# Patient Record
Sex: Female | Born: 1945
Health system: Southern US, Community
[De-identification: ages and names within clinical notes are randomized; demographics above are authoritative.]

## PROBLEM LIST (undated history)

## (undated) DIAGNOSIS — T7840XA Allergy, unspecified, initial encounter: Secondary | ICD-10-CM

## (undated) DIAGNOSIS — T783XXA Angioneurotic edema, initial encounter: Secondary | ICD-10-CM

## (undated) DIAGNOSIS — E039 Hypothyroidism, unspecified: Secondary | ICD-10-CM

## (undated) DIAGNOSIS — C499 Malignant neoplasm of connective and soft tissue, unspecified: Secondary | ICD-10-CM

## (undated) DIAGNOSIS — E785 Hyperlipidemia, unspecified: Secondary | ICD-10-CM

## (undated) DIAGNOSIS — M1990 Unspecified osteoarthritis, unspecified site: Secondary | ICD-10-CM

## (undated) DIAGNOSIS — H269 Unspecified cataract: Secondary | ICD-10-CM

## (undated) DIAGNOSIS — L509 Urticaria, unspecified: Secondary | ICD-10-CM

## (undated) DIAGNOSIS — I10 Essential (primary) hypertension: Secondary | ICD-10-CM

## (undated) DIAGNOSIS — J45909 Unspecified asthma, uncomplicated: Secondary | ICD-10-CM

## (undated) DIAGNOSIS — C4492 Squamous cell carcinoma of skin, unspecified: Secondary | ICD-10-CM

## (undated) HISTORY — DX: Malignant neoplasm of connective and soft tissue, unspecified: C49.9

## (undated) HISTORY — DX: Urticaria, unspecified: L50.9

## (undated) HISTORY — DX: Squamous cell carcinoma of skin, unspecified: C44.92

## (undated) HISTORY — DX: Hyperlipidemia, unspecified: E78.5

## (undated) HISTORY — PX: BREAST LUMPECTOMY: SHX2

## (undated) HISTORY — DX: Hypothyroidism, unspecified: E03.9

## (undated) HISTORY — PX: ABDOMINAL HYSTERECTOMY: SHX81

## (undated) HISTORY — DX: Allergy, unspecified, initial encounter: T78.40XA

## (undated) HISTORY — DX: Unspecified osteoarthritis, unspecified site: M19.90

## (undated) HISTORY — DX: Unspecified cataract: H26.9

## (undated) HISTORY — PX: SQUAMOUS CELL CARCINOMA EXCISION: SHX2433

## (undated) HISTORY — DX: Unspecified asthma, uncomplicated: J45.909

## (undated) HISTORY — PX: TONSILLECTOMY: SUR1361

## (undated) HISTORY — DX: Angioneurotic edema, initial encounter: T78.3XXA

## (undated) HISTORY — DX: Essential (primary) hypertension: I10

---

## 1970-03-06 DIAGNOSIS — C499 Malignant neoplasm of connective and soft tissue, unspecified: Secondary | ICD-10-CM

## 1970-03-06 HISTORY — DX: Malignant neoplasm of connective and soft tissue, unspecified: C49.9

## 1971-03-07 HISTORY — PX: OTHER SURGICAL HISTORY: SHX169

## 1998-07-20 ENCOUNTER — Inpatient Hospital Stay (HOSPITAL_COMMUNITY): Admission: EM | Admit: 1998-07-20 | Discharge: 1998-07-21 | Payer: Self-pay | Admitting: Emergency Medicine

## 1998-07-20 ENCOUNTER — Encounter: Payer: Self-pay | Admitting: Emergency Medicine

## 2001-02-20 ENCOUNTER — Ambulatory Visit (HOSPITAL_COMMUNITY): Admission: RE | Admit: 2001-02-20 | Discharge: 2001-02-20 | Payer: Self-pay | Admitting: Gastroenterology

## 2005-11-30 ENCOUNTER — Ambulatory Visit: Payer: Self-pay | Admitting: Family Medicine

## 2006-01-17 ENCOUNTER — Ambulatory Visit: Payer: Self-pay | Admitting: Family Medicine

## 2006-03-01 ENCOUNTER — Ambulatory Visit: Payer: Self-pay | Admitting: Family Medicine

## 2006-03-01 LAB — CONVERTED CEMR LAB
ALT: 27 units/L (ref 0–40)
AST: 29 units/L (ref 0–37)
BUN: 10 mg/dL (ref 6–23)
CO2: 31 meq/L (ref 19–32)
Calcium: 9.4 mg/dL (ref 8.4–10.5)
Chloride: 103 meq/L (ref 96–112)
Chol/HDL Ratio, serum: 2.7
Cholesterol: 180 mg/dL (ref 0–200)
Creatinine, Ser: 0.9 mg/dL (ref 0.4–1.2)
GFR calc non Af Amer: 68 mL/min
Glomerular Filtration Rate, Af Am: 82 mL/min/{1.73_m2}
Glucose, Bld: 94 mg/dL (ref 70–99)
HDL: 66.1 mg/dL (ref >39.0)
LDL Cholesterol: 98 mg/dL (ref 0–99)
Potassium: 3.9 meq/L (ref 3.5–5.1)
Sodium: 140 meq/L (ref 135–145)
TSH: 0.72 microintl units/mL (ref 0.35–5.50)
Triglyceride fasting, serum: 78 mg/dL (ref 0–149)
VLDL: 16 mg/dL (ref 0–40)

## 2006-06-07 ENCOUNTER — Encounter: Admission: RE | Admit: 2006-06-07 | Discharge: 2006-06-07 | Payer: Self-pay | Admitting: Family Medicine

## 2006-06-07 ENCOUNTER — Ambulatory Visit: Payer: Self-pay | Admitting: Family Medicine

## 2006-07-02 ENCOUNTER — Ambulatory Visit: Payer: Self-pay | Admitting: Family Medicine

## 2006-07-05 ENCOUNTER — Ambulatory Visit: Payer: Self-pay | Admitting: Family Medicine

## 2006-07-05 LAB — CONVERTED CEMR LAB
ALT: 17 units/L (ref 0–40)
AST: 23 units/L (ref 0–37)
BUN: 17 mg/dL (ref 6–23)
CO2: 31 meq/L (ref 19–32)
Calcium: 9.4 mg/dL (ref 8.4–10.5)
Chloride: 104 meq/L (ref 96–112)
Cholesterol: 161 mg/dL (ref 0–200)
Creatinine, Ser: 1 mg/dL (ref 0.4–1.2)
GFR calc Af Amer: 73 mL/min
GFR calc non Af Amer: 60 mL/min
Glucose, Bld: 89 mg/dL (ref 70–99)
HDL: 58.4 mg/dL (ref >39.0)
LDL Cholesterol: 90 mg/dL (ref 0–99)
Potassium: 3.7 meq/L (ref 3.5–5.1)
Sodium: 142 meq/L (ref 135–145)
Total CHOL/HDL Ratio: 2.8
Triglycerides: 64 mg/dL (ref 0–149)
VLDL: 13 mg/dL (ref 0–40)

## 2006-08-01 DIAGNOSIS — E039 Hypothyroidism, unspecified: Secondary | ICD-10-CM | POA: Insufficient documentation

## 2006-08-01 DIAGNOSIS — I10 Essential (primary) hypertension: Secondary | ICD-10-CM | POA: Insufficient documentation

## 2006-08-01 DIAGNOSIS — M810 Age-related osteoporosis without current pathological fracture: Secondary | ICD-10-CM | POA: Insufficient documentation

## 2006-08-01 DIAGNOSIS — E785 Hyperlipidemia, unspecified: Secondary | ICD-10-CM | POA: Insufficient documentation

## 2006-09-03 ENCOUNTER — Ambulatory Visit (HOSPITAL_BASED_OUTPATIENT_CLINIC_OR_DEPARTMENT_OTHER): Admission: RE | Admit: 2006-09-03 | Discharge: 2006-09-03 | Payer: Self-pay | Admitting: Surgery

## 2006-09-03 ENCOUNTER — Encounter (INDEPENDENT_AMBULATORY_CARE_PROVIDER_SITE_OTHER): Payer: Self-pay | Admitting: Surgery

## 2006-09-25 ENCOUNTER — Encounter (INDEPENDENT_AMBULATORY_CARE_PROVIDER_SITE_OTHER): Payer: Self-pay | Admitting: Family Medicine

## 2006-11-19 ENCOUNTER — Ambulatory Visit: Payer: Self-pay | Admitting: Family Medicine

## 2006-11-19 ENCOUNTER — Telehealth (INDEPENDENT_AMBULATORY_CARE_PROVIDER_SITE_OTHER): Payer: Self-pay | Admitting: *Deleted

## 2006-11-19 LAB — CONVERTED CEMR LAB
ALT: 21 units/L (ref 0–35)
AST: 23 units/L (ref 0–37)
BUN: 14 mg/dL (ref 6–23)
Basophils Absolute: 0 10*3/uL (ref 0.0–0.1)
Basophils Relative: 0.5 % (ref 0.0–1.0)
CO2: 32 meq/L (ref 19–32)
Calcium: 9.3 mg/dL (ref 8.4–10.5)
Chloride: 107 meq/L (ref 96–112)
Cholesterol: 160 mg/dL (ref 0–200)
Creatinine, Ser: 0.8 mg/dL (ref 0.4–1.2)
Eosinophils Absolute: 0.1 10*3/uL (ref 0.0–0.6)
Eosinophils Relative: 1.9 % (ref 0.0–5.0)
GFR calc Af Amer: 94 mL/min
GFR calc non Af Amer: 78 mL/min
Glucose, Bld: 93 mg/dL (ref 70–99)
HCT: 38.9 % (ref 36.0–46.0)
HDL: 56.5 mg/dL (ref >39.0)
Hemoglobin: 13.1 g/dL (ref 12.0–15.0)
LDL Cholesterol: 94 mg/dL (ref 0–99)
Lymphocytes Relative: 27.5 % (ref 12.0–46.0)
MCHC: 33.7 g/dL (ref 30.0–36.0)
MCV: 91 fL (ref 78.0–100.0)
Monocytes Absolute: 0.4 10*3/uL (ref 0.2–0.7)
Monocytes Relative: 6.3 % (ref 3.0–11.0)
Neutro Abs: 4.5 10*3/uL (ref 1.4–7.7)
Neutrophils Relative %: 63.8 % (ref 43.0–77.0)
Platelets: 246 10*3/uL (ref 150–400)
Potassium: 3.9 meq/L (ref 3.5–5.1)
RBC: 4.27 M/uL (ref 3.87–5.11)
RDW: 11.9 % (ref 11.5–14.6)
Sodium: 143 meq/L (ref 135–145)
TSH: 0.53 microintl units/mL (ref 0.35–5.50)
Total CHOL/HDL Ratio: 2.8
Triglycerides: 47 mg/dL (ref 0–149)
VLDL: 9 mg/dL (ref 0–40)
WBC: 6.9 10*3/uL (ref 4.5–10.5)

## 2006-11-28 ENCOUNTER — Ambulatory Visit: Payer: Self-pay | Admitting: Family Medicine

## 2006-11-29 ENCOUNTER — Telehealth (INDEPENDENT_AMBULATORY_CARE_PROVIDER_SITE_OTHER): Payer: Self-pay | Admitting: *Deleted

## 2006-12-05 ENCOUNTER — Encounter (INDEPENDENT_AMBULATORY_CARE_PROVIDER_SITE_OTHER): Payer: Self-pay | Admitting: Family Medicine

## 2007-01-07 ENCOUNTER — Ambulatory Visit: Payer: Self-pay | Admitting: Family Medicine

## 2007-02-07 ENCOUNTER — Ambulatory Visit: Payer: Self-pay | Admitting: Family Medicine

## 2007-02-25 ENCOUNTER — Ambulatory Visit: Payer: Self-pay | Admitting: Family Medicine

## 2007-02-25 LAB — CONVERTED CEMR LAB
ALT: 22 U/L
AST: 26 U/L
BUN: 12 mg/dL
CO2: 32 meq/L
Calcium: 9.7 mg/dL
Chloride: 102 meq/L
Creatinine, Ser: 0.8 mg/dL
GFR calc Af Amer: 94 mL/min
GFR calc non Af Amer: 78 mL/min
Glucose, Bld: 94 mg/dL
Potassium: 5.2 meq/L — ABNORMAL HIGH
Sodium: 143 meq/L
TSH: 0.58 u[IU]/mL

## 2007-06-10 ENCOUNTER — Telehealth (INDEPENDENT_AMBULATORY_CARE_PROVIDER_SITE_OTHER): Payer: Self-pay | Admitting: *Deleted

## 2007-06-27 ENCOUNTER — Ambulatory Visit: Payer: Self-pay | Admitting: Family Medicine

## 2007-06-27 DIAGNOSIS — D492 Neoplasm of unspecified behavior of bone, soft tissue, and skin: Secondary | ICD-10-CM | POA: Insufficient documentation

## 2007-07-16 ENCOUNTER — Encounter: Payer: Self-pay | Admitting: Family Medicine

## 2007-08-27 ENCOUNTER — Ambulatory Visit: Payer: Self-pay | Admitting: Family Medicine

## 2007-08-28 LAB — CONVERTED CEMR LAB
ALT: 24 units/L (ref 0–35)
AST: 29 units/L (ref 0–37)
Albumin: 4.4 g/dL (ref 3.5–5.2)
Alkaline Phosphatase: 47 units/L (ref 39–117)
BUN: 14 mg/dL (ref 6–23)
Bilirubin, Direct: 0.1 mg/dL (ref 0.0–0.3)
CO2: 31 meq/L (ref 19–32)
CRP, High Sensitivity: 1 (ref 0.00–5.00)
Calcium: 9.2 mg/dL (ref 8.4–10.5)
Chloride: 101 meq/L (ref 96–112)
Cholesterol: 167 mg/dL (ref 0–200)
Creatinine, Ser: 0.9 mg/dL (ref 0.4–1.2)
GFR calc Af Amer: 82 mL/min
GFR calc non Af Amer: 68 mL/min
Glucose, Bld: 85 mg/dL (ref 70–99)
HDL: 61.7 mg/dL (ref >39.0)
LDL Cholesterol: 92 mg/dL (ref 0–99)
Potassium: 3.6 meq/L (ref 3.5–5.1)
Sodium: 141 meq/L (ref 135–145)
TSH: 0.43 microintl units/mL (ref 0.35–5.50)
Total Bilirubin: 1.2 mg/dL (ref 0.3–1.2)
Total CHOL/HDL Ratio: 2.7
Total Protein: 7.1 g/dL (ref 6.0–8.3)
Triglycerides: 65 mg/dL (ref 0–149)
VLDL: 13 mg/dL (ref 0–40)

## 2007-08-29 ENCOUNTER — Encounter (INDEPENDENT_AMBULATORY_CARE_PROVIDER_SITE_OTHER): Payer: Self-pay | Admitting: *Deleted

## 2007-12-06 ENCOUNTER — Encounter: Payer: Self-pay | Admitting: Family Medicine

## 2007-12-26 ENCOUNTER — Encounter: Payer: Self-pay | Admitting: Family Medicine

## 2007-12-27 ENCOUNTER — Encounter: Payer: Self-pay | Admitting: Family Medicine

## 2007-12-27 ENCOUNTER — Other Ambulatory Visit: Admission: RE | Admit: 2007-12-27 | Discharge: 2007-12-27 | Payer: Self-pay | Admitting: Family Medicine

## 2007-12-27 ENCOUNTER — Ambulatory Visit: Payer: Self-pay | Admitting: Family Medicine

## 2007-12-30 LAB — CONVERTED CEMR LAB
ALT: 19 units/L (ref 0–35)
AST: 27 units/L (ref 0–37)
Albumin: 4.2 g/dL (ref 3.5–5.2)
Alkaline Phosphatase: 47 units/L (ref 39–117)
BUN: 14 mg/dL (ref 6–23)
Basophils Absolute: 0 10*3/uL (ref 0.0–0.1)
Basophils Relative: 0.4 % (ref 0.0–3.0)
Bilirubin, Direct: 0.1 mg/dL (ref 0.0–0.3)
CO2: 33 meq/L — ABNORMAL HIGH (ref 19–32)
Calcium: 9.2 mg/dL (ref 8.4–10.5)
Chloride: 102 meq/L (ref 96–112)
Cholesterol: 165 mg/dL (ref 0–200)
Creatinine, Ser: 0.8 mg/dL (ref 0.4–1.2)
Eosinophils Absolute: 0 10*3/uL (ref 0.0–0.7)
Eosinophils Relative: 0.5 % (ref 0.0–5.0)
Free T4: 1.1 ng/dL (ref 0.6–1.6)
GFR calc Af Amer: 93 mL/min
GFR calc non Af Amer: 77 mL/min
Glucose, Bld: 92 mg/dL (ref 70–99)
HCT: 40.2 % (ref 36.0–46.0)
HDL: 54.7 mg/dL (ref >39.0)
Hemoglobin: 14.1 g/dL (ref 12.0–15.0)
LDL Cholesterol: 95 mg/dL (ref 0–99)
Lymphocytes Relative: 25.9 % (ref 12.0–46.0)
MCHC: 35.1 g/dL (ref 30.0–36.0)
MCV: 91.8 fL (ref 78.0–100.0)
Monocytes Absolute: 0.5 10*3/uL (ref 0.1–1.0)
Monocytes Relative: 6.4 % (ref 3.0–12.0)
Neutro Abs: 5.3 10*3/uL (ref 1.4–7.7)
Neutrophils Relative %: 66.8 % (ref 43.0–77.0)
Platelets: 264 10*3/uL (ref 150–400)
Potassium: 4 meq/L (ref 3.5–5.1)
RBC: 4.38 M/uL (ref 3.87–5.11)
RDW: 12.3 % (ref 11.5–14.6)
Sodium: 142 meq/L (ref 135–145)
T3, Free: 3.2 pg/mL (ref 2.3–4.2)
TSH: 0.44 microintl units/mL (ref 0.35–5.50)
Total Bilirubin: 1 mg/dL (ref 0.3–1.2)
Total CHOL/HDL Ratio: 3
Total Protein: 7.2 g/dL (ref 6.0–8.3)
Triglycerides: 76 mg/dL (ref 0–149)
VLDL: 15 mg/dL (ref 0–40)
Vit D, 1,25-Dihydroxy: 36 (ref 30–89)
WBC: 7.8 10*3/uL (ref 4.5–10.5)

## 2007-12-31 ENCOUNTER — Encounter (INDEPENDENT_AMBULATORY_CARE_PROVIDER_SITE_OTHER): Payer: Self-pay | Admitting: *Deleted

## 2008-01-02 ENCOUNTER — Encounter (INDEPENDENT_AMBULATORY_CARE_PROVIDER_SITE_OTHER): Payer: Self-pay | Admitting: *Deleted

## 2008-01-09 ENCOUNTER — Telehealth: Payer: Self-pay | Admitting: Family Medicine

## 2008-01-15 ENCOUNTER — Encounter: Payer: Self-pay | Admitting: Family Medicine

## 2008-01-24 ENCOUNTER — Telehealth (INDEPENDENT_AMBULATORY_CARE_PROVIDER_SITE_OTHER): Payer: Self-pay | Admitting: *Deleted

## 2008-01-31 ENCOUNTER — Ambulatory Visit: Payer: Self-pay | Admitting: Family Medicine

## 2008-02-07 ENCOUNTER — Encounter: Payer: Self-pay | Admitting: Family Medicine

## 2008-02-12 ENCOUNTER — Telehealth (INDEPENDENT_AMBULATORY_CARE_PROVIDER_SITE_OTHER): Payer: Self-pay | Admitting: *Deleted

## 2008-02-17 ENCOUNTER — Ambulatory Visit: Payer: Self-pay | Admitting: Family Medicine

## 2008-02-17 LAB — CONVERTED CEMR LAB
Calcium: 9.5 mg/dL (ref 8.4–10.5)
Creatinine, Ser: 0.8 mg/dL (ref 0.4–1.2)
Magnesium: 2.2 mg/dL (ref 1.5–2.5)
Phosphorus: 4.2 mg/dL (ref 2.3–4.6)

## 2008-02-18 ENCOUNTER — Encounter: Payer: Self-pay | Admitting: Family Medicine

## 2008-03-05 ENCOUNTER — Ambulatory Visit (HOSPITAL_COMMUNITY): Admission: RE | Admit: 2008-03-05 | Discharge: 2008-03-05 | Payer: Self-pay | Admitting: Family Medicine

## 2008-03-24 ENCOUNTER — Telehealth (INDEPENDENT_AMBULATORY_CARE_PROVIDER_SITE_OTHER): Payer: Self-pay | Admitting: *Deleted

## 2008-03-25 ENCOUNTER — Telehealth (INDEPENDENT_AMBULATORY_CARE_PROVIDER_SITE_OTHER): Payer: Self-pay | Admitting: *Deleted

## 2008-04-06 ENCOUNTER — Encounter (INDEPENDENT_AMBULATORY_CARE_PROVIDER_SITE_OTHER): Payer: Self-pay | Admitting: *Deleted

## 2008-06-29 ENCOUNTER — Ambulatory Visit: Payer: Self-pay | Admitting: Family Medicine

## 2008-06-30 ENCOUNTER — Encounter (INDEPENDENT_AMBULATORY_CARE_PROVIDER_SITE_OTHER): Payer: Self-pay | Admitting: *Deleted

## 2008-06-30 LAB — CONVERTED CEMR LAB
ALT: 21 units/L (ref 0–35)
AST: 24 units/L (ref 0–37)
Albumin: 3.9 g/dL (ref 3.5–5.2)
Calcium: 9.2 mg/dL (ref 8.4–10.5)
Cholesterol: 200 mg/dL (ref 0–200)
GFR calc non Af Amer: 67.25 mL/min (ref >60)
HDL: 60.9 mg/dL (ref >39.00)
Sodium: 144 meq/L (ref 135–145)
Triglycerides: 85 mg/dL (ref 0.0–149.0)

## 2008-07-04 ENCOUNTER — Encounter: Payer: Self-pay | Admitting: Family Medicine

## 2008-07-06 ENCOUNTER — Telehealth (INDEPENDENT_AMBULATORY_CARE_PROVIDER_SITE_OTHER): Payer: Self-pay | Admitting: *Deleted

## 2008-09-21 ENCOUNTER — Telehealth (INDEPENDENT_AMBULATORY_CARE_PROVIDER_SITE_OTHER): Payer: Self-pay | Admitting: *Deleted

## 2008-12-07 ENCOUNTER — Encounter: Payer: Self-pay | Admitting: Family Medicine

## 2008-12-28 ENCOUNTER — Ambulatory Visit: Payer: Self-pay | Admitting: Family Medicine

## 2008-12-31 ENCOUNTER — Encounter (INDEPENDENT_AMBULATORY_CARE_PROVIDER_SITE_OTHER): Payer: Self-pay | Admitting: *Deleted

## 2008-12-31 LAB — CONVERTED CEMR LAB
ALT: 24 units/L (ref 0–35)
AST: 24 units/L (ref 0–37)
Alkaline Phosphatase: 51 units/L (ref 39–117)
BUN: 14 mg/dL (ref 6–23)
Bilirubin, Direct: 0 mg/dL (ref 0.0–0.3)
Calcium: 9.5 mg/dL (ref 8.4–10.5)
Cholesterol: 163 mg/dL (ref 0–200)
Eosinophils Relative: 0.8 % (ref 0.0–5.0)
GFR calc non Af Amer: 67.14 mL/min (ref >60)
HCT: 42.4 % (ref 36.0–46.0)
LDL Cholesterol: 95 mg/dL (ref 0–99)
Lymphocytes Relative: 24.6 % (ref 12.0–46.0)
Lymphs Abs: 1.6 10*3/uL (ref 0.7–4.0)
Monocytes Relative: 5.8 % (ref 3.0–12.0)
Platelets: 250 10*3/uL (ref 150.0–400.0)
Potassium: 4.1 meq/L (ref 3.5–5.1)
Sodium: 142 meq/L (ref 135–145)
Total Bilirubin: 1 mg/dL (ref 0.3–1.2)
Total CHOL/HDL Ratio: 3
VLDL: 11.6 mg/dL (ref 0.0–40.0)
WBC: 6.6 10*3/uL (ref 4.5–10.5)

## 2009-01-05 ENCOUNTER — Ambulatory Visit: Payer: Self-pay | Admitting: Family Medicine

## 2009-01-05 LAB — CONVERTED CEMR LAB
OCCULT 1: NEGATIVE
OCCULT 2: NEGATIVE
OCCULT 3: NEGATIVE

## 2009-01-06 ENCOUNTER — Encounter (INDEPENDENT_AMBULATORY_CARE_PROVIDER_SITE_OTHER): Payer: Self-pay | Admitting: *Deleted

## 2009-01-13 ENCOUNTER — Telehealth (INDEPENDENT_AMBULATORY_CARE_PROVIDER_SITE_OTHER): Payer: Self-pay | Admitting: *Deleted

## 2009-02-04 ENCOUNTER — Ambulatory Visit: Payer: Self-pay | Admitting: Family Medicine

## 2009-02-05 LAB — CONVERTED CEMR LAB
BUN: 14 mg/dL (ref 6–23)
Calcium: 9.2 mg/dL (ref 8.4–10.5)
Creatinine, Ser: 0.8 mg/dL (ref 0.4–1.2)
GFR calc non Af Amer: 76.89 mL/min (ref >60)

## 2009-02-08 ENCOUNTER — Encounter: Payer: Self-pay | Admitting: Family Medicine

## 2009-02-12 ENCOUNTER — Encounter: Payer: Self-pay | Admitting: Family Medicine

## 2009-02-18 ENCOUNTER — Ambulatory Visit (HOSPITAL_COMMUNITY): Admission: RE | Admit: 2009-02-18 | Discharge: 2009-02-18 | Payer: Self-pay | Admitting: Family Medicine

## 2009-02-18 ENCOUNTER — Encounter: Payer: Self-pay | Admitting: Internal Medicine

## 2009-03-16 ENCOUNTER — Telehealth (INDEPENDENT_AMBULATORY_CARE_PROVIDER_SITE_OTHER): Payer: Self-pay | Admitting: *Deleted

## 2009-03-22 ENCOUNTER — Telehealth (INDEPENDENT_AMBULATORY_CARE_PROVIDER_SITE_OTHER): Payer: Self-pay | Admitting: *Deleted

## 2009-03-23 ENCOUNTER — Encounter: Payer: Self-pay | Admitting: Family Medicine

## 2009-05-12 ENCOUNTER — Encounter: Payer: Self-pay | Admitting: Family Medicine

## 2009-06-09 ENCOUNTER — Encounter: Payer: Self-pay | Admitting: Family Medicine

## 2009-06-30 ENCOUNTER — Ambulatory Visit: Payer: Self-pay | Admitting: Family Medicine

## 2009-06-30 LAB — CONVERTED CEMR LAB
AST: 27 units/L (ref 0–37)
Albumin: 4.1 g/dL (ref 3.5–5.2)
Alkaline Phosphatase: 45 units/L (ref 39–117)
Bilirubin, Direct: 0 mg/dL (ref 0.0–0.3)
Cholesterol: 179 mg/dL (ref 0–200)
Total Protein: 6.8 g/dL (ref 6.0–8.3)
Triglycerides: 57 mg/dL (ref 0.0–149.0)

## 2009-08-09 ENCOUNTER — Telehealth (INDEPENDENT_AMBULATORY_CARE_PROVIDER_SITE_OTHER): Payer: Self-pay | Admitting: *Deleted

## 2009-09-13 ENCOUNTER — Telehealth (INDEPENDENT_AMBULATORY_CARE_PROVIDER_SITE_OTHER): Payer: Self-pay | Admitting: *Deleted

## 2009-12-10 ENCOUNTER — Encounter: Payer: Self-pay | Admitting: Family Medicine

## 2009-12-14 ENCOUNTER — Encounter (INDEPENDENT_AMBULATORY_CARE_PROVIDER_SITE_OTHER): Payer: Self-pay | Admitting: *Deleted

## 2009-12-22 ENCOUNTER — Ambulatory Visit: Payer: Self-pay | Admitting: Family Medicine

## 2009-12-22 LAB — CONVERTED CEMR LAB
Basophils Relative: 0.4 % (ref 0.0–3.0)
Bilirubin, Direct: 0.1 mg/dL (ref 0.0–0.3)
CO2: 31 meq/L (ref 19–32)
Calcium: 9.4 mg/dL (ref 8.4–10.5)
Creatinine, Ser: 0.8 mg/dL (ref 0.4–1.2)
Eosinophils Absolute: 0 10*3/uL (ref 0.0–0.7)
Eosinophils Relative: 0.5 % (ref 0.0–5.0)
HCT: 42.8 % (ref 36.0–46.0)
HDL: 62.7 mg/dL (ref >39.00)
Hemoglobin: 14.4 g/dL (ref 12.0–15.0)
Ketones, urine, test strip: NEGATIVE
LDL Cholesterol: 113 mg/dL — ABNORMAL HIGH (ref 0–99)
Lymphs Abs: 1.7 10*3/uL (ref 0.7–4.0)
MCHC: 33.5 g/dL (ref 30.0–36.0)
MCV: 92.5 fL (ref 78.0–100.0)
Monocytes Absolute: 0.4 10*3/uL (ref 0.1–1.0)
Neutro Abs: 7.5 10*3/uL (ref 1.4–7.7)
Nitrite: NEGATIVE
RBC: 4.63 M/uL (ref 3.87–5.11)
Sodium: 141 meq/L (ref 135–145)
Specific Gravity, Urine: <1.005
TSH: 0.64 microintl units/mL (ref 0.35–5.50)
Total Bilirubin: 0.6 mg/dL (ref 0.3–1.2)
Total CHOL/HDL Ratio: 3
Triglycerides: 78 mg/dL (ref 0.0–149.0)
Urobilinogen, UA: 0.2
WBC Urine, dipstick: NEGATIVE
WBC: 9.7 10*3/uL (ref 4.5–10.5)

## 2009-12-29 ENCOUNTER — Ambulatory Visit: Payer: Self-pay | Admitting: Family Medicine

## 2010-01-03 ENCOUNTER — Encounter: Payer: Self-pay | Admitting: Family Medicine

## 2010-01-10 ENCOUNTER — Encounter (INDEPENDENT_AMBULATORY_CARE_PROVIDER_SITE_OTHER): Payer: Self-pay | Admitting: *Deleted

## 2010-01-12 ENCOUNTER — Encounter: Payer: Self-pay | Admitting: Family Medicine

## 2010-01-17 ENCOUNTER — Encounter: Payer: Self-pay | Admitting: Family Medicine

## 2010-01-25 ENCOUNTER — Telehealth (INDEPENDENT_AMBULATORY_CARE_PROVIDER_SITE_OTHER): Payer: Self-pay | Admitting: *Deleted

## 2010-01-31 ENCOUNTER — Telehealth (INDEPENDENT_AMBULATORY_CARE_PROVIDER_SITE_OTHER): Payer: Self-pay | Admitting: *Deleted

## 2010-02-07 ENCOUNTER — Telehealth (INDEPENDENT_AMBULATORY_CARE_PROVIDER_SITE_OTHER): Payer: Self-pay | Admitting: *Deleted

## 2010-02-08 ENCOUNTER — Ambulatory Visit: Payer: Self-pay | Admitting: Family Medicine

## 2010-02-08 LAB — CONVERTED CEMR LAB: Creatinine, Ser: 0.8 mg/dL (ref 0.4–1.2)

## 2010-02-09 ENCOUNTER — Encounter: Payer: Self-pay | Admitting: Family Medicine

## 2010-02-10 ENCOUNTER — Telehealth (INDEPENDENT_AMBULATORY_CARE_PROVIDER_SITE_OTHER): Payer: Self-pay | Admitting: *Deleted

## 2010-02-14 ENCOUNTER — Ambulatory Visit (HOSPITAL_COMMUNITY)
Admission: RE | Admit: 2010-02-14 | Discharge: 2010-02-14 | Payer: Self-pay | Source: Home / Self Care | Attending: Family Medicine | Admitting: Family Medicine

## 2010-02-14 ENCOUNTER — Encounter: Payer: Self-pay | Admitting: Family Medicine

## 2010-02-15 ENCOUNTER — Encounter: Payer: Self-pay | Admitting: Family Medicine

## 2010-02-18 ENCOUNTER — Telehealth (INDEPENDENT_AMBULATORY_CARE_PROVIDER_SITE_OTHER): Payer: Self-pay | Admitting: *Deleted

## 2010-03-03 ENCOUNTER — Ambulatory Visit
Admission: RE | Admit: 2010-03-03 | Discharge: 2010-03-03 | Payer: Self-pay | Source: Home / Self Care | Attending: Family Medicine | Admitting: Family Medicine

## 2010-03-09 ENCOUNTER — Telehealth: Payer: Self-pay | Admitting: Family Medicine

## 2010-03-23 ENCOUNTER — Telehealth (INDEPENDENT_AMBULATORY_CARE_PROVIDER_SITE_OTHER): Payer: Self-pay | Admitting: *Deleted

## 2010-03-23 ENCOUNTER — Telehealth: Payer: Self-pay | Admitting: Family Medicine

## 2010-04-07 NOTE — Progress Notes (Signed)
Summary: FYI FYI reaction to med  Phone Note Refill Request   Refills Requested: Medication #1:  AVELOX 400 MG TABS Take 1 tab daily x10 days. Pt states that med cause her to have pain in shoulder and knee. Pt also c/o bruising in ankle. Pt took 6 days of avelox then D/C and took remain amox. Pt just wanted to inform us of her reaction................Marland KitchenFelecia Deloach CMA  March 23, 2010 2:32 PM    Follow-up for Phone Call        noted Follow-up by: Neena Rhymes MD,  March 23, 2010 2:48 PM   New Allergies: ! * AVELOX New Allergies: ! * AVELOX

## 2010-04-07 NOTE — Miscellaneous (Signed)
Summary: colonoscopy   Clinical Lists Changes  Observations: Added new observation of COLONOSCOPY:  Results: Normal. Location:  [facility].    (01/03/2010 16:33)        Colonoscopy  Procedure date:  01/03/2010  Findings:       Results: Normal. Location:  [facility].

## 2010-04-07 NOTE — Assessment & Plan Note (Signed)
Summary: cpx-lab/cbs   Vital Signs:  Patient profile:   65 year old female Height:      60.25 inches Weight:      106 pounds BMI:     20.60 Pulse rate:   51 / minute BP sitting:   124 / Misty  (left arm)  Vitals Entered By: Doristine Devoid CMA (December 29, 2009 8:39 AM) CC: CPX   History of Present Illness: 65 yo [name] here today for CPE.  no concerns today.  UTD on mammogram.  DEXA scheduled for 12/11.  1) HTN- initially elevated today but then improved w/ relaxing.  doing well on Norvasc daily.  no longer dizzy w/ position changes.  no CP, SOB, HAs, visual changes, edema.  2) Hyperlipidemia- on lipitor, no N/V, abd pain, myalgias.  labs look good.  3) Hypothyroid- denies fatigue, dry or brittle nails.  TSH normal on recent labs.  Preventive Screening-Counseling & Management  Alcohol-Tobacco     Alcohol drinks/day: 1     Alcohol type: wine     Smoking Status: never  Caffeine-Diet-Exercise     Does Patient Exercise: yes      Sexual History:  currently monogamous.        Drug Use:  never.    Current Medications (verified): 1)  Lipitor 20 Mg Tabs (Atorvastatin Calcium) .... Take One Tablet At Bedtime 2)  Amlodipine Besylate 10 Mg Tabs (Amlodipine Besylate) .... Take 1/2 Tablet By Mouth Once A Day 3)  Restasis 0.05 %  Emul (Cyclosporine) 4)  Adult Aspirin Ec Low Strength 81 Mg  Tbec (Aspirin) 5)  Multivitamin/iron 60 Mg  Chew (Pediatric Multivit-Minerals) 6)  Co Q-10 Vitamin E Fish Oil 60-90-25-200  Caps (Dha-Epa-Coenzyme Q10-Vitamin E) 7)  Calcium + D 315-200 Mg-Unit  Tabs (Calcium Citrate-Vitamin D) .... 4 Tabs Daily 8)  Levoxyl 50 Mcg  Tabs (Levothyroxine Sodium) .... Take One Tablet Daily 9)  Reclast 5 Mg/140ml Soln (Zoledronic Acid)  Allergies (verified): No Known Drug Allergies  Past History:  Past medical, surgical, family and social histories (including risk factors) reviewed, and no changes noted (except as noted below).  Past Medical History: Reviewed  history from 12/27/2007 and no changes required. Hyperlipidemia Hypertension Osteoporosis Hypothyroidism  Past Surgical History: Reviewed history from 11/19/2006 and no changes required. Hysterectomy secondary to prolapse Lumpectomy x2 left breast Tonsillectomy liposacroma Squamous cell of right lower leg with wide excision  Family History: Reviewed history from 12/27/2007 and no changes required. Congenital heart defect: mother kidney cancer--F Maunt--breast cancer Fanunt--breast cancer Mother died when pt was 81 yo ---congental heart defect Family History Osteoporosis  Social History: Reviewed history from 12/27/2007 and no changes required. Retired Never Smoked Alcohol use-no Regular exercise-yes Drug use-no  Review of Systems  The patient denies anorexia, fever, weight loss, weight gain, vision loss, decreased hearing, hoarseness, chest pain, syncope, dyspnea on exertion, peripheral edema, prolonged cough, headaches, abdominal pain, melena, hematochezia, severe indigestion/heartburn, hematuria, suspicious skin lesions, depression, abnormal bleeding, enlarged lymph nodes, and breast masses.    Physical Exam  General:  Well-developed,well-nourished,in no acute distress; alert,appropriate and cooperative throughout examination Head:  Normocephalic and atraumatic without obvious abnormalities. No apparent alopecia or balding. Eyes:  No corneal or conjunctival inflammation noted. EOMI. Perrla. Funduscopic exam benign, without hemorrhages, exudates or papilledema. Vision grossly normal. Ears:  External ear exam shows no significant lesions or deformities.  Otoscopic examination reveals clear canals, tympanic membranes are intact bilaterally without bulging, retraction, inflammation or discharge. Hearing is grossly normal bilaterally. Nose:  External nasal examination shows no deformity or inflammation. Nasal mucosa are pink and moist without lesions or exudates. Mouth:  Oral  mucosa and oropharynx without lesions or exudates.  Teeth in good repair. Neck:  No deformities, masses, or tenderness noted. Breasts:  No mass, nodules, thickening, tenderness, bulging, retraction, inflamation, nipple discharge or skin changes noted.   Lungs:  Normal respiratory effort, chest expands symmetrically. Lungs are clear to auscultation, no crackles or wheezes. Heart:  Normal rate and regular rhythm. S1 and S2 normal without gallop, murmur, click, rub or other extra sounds. Abdomen:  soft, NT/ND, +BS Genitalia:  Pelvic Exam:        External: normal female genitalia without lesions or masses        Vagina: normal without lesions or masses        Cervix: normal without lesions or masses        Adnexa: normal bimanual exam without masses or fullness        Pap smear: not performed Pulses:  +2 carotid, radial, DP Extremities:  No clubbing, cyanosis, edema, or deformity noted with normal full range of motion of all joints.   Neurologic:  No cranial nerve deficits noted. Station and gait are normal. Plantar reflexes are down-going bilaterally. DTRs are symmetrical throughout. Sensory, motor and coordinative functions appear intact. Skin:  Intact without suspicious lesions or rashes Cervical Nodes:  No lymphadenopathy noted Axillary Nodes:  No palpable lymphadenopathy Psych:  Cognition and judgment appear intact. Alert and cooperative with normal attention span and concentration. No apparent delusions, illusions, hallucinations   Impression & Recommendations:  Problem # 1:  PREVENTIVE HEALTH CARE (ICD-V70.0) Assessment Unchanged pt's PE WNL.  labs reviewed and discussed w/ pt.  UTD on health maintainence.  Problem # 2:  HYPERTENSION (ICD-401.9) Assessment: Unchanged well controlled.  asymptomatic.  no changes. Her updated medication list for this problem includes:    Amlodipine Besylate 10 Mg Tabs (Amlodipine besylate) .Marland Kitchen... Take 1/2 tablet by mouth once a day  Problem # 3:   HYPERLIPIDEMIA (ICD-272.4) Assessment: Unchanged reviewed labs.  no changes required. Her updated medication list for this problem includes:    Lipitor 20 Mg Tabs (Atorvastatin calcium) .Marland Kitchen... Take one tablet at bedtime  Problem # 4:  HYPOTHYROIDISM (ICD-244.9) Assessment: Unchanged labs good.  continue current meds. Her updated medication list for this problem includes:    Levoxyl 50 Mcg Tabs (Levothyroxine sodium) .Marland Kitchen... Take one tablet daily  Complete Medication List: 1)  Lipitor 20 Mg Tabs (Atorvastatin calcium) .... Take one tablet at bedtime 2)  Amlodipine Besylate 10 Mg Tabs (Amlodipine besylate) .... Take 1/2 tablet by mouth once a day 3)  Restasis 0.05 % Emul (Cyclosporine) 4)  Adult Aspirin Ec Low Strength 81 Mg Tbec (Aspirin) 5)  Multivitamin/iron 60 Mg Chew (Pediatric multivit-minerals) 6)  Co Q-10 Vitamin E Fish Oil 60-90-25-200 Caps (Dha-epa-coenzyme q10-vitamin e) 7)  Calcium + D 315-200 Mg-unit Tabs (Calcium citrate-vitamin d) .... 4 tabs daily 8)  Levoxyl 50 Mcg Tabs (Levothyroxine sodium) .... Take one tablet daily 9)  Reclast 5 Mg/129ml Soln (Zoledronic acid)  Patient Instructions: 1)  Follow up in 6 months to recheck BP, cholesterol, and thyroid 2)  Keep up the good work!  Your exam looks great! 3)  Call with any questions or concerns 4)  Have a great holiday season!   Orders Added: 1)  Est. Patient 40-64 years [99396] 2)  Est. Patient Level III [99213]   Immunization History:  Influenza Immunization History:  Influenza:  historical (11/04/2009)   Immunization History:  Influenza Immunization History:    Influenza:  Historical (11/04/2009)

## 2010-04-07 NOTE — Consult Note (Signed)
Summary: [facility]  [facility]   Imported By: Lanelle Bal 05/20/2009 10:47:14  _____________________________________________________________________  External Attachment:    Type:   Image     Comment:   External Document

## 2010-04-07 NOTE — Letter (Signed)
Summary: [facility]  [facility]   Imported By: Lanelle Bal 01/05/2010 11:39:17  _____________________________________________________________________  External Attachment:    Type:   Image     Comment:   External Document

## 2010-04-07 NOTE — Letter (Signed)
Summary: [facility] Scre[facility] Profile  Cancer Scre[facility]   Imported By: Lanelle Bal 01/11/2010 11:35:06  _____________________________________________________________________  External Attachment:    Type:   Image     Comment:   External Document

## 2010-04-07 NOTE — Progress Notes (Signed)
Summary: bone density results-  Phone Note Outgoing Call   Call placed by: Doristine Devoid CMA,  January 25, 2010 2:11 PM Call placed to: Patient Summary of Call: contacted patient recieved bone density results patient is stable but remains borderline osteoporotic should continue calcium +D daily per Dr. Beverely Low.  Follow-up for Phone Call        left message on machine .......Marland KitchenDoristine Devoid CMA  January 25, 2010 2:13 PM   Pt aware of result and will continue with reclast as well. Per chemira will contact patient

## 2010-04-07 NOTE — Assessment & Plan Note (Signed)
Summary: 6 MONTH ROA/CDJ/pt rescd//ccm   Vital Signs:  Patient profile:   65 year old female Height:      60.25 inches Weight:      102.25 pounds Pulse rate:   58 / minute BP sitting:   160 / 70  Vitals Entered By: Kandice Hams (June 30, 2009 9:31 AM) CC: 6 mos followup recheck BP and cholesterol,  pt is fasting   History of Present Illness: 65 yo woman here today for  1) HTN- BP excellently controlled everywhere but here in office.  at dentist this week BP was 120/60.  at the [facility] BPs range 103-116/51-61.  no CP, SOB, HAs, visual changes, edema.  taking 25mg  Atenolol and 5 mg Amlodipine.  would like to stop meds.    2) Hyperlipidemia- would like to stop Lipitor if possible.  no N/V, myalgias.  3) Hypothyroid- needs refill.  has been stable on dose for 'years'.  last TSH in 10/10 was WNL.  Problems Prior to Update: 1)  Family History Osteoporosis  (ICD-V17.8) 2)  Preventive Health Care  (ICD-V70.0) 3)  Neoplasms Unspec Nature Bone Soft Tissue&skin  (ICD-239.2) 4)  Well Adult Exam  (ICD-V70.0) 5)  Hypothyroidism  (ICD-244.9) 6)  Osteoporosis  (ICD-733.00) 7)  Hypertension  (ICD-401.9) 8)  Hyperlipidemia  (ICD-272.4)  Allergies (verified): No Known Drug Allergies  Past History:  Past Medical History: Last updated: 12/27/2007 Hyperlipidemia Hypertension Osteoporosis Hypothyroidism  Review of Systems      See HPI  Physical Exam  General:  Well-developed,well-nourished,in no acute distress; alert,appropriate and cooperative throughout examination Head:  Normocephalic and atraumatic without obvious abnormalities. No apparent alopecia or balding. Neck:  No deformities, masses, or tenderness noted. Lungs:  Normal respiratory effort, chest expands symmetrically. Lungs are clear to auscultation, no crackles or wheezes. Heart:  Normal rate and regular rhythm. S1 and S2 normal without gallop, murmur, click, rub or other extra sounds. Abdomen:  soft, NT/ND,  +BS   Impression & Recommendations:  Problem # 1:  HYPERTENSION (ICD-401.9) Assessment Unchanged BP is high in office but [facility].  pt would like to wean off meds.  will stop Atenolol as pt has only been taking 25mg .  pt knows to monitor BP and notify me if readings are persistently >140/90 The following medications were removed from the medication list:    Atenolol 50 Mg Tabs (Atenolol) .Marland Kitchen... Take one tablet daily - keep appt in october Her updated medication list for this problem includes:    Amlodipine Besylate 10 Mg Tabs (Amlodipine besylate) .Marland Kitchen... Take 1/2 tablet by mouth once a day  Problem # 2:  HYPERLIPIDEMIA (ICD-272.4) Assessment: Unchanged due for labs.  depending on values will decrease or stop statin if possible based on pt's desire to stop majority of meds. Her updated medication list for this problem includes:    Lipitor 20 Mg Tabs (Atorvastatin calcium) .Marland Kitchen... Take one tablet at bedtime  Orders: Venipuncture (04540) TLB-Lipid Panel (80061-LIPID) TLB-Hepatic/Liver Function Pnl (80076-HEPATIC)  Problem # 3:  HYPOTHYROIDISM (ICD-244.9) Assessment: Unchanged doing well on current med dose.  not due for labs until 10/11.  refills provided. Her updated medication list for this problem includes:    Levoxyl 50 Mcg Tabs (Levothyroxine sodium) .Marland Kitchen... Take one tablet daily  Complete Medication List: 1)  Lipitor 20 Mg Tabs (Atorvastatin calcium) .... Take one tablet at bedtime 2)  Amlodipine Besylate 10 Mg Tabs (Amlodipine besylate) .... Take 1/2 tablet by mouth once a day 3)  Restasis 0.05 %  Emul (Cyclosporine) 4)  Adult Aspirin Ec Low Strength 81 Mg Tbec (Aspirin) 5)  Multivitamin/iron 60 Mg Chew (Pediatric multivit-minerals) 6)  Co Q-10 Vitamin E Fish Oil 60-90-25-200 Caps (Dha-epa-coenzyme q10-vitamin e) 7)  Calcium + D 315-200 Mg-unit Tabs (Calcium citrate-vitamin d) .... 4 tabs daily 8)  Levoxyl 50 Mcg Tabs (Levothyroxine sodium) .... Take one  tablet daily 9)  Reclast 5 Mg/141ml Soln (Zoledronic acid)  Patient Instructions: 1)  Please schedule your complete physical in 6 months 2)  Call me sooner if you need me! 3)  Keep track of your blood pressure, if it shoots up- let me know!  (consistently higher than 140/90) 4)  Stop the Atenolol, continue the Norvasc 5)  We'll notify you of your lab results 6)  Have a great summer!! Prescriptions: LEVOXYL 50 MCG  TABS (LEVOTHYROXINE SODIUM) Take one tablet daily  #30 x 5   Entered and Authorized by:   Neena Rhymes MD   Signed by:   Neena Rhymes MD on 06/30/2009   Method used:   Electronically to        [facility] [address] #[id number]* (retail)       [address]       [place], [place]  [address]       Ph: [phone]       Fax: [phone]   RxID:   [id number]

## 2010-04-07 NOTE — Progress Notes (Signed)
Summary: prior auth for reclast   Phone Note Other Incoming   Summary of Call: information recieved from [facility] patient insurance required prior authorization for reclast completed and faxd form back to [employer] also spoke w/ Virl Diamond [phone] from [facility] says patient has been approved unitl 02/18/10 but will need to do authorization for upcoming year.  CPT [id number] [phone] Initial call taken by: Doristine Devoid CMA,  February 18, 2010 2:50 PM  Follow-up for Phone Call        receieved msg from Selena Batten at [employer] and patient reclast was approved auth# [id number] ph: [phone] that patient was approved from 02/22/10-02/23/11...........Marland KitchenDoristine Devoid CMA  February 23, 2010 9:14 AM

## 2010-04-07 NOTE — Progress Notes (Signed)
Summary: PRIOR AUTHORIZATION APPROVED  FOR LIPITOR [facility]  Phone Note Refill Request Message from:  Fax from Pharmacy on [facility] [address] FAX [phone]  Refills Requested: Medication #1:  LIPITOR 20 MG TABS take one tablet at bedtime PRIOR AUTHORIZATION [phone]  Initial call taken by: Barb Merino,  March 22, 2009 10:05 AM  Follow-up for Phone Call        prior auth in process for Lipitor [facility]  Additional Follow-up for Phone Call Additional follow up Details #1::        [facility] called and left these numbers for P[facility]A.: [phone] Additional Follow-up by: Warnell Forester,  March 22, 2009 1:34 PM    Additional Follow-up for Phone Call Additional follow up Details #2::    Prior auth APPROVED  for Lipitor from 03/23/09 to 03/23/2010 , [facility] left msg for pt rx approved.Kandice Hams  March 23, 2009 11:39 AM  Follow-up by: Kandice Hams,  March 23, 2009 11:39 AM

## 2010-04-07 NOTE — Op Note (Signed)
Summary: [facility]  [facility]   Imported By: Lanelle Bal 03/03/2010 11:46:52  _____________________________________________________________________  External Attachment:    Type:   Image     Comment:   External Document

## 2010-04-07 NOTE — Progress Notes (Signed)
Summary: lipitor refill   Phone Note Refill Request Call back at [phone] Message from:  Pharmacy on September 13, 2009 8:23 AM  Refills Requested: Medication #1:  LIPITOR 20 MG TABS take one tablet at bedtime   Dosage confirmed as above?Dosage Confirmed   Supply Requested: 1 month   Last Refilled: 08/15/2009 [facility] [address]  Next Appointment Scheduled: October 19th 2011 Initial call taken by: Lavell Islam,  September 13, 2009 8:24 AM    Prescriptions: LIPITOR 20 MG TABS (ATORVASTATIN CALCIUM) take one tablet at bedtime  #30 x 5   Entered by:   Doristine Devoid   Authorized by:   Neena Rhymes MD   Signed by:   Doristine Devoid on 09/13/2009   Method used:   Electronically to        [facility] [address] #[id number]* (retail)       [address]       [place], [place]  [address]       Ph: [phone]       Fax: [phone]   RxID:   [id number]

## 2010-04-07 NOTE — Letter (Signed)
Summary: [facility]  [facility]   Imported By: Lanelle Bal 06/16/2009 13:53:26  _____________________________________________________________________  External Attachment:    Type:   Image     Comment:   External Document

## 2010-04-07 NOTE — Progress Notes (Signed)
Summary: amlodipine refill   Phone Note Refill Request Message from:  Patient on August 09, 2009 11:40 AM  Refills Requested: Medication #1:  AMLODIPINE BESYLATE 10 MG TABS Take 1/2 tablet by mouth once a day [facility] [address] - [id number] - ph [phone]   Method Requested: Electronic Initial call taken by: Okey Regal [place],  August 09, 2009 11:41 AM    Prescriptions: AMLODIPINE BESYLATE 10 MG TABS (AMLODIPINE BESYLATE) Take 1/2 tablet by mouth once a day  #15.0 Each x 5   Entered by:   Doristine Devoid   Authorized by:   Neena Rhymes MD   Signed by:   Doristine Devoid on 08/09/2009   Method used:   Electronically to        [facility] [address] [id number]* (retail)       [address]       [place], [place]  [address]       Ph: [phone]       Fax: [phone]   RxID:   [id number]

## 2010-04-07 NOTE — Op Note (Signed)
Summary: [facility]  [facility]   Imported By: Lanelle Bal 02/15/2010 10:53:59  _____________________________________________________________________  External Attachment:    Type:   Image     Comment:   External Document

## 2010-04-07 NOTE — Assessment & Plan Note (Signed)
Summary: sinus infection//cough//lch   Vital Signs:  Patient profile:   65 year old female Weight:      111 pounds BMI:     21.58 Temp:     98.4 degrees F oral BP sitting:   128 / 72  (left arm)  Vitals Entered By: Doristine Devoid CMA (March 03, 2010 4:24 PM) CC: sinus congestion and facial pain x2 wks    History of Present Illness: 65 yo woman here today for ? sinus infxn.  sxs started 2 weeks ago w/ nasal congestion, sore throat.  now w/ laryngitis.  + facial pain/pressure, tooth pain.  + dry cough.  no fevers.  + sick contacts.  Current Medications (verified): 1)  Lipitor 20 Mg Tabs (Atorvastatin Calcium) .... Take One Tablet At Bedtime 2)  Amlodipine Besylate 10 Mg Tabs (Amlodipine Besylate) .... Take 1/2 Tablet By Mouth Once A Day 3)  Restasis 0.05 %  Emul (Cyclosporine) 4)  Adult Aspirin Ec Low Strength 81 Mg  Tbec (Aspirin) 5)  Multivitamin/iron 60 Mg  Chew (Pediatric Multivit-Minerals) 6)  Co Q-10 Vitamin E Fish Oil 60-90-25-200  Caps (Dha-Epa-Coenzyme Q10-Vitamin E) 7)  Calcium + D 315-200 Mg-Unit  Tabs (Calcium Citrate-Vitamin D) .... 4 Tabs Daily 8)  Levoxyl 50 Mcg  Tabs (Levothyroxine Sodium) .... Take One Tablet Daily 9)  Reclast 5 Mg/183ml Soln (Zoledronic Acid)  Allergies (verified): No Known Drug Allergies  Review of Systems      See HPI  Physical Exam  General:  Well-developed,well-nourished,in no acute distress; alert,appropriate and cooperative throughout examination Head:  Normocephalic and atraumatic without obvious abnormalities. No apparent alopecia or balding.  + TTP over frontal and maxillary sinuses Eyes:  no injxn or inflammation Ears:  External ear exam shows no significant lesions or deformities.  Otoscopic examination reveals clear canals, tympanic membranes are intact bilaterally without bulging, retraction, inflammation or discharge. Hearing is grossly normal bilaterally. Nose:  External nasal examination shows no deformity or inflammation.  Nasal mucosa are pink and moist without lesions or exudates. Mouth:  Oral mucosa and oropharynx without lesions or exudates.  Teeth in good repair. Neck:  No deformities, masses, or tenderness noted. Lungs:  Normal respiratory effort, chest expands symmetrically. Lungs are clear to auscultation, no crackles or wheezes. Heart:  Normal rate and regular rhythm. S1 and S2 normal without gallop, murmur, click, rub or other extra sounds.   Impression & Recommendations:  Problem # 1:  SINUSITIS - ACUTE-NOS (ICD-461.9) Assessment New pt's sxs and PE consistent w/ infxn.  start amox.  reviewed supportive care and red flags that should prompt return.  Pt expresses understanding and is in agreement w/ this plan. Her updated medication list for this problem includes:    Amoxicillin 500 Mg Tabs (Amoxicillin) .Marland Kitchen... 2 tabs by mouth two times a day x10 days  Complete Medication List: 1)  Lipitor 20 Mg Tabs (Atorvastatin calcium) .... Take one tablet at bedtime 2)  Amlodipine Besylate 10 Mg Tabs (Amlodipine besylate) .... Take 1/2 tablet by mouth once a day 3)  Restasis 0.05 % Emul (Cyclosporine) 4)  Adult Aspirin Ec Low Strength 81 Mg Tbec (Aspirin) 5)  Multivitamin/iron 60 Mg Chew (Pediatric multivit-minerals) 6)  Co Q-10 Vitamin E Fish Oil 60-90-25-200 Caps (Dha-epa-coenzyme q10-vitamin e) 7)  Calcium + D 315-200 Mg-unit Tabs (Calcium citrate-vitamin d) .... 4 tabs daily 8)  Levoxyl 50 Mcg Tabs (Levothyroxine sodium) .... Take one tablet daily 9)  Reclast 5 Mg/149ml Soln (Zoledronic acid) 10)  Amoxicillin 500 Mg Tabs (  Amoxicillin) .... 2 tabs by mouth two times a day x10 days  Patient Instructions: 1)  This is a sinus infection 2)  Take the Amoxicillin as directed for your sinuses- take w/ food to avoid upset stomach 3)  Drink plenty of fluids 4)  Mucinex to thin your congestion as needed 5)  Rest! 6)  Call with any questions or concerns 7)  Hang in there!! 8)  Happy New  Year!!! Prescriptions: AMOXICILLIN 500 MG TABS (AMOXICILLIN) 2 tabs by mouth two times a day x10 days  #40 x 0   Entered and Authorized by:   Neena Rhymes MD   Signed by:   Neena Rhymes MD on 03/03/2010   Method used:   Electronically to        [facility] [address] [id number]* (retail)       [address]       [place], [place]  [address]       Ph: [phone]       Fax: [phone]   RxID:   [id number]    Orders Added: 1)  Est. Patient Level III [72536]

## 2010-04-07 NOTE — Progress Notes (Signed)
Summary: refill  Phone Note Refill Request Message from:  Fax from Pharmacy on February 10, 2010 10:50 AM  Refills Requested: Medication #1:  LEVOXYL 50 MCG  TABS Take one tablet daily [facility] [address]  Initial call taken by: Okey Regal Spring,  February 10, 2010 10:51 AM    Prescriptions: LEVOXYL 50 MCG  TABS (LEVOTHYROXINE SODIUM) Take one tablet daily  #30 x 5   Entered by:   Doristine Devoid CMA   Authorized by:   Neena Rhymes MD   Signed by:   Doristine Devoid CMA on 02/10/2010   Method used:   Electronically to        [facility] [address] #[id number]* (retail)       [address]       [place], [place]  [address]       Ph: [phone]       Fax: [phone]   RxID:   [id number]

## 2010-04-07 NOTE — Progress Notes (Signed)
Summary: labs?, -have paperwork in process of scheduling reclast/cj  Phone Note Call from Patient Call back at Home Phone [phone]   Caller: Patient Summary of Call: Does pt need lab work before her reclast?   so that she can get appt set up for reclast before end of year for insurance purposes Initial call taken by: Jerolyn Shin,  February 07, 2010 9:53 AM  Follow-up for Phone Call        Patient will need creatinine and Ca+, any additional labs you would like? Please advise. Lucious Groves CMA  February 07, 2010 10:47 AM   Additional Follow-up for Phone Call Additional follow up Details #1::        no additional labs at this time.  please arrange reclast for pt Additional Follow-up by: Neena Rhymes MD,  February 07, 2010 12:21 PM    Additional Follow-up for Phone Call Additional follow up Details #2::    Left message on machine to call back to office. Lucious Groves CMA  February 07, 2010 2:53 PM   Patient notified of the above and last Reclast was 02/18/09. Lucious Groves CMA  February 07, 2010 4:52 PM

## 2010-04-07 NOTE — Medication Information (Signed)
Summary: Prior Authorization & Approval for Lipitor/[facility]  Prior Authorization & Approval for Lipitor/[facility]   Imported By: Lanelle Bal 03/26/2009 12:49:54  _____________________________________________________________________  External Attachment:    Type:   Image     Comment:   External Document

## 2010-04-07 NOTE — Miscellaneous (Signed)
  Clinical Lists Changes  Observations: Added new observation of MAMMOGRAM: normal (12/10/2009 11:49)      Preventive Care Screening  Mammogram:    Date:  12/10/2009    Results:  normal

## 2010-04-07 NOTE — Progress Notes (Signed)
Summary: left msg for pt to call  Phone Note Call from Patient Call back at Home Phone [phone]   Caller: Patient Call For: Neena Rhymes MD Reason for Call: Refill Medication Summary of Call: PATIENT IS CALLING, IS COMPLETELY OUT OF LIPITOR MEDICATION.  A REFILL WAS SENT [facility] ON 03-16-2009, HOWEVER THE PHARMACY DID NOT FILL BECAUSE PT'S INSURANCE WANTS MEDICATION CHANGED FROM LIPITOR TO A SUBSTITUTE.  PT STATES SHE & HER PHARMACY HAVE TRIED MULTIPLE TIMES TO CONTACT us ABOUT THIS, AND GET NO RESPONSE.  PT IS VERY ANXIOUS TO GET STARTED BACK ON A MEDICATION.  Initial call taken by: Magdalen Spatz [facility],  March 22, 2009 8:49 AM  Follow-up for Phone Call        left message on machine ..........Marland KitchenDoristine Devoid  March 22, 2009 10:23 AM   Additional Follow-up for Phone Call Additional follow up Details #1::        left msg for pt to call, samples fup front for Lipitor, Need ins info per pharmacy pt has new ins 2011 did not give card Additional Follow-up by: Kandice Hams,  March 22, 2009 11:36 AM    Additional Follow-up for Phone Call Additional follow up Details #2::    spoke w/ patient aware prior auth is in process and that we would leave samples up front..........Marland KitchenDoristine Devoid  March 22, 2009 12:25 PM

## 2010-04-07 NOTE — Procedures (Signed)
Summary: Colonoscopy/[facility]  Colonoscopy/[facility]   Imported By: Lanelle Bal 01/20/2010 10:43:24  _____________________________________________________________________  External Attachment:    Type:   Image     Comment:   External Document

## 2010-04-07 NOTE — Progress Notes (Signed)
Summary: lipitor refill   Phone Note Refill Request Message from:  Fax from Pharmacy on [facility] [address] fax [phone]  Refills Requested: Medication #1:  LIPITOR 20 MG TABS take one tablet at bedtime Initial call taken by: Barb Merino,  March 16, 2009 11:03 AM    Prescriptions: LIPITOR 20 MG TABS (ATORVASTATIN CALCIUM) take one tablet at bedtime  #30 x 4   Entered by:   Doristine Devoid   Authorized by:   Neena Rhymes MD   Signed by:   Doristine Devoid on 03/16/2009   Method used:   Electronically to        [facility] [address] #[id number]* (retail)       [address]       [place], [place]  [address]       Ph: [phone]       Fax: [phone]   RxID:   [id number]

## 2010-04-07 NOTE — Progress Notes (Signed)
Summary: levoxyl refill   Phone Note Refill Request Message from:  Fax from Pharmacy on January 31, 2010 10:23 AM  Refills Requested: Medication #1:  LEVOXYL 50 MCG  TABS Take one tablet daily [facility] [address] - fax [phone] - tel [phone]  Initial call taken by: Okey Regal [place],  January 31, 2010 10:34 AM    Prescriptions: LEVOXYL 50 MCG  TABS (LEVOTHYROXINE SODIUM) Take one tablet daily  #30 x 5   Entered by:   Doristine Devoid CMA   Authorized by:   Neena Rhymes MD   Signed by:   Doristine Devoid CMA on 01/31/2010   Method used:   Electronically to        [facility] [address] #[id number]* (retail)       [address]       [place], [place]  [address]       Ph: [phone]       Fax: [phone]   RxID:   [id number]

## 2010-04-07 NOTE — Progress Notes (Signed)
Summary: still no better  Phone Note Call from Patient   Caller: Patient Summary of Call: Pt states that cough has decreased some but still c/o facial pressure, earache/pressure and congestion. Pt still denies any fever. Advise pt to use mucinex D for congestion and to continue antibiotics, will forward to Dr Beverely Low for further suggestion. Pt uses [facility]. Pls advise.............Marland KitchenFelecia Deloach CMA  March 09, 2010 10:08 AM   Follow-up for Phone Call        if pt not feeling any better in regards to sinuses can switch to Avelox 400mg  daily x10 days.  this is a very strong abx and will treat any remaining infxn. Follow-up by: Neena Rhymes MD,  March 09, 2010 10:12 AM  Additional Follow-up for Phone Call Additional follow up Details #1::        Pt aware............Marland KitchenFelecia Deloach CMA  March 09, 2010 10:22 AM     New/Updated Medications: AVELOX 400 MG TABS (MOXIFLOXACIN HCL) Take 1 tab daily x10 days Prescriptions: AVELOX 400 MG TABS (MOXIFLOXACIN HCL) Take 1 tab daily x10 days  #10 x 0   Entered by:   Jeremy Johann CMA   Authorized by:   Neena Rhymes MD   Signed by:   Jeremy Johann CMA on 03/09/2010   Method used:   Faxed to ...       [facility] [address] [id number]* (retail)       [address]       [place], [place]  [address]       Ph: [phone]       Fax: [phone]   RxID:   [id number]

## 2010-04-07 NOTE — Medication Information (Signed)
Summary: Prior Authorization for Reclast/[facility]  Prior Authorization for Reclast/[facility]   Imported By: Lanelle Bal 03/03/2010 11:44:57  _____________________________________________________________________  External Attachment:    Type:   Image     Comment:   External Document

## 2010-04-07 NOTE — Progress Notes (Signed)
Summary: Atorvastatin refill  Phone Note Refill Request Message from:  Fax from Pharmacy on March 23, 2010 9:52 AM  Refills Requested: Medication #1:  LIPITOR 20 MG TABS take one tablet at bedtime   Last Refilled: 02/20/2010 [facility],  [address],  [place], [place]  phone [phone]   fax - [phone]    qty = 30  Next Appointment Scheduled: Fri 4/27    [place] Initial call taken by: Jerolyn Shin,  March 23, 2010 9:53 AM    Prescriptions: LIPITOR 20 MG TABS (ATORVASTATIN CALCIUM) take one tablet at bedtime  #30 x 5   Entered by:   Doristine Devoid CMA   Authorized by:   Neena Rhymes MD   Signed by:   Doristine Devoid CMA on 03/23/2010   Method used:   Electronically to        [facility] [address] [id number]* (retail)       [address]       [place], [place]  [address]       Ph: [phone]       Fax: [phone]   RxID:   [id number]

## 2010-05-21 ENCOUNTER — Encounter: Payer: Self-pay | Admitting: Family Medicine

## 2010-06-06 ENCOUNTER — Other Ambulatory Visit: Payer: Self-pay | Admitting: *Deleted

## 2010-06-06 ENCOUNTER — Telehealth: Payer: Self-pay | Admitting: Family Medicine

## 2010-06-06 MED ORDER — AMLODIPINE BESYLATE 10 MG PO TABS: 5.0000 mg | ORAL_TABLET | Freq: Every day | ORAL | Status: DC

## 2010-06-06 NOTE — Telephone Encounter (Signed)
Pt.notified

## 2010-06-06 NOTE — Telephone Encounter (Signed)
Done earlier today, re-sent rx.

## 2010-06-06 NOTE — Telephone Encounter (Signed)
Refilled, per [facility] pt is due for follow up.

## 2010-06-06 NOTE — Telephone Encounter (Signed)
Patient called to request prescription for Amlodipine----she says [facility] says they havent heard from [place] about their request---she uses  [facility], -----she requests that we send prescription today because she only has one pill left

## 2010-06-30 ENCOUNTER — Other Ambulatory Visit: Payer: Self-pay | Admitting: *Deleted

## 2010-06-30 MED ORDER — LEVOTHYROXINE SODIUM 50 MCG PO TABS: 50.0000 ug | ORAL_TABLET | Freq: Every day | ORAL | Status: DC

## 2010-07-01 ENCOUNTER — Encounter: Payer: Self-pay | Admitting: *Deleted

## 2010-07-01 ENCOUNTER — Encounter: Payer: Self-pay | Admitting: Family Medicine

## 2010-07-01 ENCOUNTER — Ambulatory Visit (INDEPENDENT_AMBULATORY_CARE_PROVIDER_SITE_OTHER): Payer: BC Managed Care – PPO | Admitting: Family Medicine

## 2010-07-01 DIAGNOSIS — E785 Hyperlipidemia, unspecified: Secondary | ICD-10-CM

## 2010-07-01 DIAGNOSIS — I10 Essential (primary) hypertension: Secondary | ICD-10-CM

## 2010-07-01 LAB — BASIC METABOLIC PANEL
CO2: 30 mEq/L (ref 19–32)
Chloride: 102 mEq/L (ref 96–112)
Sodium: 140 mEq/L (ref 135–145)

## 2010-07-01 LAB — HEPATIC FUNCTION PANEL
ALT: 17 U/L (ref 0–35)
AST: 21 U/L (ref 0–37)
Alkaline Phosphatase: 44 U/L (ref 39–117)
Bilirubin, Direct: 0.1 mg/dL (ref 0.0–0.3)
Total Protein: 6.5 g/dL (ref 6.0–8.3)

## 2010-07-01 LAB — LIPID PANEL
LDL Cholesterol: 98 mg/dL (ref 0–99)
Total CHOL/HDL Ratio: 2

## 2010-07-01 NOTE — Assessment & Plan Note (Signed)
Check labs and adjust meds prn.  Applauded pt's continued efforts at diet and exercise

## 2010-07-01 NOTE — Assessment & Plan Note (Signed)
BP mildly elevated today but pt admits to some increased stress.  BP at [facility] and at [facility] well controlled.  Asymptomatic.  No changes at this time.  Will follow.

## 2010-07-01 NOTE — Patient Instructions (Signed)
Follow up in 6 months for your complete physical- do not eat before this appt Keep up the good work on diet and exercise If your blood pressure continue to creep up- let me know Have a great spring!!!

## 2010-07-01 NOTE — Progress Notes (Signed)
  Subjective:    Patient ID: [name], female    DOB: [birth year], 65 y.o.   MRN: [id number]  HPI HTN- BP was elevated at dentist earlier in the week (140s/90s), no CP, SOB, HAs, visual changes, edema.  Has some increased stress w/ daughter's pregnancy.  Exercises regularly and eats well.  When checking BP at home and at the [facility] BP ranges 120s/60s.  Hyperlipidemia- pt exercising regularly, eating well.  Tolerating statin w/out difficulty.  Denies abd pain, N/V, myalgias.   Review of Systems For ROS see HPI     Objective:   Physical Exam  Constitutional: She is oriented to person, place, and time. She appears well-developed and well-nourished. No distress.  HENT:  Head: Normocephalic and atraumatic.  Eyes: Conjunctivae and EOM are normal. Pupils are equal, round, and reactive to light.  Neck: Normal range of motion. Neck supple. No thyromegaly present.  Cardiovascular: Normal rate, regular rhythm, normal heart sounds and intact distal pulses.   No murmur heard. Pulmonary/Chest: Effort normal and breath sounds normal. No respiratory distress.  Abdominal: Soft. She exhibits no distension. There is no tenderness.  Musculoskeletal: She exhibits no edema.  Lymphadenopathy:    She has no cervical adenopathy.  Neurological: She is alert and oriented to person, place, and time.  Skin: Skin is warm and dry.  Psychiatric: She has a normal mood and affect. Her behavior is normal.          Assessment & Plan:

## 2010-07-14 ENCOUNTER — Telehealth: Payer: Self-pay | Admitting: Family Medicine

## 2010-07-14 MED ORDER — AMLODIPINE BESYLATE 10 MG PO TABS: 5.0000 mg | ORAL_TABLET | Freq: Every day | ORAL | Status: DC

## 2010-07-14 NOTE — Telephone Encounter (Signed)
Patient called pharmacy [phone] -hasnt been refilled she took last pill -  needs refill for norvasc - [facility] [employer]

## 2010-07-14 NOTE — Telephone Encounter (Signed)
Rx sent and pt aware 

## 2010-07-19 NOTE — Op Note (Signed)
NAMEMALYN, AYTES                   ACCOUNT NO.:  [id number]   MEDICAL RECORD NO.:  [id number]          PATIENT TYPE:  AMB   LOCATION:  DSC                          FACILITY:  [facility]   PHYSICIAN:  Currie Paris, M.D.DATE OF BIRTH:  1945-03-20   DATE OF PROCEDURE:  09/03/2006  DATE OF DISCHARGE:                               OPERATIVE REPORT   PREOPERATIVE DIAGNOSIS:  Squamous cell carcinoma right upper posterior  calf.   POSTOPERATIVE DIAGNOSIS:  Squamous cell carcinoma right upper posterior  calf.   OPERATION:  Wide local excision squamous carcinoma of the calf.   SURGEON:  Dr. Jamey Ripa   ANESTHESIA:  Local.   CLINICAL HISTORY:  This is a 65 year old lady that had a small lesion  biopsied on her right calf about the mid lateral position.  This was a  squamous carcinoma.  Some brownish discoloration around it which had  some atypia.  We elected to do a wide excision of the entire area.   PROCEDURE:  The patient was seen in the minor procedure room.  We  identified the lesion and marked out 1 cm margin and confirmed the plans  for the surgery.   The area was then prepped with alcohol and anesthetized with 1%  Xylocaine with epi.  I used about a total of 8 mL.   We waited about 10 minutes and then I made an elliptical incision.  The  incision was oriented a little bit from inferior to superior and a  little bit from anterior to posterior with the bottom of it being the  anterior inferior aspect and then the top being the posterior superior  aspect.  If I had gone more vertically I think I would above crossed  over the joint area and I wanted to keep the scar out of that so I made  this somewhat oblique.   Once incision was made.  I used scissors and excised the skin and the  entire thickness down to fatty tissue including a layer of fat on the  deep margin.  I thought I had a 1 cm margin all directions around the  lesion.   Everything appeared to be dry.  I closed in  layers with 3-0 Vicryl  followed by 4-0 Monocryl subcuticular Dermabond.  The patient tolerated  procedure well and there were no complications.      Currie Paris, M.D.  Electronically Signed    CJS/MEDQ  D:  09/03/2006  T:  09/04/2006  Job:  [id number]   cc:   Theodora Blow. Danella Deis, M.D.

## 2010-07-22 NOTE — Assessment & Plan Note (Signed)
[facility]                        [place] OFFICE NOTE   SAMANTH, MIRKIN                          MRN:          [id number]  DATE:06/07/2006                            DOB:          10-24-1945    REASON FOR VISIT:  Chest pain.   Ms. Scheff is a 65 year old female who reports that about 2 weeks ago she  had a sudden episode of left-sided chest pain.  She has had 4 episodes  since then.  Sometimes it is associated with left upper arm ache.  Ms.  Walgren reports that she had a similar episode many years ago during a  significant stressful time in her life.  She was evaluated with a  cardiac catheter, which was unremarkable per patient.  During these last  4 episodes, she does report that she has been under a lot of stress.  She has attributed her symptoms to stress, but nevertheless was advised  by her husband to call her cardiologist's office.  The patient is here  because they recommended further evaluation.  Ms. Schmelzle states that the  symptoms last less than a minute.  It is never associated with activity.  Of note, the patient exercises regularly and does not have any chest  pain, shortness of breath or dyspnea on exertion.   The patient describes the discomfort as a weight sensation on the left  side of her chest.  Denies any nausea, vomiting, diaphoresis or dyspnea  with her symptoms.  Again, it was associated occasionally with left  upper arm discomfort.   PAST MEDICAL HISTORY:  1. Hypertension.  2. Hyperlipidemia.  3. Hypothyroidism.  4. Osteoporosis.   MEDICATIONS:  1. Lipitor 20 mg daily.  2. Levoxyl 0.5 mg daily.  3. Norvasc 5 mg daily.  4. Atenolol 25 mg daily.  5. Fosamax 70 mg weekly.  6. Vitamin E.  7. Caltrate D 2 tablets daily.  8. Centrum Silver one daily.  9. Aspirin 81 mg daily.  10.Fish oil capsule 2000 mg daily.  11.Restasis.   ALLERGIES:  No known drug allergies.   REVIEW OF SYSTEMS:  As per history of  present illness.   OBJECTIVE:  A pleasant female in no acute distress, answers questions  appropriately, alert and oriented, asymptomatic.  Weight 103.6, pulse 66, blood pressure 142/72, repeat at the end of the  visit was 120/78.  NECK:  Supple, no lymphadenopathy, carotid bruits, JVD or thyromegaly.  LUNGS:  Clear.  HEART:  Regular rate and rhythm, normal S1, S2, no murmurs, gallops or  rubs.  EXTREMITIES:  No cyanosis, clubbing or edema, 2+ pulses throughout.   EKG shows sinus brady, no ST elevations or depressions, no Q-wave, no  PVCs or PACs, no significant changes compared to a previous EKG in the  chart.   IMPRESSION:  A 65 year old female with a history of hypertension that is  well controlled and hyperlipidemia, presenting with a 2 week history of  intermittent left-sided chest pain.  Although she does have well  controlled blood pressure and her cholesterol is at goal, her symptoms  need to  be assessed further given the description of her discomfort.  It  is very possible that this is just stress-related since it is not  associated with physical activity, but nonetheless as I discussed with  Ms. Christell Constant, a cardiac etiology definitely has to be ascertained.   PLAN:  1. Advised the patient I will refer her back to Dr. Deborah Chalk for      further evaluation.  I anticipate that most likely at minimum she      will have a stress test.  I did review this with Ms. Limehouse.  2. Precautions were also reviewed.  I did advise Ms. Theys to avoid      physical activity until she is evaluated by Dr. Deborah Chalk or his      colleague, but the patient states that she feels better when she      exercises.  3. We will obtain a chest x-ray to rule out any obvious pathologies.     Leanne Chang, M.D.  Electronically Signed    LA/MedQ  DD: 06/08/2006  DT: 06/08/2006  Job #: [id number]

## 2010-07-22 NOTE — Procedures (Signed)
[place]  Patient:    [name], [name] Visit Number: [id number] MRN: [id number]          Service Type: END Location: ENDO Attending Physician:  Orland Mustard Dictated by:   Llana Aliment. Randa Evens, M.D. Proc. Date: 02/20/01 Admit Date:  02/20/2001   CC:         Caryn Bee C. Sydnee Levans, M.D.   Procedure Report  PROCEDURE PERFORMED:  Colonoscopy.  ENDOSCOPIST:  Llana Aliment. Randa Evens, M.D.  MEDICATIONS USED:  Fentanyl 75 mcg, Versed 6 mg IV.  INSTRUMENT:  Pediatric Olympus video colonoscope.  INDICATIONS:  Colon cancer screening.  DESCRIPTION OF PROCEDURE:  The procedure had been explained to the patient and consent obtained.  With the patient in the left lateral decubitus position, the Olympus pediatric video colonoscope was inserted and advanced under direct visualization.  The prep was excellent and we were able to advance to the cecum without difficulty.  The scope was withdrawn.  The cecum, ascending colon, hepatic flexure, transverse colon, splenic flexure, descending and sigmoid colon were seen well and were unremarkable.  No polyps were seen.  No significant diverticular disease.  Scope withdrawn, patient tolerated the procedure well.  ASSESSMENT:  Essentially normal colonoscopy.  PLAN:  Follow-up by Dr. Sydnee Levans, routine stool checks yearly for blood, possible screening the colon in some way in 10 years depending on what the results are at that time. Dictated by:   Llana Aliment. Randa Evens, M.D. Attending Physician:  Orland Mustard DD:  02/20/01 TD:  02/20/01 Job: [id number]

## 2010-08-03 ENCOUNTER — Ambulatory Visit (INDEPENDENT_AMBULATORY_CARE_PROVIDER_SITE_OTHER): Payer: BC Managed Care – PPO | Admitting: Family Medicine

## 2010-08-03 VITALS — BP 130/60 | Temp 97.5°F | Wt 111.4 lb

## 2010-08-03 DIAGNOSIS — M62838 Other muscle spasm: Secondary | ICD-10-CM

## 2010-08-03 MED ORDER — CYCLOBENZAPRINE HCL 10 MG PO TABS: 10.0000 mg | ORAL_TABLET | Freq: Three times a day (TID) | ORAL | Status: DC | PRN

## 2010-08-03 NOTE — Progress Notes (Signed)
  Subjective:    Patient ID: [name], female    DOB: 1945/10/19, 65 y.o.   MRN: [id number]  HPI L shoulder pain- sxs started 1 week ago.  Pain most noticeable when standing or sitting.  No pain w/ ROM.  No known injury.  Has tried 'heat, cold, ASA, ibuprofen, bengay'.  Pain most localized over L shoulder blade.  Pain described as an ache.  Will occasionally feel as if it's in the rib cage.  R hand dominant.  Denies recent heavy lifting.   Review of Systems For ROS see HPI     Objective:   Physical Exam  Constitutional: She appears well-developed and well-nourished. No distress.  HENT:  Head: Normocephalic and atraumatic.  Neck: Normal range of motion. Neck supple.       + Trap spasm on L  Musculoskeletal: Normal range of motion. She exhibits no edema and no tenderness.       Pain on L is not true shoulder pain but rather lower trap and supraspinatous spasm.  Lymphadenopathy:    She has no cervical adenopathy.          Assessment & Plan:

## 2010-08-03 NOTE — Patient Instructions (Signed)
This appears to be a trapezius spasm and should improve w/ time and meds Take the muscle relaxer at night Use a fraction of a tab during the day as needed for pain relief Continue the ibuprofen (advil) for inflammation Apply heat for pain relief Attempt to get a massage If no improvement in 1-2 weeks- call me!! Hang in there!!!

## 2010-08-15 NOTE — Assessment & Plan Note (Signed)
Continue NSAIDs, heat, and add muscle relaxers prn.  Reviewed supportive care and red flags that should prompt return.  Pt expressed understanding and is in agreement w/ plan.

## 2010-11-21 ENCOUNTER — Other Ambulatory Visit: Payer: Self-pay | Admitting: Family Medicine

## 2010-11-21 MED ORDER — ATORVASTATIN CALCIUM 20 MG PO TABS: 20.0000 mg | ORAL_TABLET | Freq: Every day | ORAL | Status: DC

## 2010-11-21 NOTE — Telephone Encounter (Signed)
Done

## 2010-11-21 NOTE — Telephone Encounter (Signed)
Patient changing to  Mail order - atordastatin (generic for lipitor) 90 day supply -[facility]

## 2010-12-12 ENCOUNTER — Other Ambulatory Visit: Payer: Self-pay | Admitting: Family Medicine

## 2010-12-12 MED ORDER — LEVOTHYROXINE SODIUM 50 MCG PO TABS: 50.0000 ug | ORAL_TABLET | Freq: Every day | ORAL | Status: DC

## 2010-12-12 NOTE — Telephone Encounter (Signed)
Done

## 2010-12-12 NOTE — Telephone Encounter (Signed)
Patient needs refill levothyroxine - [facility]

## 2010-12-23 ENCOUNTER — Encounter: Payer: Self-pay | Admitting: Family Medicine

## 2011-01-02 ENCOUNTER — Encounter: Payer: Self-pay | Admitting: Family Medicine

## 2011-01-02 ENCOUNTER — Ambulatory Visit (INDEPENDENT_AMBULATORY_CARE_PROVIDER_SITE_OTHER): Payer: BC Managed Care – PPO | Admitting: Family Medicine

## 2011-01-02 DIAGNOSIS — E039 Hypothyroidism, unspecified: Secondary | ICD-10-CM

## 2011-01-02 DIAGNOSIS — M810 Age-related osteoporosis without current pathological fracture: Secondary | ICD-10-CM

## 2011-01-02 DIAGNOSIS — Z23 Encounter for immunization: Secondary | ICD-10-CM

## 2011-01-02 DIAGNOSIS — Z0000 Encounter for general adult medical examination without abnormal findings: Secondary | ICD-10-CM | POA: Insufficient documentation

## 2011-01-02 DIAGNOSIS — E785 Hyperlipidemia, unspecified: Secondary | ICD-10-CM

## 2011-01-02 DIAGNOSIS — I10 Essential (primary) hypertension: Secondary | ICD-10-CM

## 2011-01-02 LAB — TSH: TSH: 0.66 u[IU]/mL (ref 0.35–5.50)

## 2011-01-02 LAB — BASIC METABOLIC PANEL
BUN: 14 mg/dL (ref 6–23)
Creatinine, Ser: 0.9 mg/dL (ref 0.4–1.2)
GFR: 67.58 mL/min (ref >60.00)

## 2011-01-02 LAB — CBC WITH DIFFERENTIAL/PLATELET
Basophils Relative: 0.5 % (ref 0.0–3.0)
Eosinophils Absolute: 0 10*3/uL (ref 0.0–0.7)
Hemoglobin: 14 g/dL (ref 12.0–15.0)
Lymphs Abs: 1.4 10*3/uL (ref 0.7–4.0)
MCHC: 33.6 g/dL (ref 30.0–36.0)
MCV: 91 fl (ref 78.0–100.0)
Monocytes Absolute: 0.3 10*3/uL (ref 0.1–1.0)
Neutro Abs: 6 10*3/uL (ref 1.4–7.7)
RBC: 4.59 Mil/uL (ref 3.87–5.11)

## 2011-01-02 LAB — HEPATIC FUNCTION PANEL: Total Bilirubin: 0.7 mg/dL (ref 0.3–1.2)

## 2011-01-02 LAB — LIPID PANEL: VLDL: 12.6 mg/dL (ref 0.0–40.0)

## 2011-01-02 MED ORDER — AMLODIPINE BESYLATE 10 MG PO TABS: 5.0000 mg | ORAL_TABLET | Freq: Every day | ORAL | Status: DC

## 2011-01-02 NOTE — Patient Instructions (Signed)
Follow up in 6 months to recheck BP and cholesterol Keep up the good work!  You look great! We'll notify you of your lab results and make any changes if needed Call with any questions or concerns Happy Holidays!!!

## 2011-01-02 NOTE — Progress Notes (Signed)
  Subjective:    Patient ID: [name], female    DOB: Aug 30, 1945, 65 y.o.   MRN: [id number]  HPI Welcome to Medicare PE  Risk Factors: HTN- chronic problem, excellent control on Norvasc.  asymptomatic Hyperlipidemia- chronic problem, typically well controlled on Lipitor Hypothyroid- chronic problem, on Synthroid, well controlled.  asymptomatic Physical Activity: very active Fall Risk: low risk Depression: reports chronic low self esteem but doesn't feel it's depression Hearing: normal to conversational tones, whispered voice ADL's: independent Cognitive: normal linear thought process, memory and attention span intact  Home Safety: safe at home, lives w/ husband Height, Weight, BMI, Visual Acuity: see vitals, vision corrected to 20/20 w/ glasses Counseling: UTD on health maintenance, colonoscopy, mammo.  No need for paps based on TAH.  Declines CXR Labs Ordered: See A&P Care Plan: See A&P    Review of Systems Patient reports no vision/ hearing changes, adenopathy,fever, weight change,  persistant/recurrent hoarseness , swallowing issues, chest pain, palpitations, edema, persistant/recurrent cough, hemoptysis, dyspnea (rest/exertional/paroxysmal nocturnal), gastrointestinal bleeding (melena, rectal bleeding), abdominal pain, significant heartburn, bowel changes, GU symptoms (dysuria, hematuria, incontinence), Gyn symptoms (abnormal  bleeding, pain),  syncope, focal weakness, memory loss, numbness & tingling, skin/hair/nail changes, abnormal bruising or bleeding, anxiety, or depression.     Objective:   Physical Exam  General Appearance:    Alert, cooperative, no distress, appears stated age  Head:    Normocephalic, without obvious abnormality, atraumatic  Eyes:    PERRL, conjunctiva/corneas clear, EOM's intact, fundi    benign, both eyes  Ears:    Normal TM's and external ear canals, both ears  Nose:   Nares normal, septum midline, mucosa normal, no drainage    or sinus tenderness    Throat:   Lips, mucosa, and tongue normal; teeth and gums normal  Neck:   Supple, symmetrical, trachea midline, no adenopathy;    Thyroid: no enlargement/tenderness/nodules  Back:     Symmetric, no curvature, ROM normal, no CVA tenderness  Lungs:     Clear to auscultation bilaterally, respirations unlabored  Chest Wall:    No tenderness or deformity   Heart:    Regular rate and rhythm, S1 and S2 normal, no murmur, rub   or gallop  Breast Exam:    No tenderness, masses, or nipple abnormality  Abdomen:     Soft, non-tender, bowel sounds active all four quadrants,    no masses, no organomegaly  Genitalia:    Deferred  Rectal:    Extremities:   Extremities normal, atraumatic, no cyanosis or edema  Pulses:   2+ and symmetric all extremities  Skin:   Skin color, texture, turgor normal, no rashes or lesions  Lymph nodes:   Cervical, supraclavicular, and axillary nodes normal  Neurologic:   CNII-XII intact, normal strength, sensation and reflexes    throughout          Assessment & Plan:

## 2011-01-03 ENCOUNTER — Encounter: Payer: Self-pay | Admitting: *Deleted

## 2011-01-03 NOTE — Assessment & Plan Note (Signed)
Pt's PE WNL.  UTD on health maintenance.  Declines CXR.  Has had flu shot.  Would like Tdap w/ birth of infant grandson.  Would also like pneumovax.  Check labs.  EKG done- see document for interpretation.  Anticipatory guidance provided.

## 2011-01-03 NOTE — Assessment & Plan Note (Signed)
Chronic problem.  Typically well controlled.  Tolerating statin w/out difficulty.  Check labs.  Adjust meds prn.

## 2011-01-03 NOTE — Assessment & Plan Note (Signed)
Pt due for Reclast in December.  Will proceed w/ benefit verification.

## 2011-01-03 NOTE — Assessment & Plan Note (Signed)
Chronic problem.  Check labs.  Adjust meds prn  

## 2011-01-03 NOTE — Assessment & Plan Note (Signed)
Chronic problem.  BP well controlled.  Asymptomatic.  No med changes.

## 2011-01-04 LAB — VITAMIN D 1,25 DIHYDROXY
Vitamin D 1, 25 (OH)2 Total: 59 pg/mL (ref 18–72)
Vitamin D3 1, 25 (OH)2: 59 pg/mL

## 2011-01-05 ENCOUNTER — Encounter: Payer: Self-pay | Admitting: *Deleted

## 2011-01-09 ENCOUNTER — Other Ambulatory Visit (INDEPENDENT_AMBULATORY_CARE_PROVIDER_SITE_OTHER): Payer: Medicare Other

## 2011-01-09 DIAGNOSIS — Z1211 Encounter for screening for malignant neoplasm of colon: Secondary | ICD-10-CM

## 2011-01-09 LAB — HEMOCCULT GUIAC POC 1CARD (OFFICE): Fecal Occult Blood, POC: NEGATIVE

## 2011-01-10 ENCOUNTER — Telehealth: Payer: Self-pay | Admitting: *Deleted

## 2011-01-10 NOTE — Telephone Encounter (Signed)
Advised pt of results. Pt understood

## 2011-02-10 ENCOUNTER — Telehealth: Payer: Self-pay | Admitting: Family Medicine

## 2011-02-14 NOTE — Telephone Encounter (Signed)
Noted no set up for pt to be seen for reclast. Printed insurance cards and sent the referall paperwork via fax. Called pt to apologize for time lapse and to advise we will do our best to get this set up asap. Left message to advise we are working on this for her.

## 2011-02-15 ENCOUNTER — Telehealth: Payer: Self-pay | Admitting: *Deleted

## 2011-02-15 NOTE — Telephone Encounter (Signed)
Pt called back in to clarify that she does not get a relcast shot per this nurse left her a message yesterday to advise that the reclast process had been started and to apologize for any inconvience via vm. Pt stated that she usually gets an infusion and no appt has been noted for her normal infusion for her reclast however pt advised that she has not had a bone density test in 3 years and wondered if she needs this to be done to clarify the reclast is even working?

## 2011-02-15 NOTE — Telephone Encounter (Signed)
Spoke to pt to advise we still have pending verifactions from reclast. Pt understood and notes that we will call her when the verification comes through

## 2011-02-15 NOTE — Telephone Encounter (Signed)
Reclast is always an infusion (not a shot) and this is what may have confused her.  She should continue w/ yearly infusions.  She had a bone density scan 01/17/10 (last year) and will be due in 2013.

## 2011-03-01 ENCOUNTER — Telehealth: Payer: Self-pay | Admitting: *Deleted

## 2011-03-01 NOTE — Telephone Encounter (Signed)
Called pt to advise the reclast benefits noted as copay of 20% pt will contact insurance company first to see what they will pay and then call us back to finish process

## 2011-04-19 ENCOUNTER — Telehealth: Payer: Self-pay | Admitting: Family Medicine

## 2011-04-19 NOTE — Telephone Encounter (Signed)
Please note

## 2011-04-19 NOTE — Telephone Encounter (Signed)
Reason for Call: Caller: Yaeko/Patient; PCP: Sheliah Hatch.; CB#: [phone];  Pt Has Question -About Chicken Pox.; exposure to 30 month old grandson, whose mom was diagnosed with +shingles, caller and husband have had chicken pox and shingles vaccine. Denies symptoms. Symptoms reviewed Chicken Pox guideline, with no symptoms, verbalizes understanding of home care with call back parameters reviewed.  Thanks!

## 2011-04-19 NOTE — Telephone Encounter (Signed)
Pt is not at risk of shingles or chicken pox by being exposed.  [name] is at risk of getting chicken pox from exposure to shingles

## 2011-04-19 NOTE — Telephone Encounter (Signed)
Called pt to give instructions based on previous note, pt understood and stated that the grandson has the chicken pox currently, told her that they are ok based on the vaccinations.pt understood

## 2011-06-21 ENCOUNTER — Telehealth: Payer: Self-pay | Admitting: *Deleted

## 2011-06-21 MED ORDER — LEVOTHYROXINE SODIUM 50 MCG PO TABS: 50.0000 ug | ORAL_TABLET | Freq: Every day | ORAL | Status: DC

## 2011-06-21 NOTE — Telephone Encounter (Signed)
Spoke to pt to advise results/instructions. Pt understood, sent new rx for levothyroxine daily to [facility] on [address] per pt request to send 90 day supply, pt will call back to schedule a follow up OV in 3 months.

## 2011-06-21 NOTE — Telephone Encounter (Signed)
Can switch to generic levothyroxine daily- will need f/u visit in 3 months to make sure meds are equivalent

## 2011-06-21 NOTE — Telephone Encounter (Signed)
Pt states that pharmacy called her today and advise her that the levoxyl will not be available until 2014. Pt would like to know if there is a alternative that she can take. Marland KitchenPlease advise

## 2011-07-03 ENCOUNTER — Ambulatory Visit (INDEPENDENT_AMBULATORY_CARE_PROVIDER_SITE_OTHER): Payer: Medicare Other | Admitting: Family Medicine

## 2011-07-03 ENCOUNTER — Encounter: Payer: Self-pay | Admitting: Family Medicine

## 2011-07-03 VITALS — BP 115/77 | HR 80 | Temp 97.6°F | Ht 60.5 in | Wt 112.0 lb

## 2011-07-03 DIAGNOSIS — E785 Hyperlipidemia, unspecified: Secondary | ICD-10-CM

## 2011-07-03 DIAGNOSIS — E039 Hypothyroidism, unspecified: Secondary | ICD-10-CM

## 2011-07-03 DIAGNOSIS — I10 Essential (primary) hypertension: Secondary | ICD-10-CM

## 2011-07-03 LAB — HEPATIC FUNCTION PANEL
ALT: 19 U/L (ref 0–35)
AST: 22 U/L (ref 0–37)
Bilirubin, Direct: 0 mg/dL (ref 0.0–0.3)
Total Protein: 7.2 g/dL (ref 6.0–8.3)

## 2011-07-03 LAB — BASIC METABOLIC PANEL
CO2: 30 mEq/L (ref 19–32)
Chloride: 101 mEq/L (ref 96–112)
Creatinine, Ser: 0.8 mg/dL (ref 0.4–1.2)

## 2011-07-03 LAB — LIPID PANEL
Total CHOL/HDL Ratio: 3
VLDL: 14.2 mg/dL (ref 0.0–40.0)

## 2011-07-03 NOTE — Assessment & Plan Note (Signed)
Chronic problem.  Excellent control.  Asymptomatic.  No changes. 

## 2011-07-03 NOTE — Progress Notes (Signed)
  Subjective:    Patient ID: [name], female    DOB: September 08, 1945, 66 y.o.   MRN: [id number]  HPI HTN- chronic problem, excellent control today on Norvasc.  No CP, SOB, HAs, visual changes, edema.  [facility]  Hyperlipidemia- chronic problem, due for labs.  On Lipitor.  No N/V, abd pain, myalgias.  Hypothyroid- chronic problem, needed to switch to Synthroid b/c insurance won't pay for levoxyl.  Will need to have labs in 3 months to ensure that levels remain therapeutic after switching meds.   Review of Systems For ROS see HPI     Objective:   Physical Exam  Vitals reviewed. Constitutional: She is oriented to person, place, and time. She appears well-developed and well-nourished. No distress.  HENT:  Head: Normocephalic and atraumatic.  Eyes: Conjunctivae and EOM are normal. Pupils are equal, round, and reactive to light.  Neck: Normal range of motion. Neck supple. No thyromegaly present.  Cardiovascular: Normal rate, regular rhythm, normal heart sounds and intact distal pulses.   No murmur heard. Pulmonary/Chest: Effort normal and breath sounds normal. No respiratory distress.  Abdominal: Soft. She exhibits no distension. There is no tenderness.  Musculoskeletal: She exhibits no edema.  Lymphadenopathy:    She has no cervical adenopathy.  Neurological: She is alert and oriented to person, place, and time.  Skin: Skin is warm and dry.  Psychiatric: She has a normal mood and affect. Her behavior is normal.          Assessment & Plan:

## 2011-07-03 NOTE — Assessment & Plan Note (Signed)
Chronic problem.  Pt had to switch meds based on formulary availability.  Will need lab visit in 3 months to recheck levels to ensure she remains in therapeutic range after med change.

## 2011-07-03 NOTE — Assessment & Plan Note (Signed)
Chronic problem.  Tolerating statin w/out difficulty.  Check labs.  Adjust prn.

## 2011-07-03 NOTE — Patient Instructions (Signed)
Schedule a lab visit in 3 months to recheck thyroid levels You look great!  Keep up the good work!! [employer] notify you of your lab results Call with any questions or concerns Happy Early [place] Ouch!!!

## 2011-09-01 ENCOUNTER — Ambulatory Visit (INDEPENDENT_AMBULATORY_CARE_PROVIDER_SITE_OTHER): Payer: Medicare Other | Admitting: Family Medicine

## 2011-09-01 ENCOUNTER — Encounter: Payer: Self-pay | Admitting: Family Medicine

## 2011-09-01 VITALS — BP 112/70 | HR 81 | Temp 98.7°F | Ht 61.0 in | Wt 112.8 lb

## 2011-09-01 DIAGNOSIS — M62838 Other muscle spasm: Secondary | ICD-10-CM

## 2011-09-01 MED ORDER — CYCLOBENZAPRINE HCL 10 MG PO TABS: 10.0000 mg | ORAL_TABLET | Freq: Three times a day (TID) | ORAL | Status: AC | PRN

## 2011-09-01 MED ORDER — NAPROXEN 500 MG PO TABS: 500.0000 mg | ORAL_TABLET | Freq: Two times a day (BID) | ORAL | Status: AC

## 2011-09-01 NOTE — Progress Notes (Signed)
  Subjective:    Patient ID: [name], female    DOB: 05-09-1945, 66 y.o.   MRN: [id number]  HPI Back pain- R sided, upper back, ? Trap spasm.  Has been doing body pump, zumba.  Pain is centered over R trap.  'i know that women have different sxs of heart attack'.  Not a fan of meds.  Taking OTC tylenol/ibuprofen.  sxs started 2 weeks ago, started meds 5 days ago.   Review of Systems For ROS see HPI     Objective:   Physical Exam  Vitals reviewed. Constitutional: She appears well-developed and well-nourished. No distress.  Neck:       Tight trap spasm on R  Musculoskeletal: Normal range of motion.       Full ROM of R shoulder  Lymphadenopathy:    She has no cervical adenopathy.  Neurological: She has normal reflexes. Coordination normal.          Assessment & Plan:

## 2011-09-01 NOTE — Patient Instructions (Addendum)
This is a muscle spasm Start the Naproxen twice daily for the next 5-7 days (w/ food) Use the muscle relaxers as needed- may make you drowsy Heat or Ice as needed for pain, inflammation Try and get a massage Happy [place] Birthday!!!

## 2011-09-01 NOTE — Assessment & Plan Note (Signed)
Pt's sxs and PE consistent w/ spasm.  Start scheduled NSAIDs, muscle relaxers prn.  Reviewed supportive care and red flags that should prompt return.  Pt expressed understanding and is in agreement w/ plan.

## 2011-09-06 ENCOUNTER — Encounter: Payer: Self-pay | Admitting: Cardiology

## 2011-09-06 ENCOUNTER — Ambulatory Visit (INDEPENDENT_AMBULATORY_CARE_PROVIDER_SITE_OTHER): Payer: Medicare Other | Admitting: Cardiology

## 2011-09-06 ENCOUNTER — Ambulatory Visit (INDEPENDENT_AMBULATORY_CARE_PROVIDER_SITE_OTHER)
Admission: RE | Admit: 2011-09-06 | Discharge: 2011-09-06 | Disposition: A | Discharge status: Home / Self Care | Payer: Medicare Other | Source: Ambulatory Visit | Attending: Cardiology | Admitting: Cardiology

## 2011-09-06 VITALS — BP 118/78 | HR 66 | Ht 61.0 in | Wt 114.0 lb

## 2011-09-06 DIAGNOSIS — M62838 Other muscle spasm: Secondary | ICD-10-CM

## 2011-09-06 DIAGNOSIS — R0789 Other chest pain: Secondary | ICD-10-CM

## 2011-09-06 DIAGNOSIS — E785 Hyperlipidemia, unspecified: Secondary | ICD-10-CM

## 2011-09-06 DIAGNOSIS — I10 Essential (primary) hypertension: Secondary | ICD-10-CM

## 2011-09-06 NOTE — Patient Instructions (Signed)
Will have you go for a chest Xray at the [place] building across from [facility]  Your physician recommends that you continue on your current medications as directed. Please refer to the Current Medication list given to you today.  Follow up as needed

## 2011-09-06 NOTE — Assessment & Plan Note (Signed)
She was given Flexeril and naproxen by her primary care provider.  He has been taking the naproxen but not the Flexeril.  Does not notice any significant improvement in the trapezius muscle spasm yet.  I suggested that she try the Flexeril as prescribed.  May also need to see a massage therapist if she fails to improve.  Her chest discomfort does not suggest ischemic heart pain.  I reviewed her recent electrocardiogram dated 01/02/11 which was normal

## 2011-09-06 NOTE — Progress Notes (Signed)
[name] Date of Birth:  1945/08/11 [facility] [address] [place], [place]  [address] [phone]         Fax   [phone]  History of Present Illness: This pleasant 66 year old woman is seen after a long absence.  She is a former patient of Dr. Deborah Chalk.  She has also seen Dr. Reyes Ivan.  She does not have any history of ischemic heart disease.  He had a normal stress echo in 2008.  She has a questionable remote history of mitral valve prolapse clinically although apparently not confirmed by echo.  She has a history of essential hypertension.  Recently she was seen by her primary care provider Dr. Beverely Low for pain in the area of the right trapezius muscle consistent with musculoskeletal origin.  The patient exercises on a regular basis and is generally healthy.  She does not like taking medications as a general rule. Current Outpatient Prescriptions  Medication Sig Dispense Refill  . amLODipine (NORVASC) 10 MG tablet Take 0.5 tablets (5 mg total) by mouth daily. 1/2 tablet  45 tablet  3  . aspirin 81 MG EC tablet Take 81 mg by mouth daily.        Marland Kitchen atorvastatin (LIPITOR) 20 MG tablet Take 1 tablet (20 mg total) by mouth daily.  90 tablet  0  . calcium citrate-vitamin D (CITRACAL+D) 315-200 MG-UNIT per tablet Take 1 tablet by mouth. 600 mg bid      . Cholecalciferol (VITAMIN D PO) Take 1,000 Int'l Units by mouth 2 (two) times daily.      . cyclobenzaprine (FLEXERIL) 10 MG tablet Take 1 tablet (10 mg total) by mouth every 8 (eight) hours as needed for muscle spasms.  30 tablet  1  . cycloSPORINE (RESTASIS) 0.05 % ophthalmic emulsion 1 drop 2 (two) times daily.        Marland Kitchen levothyroxine (SYNTHROID, LEVOTHROID) 50 MCG tablet Take 1 tablet (50 mcg total) by mouth daily.  90 tablet  0  . Multiple Vitamin (MULTIVITAMIN) tablet Take 1 tablet by mouth daily.        . naproxen (NAPROSYN) 500 MG tablet Take 1 tablet (500 mg total) by mouth 2 (two) times daily with a meal.  60  tablet  1  . vitamin E 400 UNIT capsule Take 400 Units by mouth daily.      . zoledronic acid (RECLAST) 5 MG/100ML SOLN Inject 5 mg into the vein once.          Allergies  Allergen Reactions  . Moxifloxacin     Patient Active Problem List  Diagnosis  . NEOPLASMS UNSPEC NATURE BONE SOFT TISSUE&SKIN  . HYPOTHYROIDISM  . HYPERLIPIDEMIA  . HYPERTENSION  . OSTEOPOROSIS  . SINUSITIS - ACUTE-NOS  . Trapezius muscle spasm  . General medical examination    History  Smoking status  . Never Smoker   Smokeless tobacco  . Not on file    History  Alcohol Use No    Family History  Problem Relation Age of Onset  . Heart disease Mother     congenital heart defect  . Cancer Father     kidney cancer  . Cancer Maternal Aunt     breast cancer  . Cancer Paternal Aunt     breast cancer    Review of Systems: Constitutional: no fever chills diaphoresis or fatigue or change in weight.  Head and neck: no hearing loss, no epistaxis, no photophobia or visual disturbance. Respiratory: No cough, shortness  of breath or wheezing. Cardiovascular: No chest pain peripheral edema, palpitations. Gastrointestinal: No abdominal distention, no abdominal pain, no change in bowel habits hematochezia or melena. Genitourinary: No dysuria, no frequency, no urgency, no nocturia. Musculoskeletal:No arthralgias, no back pain, no gait disturbance or myalgias. Neurological: No dizziness, no headaches, no numbness, no seizures, no syncope, no weakness, no tremors. Hematologic: No lymphadenopathy, no easy bruising. Psychiatric: No confusion, no hallucinations, no sleep disturbance.    Physical Exam: Filed Vitals:   09/06/11 1042  BP: 118/78  Pulse: 66   the general appearance reveals a well-developed well-nourished woman in no distress.The head and neck exam reveals pupils equal and reactive.  Extraocular movements are full.  There is no scleral icterus.  The mouth and pharynx are normal.  The neck is  supple.  The carotids reveal no bruits.  The jugular venous pressure is normal.  The  thyroid is not enlarged.  There is no lymphadenopathy.  The chest is clear to percussion and auscultation.  There are no rales or rhonchi.  Expansion of the chest is symmetrical.  The right trapezius muscle area appears to be firm suggesting some muscle spasm.  The precordium is quiet.  The first heart sound is normal.  The second heart sound is physiologically split.  There is no murmur gallop rub or click.  There is no abnormal lift or heave.  The abdomen is soft and nontender.  The bowel sounds are normal.  The liver and spleen are not enlarged.  There are no abdominal masses.  There are no abdominal bruits.  Extremities reveal good pedal pulses.  There is no phlebitis or edema.  There is no cyanosis or clubbing.  Strength is normal and symmetrical in all extremities.  There is no lateralizing weakness.  There are no sensory deficits.  The skin is warm and dry.  There is no rash.     Assessment / Plan: Continue present medication.  She has not had a chest x-ray in several years and we will get one.  She should try the Flexeril as prescribed by her primary care provider.  She will also consider a massage therapist. The patient can be rechecked here on a when necessary basis.

## 2011-09-06 NOTE — Assessment & Plan Note (Signed)
Patient has a past history of essential hypertension presently controlled on amlodipine 10 mg daily.  She avoids excessive salt.  She is not having any headaches or dizzy spells or any symptoms of exertional dyspnea or any recent palpitations

## 2011-09-06 NOTE — Assessment & Plan Note (Signed)
The patient has a history of hypercholesterolemia and is on low-dose Lipitor and is not having any side effects from the Lipitor

## 2011-09-12 ENCOUNTER — Telehealth: Payer: Self-pay | Admitting: Family Medicine

## 2011-09-12 MED ORDER — LEVOTHYROXINE SODIUM 50 MCG PO TABS: 50.0000 ug | ORAL_TABLET | Freq: Every day | ORAL | Status: DC

## 2011-09-12 NOTE — Telephone Encounter (Signed)
Called pt to clarify pharmacy name per noted [facility] does not show up in our system, pt noted her mail from the company and noted the name as [facility], advised that the RX will be sent to [facility] today, sent  #90, 3 refills of Levothyroxine to [facility] via escribe

## 2011-09-12 NOTE — Telephone Encounter (Signed)
Caller: Elvira/Patient is calling with a question about Levothryoxine.The medication was written by Sheliah Hatch Had difficulty with [facility] order pharmacy with RX for Levoxyl for 90 days, so Dr Beverely Low sent RX to [facility]. Had discussion with [facility] recently and pharmacist says RX can be filled with generic. Requests MD to send RX for Levothyroxine #90  to [facility].  Will run out of  current  RX within 2 weeks.  Last office visit 09/01/11.  Info noted and sent to [facility] CAN Pool for caller requests refill of RX med with valid refills per [facility].

## 2011-09-12 NOTE — Telephone Encounter (Signed)
Ok for #90, 3 refills of Levothyroxine

## 2011-09-13 ENCOUNTER — Telehealth: Payer: Self-pay | Admitting: *Deleted

## 2011-09-13 NOTE — Telephone Encounter (Signed)
Advised of xray results 

## 2011-09-13 NOTE — Telephone Encounter (Signed)
Message copied by Burnell Blanks on Wed Sep 13, 2011 10:31 AM ------      Message from: Cassell Clement      Created: Wed Sep 06, 2011  8:44 PM       Xray normal. Please report.

## 2011-10-03 ENCOUNTER — Other Ambulatory Visit (INDEPENDENT_AMBULATORY_CARE_PROVIDER_SITE_OTHER): Payer: Medicare Other

## 2011-10-03 ENCOUNTER — Encounter: Payer: Self-pay | Admitting: *Deleted

## 2011-10-03 DIAGNOSIS — E785 Hyperlipidemia, unspecified: Secondary | ICD-10-CM

## 2011-10-03 LAB — TSH: TSH: 0.72 u[IU]/mL (ref 0.35–5.50)

## 2011-12-18 ENCOUNTER — Other Ambulatory Visit: Payer: Self-pay | Admitting: Family Medicine

## 2011-12-18 MED ORDER — ATORVASTATIN CALCIUM 20 MG PO TABS: 20.0000 mg | ORAL_TABLET | Freq: Every day | ORAL | Status: DC

## 2011-12-18 MED ORDER — AMLODIPINE BESYLATE 10 MG PO TABS: 5.0000 mg | ORAL_TABLET | Freq: Every day | ORAL | Status: DC

## 2011-12-18 NOTE — Telephone Encounter (Signed)
rx sent to pharmacy by e-script  

## 2011-12-18 NOTE — Telephone Encounter (Signed)
refills x 2 -- last ov 6.28.13-Acute -- CPE scheduled for 10.30.13  1-Atorvastatin Calcium (Tab) 20 MG Take 1 tablet (20 mg total) by mouth daily, last fill 9.17.12  2-AmLODIPine Besylate (Tab) 10 MG Take 0.5 tablets (5 mg total) by mouth daily. 1/2 tablet, last fill 10.29.12

## 2011-12-20 ENCOUNTER — Encounter: Payer: Self-pay | Admitting: Family Medicine

## 2011-12-20 ENCOUNTER — Telehealth: Payer: Self-pay | Admitting: *Deleted

## 2011-12-20 MED ORDER — AMLODIPINE BESYLATE 5 MG PO TABS: 5.0000 mg | ORAL_TABLET | Freq: Every day | ORAL | Status: DC

## 2011-12-20 NOTE — Telephone Encounter (Signed)
Changed to norvasc to 5mg  per MD Beverely Low #90 with 2 refills via fax, changed in chart, faxed to [facility]

## 2011-12-21 ENCOUNTER — Other Ambulatory Visit: Payer: Self-pay

## 2011-12-21 NOTE — Telephone Encounter (Signed)
Pt calling concerning 2Rx refills amlodopine and atorvastatin through [facility]. I advised pt that both Rx was approved by [facility] and sent 12/18/11 and 12/20/11. Pt stated understanding and stated she would follow up with [facility] to see whats wrong on their end.  MW

## 2011-12-21 NOTE — Telephone Encounter (Signed)
Pt called back stating still having problems with pharmacy. i called to talk to pharmacy and verified script and directions. Rx done. Called pt back left a message to make pt aware .    MW

## 2012-01-02 ENCOUNTER — Telehealth: Payer: Self-pay | Admitting: *Deleted

## 2012-01-02 NOTE — Telephone Encounter (Signed)
Called pt to verify what her insurance said about the reclast we discussed, pt noted that the insurance she had at the time gave her the run-around about exactly how much she would have to pay and she decided to "throw my hands up about it" pt did advise that she now has medicare and not sure how this will work for that company, pt will discuss options with MD Tabori tomorrow morning at CPE scheduled

## 2012-01-02 NOTE — Telephone Encounter (Signed)
Will discuss at time of OV

## 2012-01-03 ENCOUNTER — Ambulatory Visit (INDEPENDENT_AMBULATORY_CARE_PROVIDER_SITE_OTHER): Payer: Medicare Other | Admitting: Family Medicine

## 2012-01-03 ENCOUNTER — Encounter: Payer: Self-pay | Admitting: Family Medicine

## 2012-01-03 ENCOUNTER — Telehealth: Payer: Self-pay

## 2012-01-03 VITALS — BP 115/74 | HR 76 | Temp 97.6°F | Ht 60.25 in | Wt 118.4 lb

## 2012-01-03 DIAGNOSIS — E039 Hypothyroidism, unspecified: Secondary | ICD-10-CM

## 2012-01-03 DIAGNOSIS — Z23 Encounter for immunization: Secondary | ICD-10-CM

## 2012-01-03 DIAGNOSIS — M810 Age-related osteoporosis without current pathological fracture: Secondary | ICD-10-CM

## 2012-01-03 DIAGNOSIS — E785 Hyperlipidemia, unspecified: Secondary | ICD-10-CM

## 2012-01-03 DIAGNOSIS — Z0000 Encounter for general adult medical examination without abnormal findings: Secondary | ICD-10-CM

## 2012-01-03 DIAGNOSIS — I10 Essential (primary) hypertension: Secondary | ICD-10-CM

## 2012-01-03 LAB — CBC WITH DIFFERENTIAL/PLATELET
Basophils Relative: 0.2 % (ref 0.0–3.0)
Eosinophils Relative: 0.6 % (ref 0.0–5.0)
HCT: 42.5 % (ref 36.0–46.0)
Hemoglobin: 14.1 g/dL (ref 12.0–15.0)
Lymphs Abs: 1.1 10*3/uL (ref 0.7–4.0)
Monocytes Relative: 5.8 % (ref 3.0–12.0)
Neutro Abs: 7.2 10*3/uL (ref 1.4–7.7)
WBC: 8.9 10*3/uL (ref 4.5–10.5)

## 2012-01-03 LAB — LIPID PANEL
Total CHOL/HDL Ratio: 3
Triglycerides: 104 mg/dL (ref 0.0–149.0)

## 2012-01-03 LAB — HEPATIC FUNCTION PANEL
Albumin: 4 g/dL (ref 3.5–5.2)
Alkaline Phosphatase: 54 U/L (ref 39–117)
Total Protein: 7.2 g/dL (ref 6.0–8.3)

## 2012-01-03 LAB — BASIC METABOLIC PANEL
BUN: 14 mg/dL (ref 6–23)
CO2: 29 mEq/L (ref 19–32)
Calcium: 8.7 mg/dL (ref 8.4–10.5)
Creatinine, Ser: 0.8 mg/dL (ref 0.4–1.2)
Glucose, Bld: 83 mg/dL (ref 70–99)
Sodium: 138 mEq/L (ref 135–145)

## 2012-01-03 NOTE — Assessment & Plan Note (Signed)
Chronic problem.  Has had 2 reclast infusions.  Due for another but would like to hold off until DEXA results available.  Continue Ca + Vit D.

## 2012-01-03 NOTE — Patient Instructions (Addendum)
Follow up in 6 months to repeat BP and cholesterol We'll notify you of your lab results and make any changes if needed Keep up the good work!  You look great! Call with any questions or concerns Happy Halloween!

## 2012-01-03 NOTE — Assessment & Plan Note (Signed)
Chronic problem, tolerating med w/out difficulty.  Check labs.  Adjust meds prn  

## 2012-01-03 NOTE — Assessment & Plan Note (Signed)
Chronic problem.  Asymptomatic.  Check labs.  Adjust meds prn  

## 2012-01-03 NOTE — Telephone Encounter (Signed)
Pt called left a message on triage line stating came in this AM for a physical wanted to make sure T[facility] put in a bone density appt.  PL advise       MW

## 2012-01-03 NOTE — Assessment & Plan Note (Signed)
Chronic problem.  Excellent control.  Asymptomatic.  No changes. 

## 2012-01-03 NOTE — Assessment & Plan Note (Signed)
Pt's PE WNL.  UTD on mammo and colonoscopy.  Due for DEXA.  Check labs.  Anticipatory guidance provided.

## 2012-01-03 NOTE — Telephone Encounter (Signed)
Called pt and left message on pt home number to advise Bone density order put in per [facility].    MW

## 2012-01-03 NOTE — Telephone Encounter (Signed)
Pt advised she will await dexa scan results prior to proceeding with reclast, paperwork in reclast folder and post it notation made to advise pending dex scan results MD Tabori made aware

## 2012-01-03 NOTE — Telephone Encounter (Signed)
Yes order is in

## 2012-01-03 NOTE — Progress Notes (Signed)
  Subjective:    Patient ID: [name], female    DOB: Jul 06, 1945, 66 y.o.   MRN: [id number]  HPI Here today for CPE.  Risk Factors: Hyperlipidemia- chronic problem, on Lipitor daily.  Denies abd pain, N/V, myalgias HTN- chronic problem, excellent control on Norvasc.  No CP, SOB, HAs, visual changes, edema. Hypothyroid- chronic problem, on Synthroid daily.  Denies fatigue, heat/cold intolerance Osteoporosis- Dexa 11/11.  Has had Reclast infusion 12/11.  Due for repeat DEXA.  On Ca+Vit D Physical Activity: very active, still running Fall Risk: low risk Depression: no current signs sxs Hearing: normal to whispered voice at 6 ft ADL's: independent Cognitive: normal linear thought process, memory and attention intact Home Safety: safe at home Height, Weight, BMI, Visual Acuity: see vitals, vision corrected to 20/20 w/ glasses Counseling: UTD on mammo, colonoscopy, due for DEXA Labs Ordered: See A&P Care Plan: See A&P    Review of Systems ROS Patient reports no vision/ hearing changes, adenopathy,fever, weight change,  persistant/recurrent hoarseness , swallowing issues, chest pain, palpitations, edema, persistant/recurrent cough, hemoptysis, dyspnea (rest/exertional/paroxysmal nocturnal), gastrointestinal bleeding (melena, rectal bleeding), abdominal pain, significant heartburn, bowel changes, GU symptoms (dysuria, hematuria, incontinence), Gyn symptoms (abnormal  bleeding, pain),  syncope, focal weakness, memory loss, numbness & tingling, skin/hair/nail changes, abnormal bruising or bleeding, anxiety, or depression.     Objective:   Physical Exam General Appearance:    Alert, cooperative, no distress, appears stated age  Head:    Normocephalic, without obvious abnormality, atraumatic  Eyes:    PERRL, conjunctiva/corneas clear, EOM's intact, fundi    benign, both eyes  Ears:    Normal TM's and external ear canals, both ears  Nose:   Nares normal, septum midline, mucosa normal, no  drainage    or sinus tenderness  Throat:   Lips, mucosa, and tongue normal; teeth and gums normal  Neck:   Supple, symmetrical, trachea midline, no adenopathy;    Thyroid: no enlargement/tenderness/nodules  Back:     Symmetric, no curvature, ROM normal, no CVA tenderness  Lungs:     Clear to auscultation bilaterally, respirations unlabored  Chest Wall:    No tenderness or deformity   Heart:    Regular rate and rhythm, S1 and S2 normal, no murmur, rub   or gallop  Breast Exam:    Deferred at pt's request due to mammo  Abdomen:     Soft, non-tender, bowel sounds active all four quadrants,    no masses, no organomegaly  Genitalia:    Deferred  Rectal:    Extremities:   Extremities normal, atraumatic, no cyanosis or edema  Pulses:   2+ and symmetric all extremities  Skin:   Skin color, texture, turgor normal, no rashes or lesions  Lymph nodes:   Cervical, supraclavicular, and axillary nodes normal  Neurologic:   CNII-XII intact, normal strength, sensation and reflexes    throughout          Assessment & Plan:

## 2012-01-08 LAB — VITAMIN D 1,25 DIHYDROXY: Vitamin D2 1, 25 (OH)2: <8 pg/mL

## 2012-01-12 ENCOUNTER — Other Ambulatory Visit (INDEPENDENT_AMBULATORY_CARE_PROVIDER_SITE_OTHER): Payer: Medicare Other

## 2012-01-12 DIAGNOSIS — Z1289 Encounter for screening for malignant neoplasm of other sites: Secondary | ICD-10-CM

## 2012-02-05 ENCOUNTER — Telehealth: Payer: Self-pay | Admitting: Family Medicine

## 2012-02-05 NOTE — Telephone Encounter (Signed)
thx

## 2012-02-05 NOTE — Telephone Encounter (Signed)
Patient Information:  Caller Name: Huntley  Phone: [phone]  Patient: [name], [name]  Gender: Female  DOB: [birth year]  Age: 66 Years  PCP: Sheliah Hatch.   Symptoms  Reason For Call & Symptoms: Cold symptoms for approx 2 weeks, Cough, Afebrile,  has had blood tinged sputum from nose, Is now having balance issues, ears feel full  Reviewed Health History In EMR: Yes  Reviewed Medications In EMR: Yes  Reviewed Allergies In EMR: Yes  Reviewed Surgeries / Procedures: Yes  Date of Onset of Symptoms: 01/22/2012  Treatments Tried: OTC mucinex and robitussin and Netti Pot  Treatments Tried Worked: No  Guideline(s) Used:  Ear - Congestion  Disposition Per Guideline:   See Within 3 Days in Office  Reason For Disposition Reached:   Ear congestion present > 48 hours  Advice Given:  Reassurance:  Definition: Ear congestion is the medical term used to describe symptoms of a stuffy, full, or plugged sensation in the ear. People also sometimes describe crackling or popping noises in the ear. Sometimes hearing seems slightly muffled.  Eustacian tube: There is a small collapsible tube that runs between the middle ear and the nose. Normally, it permits tiny amounts of air to move in and out of the middle ear. When the tube gets blocked, air or fluid can build up behind the ear drum (tympanic membrane). This causes the symptoms of ear congestion.  Causes  Upper respiratory infections (colds) and nasal allergies (hay fever) can block the eustachian tube.  Blowing the nose too hard can also push air and fluid into the eustachian tube.  Causes  Upper respiratory infections (colds) and nasal allergies (hay fever) can block the eustachian tube.  Blowing the nose too hard can also push air and fluid into the eustachian tube.  Treatment - Chewing and Swallowing:   Try chewing gum.  You can also try swallowing water while pinching your nostrils closed. The reason this works is that it creates a  small vacuum in the nose. This helps the eustachian tube to open up.  Treatment - Decongestant Nasal Spray:  If chewing or swallowing doesn't help after 1 or 2 hours, you can try using an over-the-counter (OTC) nasal decongestant drops. You can use the nose drops twice a day.  Oxymetazoline Nasal Drops (e.g., Afrin): Available OTC. Clean out the nose before using. Spray each nostril once, wait one minute for it to absorb, and then spray a second time.  Caution - Nasal Decongestants:  Do not take these medications if you have high blood pressure, heart disease, prostate problems, or an overactive thyroid.  Call Back If:   Ear congestion lasts over 48 hours  Ear pain or fever occurs  Office Follow Up:  Does the office need to follow up with this patient?: No  Instructions For The Office: N/A  Appointment Scheduled:  02/06/2012 11:30:00

## 2012-02-05 NOTE — Telephone Encounter (Signed)
Pt has appt schedule for tomorrow with Dr Beverely Low.

## 2012-02-06 ENCOUNTER — Encounter: Payer: Self-pay | Admitting: Family Medicine

## 2012-02-06 ENCOUNTER — Ambulatory Visit (INDEPENDENT_AMBULATORY_CARE_PROVIDER_SITE_OTHER): Payer: Medicare Other | Admitting: Family Medicine

## 2012-02-06 VITALS — BP 132/78 | HR 71 | Temp 98.1°F | Ht 60.25 in | Wt 121.0 lb

## 2012-02-06 DIAGNOSIS — J019 Acute sinusitis, unspecified: Secondary | ICD-10-CM

## 2012-02-06 MED ORDER — SULFAMETHOXAZOLE-TMP DS 800-160 MG PO TABS: 1.0000 | ORAL_TABLET | Freq: Two times a day (BID) | ORAL | Status: DC

## 2012-02-06 NOTE — Patient Instructions (Addendum)
This is a sinus infection Start the Bactrim twice daily- take w/ food Continue the Mucinex to thin congestion Drink plenty of fluids REST! Hang in there! Happy Holidays!

## 2012-02-06 NOTE — Progress Notes (Signed)
  Subjective:    Patient ID: [name], female    DOB: Mar 31, 1945, 66 y.o.   MRN: [id number]  HPI Ear fullness- sxs started 2 weeks ago w/ scratchy throat, progressed to laryngitis.  Now w/ dry cough, facial pressure, discolored nasal drainage.  No fevers.  + sick contacts.   Review of Systems For ROS see HPI     Objective:   Physical Exam  Vitals reviewed. Constitutional: She appears well-developed and well-nourished. No distress.  HENT:  Head: Normocephalic and atraumatic.  Right Ear: Tympanic membrane normal.  Left Ear: Tympanic membrane normal.  Nose: Mucosal edema and rhinorrhea present. Right sinus exhibits maxillary sinus tenderness and frontal sinus tenderness. Left sinus exhibits maxillary sinus tenderness and frontal sinus tenderness.  Mouth/Throat: Uvula is midline and mucous membranes are normal. Posterior oropharyngeal erythema present. No oropharyngeal exudate.  Eyes: Conjunctivae normal and EOM are normal. Pupils are equal, round, and reactive to light.  Neck: Normal range of motion. Neck supple.  Cardiovascular: Normal rate, regular rhythm and normal heart sounds.   Pulmonary/Chest: Effort normal and breath sounds normal. No respiratory distress. She has no wheezes.  Lymphadenopathy:    She has no cervical adenopathy.          Assessment & Plan:

## 2012-02-13 NOTE — Assessment & Plan Note (Signed)
New.  Pt's sxs and PE consistent w/ infxn.  Start abx.  Reviewed supportive care and red flags that should prompt return.  Pt expressed understanding and is in agreement w/ plan.  

## 2012-03-05 ENCOUNTER — Telehealth: Payer: Self-pay | Admitting: *Deleted

## 2012-03-05 ENCOUNTER — Telehealth: Payer: Self-pay | Admitting: Family Medicine

## 2012-03-05 NOTE — Telephone Encounter (Signed)
Per Dr Beverely Low some improvement on hip scores this is great news..Discuss with patient.

## 2012-03-05 NOTE — Telephone Encounter (Signed)
Patient states [name] needs an order for her Reclast injection. Call patient back at [phone]. Patient also states that [name] advised she will need some lab work prior to this (kidney function and calcium).

## 2012-03-08 NOTE — Telephone Encounter (Signed)
Please advise on RF request.//AB/CMA 

## 2012-03-08 NOTE — Telephone Encounter (Signed)
Ok for Reclast if it's been 1 year- needs BMP done w/in 30 days to have infusion done

## 2012-03-11 NOTE — Telephone Encounter (Signed)
LM @ (2:07pm) asking the pt to RTC.//AB/CMA

## 2012-03-12 NOTE — Telephone Encounter (Signed)
Spoke with the pt and informed her that Dr. Tawni Carnes the Reclast if it been 1 year, and will need BMP drawn w/in 30 days to the infusion.  Pt stated that it has been 2 years.  Told the pt we will schedule an lab appt, then we will f/o the order form with her lab results.  Will then fax the order to [facility] and get an appt for her and then call her with the appt time.  Pt agreed.//AB/CMA

## 2012-03-15 ENCOUNTER — Other Ambulatory Visit (INDEPENDENT_AMBULATORY_CARE_PROVIDER_SITE_OTHER): Payer: Medicare Other

## 2012-03-15 ENCOUNTER — Telehealth: Payer: Self-pay | Admitting: Family Medicine

## 2012-03-15 ENCOUNTER — Other Ambulatory Visit: Payer: Self-pay | Admitting: Family Medicine

## 2012-03-15 DIAGNOSIS — Q782 Osteopetrosis: Secondary | ICD-10-CM

## 2012-03-15 LAB — BASIC METABOLIC PANEL WITH GFR
BUN: 18 mg/dL (ref 6–23)
CO2: 30 meq/L (ref 19–32)
Calcium: 9.4 mg/dL (ref 8.4–10.5)
Chloride: 102 meq/L (ref 96–112)
Creatinine, Ser: 0.8 mg/dL (ref 0.4–1.2)
GFR: 80.79 mL/min
Glucose, Bld: 86 mg/dL (ref 70–99)
Potassium: 3.7 meq/L (ref 3.5–5.1)
Sodium: 139 meq/L (ref 135–145)

## 2012-03-15 MED ORDER — ATORVASTATIN CALCIUM 20 MG PO TABS: 20.0000 mg | ORAL_TABLET | Freq: Every day | ORAL | Status: DC

## 2012-03-15 NOTE — Telephone Encounter (Signed)
Refill: Atorvastatin calcium tablet 20 mg. Take 1 by mouth daily. 90 day supply

## 2012-03-15 NOTE — Telephone Encounter (Signed)
Refill done.  

## 2012-03-15 NOTE — Telephone Encounter (Signed)
Needs Atorvastatin tablet 20 mg called into new pharm ([facility])  Double checking to make sure it went thorough from this morning.

## 2012-03-18 NOTE — Telephone Encounter (Signed)
Form f/o and faxed to [facility] to schedule an appt for the Reclast infusion.//AB/CMA

## 2012-03-19 ENCOUNTER — Encounter: Payer: Self-pay | Admitting: *Deleted

## 2012-03-19 NOTE — Telephone Encounter (Signed)
Refill done.  

## 2012-03-21 ENCOUNTER — Telehealth: Payer: Self-pay | Admitting: Family Medicine

## 2012-03-21 NOTE — Telephone Encounter (Signed)
PT CALLED [facility] -- made reclast appt for 1.29.14, 11am -- just an [place]

## 2012-03-22 NOTE — Telephone Encounter (Signed)
Pt called with appt time for Reclast at [facility] on (04-03-12 @ 11:00am).//AB/CMA

## 2012-03-29 ENCOUNTER — Encounter: Payer: Self-pay | Admitting: Family Medicine

## 2012-04-01 ENCOUNTER — Other Ambulatory Visit (HOSPITAL_COMMUNITY): Payer: Self-pay

## 2012-04-03 ENCOUNTER — Encounter (HOSPITAL_COMMUNITY)
Admission: RE | Admit: 2012-04-03 | Discharge: 2012-04-03 | Disposition: A | Discharge status: Home / Self Care | Payer: Medicare Other | Source: Ambulatory Visit | Attending: Family Medicine | Admitting: Family Medicine

## 2012-04-03 ENCOUNTER — Encounter (HOSPITAL_COMMUNITY): Payer: Self-pay

## 2012-04-03 DIAGNOSIS — M810 Age-related osteoporosis without current pathological fracture: Secondary | ICD-10-CM | POA: Insufficient documentation

## 2012-04-03 MED ORDER — ZOLEDRONIC ACID 5 MG/100ML IV SOLN
5.0000 mg | Freq: Once | INTRAVENOUS | Status: AC
  Administered 2012-04-03: 5 mg via INTRAVENOUS
  Filled 2012-04-03: qty 100

## 2012-04-03 MED ORDER — SODIUM CHLORIDE 0.9 % IV SOLN
Freq: Once | INTRAVENOUS | Status: AC
  Administered 2012-04-03: 11:00:00 via INTRAVENOUS

## 2012-05-30 ENCOUNTER — Other Ambulatory Visit: Payer: Self-pay | Admitting: Dermatology

## 2012-07-02 ENCOUNTER — Encounter: Payer: Self-pay | Admitting: Lab

## 2012-07-03 ENCOUNTER — Encounter: Payer: Self-pay | Admitting: Family Medicine

## 2012-07-03 ENCOUNTER — Ambulatory Visit (INDEPENDENT_AMBULATORY_CARE_PROVIDER_SITE_OTHER): Payer: Medicare Other | Admitting: Family Medicine

## 2012-07-03 ENCOUNTER — Encounter: Payer: Self-pay | Admitting: Lab

## 2012-07-03 VITALS — BP 140/70 | HR 64 | Temp 97.7°F | Ht 60.0 in | Wt 123.4 lb

## 2012-07-03 DIAGNOSIS — M549 Dorsalgia, unspecified: Secondary | ICD-10-CM

## 2012-07-03 DIAGNOSIS — S39012A Strain of muscle, fascia and tendon of lower back, initial encounter: Secondary | ICD-10-CM | POA: Insufficient documentation

## 2012-07-03 DIAGNOSIS — E785 Hyperlipidemia, unspecified: Secondary | ICD-10-CM

## 2012-07-03 DIAGNOSIS — I10 Essential (primary) hypertension: Secondary | ICD-10-CM

## 2012-07-03 LAB — HEPATIC FUNCTION PANEL
ALT: 20 U/L (ref 0–35)
AST: 21 U/L (ref 0–37)
Albumin: 4.3 g/dL (ref 3.5–5.2)
Alkaline Phosphatase: 51 U/L (ref 39–117)
Bilirubin, Direct: 0 mg/dL (ref 0.0–0.3)
Total Protein: 7.2 g/dL (ref 6.0–8.3)

## 2012-07-03 LAB — LIPID PANEL: Total CHOL/HDL Ratio: 3

## 2012-07-03 LAB — BASIC METABOLIC PANEL
CO2: 30 mEq/L (ref 19–32)
Chloride: 101 mEq/L (ref 96–112)
Glucose, Bld: 77 mg/dL (ref 70–99)
Sodium: 139 mEq/L (ref 135–145)

## 2012-07-03 MED ORDER — LEVOTHYROXINE SODIUM 50 MCG PO TABS: 50.0000 ug | ORAL_TABLET | Freq: Every day | ORAL | Status: DC

## 2012-07-03 MED ORDER — ATORVASTATIN CALCIUM 20 MG PO TABS: 20.0000 mg | ORAL_TABLET | Freq: Every day | ORAL | Status: DC

## 2012-07-03 MED ORDER — AMLODIPINE BESYLATE 5 MG PO TABS: 5.0000 mg | ORAL_TABLET | Freq: Every day | ORAL | Status: DC

## 2012-07-03 NOTE — Patient Instructions (Addendum)
Schedule your complete physical in 6 months We'll notify you of your lab results and make any changes if needed The back discomfort is a paraspinal muscle strain- heat, ibuprofen, engage your core (planks are very helpful) Call with any questions or concerns Happy Spring!!

## 2012-07-03 NOTE — Assessment & Plan Note (Signed)
Chronic problem.  Tolerating statin w/out difficulty.  Check labs.  Adjust meds prn  

## 2012-07-03 NOTE — Assessment & Plan Note (Signed)
New.  No red flags on hx or PE.  NSAIDs prn.  Stressed importance of core strengthening.  Heat prn.  Reviewed supportive care and red flags that should prompt return.  Pt expressed understanding and is in agreement w/ plan.

## 2012-07-03 NOTE — Assessment & Plan Note (Signed)
Chronic problem, adequate control.  Asymptomatic.  No med changes.  Will follow.

## 2012-07-03 NOTE — Progress Notes (Signed)
  Subjective:    Patient ID: [name], female    DOB: Nov 05, 1945, 67 y.o.   MRN: [id number]  HPI HTN- chronic problem, on amlodipine.  Adequate control.  Denies CP, SOB, HAs, visual changes, edema.  Hyperlipidemia- chronic problem, on Lipitor nightly.  Denies abd pain, N/V, myalgias.  LBP- pt reports stiffness, worse w/ movement.  Has increased her stretching recently.  Pain is bilateral, lumbar paraspinal.  Discomfort bending forward.  No discomfort w/ extension.  sxs started 2-3 months ago.  Pain has improved since she increased her stretching.  Pain will rarely radiate into hips.   Review of Systems For ROS see HPI     Objective:   Physical Exam  Vitals reviewed. Constitutional: She is oriented to person, place, and time. She appears well-developed and well-nourished. No distress.  HENT:  Head: Normocephalic and atraumatic.  Eyes: Conjunctivae and EOM are normal. Pupils are equal, round, and reactive to light.  Neck: Normal range of motion. Neck supple. No thyromegaly present.  Cardiovascular: Normal rate, regular rhythm, normal heart sounds and intact distal pulses.   No murmur heard. Pulmonary/Chest: Effort normal and breath sounds normal. No respiratory distress.  Abdominal: Soft. She exhibits no distension. There is no tenderness.  Musculoskeletal: She exhibits no edema.  (-) SLR bilaterally Mild discomfort w/ forward flexion No pain w/ extension Mild TTP over lumbar paraspinal muscle w/ spasm present  Lymphadenopathy:    She has no cervical adenopathy.  Neurological: She is alert and oriented to person, place, and time.  Skin: Skin is warm and dry.  Psychiatric: She has a normal mood and affect. Her behavior is normal.          Assessment & Plan:

## 2012-07-22 ENCOUNTER — Encounter: Payer: Self-pay | Admitting: Family Medicine

## 2012-12-15 ENCOUNTER — Encounter: Payer: Self-pay | Admitting: Family Medicine

## 2012-12-16 MED ORDER — LEVOTHYROXINE SODIUM 50 MCG PO TABS: 50.0000 ug | ORAL_TABLET | Freq: Every day | ORAL | Status: DC

## 2012-12-16 NOTE — Telephone Encounter (Signed)
Med filled to mail order.  

## 2012-12-17 LAB — HM MAMMOGRAPHY

## 2012-12-23 ENCOUNTER — Encounter: Payer: Self-pay | Admitting: Family Medicine

## 2012-12-25 ENCOUNTER — Encounter: Payer: Self-pay | Admitting: Family Medicine

## 2012-12-25 MED ORDER — ATORVASTATIN CALCIUM 20 MG PO TABS: 20.0000 mg | ORAL_TABLET | Freq: Every day | ORAL | Status: DC

## 2012-12-25 MED ORDER — AMLODIPINE BESYLATE 5 MG PO TABS: 5.0000 mg | ORAL_TABLET | Freq: Every day | ORAL | Status: DC

## 2012-12-25 NOTE — Telephone Encounter (Signed)
Med filled.  

## 2013-01-02 ENCOUNTER — Telehealth: Payer: Self-pay

## 2013-01-02 NOTE — Telephone Encounter (Signed)
Medication and allergies: reviewed and updated  90 day supply/mail order: [facility] Local pharmacy: [facility] [place]   Immunizations due: UTD  A/P:  No changes to [name] or FH MMG--12/2012--neg findings CCS--12/2009--neg findings next 2021--Dr Edwards Tdap---12/2010 Shingles---02/2007 Pneumonia--12/2010 Dexa--01/2012--osteoporosis--repeat 2 years  To Discuss with Provider: Rash/bumps/itching on legs---sees Dr Emily Filbert Would like provider to advise if she should go to derm.

## 2013-01-06 ENCOUNTER — Encounter: Payer: Self-pay | Admitting: Family Medicine

## 2013-01-06 ENCOUNTER — Telehealth: Payer: Self-pay | Admitting: Family Medicine

## 2013-01-06 ENCOUNTER — Ambulatory Visit (INDEPENDENT_AMBULATORY_CARE_PROVIDER_SITE_OTHER): Payer: Medicare Other | Admitting: Family Medicine

## 2013-01-06 VITALS — BP 124/80 | HR 80 | Temp 98.2°F | Resp 16 | Ht 61.0 in | Wt 121.4 lb

## 2013-01-06 DIAGNOSIS — I10 Essential (primary) hypertension: Secondary | ICD-10-CM

## 2013-01-06 DIAGNOSIS — E785 Hyperlipidemia, unspecified: Secondary | ICD-10-CM

## 2013-01-06 DIAGNOSIS — R21 Rash and other nonspecific skin eruption: Secondary | ICD-10-CM

## 2013-01-06 DIAGNOSIS — E039 Hypothyroidism, unspecified: Secondary | ICD-10-CM

## 2013-01-06 DIAGNOSIS — M810 Age-related osteoporosis without current pathological fracture: Secondary | ICD-10-CM

## 2013-01-06 DIAGNOSIS — Z0000 Encounter for general adult medical examination without abnormal findings: Secondary | ICD-10-CM

## 2013-01-06 LAB — CBC WITH DIFFERENTIAL/PLATELET
Basophils Absolute: 0 10*3/uL (ref 0.0–0.1)
HCT: 42.3 % (ref 36.0–46.0)
Lymphs Abs: 1.7 10*3/uL (ref 0.7–4.0)
MCHC: 34 g/dL (ref 30.0–36.0)
Monocytes Relative: 3.9 % (ref 3.0–12.0)
Platelets: 303 10*3/uL (ref 150.0–400.0)
RDW: 13.6 % (ref 11.5–14.6)

## 2013-01-06 LAB — HEPATIC FUNCTION PANEL
ALT: 21 U/L (ref 0–35)
AST: 20 U/L (ref 0–37)
Albumin: 4.2 g/dL (ref 3.5–5.2)
Alkaline Phosphatase: 46 U/L (ref 39–117)
Bilirubin, Direct: 0 mg/dL (ref 0.0–0.3)
Total Bilirubin: 0.8 mg/dL (ref 0.3–1.2)
Total Protein: 7.1 g/dL (ref 6.0–8.3)

## 2013-01-06 LAB — LIPID PANEL
HDL: 66.4 mg/dL (ref >39.00)
LDL Cholesterol: 90 mg/dL (ref 0–99)
Total CHOL/HDL Ratio: 3
Triglycerides: 60 mg/dL (ref 0.0–149.0)
VLDL: 12 mg/dL (ref 0.0–40.0)

## 2013-01-06 LAB — BASIC METABOLIC PANEL
CO2: 29 mEq/L (ref 19–32)
Calcium: 9.2 mg/dL (ref 8.4–10.5)
GFR: 79.39 mL/min (ref >60.00)
Glucose, Bld: 88 mg/dL (ref 70–99)
Potassium: 3.5 mEq/L (ref 3.5–5.1)
Sodium: 140 mEq/L (ref 135–145)

## 2013-01-06 MED ORDER — PERMETHRIN 5 % EX CREA: TOPICAL_CREAM | Freq: Once | CUTANEOUS | Status: DC

## 2013-01-06 MED ORDER — TRIAMCINOLONE ACETONIDE 0.1 % EX OINT: TOPICAL_OINTMENT | Freq: Two times a day (BID) | CUTANEOUS | Status: DC

## 2013-01-06 NOTE — Telephone Encounter (Signed)
Called pt to verify what medications pt needs to have sent to [facility]. LMOVM to return call.

## 2013-01-06 NOTE — Progress Notes (Signed)
  Subjective:    Patient ID: [name], female    DOB: 10-13-1945, 67 y.o.   MRN: [id number]  HPI Here today for CPE.  Risk Factors: HTN- chronic problem, well controlled on Amlodipine.  No CP, SOB, HAs, visual changes, edema. Hyperlipidemia- chronic problem, on Lipitor daily.  Denies abd pain, N/V, myalgias Hypothyroid- chronic problem, on Levothyroxine daily.  Denies excessive fatigue Osteoporosis- chronic problem, had Reclast in May.  On Calcium and Vit d daily Skin Rash- noted while gardening this summer.  Now w/ lesion on L anterior lower leg.  Has hx of squamous cell.  All areas are very itchy.  No close contacts are itching. Physical Activity: exercising regularly Fall Risk: low risk Depression: denies current sxs Hearing: normal to conversational tones and whispered voice at 6 ft ADL's: independent Cognitive: normal linear thought process, memory and attention intact Home Safety: safe at home Height, Weight, BMI, Visual Acuity: see vitals, vision corrected to 20/20 w/ glasses Counseling: UTD on colonoscopy, mammo, DEXA.  No need for paps Labs Ordered: See A&P Care Plan: See A&P    Review of Systems Patient reports no vision/ hearing changes, adenopathy,fever, weight change,  persistant/recurrent hoarseness , swallowing issues, chest pain, palpitations, edema, persistant/recurrent cough, hemoptysis, dyspnea (rest/exertional/paroxysmal nocturnal), gastrointestinal bleeding (melena, rectal bleeding), abdominal pain, significant heartburn, bowel changes, GU symptoms (dysuria, hematuria, incontinence), Gyn symptoms (abnormal  bleeding, pain),  syncope, focal weakness, memory loss, numbness & tingling, hair/nail changes, abnormal bruising or bleeding, anxiety, or depression.     Objective:   Physical Exam General Appearance:    Alert, cooperative, no distress, appears stated age  Head:    Normocephalic, without obvious abnormality, atraumatic  Eyes:    PERRL, conjunctiva/corneas  clear, EOM's intact, fundi    benign, both eyes  Ears:    Normal TM's and external ear canals, both ears  Nose:   Nares normal, septum midline, mucosa normal, no drainage    or sinus tenderness  Throat:   Lips, mucosa, and tongue normal; teeth and gums normal  Neck:   Supple, symmetrical, trachea midline, no adenopathy;    Thyroid: no enlargement/tenderness/nodules  Back:     Symmetric, no curvature, ROM normal, no CVA tenderness  Lungs:     Clear to auscultation bilaterally, respirations unlabored  Chest Wall:    No tenderness or deformity   Heart:    Regular rate and rhythm, S1 and S2 normal, no murmur, rub   or gallop  Breast Exam:    Deferred to mammo  Abdomen:     Soft, non-tender, bowel sounds active all four quadrants,    no masses, no organomegaly  Genitalia:    Deferred  Rectal:    Extremities:   Extremities normal, atraumatic, no cyanosis or edema  Pulses:   2+ and symmetric all extremities  Skin:   Skin color, texture, turgor normal, scattered excoriations on arms, back, legs.  2 cm pink papule on L anterior lower leg  Lymph nodes:   Cervical, supraclavicular, and axillary nodes normal  Neurologic:   CNII-XII intact, normal strength, sensation and reflexes    throughout          Assessment & Plan:

## 2013-01-06 NOTE — Telephone Encounter (Signed)
Med filled.  

## 2013-01-06 NOTE — Telephone Encounter (Signed)
Patient called and stated that she thought her rx's was going to be called into [facility] because [facility] takes a while and she needed the rx's now. Patient wanted to know was there anyway that it can be called into [facility].    Pharmacy [facility] [address] [place]

## 2013-01-06 NOTE — Assessment & Plan Note (Signed)
Chronic problem.  Check labs.  Adjust meds prn  

## 2013-01-06 NOTE — Assessment & Plan Note (Signed)
Chronic problem, well controlled on current meds.  Asymptomatic.  Check labs.  No anticipated med changes. 

## 2013-01-06 NOTE — Assessment & Plan Note (Signed)
Chronic problem.  Tolerating statin w/out difficulty.  Check labs.  Adjust meds prn  

## 2013-01-06 NOTE — Assessment & Plan Note (Signed)
Chronic problem.  Check Vit D level.

## 2013-01-06 NOTE — Patient Instructions (Signed)
Follow up in 6 months to recheck BP and cholesterol We'll notify you of your lab results and make any changes if needed Apply the Triamcinolone twice daily as needed for itching Use benadryl at night for itch relief Apply the Elimite cream 1 time- apply from neck down, sleep in it, and then wash it off in the morning Wash all sheets, towels, and clothes after treatment Touch base w/ Dr Emily Filbert for a skin check Call with any questions or concerns Happy Holidays!

## 2013-01-06 NOTE — Assessment & Plan Note (Signed)
Pt's PE WNL w/ exception of skin lesions.  UTD on health maintenance.  Check labs.  Anticipatory guidance provided.

## 2013-01-06 NOTE — Assessment & Plan Note (Signed)
New.  Given excoriations in the absence of obvious rash will start Elimite for possible scabies.  Start Triamcinolone for itching.  Pt to f/u w/ derm.  Will follow.

## 2013-01-09 LAB — VITAMIN D 1,25 DIHYDROXY
Vitamin D 1, 25 (OH)2 Total: 64 pg/mL (ref 18–72)
Vitamin D3 1, 25 (OH)2: 64 pg/mL

## 2013-01-09 MED ORDER — LEVOTHYROXINE SODIUM 50 MCG PO TABS: 50.0000 ug | ORAL_TABLET | Freq: Every day | ORAL | Status: DC

## 2013-01-09 MED ORDER — AMLODIPINE BESYLATE 5 MG PO TABS: 5.0000 mg | ORAL_TABLET | Freq: Every day | ORAL | Status: DC

## 2013-01-09 MED ORDER — ATORVASTATIN CALCIUM 20 MG PO TABS: 20.0000 mg | ORAL_TABLET | Freq: Every day | ORAL | Status: DC

## 2013-01-09 NOTE — Telephone Encounter (Signed)
Cannot reach pt, sent in maintenance medications to [facility].

## 2013-01-17 ENCOUNTER — Other Ambulatory Visit (INDEPENDENT_AMBULATORY_CARE_PROVIDER_SITE_OTHER): Payer: Medicare Other

## 2013-01-17 DIAGNOSIS — Z0000 Encounter for general adult medical examination without abnormal findings: Secondary | ICD-10-CM

## 2013-01-17 DIAGNOSIS — Z1289 Encounter for screening for malignant neoplasm of other sites: Secondary | ICD-10-CM

## 2013-01-17 LAB — HEMOCCULT GUIAC POC 1CARD (OFFICE)
Card #2 Fecal Occult Blod, POC: NEGATIVE
Fecal Occult Blood, POC: NEGATIVE

## 2013-03-06 ENCOUNTER — Encounter: Payer: Self-pay | Admitting: Family Medicine

## 2013-03-11 ENCOUNTER — Encounter: Payer: Self-pay | Admitting: Family Medicine

## 2013-03-12 MED ORDER — LEVOTHYROXINE SODIUM 50 MCG PO TABS: 50.0000 ug | ORAL_TABLET | Freq: Every day | ORAL | Status: DC

## 2013-03-12 NOTE — Telephone Encounter (Signed)
Med filled.  

## 2013-03-13 MED ORDER — ATORVASTATIN CALCIUM 20 MG PO TABS: 20.0000 mg | ORAL_TABLET | Freq: Every day | ORAL | Status: DC

## 2013-03-13 MED ORDER — AMLODIPINE BESYLATE 5 MG PO TABS: 5.0000 mg | ORAL_TABLET | Freq: Every day | ORAL | Status: DC

## 2013-03-13 NOTE — Telephone Encounter (Signed)
Meds filled

## 2013-03-13 NOTE — Addendum Note (Signed)
Addended by: Kris Hartmann on: 03/13/2013 09:59 AM   Modules accepted: Orders

## 2013-04-01 ENCOUNTER — Encounter: Payer: Self-pay | Admitting: General Practice

## 2013-04-05 ENCOUNTER — Encounter: Payer: Self-pay | Admitting: Family Medicine

## 2013-04-08 ENCOUNTER — Encounter: Payer: Self-pay | Admitting: General Practice

## 2013-04-10 ENCOUNTER — Telehealth: Payer: Self-pay | Admitting: *Deleted

## 2013-04-10 NOTE — Telephone Encounter (Signed)
Reclast was approved from February 2015 until February 2016. Approval number is [id number]

## 2013-04-14 ENCOUNTER — Other Ambulatory Visit: Payer: Self-pay | Admitting: Family Medicine

## 2013-04-14 DIAGNOSIS — M810 Age-related osteoporosis without current pathological fracture: Secondary | ICD-10-CM

## 2013-04-15 ENCOUNTER — Other Ambulatory Visit (INDEPENDENT_AMBULATORY_CARE_PROVIDER_SITE_OTHER): Payer: Medicare HMO

## 2013-04-15 DIAGNOSIS — M810 Age-related osteoporosis without current pathological fracture: Secondary | ICD-10-CM

## 2013-04-15 LAB — BASIC METABOLIC PANEL
BUN: 14 mg/dL (ref 6–23)
CHLORIDE: 103 meq/L (ref 96–112)
CO2: 28 meq/L (ref 19–32)
Calcium: 8.8 mg/dL (ref 8.4–10.5)
Creatinine, Ser: 0.8 mg/dL (ref 0.4–1.2)
GFR: 73.77 mL/min (ref >60.00)
Glucose, Bld: 94 mg/dL (ref 70–99)
POTASSIUM: 3.4 meq/L — AB (ref 3.5–5.1)
Sodium: 139 mEq/L (ref 135–145)

## 2013-04-18 ENCOUNTER — Encounter: Payer: Self-pay | Admitting: Family Medicine

## 2013-04-21 ENCOUNTER — Encounter: Payer: Self-pay | Admitting: Family Medicine

## 2013-04-23 ENCOUNTER — Other Ambulatory Visit: Payer: Self-pay | Admitting: Family Medicine

## 2013-04-23 DIAGNOSIS — M810 Age-related osteoporosis without current pathological fracture: Secondary | ICD-10-CM

## 2013-05-06 ENCOUNTER — Encounter (HOSPITAL_COMMUNITY): Payer: Self-pay

## 2013-05-06 ENCOUNTER — Ambulatory Visit (HOSPITAL_COMMUNITY)
Admission: RE | Admit: 2013-05-06 | Discharge: 2013-05-06 | Disposition: A | Discharge status: Home / Self Care | Payer: Medicare HMO | Source: Ambulatory Visit | Attending: Family Medicine | Admitting: Family Medicine

## 2013-05-06 ENCOUNTER — Other Ambulatory Visit (HOSPITAL_COMMUNITY): Payer: Self-pay | Admitting: Family Medicine

## 2013-05-06 VITALS — BP 153/70 | HR 73 | Temp 98.2°F | Resp 18

## 2013-05-06 DIAGNOSIS — M810 Age-related osteoporosis without current pathological fracture: Secondary | ICD-10-CM | POA: Insufficient documentation

## 2013-05-06 MED ORDER — ZOLEDRONIC ACID 5 MG/100ML IV SOLN
5.0000 mg | Freq: Once | INTRAVENOUS | Status: AC
  Administered 2013-05-06: 5 mg via INTRAVENOUS
  Filled 2013-05-06: qty 100

## 2013-05-06 MED ORDER — SODIUM CHLORIDE 0.9 % IV SOLN
Freq: Once | INTRAVENOUS | Status: AC
  Administered 2013-05-06: 14:00:00 via INTRAVENOUS

## 2013-05-06 NOTE — Discharge Instructions (Signed)

## 2013-05-20 ENCOUNTER — Emergency Department (HOSPITAL_BASED_OUTPATIENT_CLINIC_OR_DEPARTMENT_OTHER)
Admission: EM | Admit: 2013-05-20 | Discharge: 2013-05-20 | Disposition: A | Discharge status: Home / Self Care | Payer: Medicare HMO | Attending: Emergency Medicine | Admitting: Emergency Medicine

## 2013-05-20 ENCOUNTER — Encounter (HOSPITAL_BASED_OUTPATIENT_CLINIC_OR_DEPARTMENT_OTHER): Payer: Self-pay | Admitting: Emergency Medicine

## 2013-05-20 ENCOUNTER — Telehealth: Payer: Self-pay | Admitting: General Practice

## 2013-05-20 DIAGNOSIS — Z7982 Long term (current) use of aspirin: Secondary | ICD-10-CM | POA: Insufficient documentation

## 2013-05-20 DIAGNOSIS — S81009A Unspecified open wound, unspecified knee, initial encounter: Secondary | ICD-10-CM | POA: Insufficient documentation

## 2013-05-20 DIAGNOSIS — T148XXA Other injury of unspecified body region, initial encounter: Secondary | ICD-10-CM

## 2013-05-20 DIAGNOSIS — W278XXA Contact with other nonpowered hand tool, initial encounter: Secondary | ICD-10-CM | POA: Insufficient documentation

## 2013-05-20 DIAGNOSIS — S91009A Unspecified open wound, unspecified ankle, initial encounter: Principal | ICD-10-CM

## 2013-05-20 DIAGNOSIS — Z79899 Other long term (current) drug therapy: Secondary | ICD-10-CM | POA: Insufficient documentation

## 2013-05-20 DIAGNOSIS — M810 Age-related osteoporosis without current pathological fracture: Secondary | ICD-10-CM | POA: Insufficient documentation

## 2013-05-20 DIAGNOSIS — E039 Hypothyroidism, unspecified: Secondary | ICD-10-CM | POA: Insufficient documentation

## 2013-05-20 DIAGNOSIS — IMO0002 Reserved for concepts with insufficient information to code with codable children: Secondary | ICD-10-CM | POA: Insufficient documentation

## 2013-05-20 DIAGNOSIS — I10 Essential (primary) hypertension: Secondary | ICD-10-CM | POA: Insufficient documentation

## 2013-05-20 DIAGNOSIS — Y929 Unspecified place or not applicable: Secondary | ICD-10-CM | POA: Insufficient documentation

## 2013-05-20 DIAGNOSIS — E785 Hyperlipidemia, unspecified: Secondary | ICD-10-CM | POA: Insufficient documentation

## 2013-05-20 DIAGNOSIS — S81809A Unspecified open wound, unspecified lower leg, initial encounter: Principal | ICD-10-CM

## 2013-05-20 DIAGNOSIS — Y9389 Activity, other specified: Secondary | ICD-10-CM | POA: Insufficient documentation

## 2013-05-20 MED ORDER — CEPHALEXIN 500 MG PO CAPS: 500.0000 mg | ORAL_CAPSULE | Freq: Four times a day (QID) | ORAL | Status: DC

## 2013-05-20 NOTE — Telephone Encounter (Signed)
Agree w/ advice given 

## 2013-05-20 NOTE — Telephone Encounter (Signed)
Pt called and spoke with [facility] originally. Charity called my extension to advise that pt was on the phone inquiring if her Tetanus vaccination was up to date. [facility] that is was and then I inquired as to what had happened to the patient. Charity advised that pt had been working in the yard and had struck herself in the leg with a pitchfork, pt stated that "the bleeding had stopped". Advised that yes tetanus was up to date however that the pt needed to be seen since the pitchfork was probably dirty, and that there was a high risk of infection due to the dirty tines breaking the skin.   [place] to notify pt to seek treatment with our office, UC, or hospital

## 2013-05-20 NOTE — Telephone Encounter (Signed)
Called the patient's home number, left message on voice mail that if injury caused a puncture wound of any type she needed to seek treatment at an UC

## 2013-05-20 NOTE — ED Notes (Signed)
Puncture wound to left shin by pitch fork 1 hr ago.  Was unloading lawn debris from truck.

## 2013-05-20 NOTE — ED Provider Notes (Addendum)
CSN: [id number]     Arrival date & time 05/20/13  1715 History   First MD Initiated Contact with Patient 05/20/13 1728     Chief Complaint  Patient presents with  . Puncture Wound     (Consider location/radiation/quality/duration/timing/severity/associated sxs/prior Treatment) Patient is a 68 y.o. female presenting with leg pain. The history is provided by the patient.  Leg Pain Location:  Leg Time since incident:  1 hour Injury: yes   Mechanism of injury comment:  Puncture wound from pitchfork in the left shin while scooping yard debris Leg location:  L lower leg Pain details:    Quality:  Aching   Radiates to:  Does not radiate   Severity:  Mild   Onset quality:  Sudden   Timing:  Constant   Progression:  Unchanged Chronicity:  New Foreign body present:  Unable to specify Tetanus status:  Up to date Prior injury to area:  No Relieved by:  None tried Worsened by:  Nothing tried Associated symptoms: no numbness, no swelling and no tingling   Associated symptoms comment:  Washed with soap and water and bleeding has stopped.  Called her doctor and tetanus is UTD but they were not able to see her today.   Past Medical History  Diagnosis Date  . Hyperlipidemia   . Hypertension   . Osteoporosis   . Hypothyroidism    Past Surgical History  Procedure Laterality Date  . Abdominal hysterectomy    . Breast lumpectomy      x2 left breast  . Tonsillectomy    . Squamous cell carcinoma excision      RLE   Family History  Problem Relation Age of Onset  . Heart disease Mother     congenital heart defect  . Cancer Father     kidney cancer  . Cancer Maternal Aunt     breast cancer  . Cancer Paternal Aunt     breast cancer   History  Substance Use Topics  . Smoking status: Never Smoker   . Smokeless tobacco: Not on file  . Alcohol Use: No   OB History   Grav Para Term Preterm Abortions TAB SAB Ect Mult Living                 Review of Systems  All other systems  reviewed and are negative.      Allergies  Moxifloxacin  Home Medications   Current Outpatient Rx  Name  Route  Sig  Dispense  Refill  . amLODipine (NORVASC) 5 MG tablet   Oral   Take 1 tablet (5 mg total) by mouth daily.   90 tablet   1   . aspirin 81 MG EC tablet   Oral   Take 81 mg by mouth daily.           Marland Kitchen atorvastatin (LIPITOR) 20 MG tablet   Oral   Take 1 tablet (20 mg total) by mouth daily.   90 tablet   1   . calcium citrate-vitamin D (CITRACAL+D) 315-200 MG-UNIT per tablet   Oral   Take 1 tablet by mouth. 600 mg bid         . Cholecalciferol (VITAMIN D PO)   Oral   Take 1,000 Int'l Units by mouth 2 (two) times daily.         . cycloSPORINE (RESTASIS) 0.05 % ophthalmic emulsion      1 drop 2 (two) times daily.           Marland Kitchen  levothyroxine (SYNTHROID, LEVOTHROID) 50 MCG tablet   Oral   Take 1 tablet (50 mcg total) by mouth daily.   90 tablet   1   . Multiple Vitamin (MULTIVITAMIN) tablet   Oral   Take 1 tablet by mouth daily.           . permethrin (ELIMITE) 5 % cream   Topical   Apply topically once.   60 g   0   . triamcinolone ointment (KENALOG) 0.1 %   Topical   Apply topically 2 (two) times daily.   90 g   1   . vitamin E 400 UNIT capsule   Oral   Take 400 Units by mouth daily.         . zoledronic acid (RECLAST) 5 MG/100ML SOLN   Intravenous   Inject 5 mg into the vein once.            BP 185/71  Pulse 77  Temp(Src) 98.2 F (36.8 C) (Oral)  Resp 16  Ht 5\' 1"  (1.549 m)  Wt 122 lb (55.339 kg)  BMI 23.06 kg/m2  SpO2 99% Physical Exam  Nursing note and vitals reviewed. Constitutional: She is oriented to person, place, and time. She appears well-developed and well-nourished. No distress.  Cardiovascular: Normal rate.   Pulmonary/Chest: Effort normal.  Musculoskeletal:  Small puncture wound to the left mid tib/fib.  Hemostatic and in exploring the wound does not appear to be deep and no signs of foreign body.   Neurological: She is alert and oriented to person, place, and time.  Skin: Skin is warm and dry.  Psychiatric: She has a normal mood and affect. Her behavior is normal.    ED Course  Procedures (including critical care time) Labs Review Labs Reviewed - No data to display Imaging Review No results found.   EKG Interpretation None      MDM   Final diagnoses:  Puncture wound    Pt with puncture wound to the left shin by contaminated pitch fork.  Patient is able to ambulate and low suspicion for bony injury. The wounds are gaping clean and is well-appearing. Tetanus shot is up-to-date. Will cover with Keflex and given instructions about cleaning and wound care.    Blanchie Dessert, MD 05/20/13 1742  Blanchie Dessert, MD 05/20/13 1743

## 2013-06-25 ENCOUNTER — Other Ambulatory Visit: Payer: Self-pay | Admitting: Dermatology

## 2013-07-05 ENCOUNTER — Encounter: Payer: Self-pay | Admitting: Family Medicine

## 2013-07-07 MED ORDER — LEVOTHYROXINE SODIUM 50 MCG PO TABS: 50.0000 ug | ORAL_TABLET | Freq: Every day | ORAL | Status: DC

## 2013-07-07 MED ORDER — ATORVASTATIN CALCIUM 20 MG PO TABS: 20.0000 mg | ORAL_TABLET | Freq: Every day | ORAL | Status: DC

## 2013-07-07 MED ORDER — AMLODIPINE BESYLATE 5 MG PO TABS: 5.0000 mg | ORAL_TABLET | Freq: Every day | ORAL | Status: DC

## 2013-07-07 NOTE — Telephone Encounter (Signed)
meds filled

## 2013-07-10 ENCOUNTER — Ambulatory Visit: Payer: Medicare Other | Admitting: Family Medicine

## 2013-07-14 ENCOUNTER — Ambulatory Visit (INDEPENDENT_AMBULATORY_CARE_PROVIDER_SITE_OTHER): Payer: Medicare HMO | Admitting: Family Medicine

## 2013-07-14 ENCOUNTER — Encounter: Payer: Self-pay | Admitting: Family Medicine

## 2013-07-14 VITALS — BP 128/76 | HR 65 | Temp 98.2°F | Resp 16 | Wt 122.5 lb

## 2013-07-14 DIAGNOSIS — M25552 Pain in left hip: Secondary | ICD-10-CM

## 2013-07-14 DIAGNOSIS — M25559 Pain in unspecified hip: Secondary | ICD-10-CM

## 2013-07-14 DIAGNOSIS — E785 Hyperlipidemia, unspecified: Secondary | ICD-10-CM

## 2013-07-14 DIAGNOSIS — I10 Essential (primary) hypertension: Secondary | ICD-10-CM

## 2013-07-14 LAB — HEPATIC FUNCTION PANEL
ALT: 19 U/L (ref 0–35)
AST: 23 U/L (ref 0–37)
Albumin: 4.2 g/dL (ref 3.5–5.2)
Alkaline Phosphatase: 39 U/L (ref 39–117)
BILIRUBIN TOTAL: 0.5 mg/dL (ref 0.2–1.2)
Bilirubin, Direct: 0 mg/dL (ref 0.0–0.3)
Total Protein: 6.7 g/dL (ref 6.0–8.3)

## 2013-07-14 LAB — BASIC METABOLIC PANEL
BUN: 11 mg/dL (ref 6–23)
CO2: 27 mEq/L (ref 19–32)
Calcium: 9.3 mg/dL (ref 8.4–10.5)
Chloride: 101 mEq/L (ref 96–112)
Creatinine, Ser: 0.7 mg/dL (ref 0.4–1.2)
GFR: 84.3 mL/min (ref >60.00)
GLUCOSE: 84 mg/dL (ref 70–99)
Potassium: 3.5 mEq/L (ref 3.5–5.1)
Sodium: 138 mEq/L (ref 135–145)

## 2013-07-14 LAB — LIPID PANEL
CHOLESTEROL: 153 mg/dL (ref 0–200)
HDL: 53.4 mg/dL (ref >39.00)
LDL Cholesterol: 69 mg/dL (ref 0–99)
Total CHOL/HDL Ratio: 3
Triglycerides: 153 mg/dL — ABNORMAL HIGH (ref 0.0–149.0)
VLDL: 30.6 mg/dL (ref 0.0–40.0)

## 2013-07-14 NOTE — Assessment & Plan Note (Signed)
Chronic problem.  Well controlled.  Asymptomatic.  Check labs.  No anticipated med changes. 

## 2013-07-14 NOTE — Assessment & Plan Note (Signed)
New to provider, ongoing intermittent problem for pt.  Offered daily NSAID (mobic)- pt declined.  Offered sports med evaluation- pt declined.  Encouraged pt to revisit this issue if needed.  Will follow.

## 2013-07-14 NOTE — Progress Notes (Signed)
   Subjective:    Patient ID: [name], female    DOB: November 04, 1945, 68 y.o.   MRN: [id number]  HPI HTN- chronic problem, on Amlodipine.  Denies CP, SOB, HAs, visual changes, edema.    Hyperlipidemia- chronic problem, on Lipitor.  Denies abd pain, N/V, myalgias.  L buttock pain- painful to lie on L side, now waking pt at night.  Will radiate down back of leg.  Pt feels this is arthritis.  Taking ibuprofen prior to bed w/ some relief.  Doing some stretching w/ some relief.   Review of Systems For ROS see HPI     Objective:   Physical Exam  Vitals reviewed. Constitutional: She is oriented to person, place, and time. She appears well-developed and well-nourished. No distress.  HENT:  Head: Normocephalic and atraumatic.  Eyes: Conjunctivae and EOM are normal. Pupils are equal, round, and reactive to light.  Neck: Normal range of motion. Neck supple. No thyromegaly present.  Cardiovascular: Normal rate, regular rhythm, normal heart sounds and intact distal pulses.   No murmur heard. Pulmonary/Chest: Effort normal and breath sounds normal. No respiratory distress.  Abdominal: Soft. She exhibits no distension. There is no tenderness.  Musculoskeletal: She exhibits no edema.  Lymphadenopathy:    She has no cervical adenopathy.  Neurological: She is alert and oriented to person, place, and time.  Skin: Skin is warm and dry.  Psychiatric: She has a normal mood and affect. Her behavior is normal.          Assessment & Plan:

## 2013-07-14 NOTE — Patient Instructions (Addendum)
Schedule your complete physical in 6 months We'll notify you of your lab results and make any changes if needed Ibuprofen as needed for hip pain Call with any questions or concerns Keep up the good work! Happy [place] Mother[place] Day!

## 2013-07-14 NOTE — Assessment & Plan Note (Signed)
Chronic problem.  Tolerating statin w/o difficulty.  Check labs.  Adjust meds prn  

## 2013-07-14 NOTE — Progress Notes (Signed)
Pre visit review using our clinic review tool, if applicable. No additional management support is needed unless otherwise documented below in the visit note. 

## 2013-07-21 ENCOUNTER — Ambulatory Visit (INDEPENDENT_AMBULATORY_CARE_PROVIDER_SITE_OTHER): Payer: Medicare HMO | Admitting: Family Medicine

## 2013-07-21 ENCOUNTER — Encounter: Payer: Self-pay | Admitting: Family Medicine

## 2013-07-21 VITALS — BP 130/72 | HR 70 | Temp 98.5°F | Resp 17 | Wt 122.2 lb

## 2013-07-21 DIAGNOSIS — J019 Acute sinusitis, unspecified: Secondary | ICD-10-CM

## 2013-07-21 MED ORDER — PROMETHAZINE-DM 6.25-15 MG/5ML PO SYRP: 5.0000 mL | ORAL_SOLUTION | Freq: Four times a day (QID) | ORAL | Status: DC | PRN

## 2013-07-21 MED ORDER — AMOXICILLIN 875 MG PO TABS: 875.0000 mg | ORAL_TABLET | Freq: Two times a day (BID) | ORAL | Status: DC

## 2013-07-21 NOTE — Patient Instructions (Signed)
Follow up as needed Start the Amoxicillin twice daily for sinusitis Use the cough syrup as needed- will cause drowsiness Robitussin or Delsym for daytime cough Drink plenty of fluids Start Claritin or Zyrtec for the allergy component REST! Hang in there!!

## 2013-07-21 NOTE — Progress Notes (Signed)
   Subjective:    Patient ID: [name], female    DOB: 04-Jun-1945, 68 y.o.   MRN: [id number]  Cough   URI- pt initially thought sxs were allergies.  sxs started 5 days ago.  No fevers.  + hoarseness, nasal congestion, PND, eye drainage this AM, facial pressure.  No tooth pain.  No N/V/D.  No ear pain.  + dry cough.  Took benadryl and coricidin w/o relief.  Started Mucinex on Friday.   Review of Systems  Respiratory: Positive for cough.    For ROS see HPI     Objective:   Physical Exam  Vitals reviewed. Constitutional: She appears well-developed and well-nourished. No distress.  HENT:  Head: Normocephalic and atraumatic.  Right Ear: Tympanic membrane normal.  Left Ear: Tympanic membrane normal.  Nose: Mucosal edema and rhinorrhea present. Right sinus exhibits maxillary sinus tenderness and frontal sinus tenderness. Left sinus exhibits maxillary sinus tenderness and frontal sinus tenderness.  Mouth/Throat: Uvula is midline and mucous membranes are normal. Posterior oropharyngeal erythema present. No oropharyngeal exudate.  Eyes: Conjunctivae and EOM are normal. Pupils are equal, round, and reactive to light.  Neck: Normal range of motion. Neck supple.  Cardiovascular: Normal rate, regular rhythm and normal heart sounds.   Pulmonary/Chest: Effort normal and breath sounds normal. No respiratory distress. She has no wheezes.  Lymphadenopathy:    She has no cervical adenopathy.          Assessment & Plan:

## 2013-07-21 NOTE — Progress Notes (Signed)
Pre visit review using our clinic review tool, if applicable. No additional management support is needed unless otherwise documented below in the visit note. 

## 2013-07-21 NOTE — Assessment & Plan Note (Signed)
Pt's sxs and PE consistent w/ infxn.  Start abx.  Cough meds prn.  Reviewed supportive care and red flags that should prompt return.  Pt expressed understanding and is in agreement w/ plan.  

## 2013-07-28 ENCOUNTER — Encounter: Payer: Self-pay | Admitting: Family Medicine

## 2013-07-29 ENCOUNTER — Other Ambulatory Visit: Payer: Self-pay

## 2013-07-29 MED ORDER — CEFUROXIME AXETIL 500 MG PO TABS: 500.0000 mg | ORAL_TABLET | Freq: Two times a day (BID) | ORAL | Status: DC

## 2013-08-26 ENCOUNTER — Telehealth: Payer: Self-pay | Admitting: *Deleted

## 2013-08-26 NOTE — Telephone Encounter (Signed)
Caller name:  Wynelle Relation to pt:  self Call back number: [phone]  Pharmacy:  Reason for call:   Pt called and is having problems with her insurance.  She has [employer] and they are saying we are out of network.    Please advise.  bw

## 2013-09-07 ENCOUNTER — Other Ambulatory Visit: Payer: Self-pay | Admitting: Family Medicine

## 2013-09-08 NOTE — Telephone Encounter (Signed)
Rx was denied because the rx was filled on (07-07-13).//AB/CMA

## 2013-09-14 ENCOUNTER — Encounter: Payer: Self-pay | Admitting: Family Medicine

## 2013-11-11 ENCOUNTER — Telehealth: Payer: Self-pay | Admitting: Family Medicine

## 2013-11-11 NOTE — Telephone Encounter (Signed)
error 

## 2013-12-09 LAB — HM MAMMOGRAPHY

## 2013-12-12 ENCOUNTER — Encounter: Payer: Self-pay | Admitting: General Practice

## 2014-01-08 ENCOUNTER — Encounter: Payer: Medicare Other | Admitting: Family Medicine

## 2014-01-28 ENCOUNTER — Ambulatory Visit (INDEPENDENT_AMBULATORY_CARE_PROVIDER_SITE_OTHER): Payer: Medicare HMO | Admitting: Family Medicine

## 2014-01-28 ENCOUNTER — Encounter: Payer: Self-pay | Admitting: Family Medicine

## 2014-01-28 VITALS — BP 128/74 | HR 60 | Temp 97.9°F | Resp 16 | Ht 60.0 in | Wt 122.5 lb

## 2014-01-28 DIAGNOSIS — I10 Essential (primary) hypertension: Secondary | ICD-10-CM

## 2014-01-28 DIAGNOSIS — M810 Age-related osteoporosis without current pathological fracture: Secondary | ICD-10-CM

## 2014-01-28 DIAGNOSIS — Z0000 Encounter for general adult medical examination without abnormal findings: Secondary | ICD-10-CM

## 2014-01-28 DIAGNOSIS — E038 Other specified hypothyroidism: Secondary | ICD-10-CM

## 2014-01-28 DIAGNOSIS — E785 Hyperlipidemia, unspecified: Secondary | ICD-10-CM

## 2014-01-28 LAB — BASIC METABOLIC PANEL
BUN: 17 mg/dL (ref 6–23)
CALCIUM: 9.3 mg/dL (ref 8.4–10.5)
CO2: 29 mEq/L (ref 19–32)
Chloride: 101 mEq/L (ref 96–112)
Creatinine, Ser: 0.8 mg/dL (ref 0.4–1.2)
GFR: 73.59 mL/min (ref >60.00)
GLUCOSE: 84 mg/dL (ref 70–99)
Potassium: 4.5 mEq/L (ref 3.5–5.1)
Sodium: 139 mEq/L (ref 135–145)

## 2014-01-28 LAB — HEPATIC FUNCTION PANEL
ALBUMIN: 4.3 g/dL (ref 3.5–5.2)
ALT: 21 U/L (ref 0–35)
AST: 24 U/L (ref 0–37)
Alkaline Phosphatase: 56 U/L (ref 39–117)
BILIRUBIN DIRECT: 0 mg/dL (ref 0.0–0.3)
TOTAL PROTEIN: 7.1 g/dL (ref 6.0–8.3)
Total Bilirubin: 0.7 mg/dL (ref 0.2–1.2)

## 2014-01-28 LAB — VITAMIN D 25 HYDROXY (VIT D DEFICIENCY, FRACTURES): VITD: 36.59 ng/mL (ref 30.00–100.00)

## 2014-01-28 LAB — CBC WITH DIFFERENTIAL/PLATELET
Basophils Absolute: 0 10*3/uL (ref 0.0–0.1)
Basophils Relative: 0.5 % (ref 0.0–3.0)
EOS ABS: 0.1 10*3/uL (ref 0.0–0.7)
Eosinophils Relative: 1.4 % (ref 0.0–5.0)
HCT: 42.9 % (ref 36.0–46.0)
HEMOGLOBIN: 14.4 g/dL (ref 12.0–15.0)
LYMPHS PCT: 22.2 % (ref 12.0–46.0)
Lymphs Abs: 1.9 10*3/uL (ref 0.7–4.0)
MCHC: 33.5 g/dL (ref 30.0–36.0)
MCV: 88.5 fl (ref 78.0–100.0)
MONOS PCT: 4.9 % (ref 3.0–12.0)
Monocytes Absolute: 0.4 10*3/uL (ref 0.1–1.0)
NEUTROS ABS: 6.1 10*3/uL (ref 1.4–7.7)
NEUTROS PCT: 71 % (ref 43.0–77.0)
Platelets: 324 10*3/uL (ref 150.0–400.0)
RBC: 4.85 Mil/uL (ref 3.87–5.11)
RDW: 13.3 % (ref 11.5–15.5)
WBC: 8.6 10*3/uL (ref 4.0–10.5)

## 2014-01-28 LAB — LIPID PANEL
CHOL/HDL RATIO: 3
Cholesterol: 184 mg/dL (ref 0–200)
HDL: 58.3 mg/dL (ref >39.00)
LDL Cholesterol: 111 mg/dL — ABNORMAL HIGH (ref 0–99)
NONHDL: 125.7
Triglycerides: 73 mg/dL (ref 0.0–149.0)
VLDL: 14.6 mg/dL (ref 0.0–40.0)

## 2014-01-28 LAB — TSH: TSH: 1.11 u[IU]/mL (ref 0.35–4.50)

## 2014-01-28 NOTE — Assessment & Plan Note (Signed)
Chronic problem.  Tolerating statin w/o difficulty.  Check labs.  Adjust meds prn  

## 2014-01-28 NOTE — Assessment & Plan Note (Signed)
Chronic problem.  Had Reclast in March.  Due for DEXA- order entered.  Check Vit D.  Replete prn.

## 2014-01-28 NOTE — Progress Notes (Signed)
   Subjective:    Patient ID: [name], female    DOB: [birth year], 68 y.o.   MRN: [id number]  HPI Here today for CPE.  Risk Factors: HTN- chronic problem, on Amlodipine Hyperlipidemia- chronic problem, on Lipitor Osteoporosis- chronic problem, had Reclast 05/06/13 Hypothyroid- chronic problem, on Synthroid Physical Activity: exercising regularly, 4-5 days/week Fall Risk: low Depression: denies current sxs Hearing: normal to conversational tones, whispered voice at 6 ft ADL's: independent Cognitive: normal linear thought process, memory and attention intact Home Safety: safe at home Height, Weight, BMI, Visual Acuity: see vitals, vision corrected to 20/20 w/ glasses Counseling: UTD on colonoscopy, Mammo, due for DEXA ([facility]), no need for pap Labs Ordered: See A&P Care Plan: See A&P    Review of Systems Patient reports no vision/ hearing changes, adenopathy,fever, weight change,  persistant/recurrent hoarseness , swallowing issues, chest pain, palpitations, edema, persistant/recurrent cough, hemoptysis, dyspnea (rest/exertional/paroxysmal nocturnal), gastrointestinal bleeding (melena, rectal bleeding), abdominal pain, significant heartburn, bowel changes, GU symptoms (dysuria, hematuria, incontinence), Gyn symptoms (abnormal  bleeding, pain),  syncope, focal weakness, memory loss, numbness & tingling, skin/hair/nail changes, abnormal bruising or bleeding, anxiety, or depression.     Objective:   Physical Exam General Appearance:    Alert, cooperative, no distress, appears stated age  Head:    Normocephalic, without obvious abnormality, atraumatic  Eyes:    PERRL, conjunctiva/corneas clear, EOM's intact, fundi    benign, both eyes  Ears:    Normal TM's and external ear canals, both ears  Nose:   Nares normal, septum midline, mucosa normal, no drainage    or sinus tenderness  Throat:   Lips, mucosa, and tongue normal; teeth and gums normal  Neck:   Supple, symmetrical, trachea  midline, no adenopathy;    Thyroid: no enlargement/tenderness/nodules  Back:     Symmetric, no curvature, ROM normal, no CVA tenderness  Lungs:     Clear to auscultation bilaterally, respirations unlabored  Chest Wall:    No tenderness or deformity   Heart:    Regular rate and rhythm, S1 and S2 normal, no murmur, rub   or gallop  Breast Exam:    Deferred to mammo  Abdomen:     Soft, non-tender, bowel sounds active all four quadrants,    no masses, no organomegaly  Genitalia:    Deferred  Rectal:    Extremities:   Extremities normal, atraumatic, no cyanosis or edema  Pulses:   2+ and symmetric all extremities  Skin:   Skin color, texture, turgor normal, no rashes or lesions  Lymph nodes:   Cervical, supraclavicular, and axillary nodes normal  Neurologic:   CNII-XII intact, normal strength, sensation and reflexes    throughout          Assessment & Plan:

## 2014-01-28 NOTE — Assessment & Plan Note (Signed)
Chronic problem.  Excellent control.  Asymptomatic.  Check labs.  No anticipated med changes. 

## 2014-01-28 NOTE — Assessment & Plan Note (Signed)
Chronic problem.  Currently asymptomatic.  Check labs.  Adjust meds prn  

## 2014-01-28 NOTE — Patient Instructions (Signed)
Follow up in 6 months to recheck BP and cholesterol We'll notify you of your lab results and make any changes if needed Keep up the good work on healthy diet and regular exercise- you look great! The referral for your bone density has been placed Call with any questions or concerns Happy Holidays!!!

## 2014-01-28 NOTE — Progress Notes (Signed)
Pre visit review using our clinic review tool, if applicable. No additional management support is needed unless otherwise documented below in the visit note. 

## 2014-01-28 NOTE — Assessment & Plan Note (Signed)
Pt's PE WNL.  UTD on colonoscopy, mammo.  Due for DEXA- order entered.  Written screening schedule updated and given to pt.  Check labs.  Anticipatory guidance provided.

## 2014-02-10 LAB — HM DEXA SCAN

## 2014-02-19 ENCOUNTER — Telehealth: Payer: Self-pay | Admitting: Family Medicine

## 2014-02-19 NOTE — Telephone Encounter (Signed)
ok 

## 2014-02-19 NOTE — Telephone Encounter (Signed)
Patient is requesting to switch from Dr. Birdie Riddle to Dr. Larose Kells because her [facility] plan recognizes family practice providers as specialist rather than primary care and she is having to pay specialist copays, is this ok

## 2014-02-21 NOTE — Telephone Encounter (Signed)
Ok to switch, but PLEASE have someone in credentialing/insurance look into this b/c she is a very nice pt, we have a good rapport, and to lose her over something like this is RIDICULOUS!!!  Family medicine is clearly NOT a subspecialty and her copay should reflect that I am a PCP

## 2014-02-23 ENCOUNTER — Other Ambulatory Visit: Payer: Self-pay | Admitting: Family Medicine

## 2014-02-23 NOTE — Telephone Encounter (Signed)
Med filled.  

## 2014-02-25 ENCOUNTER — Encounter: Payer: Self-pay | Admitting: Family Medicine

## 2014-02-25 ENCOUNTER — Other Ambulatory Visit: Payer: Self-pay | Admitting: Dermatology

## 2014-02-26 MED ORDER — ATORVASTATIN CALCIUM 20 MG PO TABS: 20.0000 mg | ORAL_TABLET | Freq: Every day | ORAL | Status: DC

## 2014-02-26 NOTE — Telephone Encounter (Signed)
Med filled.  

## 2014-03-03 ENCOUNTER — Telehealth: Payer: Self-pay | Admitting: Family Medicine

## 2014-03-03 NOTE — Telephone Encounter (Signed)
Per, [place].   [place] for patient to transfer to Dr. Larose Kells. Left message for patient to return my call.

## 2014-03-20 ENCOUNTER — Encounter: Payer: Self-pay | Admitting: General Practice

## 2014-03-30 ENCOUNTER — Encounter: Payer: Self-pay | Admitting: Family Medicine

## 2014-03-30 NOTE — Telephone Encounter (Signed)
Please advise.//AB/CMA 

## 2014-06-12 ENCOUNTER — Encounter: Payer: Self-pay | Admitting: Family Medicine

## 2014-06-12 MED ORDER — ATORVASTATIN CALCIUM 20 MG PO TABS: 20.0000 mg | ORAL_TABLET | Freq: Every day | ORAL | Status: DC

## 2014-06-12 MED ORDER — LEVOTHYROXINE SODIUM 50 MCG PO TABS: 50.0000 ug | ORAL_TABLET | Freq: Every day | ORAL | Status: DC

## 2014-06-12 MED ORDER — AMLODIPINE BESYLATE 5 MG PO TABS: 5.0000 mg | ORAL_TABLET | Freq: Every day | ORAL | Status: DC

## 2014-06-12 NOTE — Telephone Encounter (Signed)
Meds filled

## 2014-07-28 ENCOUNTER — Telehealth: Payer: Self-pay | Admitting: *Deleted

## 2014-07-28 ENCOUNTER — Encounter: Payer: Self-pay | Admitting: *Deleted

## 2014-07-28 NOTE — Telephone Encounter (Signed)
Unable to reach patient at time of Pre-Visit Call.  Left message with husband to confirm appointment.

## 2014-07-28 NOTE — Telephone Encounter (Signed)
Pre-Visit Call completed with patient and chart updated.   Pre-Visit Info documented in Specialty Comments under [employer].    

## 2014-07-28 NOTE — Addendum Note (Signed)
Addended by: Leticia Penna A on: 07/28/2014 02:26 PM   Modules accepted: Medications

## 2014-07-29 ENCOUNTER — Encounter: Payer: Self-pay | Admitting: Internal Medicine

## 2014-07-29 ENCOUNTER — Ambulatory Visit: Payer: Medicare HMO | Admitting: Family Medicine

## 2014-07-29 ENCOUNTER — Ambulatory Visit (INDEPENDENT_AMBULATORY_CARE_PROVIDER_SITE_OTHER): Payer: Medicare HMO | Admitting: Internal Medicine

## 2014-07-29 VITALS — BP 124/76 | HR 69 | Temp 97.7°F | Ht 60.0 in | Wt 123.5 lb

## 2014-07-29 DIAGNOSIS — E039 Hypothyroidism, unspecified: Secondary | ICD-10-CM | POA: Diagnosis not present

## 2014-07-29 DIAGNOSIS — E785 Hyperlipidemia, unspecified: Secondary | ICD-10-CM | POA: Diagnosis not present

## 2014-07-29 DIAGNOSIS — I10 Essential (primary) hypertension: Secondary | ICD-10-CM

## 2014-07-29 DIAGNOSIS — C499 Malignant neoplasm of connective and soft tissue, unspecified: Secondary | ICD-10-CM | POA: Diagnosis not present

## 2014-07-29 LAB — TSH: TSH: 0.94 u[IU]/mL (ref 0.35–4.50)

## 2014-07-29 MED ORDER — ATORVASTATIN CALCIUM 20 MG PO TABS: 20.0000 mg | ORAL_TABLET | Freq: Every day | ORAL | Status: DC

## 2014-07-29 MED ORDER — AMLODIPINE BESYLATE 5 MG PO TABS: 5.0000 mg | ORAL_TABLET | Freq: Every day | ORAL | Status: DC

## 2014-07-29 NOTE — Patient Instructions (Signed)
Go to the lab.

## 2014-07-29 NOTE — Progress Notes (Signed)
Pre visit review using our clinic review tool, if applicable. No additional management support is needed unless otherwise documented below in the visit note. 

## 2014-07-29 NOTE — Progress Notes (Signed)
Subjective:    Patient ID: [name], female    DOB: 07-31-1945, 69 y.o.   MRN: [id number]  DOS:  07/29/2014 Type of visit - description : transferring from Dr Birdie Riddle, chart reviewed Interval history: Hypertension, good compliance w/ medication, ambulatory BPs normal, usually even better than today's. High cholesterol, good compliance with Lipitor. No apparent side effects Hypothyroidism, good compliance with meds, due for a TSH. Experiencing some allergies, mild, denies need for any treatment.   Review of Systems Denies chest pain, difficulty breathing. No nausea, vomiting, diarrhea or blood in the stools. No cough, chest congestion or sputum production. Occasionally has lower extremity edema at the ankles.  Past Medical History  Diagnosis Date  . Hyperlipidemia   . Hypertension   . Osteoporosis   . Hypothyroidism   . SCC (spinal cord compression)   . Liposarcoma 1972    r leg    Past Surgical History  Procedure Laterality Date  . Abdominal hysterectomy      no oophorectomy  . Breast lumpectomy      x2 left breast  . Tonsillectomy    . Squamous cell carcinoma excision      RLE  . R leg liposarcoma  1973    History   Social History  . Marital Status: Married    Spouse Name: N/A  . Number of Children: 2  . Years of Education: N/A   Occupational History  . retired -- Art therapist, class Research scientist (medical)     Social History Main Topics  . Smoking status: Never Smoker   . Smokeless tobacco: Not on file  . Alcohol Use: No  . Drug Use: No  . Sexual Activity: Yes    Birth Control/ Protection: Post-menopausal   Other Topics Concern  . Not on file   Social History Narrative   Lost a son when he was 19 y/o        Medication List       This list is accurate as of: 07/29/14 11:59 PM.  Always use your most recent med list.               amLODipine 5 MG tablet  Commonly known as:  NORVASC  Take 1 tablet (5 mg total) by mouth daily.     aspirin  81 MG EC tablet  Take 81 mg by mouth daily.     atorvastatin 20 MG tablet  Commonly known as:  LIPITOR  Take 1 tablet (20 mg total) by mouth daily.     calcium citrate-vitamin D 315-200 MG-UNIT per tablet  Commonly known as:  CITRACAL+D  Take 1 tablet by mouth. 600 mg bid     levothyroxine 50 MCG tablet  Commonly known as:  SYNTHROID, LEVOTHROID  Take 1 tablet (50 mcg total) by mouth daily.     multivitamin tablet  Take 1 tablet by mouth daily.     RECLAST 5 MG/100ML Soln injection  Generic drug:  zoledronic acid  Inject 5 mg into the vein once.     RESTASIS 0.05 % ophthalmic emulsion  Generic drug:  cycloSPORINE  1 drop 2 (two) times daily.     VITAMIN D PO  Take 1,000 Int'l Units by mouth daily.     vitamin E 400 UNIT capsule  Take 400 Units by mouth daily.           Objective:   Physical Exam BP 124/76 mmHg  Pulse 69  Temp(Src) 97.7 F (36.5 C) (Oral)  Ht  5' (1.524 m)  Wt 123 lb 8 oz (56.019 kg)  BMI 24.12 kg/m2  SpO2 98% General:   Well developed, well nourished . NAD.  HEENT:  Normocephalic . Face symmetric, atraumatic Lungs:  CTA B Normal respiratory effort, no intercostal retractions, no accessory muscle use. Heart: RRR,  no murmur.  No pretibial edema bilaterally  Skin: Not pale. Not jaundice Neurologic:  alert & oriented X3.  Speech normal, gait appropriate for age and unassisted Psych--  Cognition and judgment appear intact.  Cooperative with normal attention span and concentration.  Behavior appropriate. No anxious or depressed appearing.        Assessment & Plan:

## 2014-07-29 NOTE — Assessment & Plan Note (Signed)
Symptoms under excellent control, refill amlodipine, recent BMP normal

## 2014-07-29 NOTE — Assessment & Plan Note (Signed)
Good compliance with Lipitor, recent FLP, AST ALT very good. Refill medicines.  Came back fasting in few months for a physical

## 2014-07-29 NOTE — Assessment & Plan Note (Addendum)
Refill meds, check a TSH

## 2014-08-31 ENCOUNTER — Other Ambulatory Visit: Payer: Self-pay

## 2014-12-14 ENCOUNTER — Other Ambulatory Visit: Payer: Self-pay | Admitting: Family Medicine

## 2014-12-14 ENCOUNTER — Other Ambulatory Visit: Payer: Self-pay

## 2014-12-14 ENCOUNTER — Encounter: Payer: Self-pay | Admitting: Internal Medicine

## 2014-12-14 LAB — HM MAMMOGRAPHY

## 2014-12-14 MED ORDER — LEVOTHYROXINE SODIUM 50 MCG PO TABS: 50.0000 ug | ORAL_TABLET | Freq: Every day | ORAL | Status: DC

## 2014-12-25 ENCOUNTER — Encounter: Payer: Self-pay | Admitting: Internal Medicine

## 2014-12-29 ENCOUNTER — Encounter: Payer: Self-pay | Admitting: Internal Medicine

## 2015-01-04 ENCOUNTER — Encounter: Payer: Self-pay | Admitting: Internal Medicine

## 2015-01-04 ENCOUNTER — Other Ambulatory Visit: Payer: Self-pay

## 2015-01-04 MED ORDER — AMLODIPINE BESYLATE 5 MG PO TABS: 5.0000 mg | ORAL_TABLET | Freq: Every day | ORAL | Status: DC

## 2015-01-04 MED ORDER — ATORVASTATIN CALCIUM 20 MG PO TABS: 20.0000 mg | ORAL_TABLET | Freq: Every day | ORAL | Status: DC

## 2015-02-08 ENCOUNTER — Telehealth: Payer: Self-pay | Admitting: Behavioral Health

## 2015-02-08 ENCOUNTER — Encounter: Payer: Self-pay | Admitting: Behavioral Health

## 2015-02-08 NOTE — Addendum Note (Signed)
Addended by: Eduard Roux E on: 02/08/2015 03:59 PM   Modules accepted: Medications

## 2015-02-08 NOTE — Telephone Encounter (Signed)
Unable to reach patient at time of Pre-Visit Call.  Left message for patient to return call when available.    

## 2015-02-08 NOTE — Telephone Encounter (Signed)
Pre-Visit Call completed with patient and chart updated.   Pre-Visit Info documented in Specialty Comments under [employer].    

## 2015-02-09 ENCOUNTER — Encounter: Payer: Self-pay | Admitting: Internal Medicine

## 2015-02-09 ENCOUNTER — Ambulatory Visit (INDEPENDENT_AMBULATORY_CARE_PROVIDER_SITE_OTHER): Payer: Medicare HMO | Admitting: Internal Medicine

## 2015-02-09 VITALS — BP 112/74 | HR 66 | Temp 97.6°F | Ht 60.0 in | Wt 124.2 lb

## 2015-02-09 DIAGNOSIS — E785 Hyperlipidemia, unspecified: Secondary | ICD-10-CM | POA: Diagnosis not present

## 2015-02-09 DIAGNOSIS — Z1159 Encounter for screening for other viral diseases: Secondary | ICD-10-CM

## 2015-02-09 DIAGNOSIS — E039 Hypothyroidism, unspecified: Secondary | ICD-10-CM | POA: Diagnosis not present

## 2015-02-09 DIAGNOSIS — Z0000 Encounter for general adult medical examination without abnormal findings: Secondary | ICD-10-CM | POA: Diagnosis not present

## 2015-02-09 DIAGNOSIS — Z09 Encounter for follow-up examination after completed treatment for conditions other than malignant neoplasm: Secondary | ICD-10-CM

## 2015-02-09 DIAGNOSIS — M810 Age-related osteoporosis without current pathological fracture: Secondary | ICD-10-CM

## 2015-02-09 DIAGNOSIS — Z23 Encounter for immunization: Secondary | ICD-10-CM

## 2015-02-09 DIAGNOSIS — I10 Essential (primary) hypertension: Secondary | ICD-10-CM | POA: Diagnosis not present

## 2015-02-09 LAB — COMPREHENSIVE METABOLIC PANEL
ALT: 23 U/L (ref 0–35)
AST: 23 U/L (ref 0–37)
Albumin: 4.4 g/dL (ref 3.5–5.2)
Alkaline Phosphatase: 55 U/L (ref 39–117)
BILIRUBIN TOTAL: 0.6 mg/dL (ref 0.2–1.2)
BUN: 13 mg/dL (ref 6–23)
CO2: 29 meq/L (ref 19–32)
Calcium: 9.3 mg/dL (ref 8.4–10.5)
Chloride: 102 mEq/L (ref 96–112)
Creatinine, Ser: 0.74 mg/dL (ref 0.40–1.20)
GFR: 82.6 mL/min (ref >60.00)
Glucose, Bld: 90 mg/dL (ref 70–99)
Potassium: 3.6 mEq/L (ref 3.5–5.1)
Sodium: 140 mEq/L (ref 135–145)
Total Protein: 7.3 g/dL (ref 6.0–8.3)

## 2015-02-09 LAB — LIPID PANEL
CHOL/HDL RATIO: 4
Cholesterol: 193 mg/dL (ref 0–200)
HDL: 54.7 mg/dL (ref >39.00)
LDL Cholesterol: 122 mg/dL — ABNORMAL HIGH (ref 0–99)
NonHDL: 138.27
Triglycerides: 82 mg/dL (ref 0.0–149.0)
VLDL: 16.4 mg/dL (ref 0.0–40.0)

## 2015-02-09 LAB — TSH: TSH: 0.83 u[IU]/mL (ref 0.35–4.50)

## 2015-02-09 NOTE — Assessment & Plan Note (Signed)
Td 2012 Pneumonia shot 2012, Prevnar 02-2015  Zostavax 2008 Had a flu shot [facility]: 2011 colonoscopy normal Dr. Oletta Lamas Female care:  S/p hysterectomy 1976 (benign reason),  Several PAPs after wnl: no further screening ( is asx) MMG 12-2014 normal,SBE wnl  Has a very healthy lifestyle

## 2015-02-09 NOTE — Patient Instructions (Signed)
Get your blood work before you leave     Check the  blood pressure  monthly   Be sure your blood pressure is between 110/65 and  145/85.  if it is consistently higher or lower, let me know    Please schedule a office visit with Casey Burkitt for Medicare wellness exam   Next visit  for a 10 checkup in 6-8 months   (15 minutes) Please schedule an appointment at the front desk No  Fasting

## 2015-02-09 NOTE — Assessment & Plan Note (Signed)
HTN: Well-controlled, check a CMP Hyperlipidemia: Continue Lipitor, check FLP Hypothyroidism: check a TSH Osteoporosis: Chart reviewed, see assessment , s/p reclast 3, last 05-2013. We agreed to recheck a DEXA byDecember 2017, otherwise continue calcium, vitamin D and physical activity Labs: CMP, FLP, TSH, hep C. RTC 6-8 months

## 2015-02-09 NOTE — Progress Notes (Signed)
Subjective:    Patient ID: [name], female    DOB: [birth year], 69 y.o.   MRN: [id number]  DOS:  02/09/2015 Type of visit - description : Here for a physical exam Interval history: We also discussed all chronic medical problems: Good compliance with Lipitor, Synthroid, calcium and vitamin D. Osteoporosis: Reviews bone density test and treatments reviewed.  Wt Readings from Last 3 Encounters:  02/09/15 124 lb 4 oz (56.359 kg)  07/29/14 123 lb 8 oz (56.019 kg)  01/28/14 122 lb 8 oz (55.566 kg)     Review of Systems  Constitutional: No fever. No chills. Gained  a couple of pounds since the last physical exam, she remains active and eats healthy  HEENT: No dental problems, no ear discharge, no facial swelling, no voice changes. No eye discharge, no eye  redness , no  intolerance to light   Respiratory: No wheezing , no  difficulty breathing. No cough , no mucus production  Cardiovascular: No CP, no leg swelling , no  Palpitations  GI: no nausea, no vomiting, no diarrhea , no  abdominal pain.  No blood in the stools. No dysphagia, no odynophagia    Endocrine: No polyphagia, no polyuria , no polydipsia  GU: No dysuria, gross hematuria, difficulty urinating. No urinary urgency, no frequency.  Musculoskeletal: No joint swellings or unusual aches or pains  Skin: No change in the color of the skin, palor , no  Rash  Allergic, immunologic: No environmental allergies , no  food allergies  Neurological: No dizziness no  syncope. No headaches. No diplopia, no slurred, no slurred speech, no motor deficits, no facial  Numbness  Hematological: No enlarged lymph nodes, no easy bruising , no unusual bleedings  Psychiatry: No suicidal ideas, no hallucinations, no beavior problems, no confusion.  No unusual/severe anxiety, no depression  Past Medical History  Diagnosis Date  . Hyperlipidemia   . Hypertension   . Osteoporosis   . Hypothyroidism   . Liposarcoma ([place]) 1972    r leg   . SCC (squamous cell carcinoma)     Past Surgical History  Procedure Laterality Date  . Abdominal hysterectomy      no oophorectomy  . Breast lumpectomy      x2 left breast  . Tonsillectomy    . Squamous cell carcinoma excision      RLE  . R leg liposarcoma  1973    Social History   Social History  . Marital Status: Married    Spouse Name: N/A  . Number of Children: 2  . Years of Education: N/A   Occupational History  . retired -- Art therapist, class Research scientist (medical)     Social History Main Topics  . Smoking status: Never Smoker   . Smokeless tobacco: Not on file  . Alcohol Use: No  . Drug Use: No  . Sexual Activity: Yes    Birth Control/ Protection: Post-menopausal   Other Topics Concern  . Not on file   Social History Narrative   Lost a son when he was 19 y/o   Lives w/ husband      Family History  Problem Relation Age of Onset  . Heart disease Mother     congenital heart defect  . Cancer Father     kidney cancer  . Cancer Maternal Aunt     breast cancer  . Cancer Paternal Aunt     breast cancer  . Cancer Brother     Brain Cancer  .  Colon cancer Neg Hx   . Diabetes Neg Hx        Medication List       This list is accurate as of: 02/09/15  3:17 PM.  Always use your most recent med list.               amLODipine 5 MG tablet  Commonly known as:  NORVASC  Take 1 tablet (5 mg total) by mouth daily.     aspirin 81 MG EC tablet  Take 81 mg by mouth daily.     atorvastatin 20 MG tablet  Commonly known as:  LIPITOR  Take 1 tablet (20 mg total) by mouth daily.     calcium citrate-vitamin D 315-200 MG-UNIT tablet  Commonly known as:  CITRACAL+D  Take 1 tablet by mouth. 600 mg bid     ibuprofen 200 MG tablet  Commonly known as:  ADVIL,MOTRIN  Take 200 mg by mouth at bedtime.     levothyroxine 50 MCG tablet  Commonly known as:  SYNTHROID, LEVOTHROID  Take 1 tablet (50 mcg total) by mouth daily before breakfast.     multivitamin  tablet  Take 1 tablet by mouth daily.     RECLAST 5 MG/100ML Soln injection  Generic drug:  zoledronic acid  Inject 5 mg into the vein once.     RESTASIS 0.05 % ophthalmic emulsion  Generic drug:  cycloSPORINE  1 drop 2 (two) times daily.     VITAMIN D PO  Take 1,000 Int'l Units by mouth daily.     vitamin E 400 UNIT capsule  Take 400 Units by mouth daily.           Objective:   Physical Exam BP 112/74 mmHg  Pulse 66  Temp(Src) 97.6 F (36.4 C) (Oral)  Ht 5' (1.524 m)  Wt 124 lb 4 oz (56.359 kg)  BMI 24.27 kg/m2  SpO2 98% General:   Well developed, well nourished . NAD.  Neck:  Full range of motion. Supple. No  thyromegaly  HEENT:  Normocephalic . Face symmetric, atraumatic Lungs:  CTA B Normal respiratory effort, no intercostal retractions, no accessory muscle use. Heart: RRR,  no murmur.  No pretibial edema bilaterally  Abdomen:  Not distended, soft, non-tender. No rebound or rigidity.  Skin: Exposed areas without rash. Not pale. Not jaundice Neurologic:  alert & oriented X3.  Speech normal, gait appropriate for age and unassisted Strength symmetric and appropriate for age.  Psych: Cognition and judgment appear intact.  Cooperative with normal attention span and concentration.  Behavior appropriate. No anxious or depressed appearing.    Assessment & Plan:   Assessment> HTN Hyperlipidemia Hypothyroidism Osteoporosis --T score 2013: -2.6,  T score 02-2014  -2.5 after  Reclast x 3. Last reclast 05-2013. --Normal Vit D 2014 SCC, skin cancer, right lower extremity, sees derm q 6 months Liposarcoma right leg 1972  PLAN: HTN: Well-controlled, check a CMP Hyperlipidemia: Continue Lipitor, check FLP Hypothyroidism: check a TSH Osteoporosis: Chart reviewed, see assessment , s/p reclast 3, last 05-2013. We agreed to recheck a DEXA byDecember 2017, otherwise continue calcium, vitamin D and physical activity Labs: CMP, FLP, TSH, hep C. RTC 6-8 months

## 2015-02-09 NOTE — Progress Notes (Signed)
Pre visit review using our clinic review tool, if applicable. No additional management support is needed unless otherwise documented below in the visit note. 

## 2015-02-10 LAB — HEPATITIS C ANTIBODY: HCV Ab: NEGATIVE

## 2015-02-18 ENCOUNTER — Encounter: Payer: Self-pay | Admitting: Internal Medicine

## 2015-03-15 ENCOUNTER — Other Ambulatory Visit: Payer: Self-pay | Admitting: Internal Medicine

## 2015-03-15 ENCOUNTER — Ambulatory Visit: Payer: Medicare HMO

## 2015-03-26 ENCOUNTER — Ambulatory Visit: Payer: Medicare HMO

## 2015-03-30 ENCOUNTER — Telehealth: Payer: Self-pay | Admitting: *Deleted

## 2015-03-30 ENCOUNTER — Other Ambulatory Visit: Payer: Self-pay | Admitting: Internal Medicine

## 2015-03-30 MED ORDER — AMLODIPINE BESYLATE 5 MG PO TABS: 5.0000 mg | ORAL_TABLET | Freq: Every day | ORAL | Status: DC

## 2015-03-30 MED ORDER — ATORVASTATIN CALCIUM 20 MG PO TABS: 20.0000 mg | ORAL_TABLET | Freq: Every day | ORAL | Status: DC

## 2015-03-30 NOTE — Telephone Encounter (Signed)
Lipitor and Amlodipine sent to requested pharmacy.

## 2015-03-30 NOTE — Telephone Encounter (Signed)
Appt for 03/30/2014 @ 8:45am confirmed w/ pt.

## 2015-03-31 ENCOUNTER — Ambulatory Visit (INDEPENDENT_AMBULATORY_CARE_PROVIDER_SITE_OTHER): Payer: Medicare HMO

## 2015-03-31 VITALS — BP 146/68 | HR 71 | Ht 60.0 in | Wt 127.2 lb

## 2015-03-31 DIAGNOSIS — Z0000 Encounter for general adult medical examination without abnormal findings: Secondary | ICD-10-CM | POA: Diagnosis not present

## 2015-03-31 NOTE — Progress Notes (Addendum)
Subjective:   [name] is a 70 y.o. female who presents for Medicare Annual (Subsequent) preventive examination.  Review of Systems: No ROS Cardiac Risk Factors include: advanced age (>24men, >65 women);hypertension Sleep patterns:  Sleeps at least 8 hours/mostly sleeps through the night; occasional hot flashes  Home Safety/Smoke Alarms:  Feels safe at home.  Lives in one level home with husband.  Smoke alarms and carbon monoxide detectors present.   Firearm Safety: Kept in safe place.   Seat Belt Safety/Bike Helmet:  Always wears seat belt.    Counseling:   Eye Exam- 12/2014 Dental- Goes every 6 months.   Female:  Pap-Hysterectomy     Mammo-UTD      Dexa scan-recheck a DEXA byDecember 2017   CCS--01/03/10--normal--Dr.Edwards Immunizations-UTD  Objective:     Vitals: BP 146/68 mmHg  Pulse 71  Ht 5' (1.524 m)  Wt 127 lb 3.2 oz (57.698 kg)  BMI 24.84 kg/m2  SpO2 97%  Tobacco History  Smoking status  . Never Smoker   Smokeless tobacco  . Not on file     Counseling given: Not Answered   Past Medical History  Diagnosis Date  . Hyperlipidemia   . Hypertension   . Osteoporosis   . Hypothyroidism   . Liposarcoma ([place]) 1972    r leg  . SCC (squamous cell carcinoma)    Past Surgical History  Procedure Laterality Date  . Abdominal hysterectomy      no oophorectomy  . Breast lumpectomy      x2 left breast  . Tonsillectomy    . Squamous cell carcinoma excision      RLE  . R leg liposarcoma  1973   Family History  Problem Relation Age of Onset  . Heart disease Mother     congenital heart defect  . Cancer Father     kidney cancer  . Cancer Maternal Aunt     breast cancer  . Cancer Paternal Aunt     breast cancer  . Cancer Brother     Brain Cancer  . Colon cancer Neg Hx   . Diabetes Neg Hx    History  Sexual Activity  . Sexual Activity: Yes  . Birth Control/ Protection: Post-menopausal    Outpatient Encounter Prescriptions as of 03/31/2015    Medication Sig  . amLODipine (NORVASC) 5 MG tablet Take 1 tablet (5 mg total) by mouth daily.  Marland Kitchen aspirin 81 MG EC tablet Take 81 mg by mouth daily.    Marland Kitchen atorvastatin (LIPITOR) 20 MG tablet Take 1 tablet (20 mg total) by mouth daily.  . calcium citrate-vitamin D (CITRACAL+D) 315-200 MG-UNIT per tablet Take 1 tablet by mouth. 600 mg bid  . Cholecalciferol (VITAMIN D PO) Take 1,000 Int'l Units by mouth daily.   . cycloSPORINE (RESTASIS) 0.05 % ophthalmic emulsion 1 drop 2 (two) times daily.    Marland Kitchen ibuprofen (ADVIL,MOTRIN) 200 MG tablet Take 200 mg by mouth at bedtime.  Marland Kitchen levothyroxine (SYNTHROID, LEVOTHROID) 50 MCG tablet Take 1 tablet (50 mcg total) by mouth daily before breakfast.  . Multiple Vitamin (MULTIVITAMIN) tablet Take 1 tablet by mouth daily.    . Omega-3 Fatty Acids (FISH OIL) 1200 MG CAPS Take 1 capsule by mouth 2 (two) times daily.  . vitamin E 400 UNIT capsule Take 400 Units by mouth daily.  . zoledronic acid (RECLAST) 5 MG/100ML SOLN Inject 5 mg into the vein once.     No facility-administered encounter medications on file as of 03/31/2015.  Activities of Daily Living In your present state of health, do you have any difficulty performing the following activities: 03/31/2015 07/29/2014  Hearing? N N  Vision? N N  Difficulty concentrating or making decisions? N N  Walking or climbing stairs? N N  Dressing or bathing? N N  Doing errands, shopping? N N  Preparing Food and eating ? N -  Using the Toilet? N -  In the past six months, have you accidently leaked urine? Y -  Do you have problems with loss of bowel control? N -  Managing your Medications? N -  Managing your Finances? N -  Housekeeping or managing your Housekeeping? N -    Patient Care Team: Colon Branch, MD as PCP - General (Internal Medicine) Jari Pigg, MD as Consulting Physician (Dermatology) Mardene Speak, DO as Consulting Physician (Optometry) Darlin Coco, MD as Consulting Physician  (Cardiology) Elsie Saas, MD as Consulting Physician (Orthopedic Surgery) Domingo Mend, DMD as Consulting Physician (Dentistry)    Assessment:   Assessment completed by Dr. Larose Kells during [facility].     Exercise Activities and Dietary recommendations Current Exercise Habits:: Home exercise routine, Type of exercise: strength training/weights;walking (Cardio--eliptical), Time (Minutes): 60, Frequency (Times/Week): 5, Weekly Exercise (Minutes/Week): 300   Diet: Regular diet. Relatively healthy.    Goals    . Lose 20 lbs by next year.        Fall Risk Fall Risk  03/31/2015 02/09/2015 01/28/2014 07/14/2013  Falls in the past year? Yes No No No  Number falls in past yr: 1 - - -  Injury with Fall? No - - -  Follow up Education provided;Falls prevention discussed - - -   Depression Screen PHQ 2/9 Scores 03/31/2015 02/09/2015 01/28/2014 07/14/2013  PHQ - 2 Score 0 0 0 0     Cognitive Testing MMSE - Mini Mental State Exam 03/31/2015  Orientation to time 5  Orientation to Place 5  Registration 3  Attention/ Calculation 5  Recall 3  Language- name 2 objects 2  Language- repeat 1  Language- follow 3 step command 3  Language- read & follow direction 1  Write a sentence 1  Copy design 0  Total score 29    Immunization History  Administered Date(s) Administered  . Influenza Split 01/03/2012  . Influenza Whole 01/07/2007, 11/27/2007, 03/26/2008, 11/12/2008, 11/04/2009  . Influenza,inj,Quad PF,36+ Mos 01/28/2014  . Influenza-Unspecified 12/23/2014  . Pneumococcal Conjugate-13 02/09/2015  . Pneumococcal Polysaccharide-23 01/02/2011  . Tdap 01/02/2011  . Zoster 02/07/2007   Screening Tests Health Maintenance  Topic Date Due  . INFLUENZA VACCINE  10/05/2015  . MAMMOGRAM  12/14/2015  . COLONOSCOPY  01/04/2020  . TETANUS/TDAP  01/01/2021  . DEXA SCAN  Completed  . ZOSTAVAX  Completed  . Hepatitis C Screening  Completed  . PNA vac Low Risk Adult  Completed      Plan:  Continue  to eat heart healthy diet (full of fruits, vegetables, whole grains, lean protein, water--limit salt, fat, and sugar intake) and remain physically active.    Check the blood pressure monthly  Be sure your blood pressure is between 110/65 and 145/85; if it is consistently higher or lower, call the office  Continue doing brain stimulating activities (puzzles, reading, adult coloring books, staying active) to keep memory sharp.  Try journaling to help with emotional health.    Follow up with Dr. Larose Kells as scheduled.      During the course of the visit the patient was educated and counseled  about the following appropriate screening and preventive services:   Vaccines to include Pneumoccal, Influenza, Hepatitis B, Td, Zostavax, HCV  Electrocardiogram  Cardiovascular Disease  Colorectal cancer screening  Bone density screening  Diabetes screening  Glaucoma screening  Mammography/PAP  Nutrition counseling   Patient Instructions (the written plan) was given to the patient.   Rudene Anda, RN  03/31/2015  Agree, French Ana MD

## 2015-03-31 NOTE — Progress Notes (Signed)
Pre visit review using our clinic review tool, if applicable. No additional management support is needed unless otherwise documented below in the visit note. 

## 2015-03-31 NOTE — Patient Instructions (Addendum)
Continue to eat heart healthy diet (full of fruits, vegetables, whole grains, lean protein, water--limit salt, fat, and sugar intake) and remain physically active.  Check the blood pressure monthly  Be sure your blood pressure is between 110/65 and 145/85; if it is consistently higher or lower, call the office  Continue doing brain stimulating activities (puzzles, reading, adult coloring books, staying active) to keep memory sharp.  Try journaling to help with emotional health.    Follow up with Dr. Larose Kells as scheduled.    Fall Prevention in the Home  Falls can cause injuries. They can happen to people of all ages. There are many things you can do to make your home safe and to help prevent falls.  WHAT CAN I DO ON THE OUTSIDE OF MY HOME?  Regularly fix the edges of walkways and driveways and fix any cracks.  Remove anything that might make you trip as you walk through a door, such as a raised step or threshold.  Trim any bushes or trees on the path to your home.  Use bright outdoor lighting.  Clear any walking paths of anything that might make someone trip, such as rocks or tools.  Regularly check to see if handrails are loose or broken. Make sure that both sides of any steps have handrails.  Any raised decks and porches should have guardrails on the edges.  Have any leaves, snow, or ice cleared regularly.  Use sand or salt on walking paths during winter.  Clean up any spills in your garage right away. This includes oil or grease spills. WHAT CAN I DO IN THE BATHROOM?   Use night lights.  Install grab bars by the toilet and in the tub and shower. Do not use towel bars as grab bars.  Use non-skid mats or decals in the tub or shower.  If you need to sit down in the shower, use a plastic, non-slip stool.  Keep the floor dry. Clean up any water that spills on the floor as soon as it happens.  Remove soap buildup in the tub or shower regularly.  Attach bath mats securely  with double-sided non-slip rug tape.  Do not have throw rugs and other things on the floor that can make you trip. WHAT CAN I DO IN THE BEDROOM?  Use night lights.  Make sure that you have a light by your bed that is easy to reach.  Do not use any sheets or blankets that are too big for your bed. They should not hang down onto the floor.  Have a firm chair that has side arms. You can use this for support while you get dressed.  Do not have throw rugs and other things on the floor that can make you trip. WHAT CAN I DO IN THE KITCHEN?  Clean up any spills right away.  Avoid walking on wet floors.  Keep items that you use a lot in easy-to-reach places.  If you need to reach something above you, use a strong step stool that has a grab bar.  Keep electrical cords out of the way.  Do not use floor polish or wax that makes floors slippery. If you must use wax, use non-skid floor wax.  Do not have throw rugs and other things on the floor that can make you trip. WHAT CAN I DO WITH MY STAIRS?  Do not leave any items on the stairs.  Make sure that there are handrails on both sides of the stairs and use  them. Fix handrails that are broken or loose. Make sure that handrails are as long as the stairways.  Check any carpeting to make sure that it is firmly attached to the stairs. Fix any carpet that is loose or worn.  Avoid having throw rugs at the top or bottom of the stairs. If you do have throw rugs, attach them to the floor with carpet tape.  Make sure that you have a light switch at the top of the stairs and the bottom of the stairs. If you do not have them, ask someone to add them for you. WHAT ELSE CAN I DO TO HELP PREVENT FALLS?  Wear shoes that:  Do not have high heels.  Have rubber bottoms.  Are comfortable and fit you well.  Are closed at the toe. Do not wear sandals.  If you use a stepladder:  Make sure that it is fully opened. Do not climb a closed  stepladder.  Make sure that both sides of the stepladder are locked into place.  Ask someone to hold it for you, if possible.  Clearly mark and make sure that you can see:  Any grab bars or handrails.  First and last steps.  Where the edge of each step is.  Use tools that help you move around (mobility aids) if they are needed. These include:  Canes.  Walkers.  Scooters.  Crutches.  Turn on the lights when you go into a dark area. Replace any light bulbs as soon as they burn out.  Set up your furniture so you have a clear path. Avoid moving your furniture around.  If any of your floors are uneven, fix them.  If there are any pets around you, be aware of where they are.  Review your medicines with your doctor. Some medicines can make you feel dizzy. This can increase your chance of falling. Ask your doctor what other things that you can do to help prevent falls.   This information is not intended to replace advice given to you by your health care provider. Make sure you discuss any questions you have with your health care provider.   Document Released: 12/17/2008 Document Revised: 07/07/2014 Document Reviewed: 03/27/2014 Elsevier Interactive Patient Education 2016 [employer].  Health Maintenance, Female Adopting a healthy lifestyle and getting preventive care can go a long way to promote health and wellness. Talk with your health care provider about what schedule of regular examinations is right for you. This is a good chance for you to check in with your provider about disease prevention and staying healthy. In between checkups, there are plenty of things you can do on your own. Experts have done a lot of research about which lifestyle changes and preventive measures are most likely to keep you healthy. Ask your health care provider for more information. WEIGHT AND DIET  Eat a healthy diet  Be sure to include plenty of vegetables, fruits, low-fat dairy products, and  lean protein.  Do not eat a lot of foods high in solid fats, added sugars, or salt.  Get regular exercise. This is one of the most important things you can do for your health.  Most adults should exercise for at least 150 minutes each week. The exercise should increase your heart rate and make you sweat (moderate-intensity exercise).  Most adults should also do strengthening exercises at least twice a week. This is in addition to the moderate-intensity exercise.  Maintain a healthy weight  Body mass index (BMI) is  a measurement that can be used to identify possible weight problems. It estimates body fat based on height and weight. Your health care provider can help determine your BMI and help you achieve or maintain a healthy weight.  For females 43 years of age and older:   A BMI below 18.5 is considered underweight.  A BMI of 18.5 to 24.9 is normal.  A BMI of 25 to 29.9 is considered overweight.  A BMI of 30 and above is considered obese.  Watch levels of cholesterol and blood lipids  You should start having your blood tested for lipids and cholesterol at 70 years of age, then have this test every 5 years.  You may need to have your cholesterol levels checked more often if:  Your lipid or cholesterol levels are high.  You are older than 70 years of age.  You are at high risk for heart disease.  CANCER SCREENING   Lung Cancer  Lung cancer screening is recommended for adults 33-47 years old who are at high risk for lung cancer because of a history of smoking.  A yearly low-dose CT scan of the lungs is recommended for people who:  Currently smoke.  Have quit within the past 15 years.  Have at least a 30-pack-year history of smoking. A pack year is smoking an average of one pack of cigarettes a day for 1 year.  Yearly screening should continue until it has been 15 years since you quit.  Yearly screening should stop if you develop a health problem that would prevent  you from having lung cancer treatment.  Breast Cancer  Practice breast self-awareness. This means understanding how your breasts normally appear and feel.  It also means doing regular breast self-exams. Let your health care provider know about any changes, no matter how small.  If you are in your 20s or 30s, you should have a clinical breast exam (CBE) by a health care provider every 1-3 years as part of a regular health exam.  If you are 45 or older, have a CBE every year. Also consider having a breast X-ray (mammogram) every year.  If you have a family history of breast cancer, talk to your health care provider about genetic screening.  If you are at high risk for breast cancer, talk to your health care provider about having an MRI and a mammogram every year.  Breast cancer gene (BRCA) assessment is recommended for women who have family members with BRCA-related cancers. BRCA-related cancers include:  Breast.  Ovarian.  Tubal.  Peritoneal cancers.  Results of the assessment will determine the need for genetic counseling and BRCA1 and BRCA2 testing. Cervical Cancer Your health care provider may recommend that you be screened regularly for cancer of the pelvic organs (ovaries, uterus, and vagina). This screening involves a pelvic examination, including checking for microscopic changes to the surface of your cervix (Pap test). You may be encouraged to have this screening done every 3 years, beginning at age 65.  For women ages 57-65, health care providers may recommend pelvic exams and Pap testing every 3 years, or they may recommend the Pap and pelvic exam, combined with testing for human papilloma virus (HPV), every 5 years. Some types of HPV increase your risk of cervical cancer. Testing for HPV may also be done on women of any age with unclear Pap test results.  Other health care providers may not recommend any screening for nonpregnant women who are considered low risk for pelvic  cancer  and who do not have symptoms. Ask your health care provider if a screening pelvic exam is right for you.  If you have had past treatment for cervical cancer or a condition that could lead to cancer, you need Pap tests and screening for cancer for at least 20 years after your treatment. If Pap tests have been discontinued, your risk factors (such as having a new sexual partner) need to be reassessed to determine if screening should resume. Some women have medical problems that increase the chance of getting cervical cancer. In these cases, your health care provider may recommend more frequent screening and Pap tests. Colorectal Cancer  This type of cancer can be detected and often prevented.  Routine colorectal cancer screening usually begins at 70 years of age and continues through 70 years of age.  Your health care provider may recommend screening at an earlier age if you have risk factors for colon cancer.  Your health care provider may also recommend using home test kits to check for hidden blood in the stool.  A small camera at the end of a tube can be used to examine your colon directly (sigmoidoscopy or colonoscopy). This is done to check for the earliest forms of colorectal cancer.  Routine screening usually begins at age 79.  Direct examination of the colon should be repeated every 5-10 years through 70 years of age. However, you may need to be screened more often if early forms of precancerous polyps or small growths are found. Skin Cancer  Check your skin from head to toe regularly.  Tell your health care provider about any new moles or changes in moles, especially if there is a change in a mole's shape or color.  Also tell your health care provider if you have a mole that is larger than the size of a pencil eraser.  Always use sunscreen. Apply sunscreen liberally and repeatedly throughout the day.  Protect yourself by wearing long sleeves, pants, a wide-brimmed hat, and  sunglasses whenever you are outside. HEART DISEASE, DIABETES, AND HIGH BLOOD PRESSURE   High blood pressure causes heart disease and increases the risk of stroke. High blood pressure is more likely to develop in:  People who have blood pressure in the high end of the normal range (130-139/85-89 mm Hg).  People who are overweight or obese.  People who are African American.  If you are 45-36 years of age, have your blood pressure checked every 3-5 years. If you are 61 years of age or older, have your blood pressure checked every year. You should have your blood pressure measured twice--once when you are at a hospital or clinic, and once when you are not at a hospital or clinic. Record the average of the two measurements. To check your blood pressure when you are not at a hospital or clinic, you can use:  An automated blood pressure machine at a pharmacy.  A home blood pressure monitor.  If you are between 54 years and 65 years old, ask your health care provider if you should take aspirin to prevent strokes.  Have regular diabetes screenings. This involves taking a blood sample to check your fasting blood sugar level.  If you are at a normal weight and have a low risk for diabetes, have this test once every three years after 70 years of age.  If you are overweight and have a high risk for diabetes, consider being tested at a younger age or more often. PREVENTING INFECTION  Hepatitis  B  If you have a higher risk for hepatitis B, you should be screened for this virus. You are considered at high risk for hepatitis B if:  You were born in a country where hepatitis B is common. Ask your health care provider which countries are considered high risk.  Your parents were born in a high-risk country, and you have not been immunized against hepatitis B (hepatitis B vaccine).  You have HIV or AIDS.  You use needles to inject street drugs.  You live with someone who has hepatitis B.  You have  had sex with someone who has hepatitis B.  You get hemodialysis treatment.  You take certain medicines for conditions, including cancer, organ transplantation, and autoimmune conditions. Hepatitis C  Blood testing is recommended for:  Everyone born from [birth year] through 1965.  Anyone with known risk factors for hepatitis C. Sexually transmitted infections (STIs)  You should be screened for sexually transmitted infections (STIs) including gonorrhea and chlamydia if:  You are sexually active and are younger than 70 years of age.  You are older than 70 years of age and your health care provider tells you that you are at risk for this type of infection.  Your sexual activity has changed since you were last screened and you are at an increased risk for chlamydia or gonorrhea. Ask your health care provider if you are at risk.  If you do not have HIV, but are at risk, it may be recommended that you take a prescription medicine daily to prevent HIV infection. This is called pre-exposure prophylaxis (PrEP). You are considered at risk if:  You are sexually active and do not regularly use condoms or know the HIV status of your partner(s).  You take drugs by injection.  You are sexually active with a partner who has HIV. Talk with your health care provider about whether you are at high risk of being infected with HIV. If you choose to begin PrEP, you should first be tested for HIV. You should then be tested every 3 months for as long as you are taking PrEP.  PREGNANCY   If you are premenopausal and you may become pregnant, ask your health care provider about preconception counseling.  If you may become pregnant, take 400 to 800 micrograms (mcg) of folic acid every day.  If you want to prevent pregnancy, talk to your health care provider about birth control (contraception). OSTEOPOROSIS AND MENOPAUSE   Osteoporosis is a disease in which the bones lose minerals and strength with aging. This can  result in serious bone fractures. Your risk for osteoporosis can be identified using a bone density scan.  If you are 51 years of age or older, or if you are at risk for osteoporosis and fractures, ask your health care provider if you should be screened.  Ask your health care provider whether you should take a calcium or vitamin D supplement to lower your risk for osteoporosis.  Menopause may have certain physical symptoms and risks.  Hormone replacement therapy may reduce some of these symptoms and risks. Talk to your health care provider about whether hormone replacement therapy is right for you.  HOME CARE INSTRUCTIONS   Schedule regular health, dental, and eye exams.  Stay current with your immunizations.   Do not use any tobacco products including cigarettes, chewing tobacco, or electronic cigarettes.  If you are pregnant, do not drink alcohol.  If you are breastfeeding, limit how much and how often you drink alcohol.  Limit alcohol intake to no more than 1 drink per day for nonpregnant women. One drink equals 12 ounces of beer, 5 ounces of wine, or 1 ounces of hard liquor.  Do not use street drugs.  Do not share needles.  Ask your health care provider for help if you need support or information about quitting drugs.  Tell your health care provider if you often feel depressed.  Tell your health care provider if you have ever been abused or do not feel safe at home.   This information is not intended to replace advice given to you by your health care provider. Make sure you discuss any questions you have with your health care provider.   Document Released: 09/05/2010 Document Revised: 03/13/2014 Document Reviewed: 01/22/2013 Elsevier Interactive Patient Education [employer].

## 2015-09-09 ENCOUNTER — Ambulatory Visit: Payer: Medicare HMO | Admitting: Internal Medicine

## 2015-09-10 ENCOUNTER — Other Ambulatory Visit: Payer: Self-pay | Admitting: Internal Medicine

## 2015-09-10 ENCOUNTER — Encounter: Payer: Self-pay | Admitting: Internal Medicine

## 2015-09-10 ENCOUNTER — Ambulatory Visit: Payer: Self-pay | Admitting: Internal Medicine

## 2015-09-10 MED ORDER — LEVOTHYROXINE SODIUM 50 MCG PO TABS: 50.0000 ug | ORAL_TABLET | Freq: Every day | ORAL | Status: DC

## 2015-09-10 NOTE — Telephone Encounter (Signed)
Levothyroxine sent to [facility]. Pt informed via MyChart.

## 2015-09-14 ENCOUNTER — Encounter: Payer: Self-pay | Admitting: Internal Medicine

## 2015-09-14 ENCOUNTER — Ambulatory Visit (INDEPENDENT_AMBULATORY_CARE_PROVIDER_SITE_OTHER): Payer: Medicare HMO | Admitting: Internal Medicine

## 2015-09-14 VITALS — BP 122/60 | HR 64 | Temp 98.4°F | Ht 60.0 in | Wt 123.2 lb

## 2015-09-14 DIAGNOSIS — E039 Hypothyroidism, unspecified: Secondary | ICD-10-CM | POA: Diagnosis not present

## 2015-09-14 DIAGNOSIS — M810 Age-related osteoporosis without current pathological fracture: Secondary | ICD-10-CM | POA: Diagnosis not present

## 2015-09-14 DIAGNOSIS — I10 Essential (primary) hypertension: Secondary | ICD-10-CM | POA: Diagnosis not present

## 2015-09-14 LAB — TSH: TSH: 0.82 u[IU]/mL (ref 0.35–4.50)

## 2015-09-14 LAB — BASIC METABOLIC PANEL
BUN: 15 mg/dL (ref 6–23)
CHLORIDE: 104 meq/L (ref 96–112)
CO2: 29 mEq/L (ref 19–32)
Calcium: 9.5 mg/dL (ref 8.4–10.5)
Creatinine, Ser: 0.75 mg/dL (ref 0.40–1.20)
GFR: 81.19 mL/min (ref >60.00)
Glucose, Bld: 92 mg/dL (ref 70–99)
POTASSIUM: 3.7 meq/L (ref 3.5–5.1)
SODIUM: 140 meq/L (ref 135–145)

## 2015-09-14 NOTE — Patient Instructions (Signed)
GO TO THE LAB : Get the blood work     GO TO THE FRONT DESK Schedule your next appointment for a  Physical exam by 12-2017c

## 2015-09-14 NOTE — Progress Notes (Signed)
Subjective:    Patient ID: [name], female    DOB: [birth year], 70 y.o.   MRN: [id number]  DOS:  09/14/2015 Type of visit - description : Routine visit Interval history:  HTN: Good compliance with meds, ambulatory BPs normal at the [facility] and @ her dentist but when she checks at home is 150/80. Osteoporosis: On Calcium, vitamin D, very active. Some anxiety related to family issues, counseled Med list reviewed --- Good med compliance.  Review of Systems  Denies chest pain or difficulty breathing No headache or dizziness Past Medical History  Diagnosis Date  . Hyperlipidemia   . Hypertension   . Osteoporosis   . Hypothyroidism   . 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 SCC (squamous cell carcinoma)     Past Surgical History  Procedure Laterality Date  . Abdominal hysterectomy      no oophorectomy  . Breast lumpectomy      x2 left breast  . Tonsillectomy    . Squamous cell carcinoma excision      RLE  . R leg liposarcoma  1973    Social History   Social History  . Marital Status: Married    Spouse Name: N/A  . Number of Children: 2  . Years of Education: N/A   Occupational History  . retired -- Art therapist, class Research scientist (medical)     Social History Main Topics  . Smoking status: Never Smoker   . Smokeless tobacco: Not on file  . Alcohol Use: No  . Drug Use: No  . Sexual Activity: Yes    Birth Control/ Protection: Post-menopausal   Other Topics Concern  . Not on file   Social History Narrative   Lost a son when he was 51 y/o   Lives w/ husband         Medication List       This list is accurate as of: 09/14/15 12:53 PM.  Always use your most recent med list.               amLODipine 5 MG tablet  Commonly known as:  NORVASC  Take 1 tablet (5 mg total) by mouth daily.     aspirin 81 MG EC tablet  Take 81 mg by mouth daily.     atorvastatin 20 MG tablet  Commonly known as:  LIPITOR  Take 1 tablet (20 mg total) by mouth daily.     calcium citrate-vitamin D 315-200 MG-UNIT tablet  Commonly known as:  CITRACAL+D  Take 1 tablet by mouth. 600 mg bid     cholecalciferol 1000 units tablet  Commonly known as:  VITAMIN D  Take 1,000 Units by mouth daily.     Fish Oil 1200 MG Caps  Take 1 capsule by mouth 2 (two) times daily.     ibuprofen 200 MG tablet  Commonly known as:  ADVIL,MOTRIN  Take 200 mg by mouth at bedtime.     levothyroxine 50 MCG tablet  Commonly known as:  SYNTHROID, LEVOTHROID  Take 1 tablet (50 mcg total) by mouth daily before breakfast.     multivitamin tablet  Take 1 tablet by mouth daily.     RECLAST 5 MG/100ML Soln injection  Generic drug:  zoledronic acid  Inject 5 mg into the vein once.     RESTASIS 0.05 % ophthalmic emulsion  Generic drug:  cycloSPORINE  1 drop 2 (two) times daily.     vitamin E 400 UNIT capsule  Take 400 Units by mouth daily.           Objective:   Physical Exam BP 122/60 mmHg  Pulse 64  Temp(Src) 98.4 F (36.9 C) (Oral)  Ht 5' (1.524 m)  Wt 123 lb 4 oz (55.906 kg)  BMI 24.07 kg/m2  SpO2 97% General:   Well developed, well nourished . NAD.  HEENT:  Normocephalic . Face symmetric, atraumatic Lungs:  CTA B Normal respiratory effort, no intercostal retractions, no accessory muscle use. Heart: RRR,  no murmur.  No pretibial edema bilaterally  Skin: Not pale. Not jaundice Neurologic:  alert & oriented X3.  Speech normal, gait appropriate for age and unassisted Psych--  Cognition and judgment appear intact.  Cooperative with normal attention span and concentration.  Behavior appropriate. No anxious or depressed appearing.      Assessment & Plan:   Assessment> HTN Hyperlipidemia Hypothyroidism Osteoporosis --T score 2013: -2.6,  T score 02-2014  -2.5 after  Reclast x 3. Last reclast 05-2013. --Normal Vit D 2014 SCC, skin cancer, right lower extremity, sees derm q 6 months Liposarcoma right leg 1972  PLAN: HTN: BPs at home elevated but  otherwise normal,see HPI, recommend to check accuracy of her home machine Hypothyroidism: Refill meds, check a TSH Osteoporosis: Check a DEXA, may need Reclast, check a BMP in preparation for the infusion. RTC 02/2016, CPX

## 2015-09-14 NOTE — Assessment & Plan Note (Signed)
HTN: BPs at home elevated but otherwise normal,see HPI, recommend to check accuracy of her home machine Hypothyroidism: Refill meds, check a TSH Osteoporosis: Check a DEXA, may need Reclast, check a BMP in preparation for the infusion. RTC 02/2016, CPX

## 2015-09-14 NOTE — Progress Notes (Signed)
Pre visit review using our clinic review tool, if applicable. No additional management support is needed unless otherwise documented below in the visit note. 

## 2015-09-28 ENCOUNTER — Telehealth: Payer: Self-pay | Admitting: Internal Medicine

## 2015-09-28 NOTE — Telephone Encounter (Signed)
Pt called in because she says that she received a bill for her visit DOS 09/14/15. She says that on the bill it's showing that she owes a 10.00 co-pay. Pt says that it's also reflecting the 10.00 co-pay paid the day of the visit. Pt says that she had labs on visit also. Pt would like to have someone to look into the bill for her to confirm if charges are legitimate.   Please assist further.

## 2015-10-01 NOTE — Telephone Encounter (Signed)
Pt called in to follow up. I spoke with Team Lead and was advised that 10.00 is pt's responsibility for labs on DOS. Informed pt, expressed understanding.   She says that she will call back if needed.

## 2015-12-04 ENCOUNTER — Other Ambulatory Visit: Payer: Self-pay | Admitting: Internal Medicine

## 2015-12-15 LAB — HM MAMMOGRAPHY

## 2015-12-20 ENCOUNTER — Encounter: Payer: Self-pay | Admitting: Internal Medicine

## 2015-12-29 ENCOUNTER — Other Ambulatory Visit: Payer: Self-pay | Admitting: Internal Medicine

## 2016-02-11 ENCOUNTER — Ambulatory Visit (INDEPENDENT_AMBULATORY_CARE_PROVIDER_SITE_OTHER): Payer: Medicare HMO | Admitting: Internal Medicine

## 2016-02-11 ENCOUNTER — Encounter: Payer: Self-pay | Admitting: Internal Medicine

## 2016-02-11 VITALS — BP 124/62 | HR 64 | Temp 98.2°F | Resp 14 | Ht 60.0 in | Wt 124.4 lb

## 2016-02-11 DIAGNOSIS — Z0000 Encounter for general adult medical examination without abnormal findings: Secondary | ICD-10-CM | POA: Diagnosis not present

## 2016-02-11 LAB — CBC WITH DIFFERENTIAL/PLATELET
BASOS PCT: 0.3 % (ref 0.0–3.0)
Basophils Absolute: 0 10*3/uL (ref 0.0–0.1)
EOS ABS: 0.1 10*3/uL (ref 0.0–0.7)
EOS PCT: 1.2 % (ref 0.0–5.0)
HEMATOCRIT: 42.7 % (ref 36.0–46.0)
HEMOGLOBIN: 14.4 g/dL (ref 12.0–15.0)
LYMPHS PCT: 21.7 % (ref 12.0–46.0)
Lymphs Abs: 1.7 10*3/uL (ref 0.7–4.0)
MCHC: 33.8 g/dL (ref 30.0–36.0)
MCV: 89.2 fl (ref 78.0–100.0)
MONO ABS: 0.5 10*3/uL (ref 0.1–1.0)
Monocytes Relative: 6.1 % (ref 3.0–12.0)
Neutro Abs: 5.7 10*3/uL (ref 1.4–7.7)
Neutrophils Relative %: 70.7 % (ref 43.0–77.0)
Platelets: 358 10*3/uL (ref 150.0–400.0)
RBC: 4.78 Mil/uL (ref 3.87–5.11)
RDW: 13.4 % (ref 11.5–15.5)
WBC: 8.1 10*3/uL (ref 4.0–10.5)

## 2016-02-11 LAB — BASIC METABOLIC PANEL
BUN: 14 mg/dL (ref 6–23)
CHLORIDE: 102 meq/L (ref 96–112)
CO2: 32 meq/L (ref 19–32)
CREATININE: 0.8 mg/dL (ref 0.40–1.20)
Calcium: 9.8 mg/dL (ref 8.4–10.5)
GFR: 75.27 mL/min (ref >60.00)
GLUCOSE: 102 mg/dL — AB (ref 70–99)
Potassium: 3.8 mEq/L (ref 3.5–5.1)
Sodium: 141 mEq/L (ref 135–145)

## 2016-02-11 LAB — AST: AST: 19 U/L (ref 0–37)

## 2016-02-11 LAB — TSH: TSH: 0.75 u[IU]/mL (ref 0.35–4.50)

## 2016-02-11 LAB — LIPID PANEL
CHOLESTEROL: 196 mg/dL (ref 0–200)
HDL: 62.3 mg/dL (ref >39.00)
LDL Cholesterol: 116 mg/dL — ABNORMAL HIGH (ref 0–99)
NONHDL: 133.83
Total CHOL/HDL Ratio: 3
Triglycerides: 90 mg/dL (ref 0.0–149.0)
VLDL: 18 mg/dL (ref 0.0–40.0)

## 2016-02-11 LAB — ALT: ALT: 19 U/L (ref 0–35)

## 2016-02-11 NOTE — Patient Instructions (Addendum)
Get your blood work before you leave   Next visit in 8 months     Fall Prevention and Home Safety Falls cause injuries and can affect all age groups. It is possible to use preventive measures to significantly decrease the likelihood of falls. There are many simple measures which can make your home safer and prevent falls. OUTDOORS  Repair cracks and edges of walkways and driveways.  Remove high doorway thresholds.  Trim shrubbery on the main path into your home.  Have good outside lighting.  Clear walkways of tools, rocks, debris, and clutter.  Check that handrails are not broken and are securely fastened. Both sides of steps should have handrails.  Have leaves, snow, and ice cleared regularly.  Use sand or salt on walkways during winter months.  In the garage, clean up grease or oil spills. BATHROOM  Install night lights.  Install grab bars by the toilet and in the tub and shower.  Use non-skid mats or decals in the tub or shower.  Place a plastic non-slip stool in the shower to sit on, if needed.  Keep floors dry and clean up all water on the floor immediately.  Remove soap buildup in the tub or shower on a regular basis.  Secure bath mats with non-slip, double-sided rug tape.  Remove throw rugs and tripping hazards from the floors. BEDROOMS  Install night lights.  Make sure a bedside light is easy to reach.  Do not use oversized bedding.  Keep a telephone by your bedside.  Have a firm chair with side arms to use for getting dressed.  Remove throw rugs and tripping hazards from the floor. KITCHEN  Keep handles on pots and pans turned toward the center of the stove. Use back burners when possible.  Clean up spills quickly and allow time for drying.  Avoid walking on wet floors.  Avoid hot utensils and knives.  Position shelves so they are not too high or low.  Place commonly used objects within easy reach.  If necessary, use a sturdy step stool  with a grab bar when reaching.  Keep electrical cables out of the way.  Do not use floor polish or wax that makes floors slippery. If you must use wax, use non-skid floor wax.  Remove throw rugs and tripping hazards from the floor. STAIRWAYS  Never leave objects on stairs.  Place handrails on both sides of stairways and use them. Fix any loose handrails. Make sure handrails on both sides of the stairways are as long as the stairs.  Check carpeting to make sure it is firmly attached along stairs. Make repairs to worn or loose carpet promptly.  Avoid placing throw rugs at the top or bottom of stairways, or properly secure the rug with carpet tape to prevent slippage. Get rid of throw rugs, if possible.  Have an electrician put in a light switch at the top and bottom of the stairs. OTHER FALL PREVENTION TIPS  Wear low-heel or rubber-soled shoes that are supportive and fit well. Wear closed toe shoes.  When using a stepladder, make sure it is fully opened and both spreaders are firmly locked. Do not climb a closed stepladder.  Add color or contrast paint or tape to grab bars and handrails in your home. Place contrasting color strips on first and last steps.  Learn and use mobility aids as needed. Install an electrical emergency response system.  Turn on lights to avoid dark areas. Replace light bulbs that burn out immediately.   Get light switches that glow.  Arrange furniture to create clear pathways. Keep furniture in the same place.  Firmly attach carpet with non-skid or double-sided tape.  Eliminate uneven floor surfaces.  Select a carpet pattern that does not visually hide the edge of steps.  Be aware of all pets. OTHER HOME SAFETY TIPS  Set the water temperature for 120 F (48.8 C).  Keep emergency numbers on or near the telephone.  Keep smoke detectors on every level of the home and near sleeping areas. Document Released: 02/10/2002 Document Revised: 08/22/2011  Document Reviewed: 05/12/2011 ExitCare Patient Information 2015 [employer]. This information is not intended to replace advice given to you by your health care provider. Make sure you discuss any questions you have with your health care provider.   Preventive Care for Adults Ages 65 and over  Blood pressure check.** / Every 1 to 2 years.  Lipid and cholesterol check.**/ Every 5 years beginning at age 20.  Lung cancer screening. / Every year if you are aged 55-80 years and have a 30-pack-year history of smoking and currently smoke or have quit within the past 15 years. Yearly screening is stopped once you have quit smoking for at least 15 years or develop a health problem that would prevent you from having lung cancer treatment.  Fecal occult blood test (FOBT) of stool. / Every year beginning at age 50 and continuing until age 75. You may not have to do this test if you get a colonoscopy every 10 years.  Flexible sigmoidoscopy** or colonoscopy.** / Every 5 years for a flexible sigmoidoscopy or every 10 years for a colonoscopy beginning at age 50 and continuing until age 75.  Hepatitis C blood test.** / For all people born from 1945 through 1965 and any individual with known risks for hepatitis C.  Abdominal aortic aneurysm (AAA) screening.** / A one-time screening for ages 65 to 75 years who are current or former smokers.  Skin self-exam. / Monthly.  Influenza vaccine. / Every year.  Tetanus, diphtheria, and acellular pertussis (Tdap/Td) vaccine.** / 1 dose of Td every 10 years.  Varicella vaccine.** / Consult your health care provider.  Zoster vaccine.** / 1 dose for adults aged 60 years or older.  Pneumococcal 13-valent conjugate (PCV13) vaccine.** / Consult your health care provider.  Pneumococcal polysaccharide (PPSV23) vaccine.** / 1 dose for all adults aged 65 years and older.  Meningococcal vaccine.** / Consult your health care provider.  Hepatitis A vaccine.** /  Consult your health care provider.  Hepatitis B vaccine.** / Consult your health care provider.  Haemophilus influenzae type b (Hib) vaccine.** / Consult your health care provider. **Family history and personal history of risk and conditions may change your health care provider's recommendations. Document Released: 04/18/2001 Document Revised: 02/25/2013 Document Reviewed: 07/18/2010 ExitCare Patient Information 2015 [employer]. This information is not intended to replace advice given to you by your health care provider. Make sure you discuss any questions you have with your health care provider.   

## 2016-02-11 NOTE — Progress Notes (Signed)
Pre visit review using our clinic review tool, if applicable. No additional management support is needed unless otherwise documented below in the visit note. 

## 2016-02-11 NOTE — Progress Notes (Signed)
Subjective:    Patient ID: [name], female    DOB: 1945/07/26, 70 y.o.   MRN: [id number]  DOS:  02/11/2016 Type of visit - description : cpx Interval history: No major concerns, good compliance with medication, she has an excellent lifestyle   Review of Systems Constitutional: No fever. No chills. No unexplained wt changes. No unusual sweats  HEENT: No dental problems, no ear discharge, no facial swelling, no voice changes. No eye discharge, no eye  redness , no  intolerance to light   Respiratory: No wheezing , no  difficulty breathing. Dry cough for a few days, no other symptoms  Cardiovascular: No CP, no leg swelling , no  Palpitations  GI: no nausea, no vomiting, no diarrhea , no  abdominal pain.  No blood in the stools. No dysphagia, no odynophagia    Endocrine: No polyphagia, no polyuria , no polydipsia  GU: No dysuria, gross hematuria, difficulty urinating. No urinary urgency, no frequency.  Musculoskeletal: Occasional low back pain and left hip pain without radiation. Does not affect her ADLs, usually a low dose of Tylenol or ibuprofen OTC helps  Skin: No change in the color of the skin, palor , no  Rash  Allergic, immunologic: No environmental allergies , no  food allergies  Neurological: No dizziness no  syncope. No headaches. No diplopia, no slurred, no slurred speech, no motor deficits, no facial  Numbness  Hematological: No enlarged lymph nodes, no easy bruising , no unusual bleedings  Psychiatry: No suicidal ideas, no hallucinations, no beavior problems, no confusion.  No unusual/severe anxiety, no depression   Past Medical History:  Diagnosis Date  . Hyperlipidemia   . Hypertension   . Hypothyroidism   . 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 Osteoporosis   . SCC (squamous cell carcinoma)     Past Surgical History:  Procedure Laterality Date  . ABDOMINAL HYSTERECTOMY     no oophorectomy  . BREAST LUMPECTOMY     x2 left breast  . R leg  liposarcoma  1973  . SQUAMOUS CELL CARCINOMA EXCISION     RLE  . TONSILLECTOMY      Social History   Social History  . Marital status: Married    Spouse name: N/A  . Number of children: 2  . Years of education: N/A   Occupational History  . retired -- Art therapist, class Research scientist (medical)     Social History Main Topics  . Smoking status: Never Smoker  . Smokeless tobacco: Not on file  . Alcohol use No  . Drug use: No  . Sexual activity: Yes    Birth control/ protection: Post-menopausal   Other Topics Concern  . Not on file   Social History Narrative   Lost a son when he was 13 y/o   Lives w/ husband      Family History  Problem Relation Age of Onset  . Heart disease Mother     congenital heart defect  . Cancer Father     kidney cancer  . Cancer Brother     Brain Cancer  . Cancer Maternal Aunt     breast cancer  . Cancer Paternal Aunt     breast cancer  . Colon cancer Neg Hx   . Diabetes Neg Hx        Medication List       Accurate as of 02/11/16  3:35 PM. Always use your most recent med list.  amLODipine 5 MG tablet Commonly known as:  NORVASC TAKE 1 TABLET(5 MG) BY MOUTH DAILY   aspirin 81 MG EC tablet Take 81 mg by mouth daily.   atorvastatin 20 MG tablet Commonly known as:  LIPITOR TAKE 1 TABLET(20 MG) BY MOUTH DAILY   calcium citrate-vitamin D 315-200 MG-UNIT tablet Commonly known as:  CITRACAL+D Take 1 tablet by mouth. 600 mg bid   cholecalciferol 1000 units tablet Commonly known as:  VITAMIN D Take 1,000 Units by mouth daily.   Fish Oil 1200 MG Caps Take 1 capsule by mouth 2 (two) times daily.   ibuprofen 200 MG tablet Commonly known as:  ADVIL,MOTRIN Take 200 mg by mouth at bedtime.   levothyroxine 50 MCG tablet Commonly known as:  SYNTHROID, LEVOTHROID Take 1 tablet (50 mcg total) by mouth daily before breakfast.   multivitamin tablet Take 1 tablet by mouth daily.   RECLAST 5 MG/100ML Soln injection Generic  drug:  zoledronic acid Inject 5 mg into the vein once.   RESTASIS 0.05 % ophthalmic emulsion Generic drug:  cycloSPORINE 1 drop 2 (two) times daily.   vitamin E 400 UNIT capsule Take 400 Units by mouth daily.          Objective:   Physical Exam BP 124/62 (BP Location: Left Arm, Patient Position: Sitting, Cuff Size: Small)   Pulse 64   Temp 98.2 F (36.8 C) (Oral)   Resp 14   Ht 5' (1.524 m)   Wt 124 lb 6 oz (56.4 kg)   SpO2 98%   BMI 24.29 kg/m   General:   Well developed, well nourished . NAD.  Neck: No  thyromegaly  HEENT:  Normocephalic . Face symmetric, atraumatic Lungs:  CTA B Normal respiratory effort, no intercostal retractions, no accessory muscle use. Heart: RRR,  no murmur.  No pretibial edema bilaterally  Abdomen:  Not distended, soft, non-tender. No rebound or rigidity.   Skin: Exposed areas without rash. Not pale. Not jaundice Neurologic:  alert & oriented X3.  Speech normal, gait appropriate for age and unassisted Strength symmetric and appropriate for age.  Psych: Cognition and judgment appear intact.  Cooperative with normal attention span and concentration.  Behavior appropriate. No anxious or depressed appearing.    Assessment & Plan:   Assessment> HTN Hyperlipidemia Hypothyroidism Osteoporosis --T score 2013: -2.6,  T score 02-2014  -2.5 after  Reclast x 3. Last reclast 05-2013. --Normal Vit D 2014 SCC, skin cancer, right lower extremity, sees derm q 6 months Liposarcoma right leg 1972  PLAN: HTN: BP is excellent. Continue amlodipine. Hyperlipidemia: Continue Lipitor, check FLP Hypothyroidism: Continue Synthroid, check TSH Osteoporosis: She is very active, takes calcium and vitamin D. Bone density test to be done in few days. Sees dermatology regularly RTC a months

## 2016-02-11 NOTE — Assessment & Plan Note (Addendum)
Td 2012;  Pneumonia shot 2012, Prevnar 02-2015 ; Zostavax 2008; had a flu shot [facility]CS: 2011 colonoscopy normal Dr. Oletta Lamas Female care:  No further PAPs MMG 12-2015 wnl  Eat healthy, takes calcium, vitamin D. [facility]. Doing very well with lifestyle.

## 2016-02-11 NOTE — Assessment & Plan Note (Signed)
HTN: BP is excellent. Continue amlodipine. Hyperlipidemia: Continue Lipitor, check FLP Hypothyroidism: Continue Synthroid, check TSH Osteoporosis: She is very active, takes calcium and vitamin D. Bone density test to be done in few days. Sees dermatology regularly RTC a months

## 2016-02-14 LAB — HM DEXA SCAN

## 2016-03-02 ENCOUNTER — Other Ambulatory Visit: Payer: Self-pay

## 2016-03-02 MED ORDER — AMLODIPINE BESYLATE 5 MG PO TABS: 5.0000 mg | ORAL_TABLET | Freq: Every day | ORAL | 1 refills | Status: DC

## 2016-03-02 MED ORDER — ATORVASTATIN CALCIUM 20 MG PO TABS: 20.0000 mg | ORAL_TABLET | Freq: Every day | ORAL | 1 refills | Status: DC

## 2016-03-02 MED ORDER — LEVOTHYROXINE SODIUM 50 MCG PO TABS: 50.0000 ug | ORAL_TABLET | Freq: Every day | ORAL | 1 refills | Status: DC

## 2016-03-14 ENCOUNTER — Ambulatory Visit: Payer: Medicare HMO

## 2016-03-20 ENCOUNTER — Telehealth: Payer: Self-pay | Admitting: Internal Medicine

## 2016-03-20 NOTE — Telephone Encounter (Signed)
Bone density tests from 02/14/2016: Lowest T score is -2.4. She is status post 3 Reclast, T score has improved from -2.6. Send a message through my chart:  [name], your bone density test showed osteopenia, your numbers are improved compared 2 years ago. Continue taking calcium, vitamin D, stay active. Plan is to recheck another bone density test in 2-3 years.

## 2016-03-21 NOTE — Telephone Encounter (Signed)
Letter printed and mailed to Pt.  

## 2016-03-28 ENCOUNTER — Encounter: Payer: Self-pay | Admitting: Internal Medicine

## 2016-03-28 MED ORDER — AMLODIPINE BESYLATE 5 MG PO TABS: 5.0000 mg | ORAL_TABLET | Freq: Every day | ORAL | 1 refills | Status: DC

## 2016-03-28 MED ORDER — ATORVASTATIN CALCIUM 20 MG PO TABS: 20.0000 mg | ORAL_TABLET | Freq: Every day | ORAL | 1 refills | Status: DC

## 2016-04-04 ENCOUNTER — Encounter: Payer: Self-pay | Admitting: *Deleted

## 2016-04-04 ENCOUNTER — Ambulatory Visit (INDEPENDENT_AMBULATORY_CARE_PROVIDER_SITE_OTHER): Payer: Medicare HMO | Admitting: *Deleted

## 2016-04-04 ENCOUNTER — Ambulatory Visit: Payer: Medicare HMO | Admitting: *Deleted

## 2016-04-04 VITALS — BP 152/72 | HR 74 | Resp 16 | Ht 61.0 in | Wt 127.2 lb

## 2016-04-04 DIAGNOSIS — Z0000 Encounter for general adult medical examination without abnormal findings: Secondary | ICD-10-CM

## 2016-04-04 DIAGNOSIS — I10 Essential (primary) hypertension: Secondary | ICD-10-CM

## 2016-04-04 NOTE — Progress Notes (Addendum)
Subjective:   [name] is a 71 y.o. female who presents for Medicare Annual (Subsequent) preventive examination.  Review of Systems:  No ROS.  Medicare Wellness Visit.  Cardiac Risk Factors include: hypertension;advanced age (>76men, >27 women);dyslipidemia  Sleep patterns: no sleep issues, feels rested on waking, does not get up to void and sleeps 9 hours nightly.   Home Safety/Smoke Alarms: Feels safe in home. Smoke alarms in place. Carbon monoxide detectors in place.  Living environment; residence and Firearm Safety: Lives w/ husband. 1-story house/ trailer, firearms stored safely. Seat Belt Safety/Bike Helmet: Wears seat belt.   Counseling:   Eye Exam- Mardene Speak every 6 months. Dental- Dr. Sung Amabile every 6 months.   Female:   Pap- N/A, hysterectomy       Mammo- last 12/15/15. BI-RADS Category 1: Negative      Dexa scan- last 02/14/16. No report on file.     CCS- last 01/03/10. Normal. 10 year recall.    Objective:     Vitals: BP (!) 152/72 (BP Location: Right Arm, Patient Position: Sitting, Cuff Size: Normal)   Pulse 74   Resp 16   Ht 5\' 1"  (1.549 m)   Wt 127 lb 3.2 oz (57.7 kg)   SpO2 98%   BMI 24.03 kg/m   Body mass index is 24.03 kg/m.  Wt Readings from Last 3 Encounters:  04/04/16 127 lb 3.2 oz (57.7 kg)  02/11/16 124 lb 6 oz (56.4 kg)  09/14/15 123 lb 4 oz (55.9 kg)   BP Readings from Last 3 Encounters:  04/04/16 (!) 152/72  02/11/16 124/62  09/14/15 122/60    Tobacco History  Smoking Status  . Never Smoker  Smokeless Tobacco  . Never Used     Counseling given: Not Answered   Past Medical History:  Diagnosis Date  . Hyperlipidemia   . Hypertension   . Hypothyroidism   . 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 Osteoporosis   . SCC (squamous cell carcinoma)    Past Surgical History:  Procedure Laterality Date  . ABDOMINAL HYSTERECTOMY     no oophorectomy  . BREAST LUMPECTOMY     x2 left breast  . R leg liposarcoma  1973  .  SQUAMOUS CELL CARCINOMA EXCISION     RLE  . TONSILLECTOMY     Family History  Problem Relation Age of Onset  . Heart disease Mother     congenital heart defect  . Cancer Father     kidney cancer  . Cancer Brother     Brain Cancer  . Cancer Maternal Aunt     breast cancer  . Cancer Paternal Aunt     breast cancer  . Colon cancer Neg Hx   . Diabetes Neg Hx    History  Sexual Activity  . Sexual activity: No    Outpatient Encounter Prescriptions as of 04/04/2016  Medication Sig  . amLODipine (NORVASC) 5 MG tablet Take 1 tablet (5 mg total) by mouth daily.  Marland Kitchen aspirin 81 MG EC tablet Take 81 mg by mouth daily.    Marland Kitchen atorvastatin (LIPITOR) 20 MG tablet Take 1 tablet (20 mg total) by mouth daily.  . calcium citrate-vitamin D (CITRACAL+D) 315-200 MG-UNIT per tablet Take 1 tablet by mouth. 600 mg bid  . cholecalciferol (VITAMIN D) 1000 units tablet Take 1,000 Units by mouth daily.  . cycloSPORINE (RESTASIS) 0.05 % ophthalmic emulsion 1 drop 2 (two) times daily.    Marland Kitchen ibuprofen (ADVIL,MOTRIN) 200 MG  tablet Take 200 mg by mouth at bedtime.  Marland Kitchen levothyroxine (SYNTHROID, LEVOTHROID) 50 MCG tablet Take 1 tablet (50 mcg total) by mouth daily before breakfast.  . Multiple Vitamin (MULTIVITAMIN) tablet Take 1 tablet by mouth daily.    . Omega-3 Fatty Acids (FISH OIL) 1200 MG CAPS Take 1 capsule by mouth 2 (two) times daily.  . vitamin E 400 UNIT capsule Take 400 Units by mouth daily.  . [DISCONTINUED] zoledronic acid (RECLAST) 5 MG/100ML SOLN Inject 5 mg into the vein once.     No facility-administered encounter medications on file as of 04/04/2016.     Activities of Daily Living In your present state of health, do you have any difficulty performing the following activities: 04/04/2016 02/11/2016  Hearing? N N  Vision? N N  Difficulty concentrating or making decisions? N N  Walking or climbing stairs? N N  Dressing or bathing? N N  Doing errands, shopping? N N  Preparing Food and eating ? N  -  Using the Toilet? N -  In the past six months, have you accidently leaked urine? N -  Do you have problems with loss of bowel control? N -  Managing your Medications? N -  Managing your Finances? N -  Housekeeping or managing your Housekeeping? N -  Some recent data might be hidden    Patient Care Team: Colon Branch, MD as PCP - General (Internal Medicine) Jari Pigg, MD as Consulting Physician (Dermatology) Mardene Speak, DO as Consulting Physician (Optometry) Elsie Saas, MD as Consulting Physician (Orthopedic Surgery) Domingo Mend, DMD as Consulting Physician (Dentistry)    Assessment:    Physical assessment deferred to PCP.  Exercise Activities and Dietary recommendations Current Exercise Habits: Home exercise routine, Type of exercise: Other - see comments;strength training/weights (aerobics)  Diet (meal preparation, eat out, water intake, caffeinated beverages, dairy products, fruits and vegetables): in general, a "healthy" diet  , well balanced, on average, 2 meals per day.  Breakfast: smoothie w/ spinach, carrots, celery, bananas, apples, yogurt Lunch: vegetables, may have chicken or fish Dinner:  Meals vary  Goals    . Healthy Lifestyle (pt-stated)          Continue to eat heart healthy diet (full of fruits, vegetables, whole grains, lean protein, water--limit salt, fat, and sugar intake) and increase physical activity as tolerated. Continue doing brain stimulating activities (puzzles, reading, adult coloring books, staying active) to keep memory sharp.       Fall Risk Fall Risk  04/04/2016 02/11/2016 09/14/2015 03/31/2015 02/09/2015  Falls in the past year? No No No Yes No  Number falls in past yr: - - - 1 -  Injury with Fall? - - - No -  Follow up - - - Education provided;Falls prevention discussed -   Depression Screen PHQ 2/9 Scores 04/04/2016 02/11/2016 09/14/2015 03/31/2015  PHQ - 2 Score 1 0 0 0     Cognitive Function MMSE - Mini Mental State Exam  03/31/2015  Orientation to time 5  Orientation to Place 5  Registration 3  Attention/ Calculation 5  Recall 3  Language- name 2 objects 2  Language- repeat 1  Language- follow 3 step command 3  Language- read & follow direction 1  Write a sentence 1  Copy design 0  Total score 29   Ad8 score reviewed for issues:  Issues making decisions: No  Less interest in hobbies / activities: No  Repeats questions, stories (family complaining): No  Trouble using ordinary gadgets (  microwave, computer, phone): No  Forgets the month or year: No  Mismanaging finances: No   Remembering appts: No  Daily problems with thinking and/or memory: No Ad8 score is= 0       Immunization History  Administered Date(s) Administered  . Influenza Split 01/03/2012  . Influenza Whole 01/07/2007, 11/27/2007, 03/26/2008, 11/12/2008, 11/04/2009  . Influenza,inj,Quad PF,36+ Mos 01/28/2014  . Influenza-Unspecified 12/23/2014, 11/27/2015  . Pneumococcal Conjugate-13 02/09/2015  . Pneumococcal Polysaccharide-23 01/02/2011  . Tdap 01/02/2011  . Zoster 02/07/2007   Screening Tests Health Maintenance  Topic Date Due  . MAMMOGRAM  12/14/2016  . COLONOSCOPY  01/04/2020  . TETANUS/TDAP  01/01/2021  . INFLUENZA VACCINE  Addressed  . DEXA SCAN  Completed  . ZOSTAVAX  Completed  . Hepatitis C Screening  Completed  . PNA vac Low Risk Adult  Completed      Plan:    Follow-up w/ PCP as scheduled. Bring a copy of your advance directives to your next office visit.  During the course of the visit the patient was educated and counseled about the following appropriate screening and preventive services:   Vaccines to include Pneumoccal, Influenza, Hepatitis B, Td, Zostavax, HCV  Cardiovascular Disease  Colorectal cancer screening  Bone density screening  Diabetes screening  Glaucoma screening  Mammography/PAP  Nutrition counseling   Patient Instructions (the written plan) was given to the  patient.   Dorrene German, RN  04/04/2016  [name], MD

## 2016-04-04 NOTE — Patient Instructions (Addendum)
Bring a copy of your advance directives to your next office visit.  Please check your blood pressure at home daily. If it is consistently > 140/90, please call the office to schedule a follow-up with Dr. Larose Kells.  Ms. Shagena , Thank you for taking time to come for your Medicare Wellness Visit. I appreciate your ongoing commitment to your health goals. Please review the following plan we discussed and let me know if I can assist you in the future.   These are the goals we discussed: Goals    . Healthy Lifestyle (pt-stated)          Continue to eat heart healthy diet (full of fruits, vegetables, whole grains, lean protein, water--limit salt, fat, and sugar intake) and increase physical activity as tolerated. Continue doing brain stimulating activities (puzzles, reading, adult coloring books, staying active) to keep memory sharp.        This is a list of the screening recommended for you and due dates:  Health Maintenance  Topic Date Due  . Mammogram  12/14/2016  . Colon Cancer Screening  01/04/2020  . Tetanus Vaccine  01/01/2021  . Flu Shot  Addressed  . DEXA scan (bone density measurement)  Completed  . Shingles Vaccine  Completed  .  Hepatitis C: One time screening is recommended by Center for Disease Control  (CDC) for  adults born from [birth year] through 1965.   Completed  . Pneumonia vaccines  Completed

## 2016-04-04 NOTE — Assessment & Plan Note (Addendum)
BP elevated today, pt is asymptomatic. She reports compliance w/ amlodipine. She does endorse being under increased stress lately. Discussed w/ Dr. Larose Kells; pt to check BP at home daily and keep log. She will call for f/u appt if consistently >140/90.

## 2016-04-04 NOTE — Progress Notes (Signed)
Pre visit review using our clinic review tool, if applicable. No additional management support is needed unless otherwise documented below in the visit note. 

## 2016-06-05 ENCOUNTER — Encounter: Payer: Self-pay | Admitting: Internal Medicine

## 2016-06-06 MED ORDER — LEVOTHYROXINE SODIUM 50 MCG PO TABS: 50.0000 ug | ORAL_TABLET | Freq: Every day | ORAL | 1 refills | Status: DC

## 2016-07-03 DIAGNOSIS — R69 Illness, unspecified: Secondary | ICD-10-CM | POA: Diagnosis not present

## 2016-07-24 DIAGNOSIS — H5213 Myopia, bilateral: Secondary | ICD-10-CM | POA: Diagnosis not present

## 2016-07-24 DIAGNOSIS — H40023 Open angle with borderline findings, high risk, bilateral: Secondary | ICD-10-CM | POA: Diagnosis not present

## 2016-07-24 DIAGNOSIS — H2513 Age-related nuclear cataract, bilateral: Secondary | ICD-10-CM | POA: Diagnosis not present

## 2016-07-24 DIAGNOSIS — R69 Illness, unspecified: Secondary | ICD-10-CM | POA: Diagnosis not present

## 2016-07-24 DIAGNOSIS — H43811 Vitreous degeneration, right eye: Secondary | ICD-10-CM | POA: Diagnosis not present

## 2016-07-24 DIAGNOSIS — H52223 Regular astigmatism, bilateral: Secondary | ICD-10-CM | POA: Diagnosis not present

## 2016-07-24 DIAGNOSIS — H524 Presbyopia: Secondary | ICD-10-CM | POA: Diagnosis not present

## 2016-08-02 DIAGNOSIS — L821 Other seborrheic keratosis: Secondary | ICD-10-CM | POA: Diagnosis not present

## 2016-08-02 DIAGNOSIS — D1724 Benign lipomatous neoplasm of skin and subcutaneous tissue of left leg: Secondary | ICD-10-CM | POA: Diagnosis not present

## 2016-08-02 DIAGNOSIS — L738 Other specified follicular disorders: Secondary | ICD-10-CM | POA: Diagnosis not present

## 2016-08-02 DIAGNOSIS — Z808 Family history of malignant neoplasm of other organs or systems: Secondary | ICD-10-CM | POA: Diagnosis not present

## 2016-08-02 DIAGNOSIS — Z85828 Personal history of other malignant neoplasm of skin: Secondary | ICD-10-CM | POA: Diagnosis not present

## 2016-08-02 DIAGNOSIS — D2362 Other benign neoplasm of skin of left upper limb, including shoulder: Secondary | ICD-10-CM | POA: Diagnosis not present

## 2016-08-02 DIAGNOSIS — D225 Melanocytic nevi of trunk: Secondary | ICD-10-CM | POA: Diagnosis not present

## 2016-08-02 DIAGNOSIS — D2261 Melanocytic nevi of right upper limb, including shoulder: Secondary | ICD-10-CM | POA: Diagnosis not present

## 2016-09-18 ENCOUNTER — Encounter: Payer: Self-pay | Admitting: Internal Medicine

## 2016-09-29 ENCOUNTER — Other Ambulatory Visit: Payer: Self-pay | Admitting: Internal Medicine

## 2016-10-11 ENCOUNTER — Encounter: Payer: Self-pay | Admitting: Internal Medicine

## 2016-10-11 ENCOUNTER — Ambulatory Visit (INDEPENDENT_AMBULATORY_CARE_PROVIDER_SITE_OTHER): Payer: Medicare HMO | Admitting: Internal Medicine

## 2016-10-11 VITALS — BP 132/66 | HR 66 | Temp 97.3°F | Resp 14 | Ht 61.0 in | Wt 113.5 lb

## 2016-10-11 DIAGNOSIS — E039 Hypothyroidism, unspecified: Secondary | ICD-10-CM

## 2016-10-11 DIAGNOSIS — M1991 Primary osteoarthritis, unspecified site: Secondary | ICD-10-CM | POA: Diagnosis not present

## 2016-10-11 DIAGNOSIS — M810 Age-related osteoporosis without current pathological fracture: Secondary | ICD-10-CM

## 2016-10-11 LAB — TSH: TSH: 0.79 u[IU]/mL (ref 0.35–4.50)

## 2016-10-11 NOTE — Progress Notes (Signed)
Pre visit review using our clinic review tool, if applicable. No additional management support is needed unless otherwise documented below in the visit note. 

## 2016-10-11 NOTE — Patient Instructions (Signed)
GO TO THE LAB : Get the blood work      Next visit in December for a physical exam

## 2016-10-11 NOTE — Progress Notes (Signed)
Subjective:    Patient ID: [name], female    DOB: [birth year], 71 y.o.   MRN: [id number]  DOS:  10/11/2016 Type of visit - description : rov Interval history: In general feeling well, she remains active. Several years history of on and off left knee pain, slightly worse over the last 6-12 months. Experiences pain right after she is start walking after sitting down, pain decreases as she gets more active. Denies swelling, redness, fever or chills. HTN: Good compliance of medication, ambulatory BPs are in the 120s. Hypothyroidism: Good compliance of medication   Review of Systems   Past Medical History:  Diagnosis Date  . Hyperlipidemia   . Hypertension   . Hypothyroidism   . 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 Osteoporosis   . SCC (squamous cell carcinoma)     Past Surgical History:  Procedure Laterality Date  . ABDOMINAL HYSTERECTOMY     no oophorectomy  . BREAST LUMPECTOMY     x2 left breast  . R leg liposarcoma  1973  . SQUAMOUS CELL CARCINOMA EXCISION     RLE  . TONSILLECTOMY      Social History   Social History  . Marital status: Married    Spouse name: N/A  . Number of children: 2  . Years of education: N/A   Occupational History  . retired -- Art therapist, class Research scientist (medical)     Social History Main Topics  . Smoking status: Never Smoker  . Smokeless tobacco: Never Used  . Alcohol use No  . Drug use: No  . Sexual activity: No   Other Topics Concern  . Not on file   Social History Narrative   Lost a son when he was 27 y/o   Lives w/ husband       Allergies as of 10/11/2016      Reactions   Moxifloxacin Palpitations      Medication List       Accurate as of 10/11/16 11:59 PM. Always use your most recent med list.          amLODipine 5 MG tablet Commonly known as:  NORVASC Take 1 tablet (5 mg total) by mouth daily.   aspirin 81 MG EC tablet Take 81 mg by mouth daily.   atorvastatin 20 MG tablet Commonly known as:   LIPITOR Take 1 tablet (20 mg total) by mouth daily.   calcium citrate-vitamin D 315-200 MG-UNIT tablet Commonly known as:  CITRACAL+D Take 1 tablet by mouth. 600 mg bid   cholecalciferol 1000 units tablet Commonly known as:  VITAMIN D Take 1,000 Units by mouth daily.   Fish Oil 1200 MG Caps Take 1 capsule by mouth 2 (two) times daily.   ibuprofen 200 MG tablet Commonly known as:  ADVIL,MOTRIN Take 200 mg by mouth at bedtime.   levothyroxine 50 MCG tablet Commonly known as:  SYNTHROID, LEVOTHROID Take 1 tablet (50 mcg total) by mouth daily before breakfast.   multivitamin tablet Take 1 tablet by mouth daily.   RESTASIS 0.05 % ophthalmic emulsion Generic drug:  cycloSPORINE 1 drop 2 (two) times daily.   vitamin E 400 UNIT capsule Take 400 Units by mouth daily.          Objective:   Physical Exam BP 132/66 (BP Location: Right Arm, Patient Position: Sitting, Cuff Size: Small)   Pulse 66   Temp (!) 97.3 F (36.3 C) (Oral)   Resp 14   Ht 5\' 1"  (1.549  m)   Wt 113 lb 8 oz (51.5 kg)   SpO2 98%   BMI 21.45 kg/m  General:   Well developed, well nourished . NAD.  HEENT:  Normocephalic . Face symmetric, atraumatic Lungs:  CTA B Normal respiratory effort, no intercostal retractions, no accessory muscle use. Heart: RRR,  no murmur.  No pretibial edema bilaterally  MSK: knees symmetric, no effusion, minimal bony changes consistent with DJD. Left knee is full range of motion. Skin: Not pale. Not jaundice Neurologic:  alert & oriented X3.  Speech normal, gait appropriate for age and unassisted Psych--  Cognition and judgment appear intact.  Cooperative with normal attention span and concentration.  Behavior appropriate. No anxious or depressed appearing.      Assessment & Plan:   Assessment HTN Hyperlipidemia Hypothyroidism Osteoporosis --T score 2013: -2.6,  T score 02-2014  -2.5 after  Reclast x 3. Last reclast 05-2013. T score 02-2016: -2.4, next DEXA ~  2019/2020 --Normal Vit D 2014 SCC, skin cancer, right lower extremity, sees derm q 6 months Liposarcoma right leg 1972  PLAN: Hypothyroidism: Good compliance with central, check a TSH Osteoporosis: Last bone density test satisfactory.  DJD: Left knee pain as described above most likely DJD. Exam is benign. Recommend stretching, ice/Tylenol, knee sleeves. Call if symptoms increase but currently they are mild and do not affect lifestyle. Primary care: Interested in shingrex, will get when available. Recommend a flu shot in September. RTC 02/2017, CPX

## 2016-10-12 DIAGNOSIS — M1990 Unspecified osteoarthritis, unspecified site: Secondary | ICD-10-CM | POA: Insufficient documentation

## 2016-10-12 NOTE — Assessment & Plan Note (Signed)
Hypothyroidism: Good compliance with central, check a TSH Osteoporosis: Last bone density test satisfactory.  DJD: Left knee pain as described above most likely DJD. Exam is benign. Recommend stretching, ice/Tylenol, knee sleeves. Call if symptoms increase but currently they are mild and do not affect lifestyle. Primary care: Interested in shingrex, will get when available. Recommend a flu shot in September. RTC 02/2017, CPX

## 2016-12-05 ENCOUNTER — Other Ambulatory Visit: Payer: Self-pay | Admitting: Internal Medicine

## 2016-12-09 DIAGNOSIS — R69 Illness, unspecified: Secondary | ICD-10-CM | POA: Diagnosis not present

## 2016-12-12 ENCOUNTER — Encounter: Payer: Self-pay | Admitting: Internal Medicine

## 2016-12-19 DIAGNOSIS — Z1231 Encounter for screening mammogram for malignant neoplasm of breast: Secondary | ICD-10-CM | POA: Diagnosis not present

## 2016-12-19 LAB — HM MAMMOGRAPHY

## 2016-12-25 ENCOUNTER — Encounter: Payer: Self-pay | Admitting: Internal Medicine

## 2016-12-25 DIAGNOSIS — R69 Illness, unspecified: Secondary | ICD-10-CM | POA: Diagnosis not present

## 2016-12-26 DIAGNOSIS — R69 Illness, unspecified: Secondary | ICD-10-CM | POA: Diagnosis not present

## 2017-01-29 DIAGNOSIS — H43811 Vitreous degeneration, right eye: Secondary | ICD-10-CM | POA: Diagnosis not present

## 2017-01-29 DIAGNOSIS — H2513 Age-related nuclear cataract, bilateral: Secondary | ICD-10-CM | POA: Diagnosis not present

## 2017-01-29 DIAGNOSIS — H40023 Open angle with borderline findings, high risk, bilateral: Secondary | ICD-10-CM | POA: Diagnosis not present

## 2017-02-04 ENCOUNTER — Encounter: Payer: Self-pay | Admitting: Internal Medicine

## 2017-02-14 ENCOUNTER — Encounter: Payer: Self-pay | Admitting: Internal Medicine

## 2017-02-14 ENCOUNTER — Ambulatory Visit (INDEPENDENT_AMBULATORY_CARE_PROVIDER_SITE_OTHER): Payer: Medicare HMO | Admitting: Internal Medicine

## 2017-02-14 VITALS — BP 124/66 | HR 69 | Temp 98.1°F | Resp 14 | Ht 61.0 in | Wt 112.1 lb

## 2017-02-14 DIAGNOSIS — Z0000 Encounter for general adult medical examination without abnormal findings: Secondary | ICD-10-CM

## 2017-02-14 LAB — COMPREHENSIVE METABOLIC PANEL
ALT: 16 U/L (ref 0–35)
AST: 20 U/L (ref 0–37)
Albumin: 4.5 g/dL (ref 3.5–5.2)
Alkaline Phosphatase: 54 U/L (ref 39–117)
BILIRUBIN TOTAL: 0.8 mg/dL (ref 0.2–1.2)
BUN: 13 mg/dL (ref 6–23)
CO2: 30 meq/L (ref 19–32)
Calcium: 9.6 mg/dL (ref 8.4–10.5)
Chloride: 101 mEq/L (ref 96–112)
Creatinine, Ser: 0.8 mg/dL (ref 0.40–1.20)
GFR: 75.05 mL/min (ref >60.00)
GLUCOSE: 79 mg/dL (ref 70–99)
POTASSIUM: 4.6 meq/L (ref 3.5–5.1)
Sodium: 141 mEq/L (ref 135–145)
Total Protein: 7.2 g/dL (ref 6.0–8.3)

## 2017-02-14 LAB — TSH: TSH: 1.22 u[IU]/mL (ref 0.35–4.50)

## 2017-02-14 LAB — LIPID PANEL
CHOL/HDL RATIO: 3
Cholesterol: 164 mg/dL (ref 0–200)
HDL: 64.9 mg/dL (ref >39.00)
LDL Cholesterol: 86 mg/dL (ref 0–99)
NONHDL: 98.75
Triglycerides: 64 mg/dL (ref 0.0–149.0)
VLDL: 12.8 mg/dL (ref 0.0–40.0)

## 2017-02-14 NOTE — Progress Notes (Signed)
Subjective:    Patient ID: [name], female    DOB: 01-17-1946, 71 y.o.   MRN: [id number]  DOS:  02/14/2017 Type of visit - description : cpx Interval history: No major concerns, she remains active.  Good medication compliance.  No ambulatory BPs.  Review of Systems  A 14 point review of systems is negative    Past Medical History:  Diagnosis Date  . Hyperlipidemia   . Hypertension   . Hypothyroidism   . 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 Osteoporosis   . SCC (squamous cell carcinoma)     Past Surgical History:  Procedure Laterality Date  . ABDOMINAL HYSTERECTOMY     no oophorectomy  . BREAST LUMPECTOMY     x2 left breast  . R leg liposarcoma  1973  . SQUAMOUS CELL CARCINOMA EXCISION     RLE  . TONSILLECTOMY      Social History   Socioeconomic History  . Marital status: Married    Spouse name: Not on file  . Number of children: 2  . Years of education: Not on file  . Highest education level: Not on file  Social Needs  . Financial resource strain: Not on file  . Food insecurity - worry: Not on file  . Food insecurity - inability: Not on file  . Transportation needs - medical: Not on file  . Transportation needs - non-medical: Not on file  Occupational History  . Occupation: retired -- Art therapist, class Research scientist (medical)   Tobacco Use  . Smoking status: Never Smoker  . Smokeless tobacco: Never Used  Substance and Sexual Activity  . Alcohol use: No  . Drug use: No  . Sexual activity: No    Birth control/protection: Post-menopausal  Other Topics Concern  . Not on file  Social History Narrative   Lost a son when he was 51 y/o   Lives w/ husband      Family History  Problem Relation Age of Onset  . Heart disease Mother        congenital heart defect  . Cancer Father        kidney cancer  . Cancer Brother        Brain Cancer  . Cancer Maternal Aunt        breast cancer  . Cancer Paternal Aunt        breast cancer  . Colon cancer Neg Hx     . Diabetes Neg Hx      Allergies as of 02/14/2017      Reactions   Moxifloxacin Palpitations      Medication List        Accurate as of 02/14/17  5:19 PM. Always use your most recent med list.          amLODipine 5 MG tablet Commonly known as:  NORVASC Take 1 tablet (5 mg total) by mouth daily.   aspirin 81 MG EC tablet Take 81 mg by mouth daily.   atorvastatin 20 MG tablet Commonly known as:  LIPITOR Take 1 tablet (20 mg total) by mouth daily.   calcium citrate-vitamin D 315-200 MG-UNIT tablet Commonly known as:  CITRACAL+D Take 1 tablet by mouth. 600 mg bid   cholecalciferol 1000 units tablet Commonly known as:  VITAMIN D Take 1,000 Units by mouth daily.   Fish Oil 1200 MG Caps Take 1 capsule by mouth 2 (two) times daily.   ibuprofen 200 MG tablet Commonly known as:  ADVIL,MOTRIN  Take 200 mg by mouth at bedtime.   levothyroxine 50 MCG tablet Commonly known as:  SYNTHROID, LEVOTHROID Take 1 tablet (50 mcg total) by mouth daily before breakfast.   multivitamin tablet Take 1 tablet by mouth daily.   RESTASIS 0.05 % ophthalmic emulsion Generic drug:  cycloSPORINE 1 drop 2 (two) times daily.   vitamin E 400 UNIT capsule Take 400 Units by mouth daily.          Objective:   Physical Exam BP 124/66 (BP Location: Left Arm, Patient Position: Sitting, Cuff Size: Small)   Pulse 69   Temp 98.1 F (36.7 C) (Oral)   Resp 14   Ht 5\' 1"  (1.549 m)   Wt 112 lb 2 oz (50.9 kg)   SpO2 97%   BMI 21.19 kg/m  General:   Well developed, well nourished . NAD.  Neck: No  thyromegaly  HEENT:  Normocephalic . Face symmetric, atraumatic Lungs:  CTA B Normal respiratory effort, no intercostal retractions, no accessory muscle use. Heart: RRR,  no murmur.  No pretibial edema bilaterally  Abdomen:  Not distended, soft, non-tender. No rebound or rigidity.   Skin: Exposed areas without rash. Not pale. Not jaundice Neurologic:  alert & oriented X3.  Speech  normal, gait appropriate for age and unassisted Strength symmetric and appropriate for age.  Psych: Cognition and judgment appear intact.  Cooperative with normal attention span and concentration.  Behavior appropriate. No anxious or depressed appearing.     Assessment & Plan:   Assessment HTN Hyperlipidemia Hypothyroidism Osteoporosis --T score 2013: -2.6,  T score 02-2014  -2.5 after  Reclast x 3. Last reclast 05-2013. T score 02-2016: -2.4, next DEXA ~ 2019/2020 --Normal Vit D 2014 SCC, skin cancer, right lower extremity, sees derm q 6 months Liposarcoma right leg 1972  PLAN: HTN: On amlodipine, no recent ambulatory BPs, BP today is very good, checking labs, recommend ambulatory BPs. Hyperlipidemia: On Lipitor, checking labs Hypothyroidism: Continue Synthroid, checking labs Osteoporosis: Due for a DEXA next year, she is active, on calcium and vitamin D. RTC 1 year, CPX.  Sooner if needed

## 2017-02-14 NOTE — Assessment & Plan Note (Signed)
-   Td 2012;  Pneumonia shot 2012, Prevnar 02-2015 ; Zostavax 2008; had a flu shot -CCS: 2011 colonoscopy normal Dr. Oletta Lamas -Female care:  No further PAPs MMG 12-2016 wnl  - Doing very well with lifestyle.

## 2017-02-14 NOTE — Assessment & Plan Note (Signed)
HTN: On amlodipine, no recent ambulatory BPs, BP today is very good, checking labs, recommend ambulatory BPs. Hyperlipidemia: On Lipitor, checking labs Hypothyroidism: Continue Synthroid, checking labs Osteoporosis: Due for a DEXA next year, she is active, on calcium and vitamin D. RTC 1 year, CPX.  Sooner if needed

## 2017-02-14 NOTE — Progress Notes (Signed)
Pre visit review using our clinic review tool, if applicable. No additional management support is needed unless otherwise documented below in the visit note. 

## 2017-02-14 NOTE — Patient Instructions (Signed)
GO TO THE LAB : Get the blood work     GO TO THE FRONT DESK Schedule your next appointment for a physical exam in 1 year   Check the  blood pressure 2 or 3 times a month   Be sure your blood pressure is between 110/65 and  135/85. If it is consistently higher or lower, let me know    

## 2017-04-02 ENCOUNTER — Other Ambulatory Visit: Payer: Self-pay | Admitting: Internal Medicine

## 2017-04-02 MED ORDER — ATORVASTATIN CALCIUM 20 MG PO TABS: 20.0000 mg | ORAL_TABLET | Freq: Every day | ORAL | 2 refills | Status: DC

## 2017-04-02 MED ORDER — AMLODIPINE BESYLATE 5 MG PO TABS: 5.0000 mg | ORAL_TABLET | Freq: Every day | ORAL | 2 refills | Status: DC

## 2017-04-02 NOTE — Progress Notes (Addendum)
Subjective:   [name] is a 72 y.o. female who presents for Medicare Annual (Subsequent) preventive examination. The Patient was informed that the wellness visit is to identify future health risk and educate and initiate measures that can reduce risk for increased disease through the lifespan.    Review of Systems:  No ROS.  Medicare Wellness Visit. Additional risk factors are reflected in the social history.  Cardiac Risk Factors include: advanced age (>87men, >33 women);dyslipidemia;hypertension Sleep patterns:   Wakes once to urinate. Sleeps 7-8 hrs/ night.  Home Safety/Smoke Alarms: Feels safe in home. Smoke alarms in place.  Living environment; residence and Firearm Safety: Lives with husband. No stairs. Seat Belt Safety/Bike Helmet: Wears seat belt. Dentist- Dr Sung Amabile 2x/ yr  Female:   Pap- hysterectomy. No longer doing routine screening due to age.     Mammo- last 12/19/16: normal        Dexa scan- last 02/14/16: low bone mass       CCS- last 01/03/10: reported as normal per pt     Objective:     Vitals: BP 134/64 (BP Location: Left Arm, Patient Position: Sitting)   Pulse 71   Ht 5\' 1"  (1.549 m)   Wt 119 lb (54 kg)   SpO2 97%   BMI 22.48 kg/m   Body mass index is 22.48 kg/m.  Advanced Directives 04/06/2017 04/04/2016 03/31/2015  Does Patient Have a Medical Advance Directive? Yes Yes Yes  Type of Advance Directive Living will [place];Living will [place];Living will;Out of facility DNR (pink MOST or yellow form)  Does patient want to make changes to medical advance directive? - - No - Patient declined  Copy of [place] in Chart? No - copy requested No - copy requested No - copy requested    Tobacco Social History   Tobacco Use  Smoking Status Never Smoker  Smokeless Tobacco Never Used     Counseling given: Not Answered   Clinical Intake: Pain : No/denies pain   Past Medical History:    Diagnosis Date  . Hyperlipidemia   . Hypertension   . Hypothyroidism   . Liposarcoma ([place]) 1972   r leg  . Osteoporosis   . SCC (squamous cell carcinoma)    Past Surgical History:  Procedure Laterality Date  . ABDOMINAL HYSTERECTOMY     no oophorectomy  . BREAST LUMPECTOMY     x2 left breast  . R leg liposarcoma  1973  . SQUAMOUS CELL CARCINOMA EXCISION     RLE  . TONSILLECTOMY     Family History  Problem Relation Age of Onset  . Heart disease Mother        congenital heart defect  . Cancer Father        kidney cancer  . Cancer Brother        Brain Cancer  . Cancer Maternal Aunt        breast cancer  . Cancer Paternal Aunt        breast cancer  . Colon cancer Neg Hx   . Diabetes Neg Hx    Social History   Socioeconomic History  . Marital status: Married    Spouse name: None  . Number of children: 2  . Years of education: None  . Highest education level: None  Social Needs  . Financial resource strain: None  . Food insecurity - worry: None  . Food insecurity - inability: None  . Transportation needs -  medical: None  . Transportation needs - non-medical: None  Occupational History  . Occupation: retired -- Art therapist, class Research scientist (medical)   Tobacco Use  . Smoking status: Never Smoker  . Smokeless tobacco: Never Used  Substance and Sexual Activity  . Alcohol use: No  . Drug use: No  . Sexual activity: No    Birth control/protection: Post-menopausal  Other Topics Concern  . None  Social History Narrative   Lost a son when he was 58 y/o   Lives w/ husband     Outpatient Encounter Medications as of 04/06/2017  Medication Sig  . amLODipine (NORVASC) 5 MG tablet Take 1 tablet (5 mg total) by mouth daily.  Marland Kitchen aspirin 81 MG EC tablet Take 81 mg by mouth daily.    Marland Kitchen atorvastatin (LIPITOR) 20 MG tablet Take 1 tablet (20 mg total) by mouth daily.  . calcium citrate-vitamin D (CITRACAL+D) 315-200 MG-UNIT per tablet Take 1 tablet by mouth. 600 mg bid  .  cholecalciferol (VITAMIN D) 1000 units tablet Take 1,000 Units by mouth daily.  . cycloSPORINE (RESTASIS) 0.05 % ophthalmic emulsion 1 drop 2 (two) times daily.    Marland Kitchen ibuprofen (ADVIL,MOTRIN) 200 MG tablet Take 200 mg by mouth at bedtime.  Marland Kitchen levothyroxine (SYNTHROID, LEVOTHROID) 50 MCG tablet Take 1 tablet (50 mcg total) by mouth daily before breakfast.  . Multiple Vitamin (MULTIVITAMIN) tablet Take 1 tablet by mouth daily.    . Omega-3 Fatty Acids (FISH OIL) 1200 MG CAPS Take 1 capsule by mouth 2 (two) times daily.  . vitamin E 400 UNIT capsule Take 400 Units by mouth daily.  . [DISCONTINUED] amLODipine (NORVASC) 5 MG tablet Take 1 tablet (5 mg total) by mouth daily.  . [DISCONTINUED] atorvastatin (LIPITOR) 20 MG tablet Take 1 tablet (20 mg total) by mouth daily.   No facility-administered encounter medications on file as of 04/06/2017.     Activities of Daily Living In your present state of health, do you have any difficulty performing the following activities: 04/06/2017  Hearing? N  Vision? N  Comment wearing progressive lenses  Difficulty concentrating or making decisions? N  Walking or climbing stairs? N  Dressing or bathing? N  Doing errands, shopping? N  Preparing Food and eating ? N  Using the Toilet? N  In the past six months, have you accidently leaked urine? N  Do you have problems with loss of bowel control? N  Managing your Medications? N  Managing your Finances? N  Housekeeping or managing your Housekeeping? N  Some recent data might be hidden    Patient Care Team: Colon Branch, MD as PCP - General (Internal Medicine) Jari Pigg, MD as Consulting Physician (Dermatology) Shawnie Dapper, DO as Consulting Physician (Optometry) Elsie Saas, MD as Consulting Physician (Orthopedic Surgery) Beshears, Dorie Rank, DMD as Consulting Physician (Dentistry) Lorretta Harp, MD as Consulting Physician (Cardiology)    Assessment:   This is a routine wellness examination for  [name] Physical assessment deferred to PCP.  Exercise Activities and Dietary recommendations Current Exercise Habits: Structured exercise class, Type of exercise: strength training/weights;stretching;walking, Time (Minutes): 60, Frequency (Times/Week): 6, Weekly Exercise (Minutes/Week): 360, Exercise limited by: None identified   Diet (meal preparation, eat out, water intake, caffeinated beverages, dairy products, fruits and vegetables): in general, a "healthy" diet  , well balanced   Goals    . Healthy Lifestyle (pt-stated)     Continue to eat heart healthy diet (full of fruits, vegetables, whole grains, lean protein,  water--limit salt, fat, and sugar intake) and increase physical activity as tolerated. Continue doing brain stimulating activities (puzzles, reading, adult coloring books, staying active) to keep memory sharp.        Fall Risk Fall Risk  04/06/2017 04/04/2016 02/11/2016 09/14/2015 03/31/2015  Falls in the past year? No No No No Yes  Number falls in past yr: - - - - 1  Injury with Fall? - - - - No  Follow up - - - - Education provided;Falls prevention discussed    Depression Screen PHQ 2/9 Scores 04/06/2017 04/04/2016 02/11/2016 09/14/2015  PHQ - 2 Score 0 1 0 0     Cognitive Function Ad8 score reviewed for issues:  Issues making decisions:no  Less interest in hobbies / activities:no  Repeats questions, stories (family complaining):no  Trouble using ordinary gadgets (microwave, computer, phone):no  Forgets the month or year: no  Mismanaging finances: no  Remembering appts:no  Daily problems with thinking and/or memory:no Ad8 score is=0  MMSE - Mini Mental State Exam 03/31/2015  Orientation to time 5  Orientation to Place 5  Registration 3  Attention/ Calculation 5  Recall 3  Language- name 2 objects 2  Language- repeat 1  Language- follow 3 step command 3  Language- read & follow direction 1  Write a sentence 1  Copy design 0  Total score 29         Immunization History  Administered Date(s) Administered  . Influenza Split 01/03/2012  . Influenza Whole 01/07/2007, 11/27/2007, 03/26/2008, 11/12/2008, 11/04/2009  . Influenza,inj,Quad PF,6+ Mos 01/28/2014  . Influenza-Unspecified 12/23/2014, 11/27/2015, 12/09/2016  . Pneumococcal Conjugate-13 02/09/2015  . Pneumococcal Polysaccharide-23 01/02/2011  . Tdap 01/02/2011  . Zoster 02/07/2007    Screening Tests Health Maintenance  Topic Date Due  . MAMMOGRAM  12/19/2017  . COLONOSCOPY  01/04/2020  . TETANUS/TDAP  01/01/2021  . INFLUENZA VACCINE  Completed  . DEXA SCAN  Completed  . Hepatitis C Screening  Completed  . PNA vac Low Risk Adult  Completed       Plan:   Follow up with Dr.Paz as scheduled  Continue to eat heart healthy diet (full of fruits, vegetables, whole grains, lean protein, water--limit salt, fat, and sugar intake) and increase physical activity as tolerated.  Continue doing brain stimulating activities (puzzles, reading, adult coloring books, staying active) to keep memory sharp.     I have personally reviewed and noted the following in the patient's chart:   . Medical and social history . Use of alcohol, tobacco or illicit drugs  . Current medications and supplements . Functional ability and status . Nutritional status . Physical activity . Advanced directives . List of other physicians . Hospitalizations, surgeries, and ER visits in previous 12 months . Vitals . Screenings to include cognitive, depression, and falls . Referrals and appointments  In addition, I have reviewed and discussed with patient certain preventive protocols, quality metrics, and best practice recommendations. A written personalized care plan for preventive services as well as general preventive health recommendations were provided to patient.     Naaman Plummer [place], [place]  04/06/2017  [name], MD

## 2017-04-06 ENCOUNTER — Encounter: Payer: Self-pay | Admitting: *Deleted

## 2017-04-06 ENCOUNTER — Ambulatory Visit (INDEPENDENT_AMBULATORY_CARE_PROVIDER_SITE_OTHER): Payer: Medicare HMO | Admitting: *Deleted

## 2017-04-06 ENCOUNTER — Ambulatory Visit: Payer: Medicare HMO | Admitting: *Deleted

## 2017-04-06 VITALS — BP 134/64 | HR 71 | Ht 61.0 in | Wt 119.0 lb

## 2017-04-06 DIAGNOSIS — Z0000 Encounter for general adult medical examination without abnormal findings: Secondary | ICD-10-CM

## 2017-04-06 NOTE — Patient Instructions (Signed)
Follow up with Dr.Paz as scheduled  Continue to eat heart healthy diet (full of fruits, vegetables, whole grains, lean protein, water--limit salt, fat, and sugar intake) and increase physical activity as tolerated.  Continue doing brain stimulating activities (puzzles, reading, adult coloring books, staying active) to keep memory sharp.    [name] , Thank you for taking time to come for your Medicare Wellness Visit. I appreciate your ongoing commitment to your health goals. Please review the following plan we discussed and let me know if I can assist you in the future.   These are the goals we discussed: Goals    . Healthy Lifestyle (pt-stated)     Continue to eat heart healthy diet (full of fruits, vegetables, whole grains, lean protein, water--limit salt, fat, and sugar intake) and increase physical activity as tolerated. Continue doing brain stimulating activities (puzzles, reading, adult coloring books, staying active) to keep memory sharp.        This is a list of the screening recommended for you and due dates:  Health Maintenance  Topic Date Due  . Mammogram  12/19/2017  . Colon Cancer Screening  01/04/2020  . Tetanus Vaccine  01/01/2021  . Flu Shot  Completed  . DEXA scan (bone density measurement)  Completed  .  Hepatitis C: One time screening is recommended by Center for Disease Control  (CDC) for  adults born from [birth year] through 1965.   Completed  . Pneumonia vaccines  Completed    Health Maintenance for Postmenopausal Women Menopause is a normal process in which your reproductive ability comes to an end. This process happens gradually over a span of months to years, usually between the ages of 42 and 63. Menopause is complete when you have missed 12 consecutive menstrual periods. It is important to talk with your health care provider about some of the most common conditions that affect postmenopausal women, such as heart disease, cancer, and bone loss (osteoporosis).  Adopting a healthy lifestyle and getting preventive care can help to promote your health and wellness. Those actions can also lower your chances of developing some of these common conditions. What should I know about menopause? During menopause, you may experience a number of symptoms, such as:  Moderate-to-severe hot flashes.  Night sweats.  Decrease in sex drive.  Mood swings.  Headaches.  Tiredness.  Irritability.  Memory problems.  Insomnia.  Choosing to treat or not to treat menopausal changes is an individual decision that you make with your health care provider. What should I know about hormone replacement therapy and supplements? Hormone therapy products are effective for treating symptoms that are associated with menopause, such as hot flashes and night sweats. Hormone replacement carries certain risks, especially as you become older. If you are thinking about using estrogen or estrogen with progestin treatments, discuss the benefits and risks with your health care provider. What should I know about heart disease and stroke? Heart disease, heart attack, and stroke become more likely as you age. This may be due, in part, to the hormonal changes that your body experiences during menopause. These can affect how your body processes dietary fats, triglycerides, and cholesterol. Heart attack and stroke are both medical emergencies. There are many things that you can do to help prevent heart disease and stroke:  Have your blood pressure checked at least every 1-2 years. High blood pressure causes heart disease and increases the risk of stroke.  If you are 69-46 years old, ask your health care provider if  you should take aspirin to prevent a heart attack or a stroke.  Do not use any tobacco products, including cigarettes, chewing tobacco, or electronic cigarettes. If you need help quitting, ask your health care provider.  It is important to eat a healthy diet and maintain a  healthy weight. ? Be sure to include plenty of vegetables, fruits, low-fat dairy products, and lean protein. ? Avoid eating foods that are high in solid fats, added sugars, or salt (sodium).  Get regular exercise. This is one of the most important things that you can do for your health. ? Try to exercise for at least 150 minutes each week. The type of exercise that you do should increase your heart rate and make you sweat. This is known as moderate-intensity exercise. ? Try to do strengthening exercises at least twice each week. Do these in addition to the moderate-intensity exercise.  Know your numbers.Ask your health care provider to check your cholesterol and your blood glucose. Continue to have your blood tested as directed by your health care provider.  What should I know about cancer screening? There are several types of cancer. Take the following steps to reduce your risk and to catch any cancer development as early as possible. Breast Cancer  Practice breast self-awareness. ? This means understanding how your breasts normally appear and feel. ? It also means doing regular breast self-exams. Let your health care provider know about any changes, no matter how small.  If you are 11 or older, have a clinician do a breast exam (clinical breast exam or CBE) every year. Depending on your age, family history, and medical history, it may be recommended that you also have a yearly breast X-ray (mammogram).  If you have a family history of breast cancer, talk with your health care provider about genetic screening.  If you are at high risk for breast cancer, talk with your health care provider about having an MRI and a mammogram every year.  Breast cancer (BRCA) gene test is recommended for women who have family members with BRCA-related cancers. Results of the assessment will determine the need for genetic counseling and BRCA1 and for BRCA2 testing. BRCA-related cancers include these  types: ? Breast. This occurs in males or females. ? Ovarian. ? Tubal. This may also be called fallopian tube cancer. ? Cancer of the abdominal or pelvic lining (peritoneal cancer). ? Prostate. ? Pancreatic.  Cervical, Uterine, and Ovarian Cancer Your health care provider may recommend that you be screened regularly for cancer of the pelvic organs. These include your ovaries, uterus, and vagina. This screening involves a pelvic exam, which includes checking for microscopic changes to the surface of your cervix (Pap test).  For women ages 21-65, health care providers may recommend a pelvic exam and a Pap test every three years. For women ages 62-65, they may recommend the Pap test and pelvic exam, combined with testing for human papilloma virus (HPV), every five years. Some types of HPV increase your risk of cervical cancer. Testing for HPV may also be done on women of any age who have unclear Pap test results.  Other health care providers may not recommend any screening for nonpregnant women who are considered low risk for pelvic cancer and have no symptoms. Ask your health care provider if a screening pelvic exam is right for you.  If you have had past treatment for cervical cancer or a condition that could lead to cancer, you need Pap tests and screening for cancer for  at least 20 years after your treatment. If Pap tests have been discontinued for you, your risk factors (such as having a new sexual partner) need to be reassessed to determine if you should start having screenings again. Some women have medical problems that increase the chance of getting cervical cancer. In these cases, your health care provider may recommend that you have screening and Pap tests more often.  If you have a family history of uterine cancer or ovarian cancer, talk with your health care provider about genetic screening.  If you have vaginal bleeding after reaching menopause, tell your health care provider.  There  are currently no reliable tests available to screen for ovarian cancer.  Lung Cancer Lung cancer screening is recommended for adults 67-86 years old who are at high risk for lung cancer because of a history of smoking. A yearly low-dose CT scan of the lungs is recommended if you:  Currently smoke.  Have a history of at least 30 pack-years of smoking and you currently smoke or have quit within the past 15 years. A pack-year is smoking an average of one pack of cigarettes per day for one year.  Yearly screening should:  Continue until it has been 15 years since you quit.  Stop if you develop a health problem that would prevent you from having lung cancer treatment.  Colorectal Cancer  This type of cancer can be detected and can often be prevented.  Routine colorectal cancer screening usually begins at age 12 and continues through age 71.  If you have risk factors for colon cancer, your health care provider may recommend that you be screened at an earlier age.  If you have a family history of colorectal cancer, talk with your health care provider about genetic screening.  Your health care provider may also recommend using home test kits to check for hidden blood in your stool.  A small camera at the end of a tube can be used to examine your colon directly (sigmoidoscopy or colonoscopy). This is done to check for the earliest forms of colorectal cancer.  Direct examination of the colon should be repeated every 5-10 years until age 36. However, if early forms of precancerous polyps or small growths are found or if you have a family history or genetic risk for colorectal cancer, you may need to be screened more often.  Skin Cancer  Check your skin from head to toe regularly.  Monitor any moles. Be sure to tell your health care provider: ? About any new moles or changes in moles, especially if there is a change in a mole's shape or color. ? If you have a mole that is larger than the  size of a pencil eraser.  If any of your family members has a history of skin cancer, especially at a young age, talk with your health care provider about genetic screening.  Always use sunscreen. Apply sunscreen liberally and repeatedly throughout the day.  Whenever you are outside, protect yourself by wearing long sleeves, pants, a wide-brimmed hat, and sunglasses.  What should I know about osteoporosis? Osteoporosis is a condition in which bone destruction happens more quickly than new bone creation. After menopause, you may be at an increased risk for osteoporosis. To help prevent osteoporosis or the bone fractures that can happen because of osteoporosis, the following is recommended:  If you are 43-13 years old, get at least 1,000 mg of calcium and at least 600 mg of vitamin D per day.  If you are older than age 35 but younger than age 22, get at least 1,200 mg of calcium and at least 600 mg of vitamin D per day.  If you are older than age 33, get at least 1,200 mg of calcium and at least 800 mg of vitamin D per day.  Smoking and excessive alcohol intake increase the risk of osteoporosis. Eat foods that are rich in calcium and vitamin D, and do weight-bearing exercises several times each week as directed by your health care provider. What should I know about how menopause affects my mental health? Depression may occur at any age, but it is more common as you become older. Common symptoms of depression include:  Low or sad mood.  Changes in sleep patterns.  Changes in appetite or eating patterns.  Feeling an overall lack of motivation or enjoyment of activities that you previously enjoyed.  Frequent crying spells.  Talk with your health care provider if you think that you are experiencing depression. What should I know about immunizations? It is important that you get and maintain your immunizations. These include:  Tetanus, diphtheria, and pertussis (Tdap) booster  vaccine.  Influenza every year before the flu season begins.  Pneumonia vaccine.  Shingles vaccine.  Your health care provider may also recommend other immunizations. This information is not intended to replace advice given to you by your health care provider. Make sure you discuss any questions you have with your health care provider. Document Released: 04/14/2005 Document Revised: 09/10/2015 Document Reviewed: 11/24/2014 Elsevier Interactive Patient Education  2018 [employer].

## 2017-04-24 ENCOUNTER — Encounter: Payer: Self-pay | Admitting: Internal Medicine

## 2017-04-24 ENCOUNTER — Ambulatory Visit (INDEPENDENT_AMBULATORY_CARE_PROVIDER_SITE_OTHER): Payer: Medicare HMO | Admitting: Internal Medicine

## 2017-04-24 VITALS — BP 118/68 | HR 71 | Temp 98.0°F | Resp 14 | Ht 61.0 in | Wt 117.0 lb

## 2017-04-24 DIAGNOSIS — J069 Acute upper respiratory infection, unspecified: Secondary | ICD-10-CM

## 2017-04-24 MED ORDER — AMOXICILLIN 500 MG PO CAPS: 1000.0000 mg | ORAL_CAPSULE | Freq: Two times a day (BID) | ORAL | 0 refills | Status: DC

## 2017-04-24 MED ORDER — AZELASTINE HCL 0.1 % NA SOLN: 2.0000 | Freq: Every evening | NASAL | 3 refills | Status: DC | PRN

## 2017-04-24 NOTE — Progress Notes (Signed)
Pre visit review using our clinic review tool, if applicable. No additional management support is needed unless otherwise documented below in the visit note. 

## 2017-04-24 NOTE — Progress Notes (Signed)
Subjective:    Patient ID: [name], female    DOB: 1945/09/16, 72 y.o.   MRN: [id number]  DOS:  04/24/2017 Type of visit - description : Acute visit Interval history: Developed a URI 4 weeks ago, was getting slightly better, never recovered 100%. Then 2 weeks ago her g-children visited, they have a cold. Her symptoms then started to resurface: Cough, dry but sometimes intense.  + Nasal yellow  discharge.  Left eye mucous noted in the morning. She was taking Coricidin BP with no much help. (She did receive the shingrex shot a few days ago)  Review of Systems Denies fever chills No nausea, vomiting, diarrhea No major aches and pains.   Past Medical History:  Diagnosis Date  . Hyperlipidemia   . Hypertension   . Hypothyroidism   . 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 Osteoporosis   . SCC (squamous cell carcinoma)     Past Surgical History:  Procedure Laterality Date  . ABDOMINAL HYSTERECTOMY     no oophorectomy  . BREAST LUMPECTOMY     x2 left breast  . R leg liposarcoma  1973  . SQUAMOUS CELL CARCINOMA EXCISION     RLE  . TONSILLECTOMY      Social History   Socioeconomic History  . Marital status: Married    Spouse name: Not on file  . Number of children: 2  . Years of education: Not on file  . Highest education level: Not on file  Social Needs  . Financial resource strain: Not on file  . Food insecurity - worry: Not on file  . Food insecurity - inability: Not on file  . Transportation needs - medical: Not on file  . Transportation needs - non-medical: Not on file  Occupational History  . Occupation: retired -- Art therapist, class Research scientist (medical)   Tobacco Use  . Smoking status: Never Smoker  . Smokeless tobacco: Never Used  Substance and Sexual Activity  . Alcohol use: No  . Drug use: No  . Sexual activity: No    Birth control/protection: Post-menopausal  Other Topics Concern  . Not on file  Social History Narrative   Lost a son when he was 51  y/o   Lives w/ husband       Allergies as of 04/24/2017      Reactions   Moxifloxacin Palpitations      Medication List        Accurate as of 04/24/17  6:36 PM. Always use your most recent med list.          amLODipine 5 MG tablet Commonly known as:  NORVASC Take 1 tablet (5 mg total) by mouth daily.   amoxicillin 500 MG capsule Commonly known as:  AMOXIL Take 2 capsules (1,000 mg total) by mouth 2 (two) times daily.   aspirin 81 MG EC tablet Take 81 mg by mouth daily.   atorvastatin 20 MG tablet Commonly known as:  LIPITOR Take 1 tablet (20 mg total) by mouth daily.   azelastine 0.1 % nasal spray Commonly known as:  ASTELIN Place 2 sprays into both nostrils at bedtime as needed for rhinitis. Use in each nostril as directed   calcium citrate-vitamin D 315-200 MG-UNIT tablet Commonly known as:  CITRACAL+D Take 1 tablet by mouth. 600 mg bid   cholecalciferol 1000 units tablet Commonly known as:  VITAMIN D Take 1,000 Units by mouth daily.   Fish Oil 1200 MG Caps Take 1 capsule by  mouth 2 (two) times daily.   ibuprofen 200 MG tablet Commonly known as:  ADVIL,MOTRIN Take 200 mg by mouth at bedtime.   levothyroxine 50 MCG tablet Commonly known as:  SYNTHROID, LEVOTHROID Take 1 tablet (50 mcg total) by mouth daily before breakfast.   multivitamin tablet Take 1 tablet by mouth daily.   RESTASIS 0.05 % ophthalmic emulsion Generic drug:  cycloSPORINE 1 drop 2 (two) times daily.   vitamin E 400 UNIT capsule Take 400 Units by mouth daily.          Objective:   Physical Exam BP 118/68 (BP Location: Left Arm, Patient Position: Sitting, Cuff Size: Small)   Pulse 71   Temp 98 F (36.7 C) (Oral)   Resp 14   Ht 5\' 1"  (1.549 m)   Wt 117 lb (53.1 kg)   SpO2 98%   BMI 22.11 kg/m  General:   Well developed, well nourished . NAD.  HEENT:  Normocephalic . Face symmetric, atraumatic. Nose congested, sinuses no TTP. TMs poorly visualized due to wax Throat  symmetric, no red Lungs:  CTA B Normal respiratory effort, no intercostal retractions, no accessory muscle use. Heart: RRR,  no murmur.  No pretibial edema bilaterally  Skin: Not pale. Not jaundice Neurologic:  alert & oriented X3.  Speech normal, gait appropriate for age and unassisted Psych--  Cognition and judgment appear intact.  Cooperative with normal attention span and concentration.  Behavior appropriate. No anxious or depressed appearing.      Assessment & Plan:   Assessment HTN Hyperlipidemia Hypothyroidism Osteoporosis --T score 2013: -2.6,  T score 02-2014  -2.5 after  Reclast x 3. Last reclast 05-2013. T score 02-2016: -2.4, next DEXA ~ 2019/2020 --Normal Vit D 2014 SCC, skin cancer, right lower extremity, sees derm q 6 months Liposarcoma right leg 1972  PLAN: URI: Sxs c/w URI, recommend Tylenol, Robitussin-DM, Flonase, Astelin.  If not improving start amoxicillin.  See AVS Also, her husband is my patient, recently diagnosed with prostate cancer.  Counseled.

## 2017-04-24 NOTE — Assessment & Plan Note (Signed)
URI: Sxs c/w URI, recommend Tylenol, Robitussin-DM, Flonase, Astelin.  If not improving start amoxicillin.  See AVS Also, her husband is my patient, recently diagnosed with prostate cancer.  Counseled.

## 2017-04-24 NOTE — Patient Instructions (Signed)
Rest, fluids , tylenol  For cough:  Take Robitussin DM twice a day as needed until better  For nasal congestion: Use OTC  Flonase : 2 nasal sprays on each side of the nose in the morning until you feel better Use ASTELIN a prescribed spray : 2 nasal sprays on each side of the nose at night until you feel better  Avoid decongestants such as  Pseudoephedrine or phenylephrine   Take the antibiotic as prescribed  (Amoxicillin) IF NO BETTER in 3-4 days   Call if not gradually better over the next  10 days  Call anytime if the symptoms are severe

## 2017-06-05 ENCOUNTER — Other Ambulatory Visit: Payer: Self-pay | Admitting: Internal Medicine

## 2017-06-05 ENCOUNTER — Encounter: Payer: Self-pay | Admitting: Internal Medicine

## 2017-06-05 MED ORDER — LEVOTHYROXINE SODIUM 50 MCG PO TABS: 50.0000 ug | ORAL_TABLET | Freq: Every day | ORAL | 2 refills | Status: DC

## 2017-06-27 DIAGNOSIS — R69 Illness, unspecified: Secondary | ICD-10-CM | POA: Diagnosis not present

## 2017-08-14 DIAGNOSIS — D2272 Melanocytic nevi of left lower limb, including hip: Secondary | ICD-10-CM | POA: Diagnosis not present

## 2017-08-14 DIAGNOSIS — D2271 Melanocytic nevi of right lower limb, including hip: Secondary | ICD-10-CM | POA: Diagnosis not present

## 2017-08-14 DIAGNOSIS — D1724 Benign lipomatous neoplasm of skin and subcutaneous tissue of left leg: Secondary | ICD-10-CM | POA: Diagnosis not present

## 2017-08-14 DIAGNOSIS — D2261 Melanocytic nevi of right upper limb, including shoulder: Secondary | ICD-10-CM | POA: Diagnosis not present

## 2017-08-14 DIAGNOSIS — Z85828 Personal history of other malignant neoplasm of skin: Secondary | ICD-10-CM | POA: Diagnosis not present

## 2017-08-14 DIAGNOSIS — D2362 Other benign neoplasm of skin of left upper limb, including shoulder: Secondary | ICD-10-CM | POA: Diagnosis not present

## 2017-08-14 DIAGNOSIS — L570 Actinic keratosis: Secondary | ICD-10-CM | POA: Diagnosis not present

## 2017-08-14 DIAGNOSIS — L700 Acne vulgaris: Secondary | ICD-10-CM | POA: Diagnosis not present

## 2017-08-14 DIAGNOSIS — Z808 Family history of malignant neoplasm of other organs or systems: Secondary | ICD-10-CM | POA: Diagnosis not present

## 2017-08-14 DIAGNOSIS — D225 Melanocytic nevi of trunk: Secondary | ICD-10-CM | POA: Diagnosis not present

## 2017-08-20 DIAGNOSIS — H43811 Vitreous degeneration, right eye: Secondary | ICD-10-CM | POA: Diagnosis not present

## 2017-08-20 DIAGNOSIS — H2513 Age-related nuclear cataract, bilateral: Secondary | ICD-10-CM | POA: Diagnosis not present

## 2017-08-20 DIAGNOSIS — H40023 Open angle with borderline findings, high risk, bilateral: Secondary | ICD-10-CM | POA: Diagnosis not present

## 2017-08-20 DIAGNOSIS — H5213 Myopia, bilateral: Secondary | ICD-10-CM | POA: Diagnosis not present

## 2017-08-20 DIAGNOSIS — H02831 Dermatochalasis of right upper eyelid: Secondary | ICD-10-CM | POA: Diagnosis not present

## 2017-08-25 ENCOUNTER — Encounter: Payer: Self-pay | Admitting: Internal Medicine

## 2017-12-03 DIAGNOSIS — R69 Illness, unspecified: Secondary | ICD-10-CM | POA: Diagnosis not present

## 2017-12-21 DIAGNOSIS — Z1231 Encounter for screening mammogram for malignant neoplasm of breast: Secondary | ICD-10-CM | POA: Diagnosis not present

## 2017-12-21 LAB — HM MAMMOGRAPHY

## 2017-12-25 ENCOUNTER — Telehealth: Payer: Self-pay | Admitting: *Deleted

## 2017-12-25 ENCOUNTER — Telehealth: Payer: Self-pay | Admitting: Internal Medicine

## 2017-12-25 ENCOUNTER — Encounter: Payer: Self-pay | Admitting: Internal Medicine

## 2017-12-25 NOTE — Telephone Encounter (Signed)
Will await written orders from [place].

## 2017-12-25 NOTE — Telephone Encounter (Signed)
Received Physician Orders from [place]; forwarded to provider/SLS 10/22

## 2017-12-25 NOTE — Telephone Encounter (Signed)
Received Mammogram results from [place]; forwarded to provider/SLS 10/22

## 2017-12-25 NOTE — Telephone Encounter (Signed)
Copied from [place] [phone]. Topic: General - Other >> Dec 25, 2017  8:25 AM Keene Breath wrote: Reason for CRM: Patient called to request that Dr. Larose Kells send a referral to [facility] for a second mammography and ultrasound.  Patient stated that they had found an abnormality and requested these tests, but they needed the doctor's order before the tests could be done.  Patient stated that she is scheduled for 12/28/17 at 8:15 for the mammography and 8:45 for the ultrasound.  Please advise.  CB# [phone].

## 2017-12-26 NOTE — Telephone Encounter (Signed)
Orders signed and faxed to [facility] at [phone]. Form sent for scanning.

## 2017-12-28 ENCOUNTER — Encounter: Payer: Self-pay | Admitting: Internal Medicine

## 2017-12-28 DIAGNOSIS — R922 Inconclusive mammogram: Secondary | ICD-10-CM | POA: Diagnosis not present

## 2017-12-28 DIAGNOSIS — N6041 Mammary duct ectasia of right breast: Secondary | ICD-10-CM | POA: Diagnosis not present

## 2017-12-31 ENCOUNTER — Encounter: Payer: Self-pay | Admitting: Internal Medicine

## 2018-01-01 ENCOUNTER — Telehealth: Payer: Self-pay | Admitting: *Deleted

## 2018-01-01 NOTE — Telephone Encounter (Signed)
Received Breast Ultrasound results from [place]; forwarded to provider/SLS 10/29

## 2018-01-02 ENCOUNTER — Other Ambulatory Visit: Payer: Self-pay | Admitting: Internal Medicine

## 2018-02-15 ENCOUNTER — Ambulatory Visit (HOSPITAL_BASED_OUTPATIENT_CLINIC_OR_DEPARTMENT_OTHER)
Admission: RE | Admit: 2018-02-15 | Discharge: 2018-02-15 | Disposition: A | Discharge status: Home / Self Care | Payer: Medicare HMO | Source: Ambulatory Visit | Attending: Internal Medicine | Admitting: Internal Medicine

## 2018-02-15 ENCOUNTER — Ambulatory Visit (INDEPENDENT_AMBULATORY_CARE_PROVIDER_SITE_OTHER): Payer: Medicare HMO | Admitting: Internal Medicine

## 2018-02-15 ENCOUNTER — Encounter: Payer: Self-pay | Admitting: Internal Medicine

## 2018-02-15 VITALS — BP 122/76 | HR 68 | Temp 98.0°F | Resp 16 | Ht 61.0 in | Wt 110.5 lb

## 2018-02-15 DIAGNOSIS — Z0000 Encounter for general adult medical examination without abnormal findings: Secondary | ICD-10-CM | POA: Diagnosis not present

## 2018-02-15 DIAGNOSIS — M810 Age-related osteoporosis without current pathological fracture: Secondary | ICD-10-CM

## 2018-02-15 DIAGNOSIS — E785 Hyperlipidemia, unspecified: Secondary | ICD-10-CM

## 2018-02-15 DIAGNOSIS — M79604 Pain in right leg: Secondary | ICD-10-CM | POA: Diagnosis not present

## 2018-02-15 DIAGNOSIS — M7989 Other specified soft tissue disorders: Secondary | ICD-10-CM | POA: Diagnosis not present

## 2018-02-15 DIAGNOSIS — E039 Hypothyroidism, unspecified: Secondary | ICD-10-CM

## 2018-02-15 LAB — LIPID PANEL
Cholesterol: 179 mg/dL (ref 0–200)
HDL: 66.2 mg/dL (ref >39.00)
LDL Cholesterol: 104 mg/dL — ABNORMAL HIGH (ref 0–99)
NonHDL: 112.86
Total CHOL/HDL Ratio: 3
Triglycerides: 45 mg/dL (ref 0.0–149.0)
VLDL: 9 mg/dL (ref 0.0–40.0)

## 2018-02-15 LAB — COMPREHENSIVE METABOLIC PANEL
ALT: 15 U/L (ref 0–35)
AST: 18 U/L (ref 0–37)
Albumin: 4.5 g/dL (ref 3.5–5.2)
Alkaline Phosphatase: 49 U/L (ref 39–117)
BILIRUBIN TOTAL: 0.6 mg/dL (ref 0.2–1.2)
BUN: 14 mg/dL (ref 6–23)
CALCIUM: 9.7 mg/dL (ref 8.4–10.5)
CO2: 29 mEq/L (ref 19–32)
CREATININE: 0.72 mg/dL (ref 0.40–1.20)
Chloride: 103 mEq/L (ref 96–112)
GFR: 84.52 mL/min (ref >60.00)
Glucose, Bld: 97 mg/dL (ref 70–99)
Potassium: 5 mEq/L (ref 3.5–5.1)
SODIUM: 142 meq/L (ref 135–145)
Total Protein: 6.6 g/dL (ref 6.0–8.3)

## 2018-02-15 LAB — CBC WITH DIFFERENTIAL/PLATELET
Basophils Absolute: 0 10*3/uL (ref 0.0–0.1)
Basophils Relative: 0.6 % (ref 0.0–3.0)
Eosinophils Absolute: 0.1 10*3/uL (ref 0.0–0.7)
Eosinophils Relative: 0.7 % (ref 0.0–5.0)
HCT: 42.2 % (ref 36.0–46.0)
Hemoglobin: 14.2 g/dL (ref 12.0–15.0)
Lymphocytes Relative: 20.3 % (ref 12.0–46.0)
Lymphs Abs: 1.6 10*3/uL (ref 0.7–4.0)
MCHC: 33.6 g/dL (ref 30.0–36.0)
MCV: 90.5 fl (ref 78.0–100.0)
Monocytes Absolute: 0.5 10*3/uL (ref 0.1–1.0)
Monocytes Relative: 6 % (ref 3.0–12.0)
Neutro Abs: 5.6 10*3/uL (ref 1.4–7.7)
Neutrophils Relative %: 72.4 % (ref 43.0–77.0)
Platelets: 326 10*3/uL (ref 150.0–400.0)
RBC: 4.66 Mil/uL (ref 3.87–5.11)
RDW: 13.3 % (ref 11.5–15.5)
WBC: 7.7 10*3/uL (ref 4.0–10.5)

## 2018-02-15 LAB — TSH: TSH: 1.08 u[IU]/mL (ref 0.35–4.50)

## 2018-02-15 NOTE — Progress Notes (Signed)
Subjective:    Patient ID: [name], female    DOB: 12/26/1945, 72 y.o.   MRN: [id number]  DOS:  02/15/2018 Type of visit - description : cpx Good compliance with medications, she does have some concerns   Review of Systems + Stress, taking care of her husband and other family members. Reports mild discomfort at the external aspect of the right ankle for a couple of months, near to where she had sarcoma years ago.  Some puffiness at the end of the day.  Other than above, a 14 point review of systems is negative    Past Medical History:  Diagnosis Date  . Hyperlipidemia   . Hypertension   . Hypothyroidism   . 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 Osteoporosis   . SCC (squamous cell carcinoma)     Past Surgical History:  Procedure Laterality Date  . ABDOMINAL HYSTERECTOMY     no oophorectomy  . BREAST LUMPECTOMY     x2 left breast  . R leg liposarcoma  1973  . SQUAMOUS CELL CARCINOMA EXCISION     RLE  . TONSILLECTOMY      Social History   Socioeconomic History  . Marital status: Married    Spouse name: Not on file  . Number of children: 2  . Years of education: Not on file  . Highest education level: Not on file  Occupational History  . Occupation: retired -- Art therapist, class Research scientist (medical)   Social Needs  . Financial resource strain: Not on file  . Food insecurity:    Worry: Not on file    Inability: Not on file  . Transportation needs:    Medical: Not on file    Non-medical: Not on file  Tobacco Use  . Smoking status: Never Smoker  . Smokeless tobacco: Never Used  Substance and Sexual Activity  . Alcohol use: No  . Drug use: No  . Sexual activity: Never    Birth control/protection: Post-menopausal  Lifestyle  . Physical activity:    Days per week: Not on file    Minutes per session: Not on file  . Stress: Not on file  Relationships  . Social connections:    Talks on phone: Not on file    Gets together: Not on file    Attends religious  service: Not on file    Active member of club or organization: Not on file    Attends meetings of clubs or organizations: Not on file    Relationship status: Not on file  . Intimate partner violence:    Fear of current or ex partner: Not on file    Emotionally abused: Not on file    Physically abused: Not on file    Forced sexual activity: Not on file  Other Topics Concern  . Not on file  Social History Narrative   Lost a son when he was 32 y/o   Lives w/ husband      Family History  Problem Relation Age of Onset  . Heart disease Mother        congenital heart defect  . Cancer Father        kidney cancer  . Cancer Brother        Brain Cancer  . Cancer Maternal Aunt        breast cancer  . Cancer Paternal Aunt        breast cancer  . Colon cancer Neg Hx   .  Diabetes Neg Hx      Allergies as of 02/15/2018      Reactions   Moxifloxacin Palpitations      Medication List       Accurate as of February 15, 2018  5:14 PM. Always use your most recent med list.        amLODipine 5 MG tablet Commonly known as:  NORVASC Take 1 tablet (5 mg total) by mouth daily.   aspirin 81 MG EC tablet Take 81 mg by mouth daily.   atorvastatin 20 MG tablet Commonly known as:  LIPITOR Take 1 tablet (20 mg total) by mouth daily.   azelastine 0.1 % nasal spray Commonly known as:  ASTELIN Place 2 sprays into both nostrils at bedtime as needed for rhinitis. Use in each nostril as directed   calcium citrate-vitamin D 315-200 MG-UNIT tablet Commonly known as:  CITRACAL+D Take 1 tablet by mouth. 600 mg bid   cholecalciferol 1000 units tablet Commonly known as:  VITAMIN D Take 1,000 Units by mouth daily.   Fish Oil 1200 MG Caps Take 1 capsule by mouth 2 (two) times daily.   ibuprofen 200 MG tablet Commonly known as:  ADVIL,MOTRIN Take 200 mg by mouth at bedtime.   levothyroxine 50 MCG tablet Commonly known as:  SYNTHROID, LEVOTHROID Take 1 tablet (50 mcg total) by mouth daily  before breakfast.   multivitamin tablet Take 1 tablet by mouth daily.   RESTASIS 0.05 % ophthalmic emulsion Generic drug:  cycloSPORINE 1 drop 2 (two) times daily.   vitamin E 400 UNIT capsule Take 400 Units by mouth daily.           Objective:   Physical Exam BP 122/76 (BP Location: Left Arm, Patient Position: Sitting, Cuff Size: Small)   Pulse 68   Temp 98 F (36.7 C) (Oral)   Resp 16   Ht 5\' 1"  (1.549 m)   Wt 110 lb 8 oz (50.1 kg)   SpO2 96%   BMI 20.88 kg/m   General: Well developed, NAD, BMI noted Neck: No  thyromegaly  HEENT:  Normocephalic . Face symmetric, atraumatic Lungs:  CTA B Normal respiratory effort, no intercostal retractions, no accessory muscle use. Heart: RRR,  no murmur.  No pretibial edema bilaterally  Abdomen:  Not distended, soft, non-tender. No rebound or rigidity. MSK: Right leg, distal- lateral from the pretibial area >>> she has a well-healed extensive surgical scar from previous liposarcoma. Ankle itself: No puffiness, no red, slightly decreased range of motion?. Neurologic:  alert & oriented X3.  Speech normal, gait appropriate for age and unassisted Strength symmetric and appropriate for age.  Psych: Cognition and judgment appear intact.  Cooperative with normal attention span and concentration.  Behavior appropriate. No anxious or depressed appearing.     Assessment & Plan:     Assessment HTN Hyperlipidemia Hypothyroidism Osteoporosis --T score 2013: -2.6,  T score 02-2014  -2.5 after  Reclast x 3. Last reclast 05-2013. T score 02-2016: -2.4, next DEXA ~ 2019/2020 --Normal Vit D 2014 SCC, skin cancer, right lower extremity, sees derm q 6 months Liposarcoma right leg 1972 (near ankle, posteriorly)  PLAN:  HTN, hyperlipidemia, hypothyroidism:Continue with amlodipine, Lipitor, Synthroid.  Checking labs. Osteoporosis: Due for DEXA, ordering one. Pain, right ankle, near the vicinity where she had a liposarcoma, exam is  benign, will get x-rays of the area, OTCs and ice as needed, call if not better for a Ortho referral. Stress: Counseling, she takes care of her husband ,  her daughter needs a lot of help as well.  Fortunately she is doing well and she is keeping some time for herself[facility] RTC 1 year.

## 2018-02-15 NOTE — Progress Notes (Signed)
Pre visit review using our clinic review tool, if applicable. No additional management support is needed unless otherwise documented below in the visit note. 

## 2018-02-15 NOTE — Patient Instructions (Addendum)
Please schedule Medicare Wellness with Glenard Haring.    GO TO THE LAB : Get the blood work     GO TO THE FRONT DESK Schedule your next appointment for a  Physical in 1 year    STOP BY THE FIRST FLOOR:  get the XR    Call if the ankle pain is not gradually improving

## 2018-02-15 NOTE — Assessment & Plan Note (Signed)
HTN, hyperlipidemia, hypothyroidism:Continue with amlodipine, Lipitor, Synthroid.  Checking labs. Osteoporosis: Due for DEXA, ordering one. Pain, right ankle, near the vicinity where she had a liposarcoma, exam is benign, will get x-rays of the area, OTCs and ice as needed, call if not better for a Ortho referral. Stress: Counseling, she takes care of her husband , her daughter needs a lot of help as well.  Fortunately she is doing well and she is keeping some time for herself, [facility]. RTC 1 year.

## 2018-02-15 NOTE — Assessment & Plan Note (Addendum)
-   Td 2012;  Pneumonia shot 2012, Prevnar 02-2015 ; Zostavax 2008; s/p shingrix x 2;  Had a  flu shot -CCS: 2011 colonoscopy normal Dr. Oletta Lamas -Female care:  No further PAPs MMG 12-2017 wnl  -Labs: CMP, FLP, CBC, TSH. -Doing well with lifestyle.

## 2018-02-22 DIAGNOSIS — H40023 Open angle with borderline findings, high risk, bilateral: Secondary | ICD-10-CM | POA: Diagnosis not present

## 2018-02-25 DIAGNOSIS — H40023 Open angle with borderline findings, high risk, bilateral: Secondary | ICD-10-CM | POA: Diagnosis not present

## 2018-02-25 DIAGNOSIS — H2513 Age-related nuclear cataract, bilateral: Secondary | ICD-10-CM | POA: Diagnosis not present

## 2018-02-25 DIAGNOSIS — H43811 Vitreous degeneration, right eye: Secondary | ICD-10-CM | POA: Diagnosis not present

## 2018-02-25 DIAGNOSIS — H02831 Dermatochalasis of right upper eyelid: Secondary | ICD-10-CM | POA: Diagnosis not present

## 2018-03-03 ENCOUNTER — Other Ambulatory Visit: Payer: Self-pay | Admitting: Internal Medicine

## 2018-03-27 NOTE — Telephone Encounter (Signed)
Order signed and faxed to [facility] at [phone]. Form sent for scanning.

## 2018-04-02 DIAGNOSIS — Z90710 Acquired absence of both cervix and uterus: Secondary | ICD-10-CM | POA: Diagnosis not present

## 2018-04-02 DIAGNOSIS — Z85831 Personal history of malignant neoplasm of soft tissue: Secondary | ICD-10-CM | POA: Diagnosis not present

## 2018-04-02 DIAGNOSIS — Z8262 Family history of osteoporosis: Secondary | ICD-10-CM | POA: Diagnosis not present

## 2018-04-02 DIAGNOSIS — R634 Abnormal weight loss: Secondary | ICD-10-CM | POA: Diagnosis not present

## 2018-04-02 DIAGNOSIS — M810 Age-related osteoporosis without current pathological fracture: Secondary | ICD-10-CM | POA: Diagnosis not present

## 2018-04-02 LAB — HM DEXA SCAN

## 2018-04-05 NOTE — Progress Notes (Addendum)
Subjective:   [name] is a 73 y.o. female who presents for Medicare Annual (Subsequent) preventive examination.  Review of Systems: No ROS.  Medicare Wellness Visit. Additional risk factors are reflected in the social history. Cardiac Risk Factors include: advanced age (>7men, >16 women);hypertension;dyslipidemia Sleep patterns: no issues Home Safety/Smoke Alarms: Feels safe in home. Smoke alarms in place.  Lives w/ husband. No stairs.   Female:        Mammo- 02/15/18      Dexa scan- pt reports done last week at [facility] and asked them to send results. CCS- per pt last 01/03/10- normal Eye- Dr.Parker with [facility]. Being watched 3x/yr for glaucoma.    Objective:     Vitals: BP 140/78 (BP Location: Left Arm, Patient Position: Sitting, Cuff Size: Normal)   Pulse 67   Ht 5\' 1"  (1.549 m)   Wt 115 lb 12.8 oz (52.5 kg)   SpO2 98%   BMI 21.88 kg/m   Body mass index is 21.88 kg/m.  Advanced Directives 04/08/2018 04/06/2017 04/04/2016 03/31/2015  Does Patient Have a Medical Advance Directive? Yes Yes Yes Yes  Type of Paramedic of [place];Living will Living will [place];Living will [place];Living will;Out of facility DNR (pink MOST or yellow form)  Does patient want to make changes to medical advance directive? No - Patient declined - - No - Patient declined  Copy of [place] in Chart? No - copy requested No - copy requested No - copy requested No - copy requested    Tobacco Social History   Tobacco Use  Smoking Status Never Smoker  Smokeless Tobacco Never Used     Counseling given: Not Answered   Clinical Intake:  Pain : No/denies pain   Past Medical History:  Diagnosis Date  . Hyperlipidemia   . Hypertension   . Hypothyroidism   . 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 Osteoporosis   . SCC (squamous cell carcinoma)    Past Surgical History:  Procedure Laterality Date  . ABDOMINAL  HYSTERECTOMY     no oophorectomy  . BREAST LUMPECTOMY     x2 left breast  . R leg liposarcoma  1973  . SQUAMOUS CELL CARCINOMA EXCISION     RLE  . TONSILLECTOMY     Family History  Problem Relation Age of Onset  . Heart disease Mother        congenital heart defect  . Cancer Father        kidney cancer  . Cancer Brother        Brain Cancer  . Cancer Maternal Aunt        breast cancer  . Cancer Paternal Aunt        breast cancer  . Colon cancer Neg Hx   . Diabetes Neg Hx    Social History   Socioeconomic History  . Marital status: Married    Spouse name: Not on file  . Number of children: 2  . Years of education: Not on file  . Highest education level: Not on file  Occupational History  . Occupation: retired -- Art therapist, class Research scientist (medical)   Social Needs  . Financial resource strain: Not on file  . Food insecurity:    Worry: Not on file    Inability: Not on file  . Transportation needs:    Medical: Not on file    Non-medical: Not on file  Tobacco Use  .  Smoking status: Never Smoker  . Smokeless tobacco: Never Used  Substance and Sexual Activity  . Alcohol use: No  . Drug use: No  . Sexual activity: Never    Birth control/protection: Post-menopausal  Lifestyle  . Physical activity:    Days per week: Not on file    Minutes per session: Not on file  . Stress: Not on file  Relationships  . Social connections:    Talks on phone: Not on file    Gets together: Not on file    Attends religious service: Not on file    Active member of club or organization: Not on file    Attends meetings of clubs or organizations: Not on file    Relationship status: Not on file  Other Topics Concern  . Not on file  Social History Narrative   Lost a son when he was 46 y/o   Lives w/ husband     Outpatient Encounter Medications as of 04/08/2018  Medication Sig  . amLODipine (NORVASC) 5 MG tablet Take 1 tablet (5 mg total) by mouth daily.  Marland Kitchen aspirin 81 MG EC tablet  Take 81 mg by mouth daily.    Marland Kitchen atorvastatin (LIPITOR) 20 MG tablet Take 1 tablet (20 mg total) by mouth daily.  . calcium citrate-vitamin D (CITRACAL+D) 315-200 MG-UNIT per tablet Take 1 tablet by mouth. 600 mg bid  . cholecalciferol (VITAMIN D) 1000 units tablet Take 1,000 Units by mouth daily.  . cycloSPORINE (RESTASIS) 0.05 % ophthalmic emulsion 1 drop 2 (two) times daily.    Marland Kitchen levothyroxine (SYNTHROID, LEVOTHROID) 50 MCG tablet Take 1 tablet (50 mcg total) by mouth daily before breakfast.  . Multiple Vitamin (MULTIVITAMIN) tablet Take 1 tablet by mouth daily.    . Omega-3 Fatty Acids (FISH OIL) 1200 MG CAPS Take 1 capsule by mouth 2 (two) times daily.  . vitamin E 400 UNIT capsule Take 400 Units by mouth daily.  . [DISCONTINUED] azelastine (ASTELIN) 0.1 % nasal spray Place 2 sprays into both nostrils at bedtime as needed for rhinitis. Use in each nostril as directed (Patient not taking: Reported on 02/15/2018)  . [DISCONTINUED] ibuprofen (ADVIL,MOTRIN) 200 MG tablet Take 200 mg by mouth at bedtime.   No facility-administered encounter medications on file as of 04/08/2018.     Activities of Daily Living In your present state of health, do you have any difficulty performing the following activities: 04/08/2018  Hearing? N  Vision? N  Difficulty concentrating or making decisions? N  Walking or climbing stairs? N  Dressing or bathing? N  Doing errands, shopping? N  Preparing Food and eating ? N  Using the Toilet? N  In the past six months, have you accidently leaked urine? N  Do you have problems with loss of bowel control? N  Managing your Medications? N  Managing your Finances? N  Housekeeping or managing your Housekeeping? N  Some recent data might be hidden    Patient Care Team: Colon Branch, MD as PCP - General (Internal Medicine) Jari Pigg, MD as Consulting Physician (Dermatology) Shawnie Dapper, DO as Consulting Physician (Optometry) Elsie Saas, MD as Consulting  Physician (Orthopedic Surgery) Beshears, Dorie Rank, DMD as Consulting Physician (Dentistry) Lorretta Harp, MD as Consulting Physician (Cardiology)    Assessment:   This is a routine wellness examination for [name] Physical assessment deferred to PCP.  Exercise Activities and Dietary recommendations Current Exercise Habits: Home exercise routine, Type of exercise: treadmill;strength training/weights, Time (Minutes): 60, Frequency (Times/Week):  6, Weekly Exercise (Minutes/Week): 360, Intensity: Mild, Exercise limited by: None identified   Diet (meal preparation, eat out, water intake, caffeinated beverages, dairy products, fruits and vegetables): in general, a "healthy" diet  , well balanced Breakfast: veg/fruit smoothie and bagel Lunch: largest meal Dinner:  Skips (snacks occasionally)  Goals    . Healthy Lifestyle (pt-stated)     Continue to eat heart healthy diet (full of fruits, vegetables, whole grains, lean protein, water--limit salt, fat, and sugar intake) and increase physical activity as tolerated. Continue doing brain stimulating activities (puzzles, reading, adult coloring books, staying active) to keep memory sharp.        Fall Risk Fall Risk  04/08/2018 04/06/2017 04/04/2016 02/11/2016 09/14/2015  Falls in the past year? 0 No No No No  Number falls in past yr: - - - - -  Injury with Fall? - - - - -  Follow up - - - - -    Depression Screen PHQ 2/9 Scores 04/08/2018 02/15/2018 04/06/2017 04/04/2016  PHQ - 2 Score 0 2 0 1  PHQ- 9 Score - 5 - -     Cognitive Function Ad8 score reviewed for issues:  Issues making decisions:no  Less interest in hobbies / activities:no  Repeats questions, stories (family complaining):no  Trouble using ordinary gadgets (microwave, computer, phone):no  Forgets the month or year: no  Mismanaging finances: no  Remembering appts:no  Daily problems with thinking and/or memory:no Ad8 score is=0   MMSE - Mini Mental State Exam 03/31/2015    Orientation to time 5  Orientation to Place 5  Registration 3  Attention/ Calculation 5  Recall 3  Language- name 2 objects 2  Language- repeat 1  Language- follow 3 step command 3  Language- read & follow direction 1  Write a sentence 1  Copy design 0  Total score 29        Immunization History  Administered Date(s) Administered  . Influenza Split 01/03/2012, 12/03/2017  . Influenza Whole 01/07/2007, 11/27/2007, 03/26/2008, 11/12/2008, 11/04/2009  . Influenza,inj,Quad PF,6+ Mos 01/28/2014  . Influenza-Unspecified 12/23/2014, 11/27/2015, 12/09/2016  . Pneumococcal Conjugate-13 02/09/2015  . Pneumococcal Polysaccharide-23 01/02/2011  . Tdap 01/02/2011  . Zoster 02/07/2007  . Zoster Recombinat (Shingrix) 04/18/2017, 07/07/2017   Screening Tests Health Maintenance  Topic Date Due  . MAMMOGRAM  12/22/2018  . COLONOSCOPY  01/04/2020  . TETANUS/TDAP  01/01/2021  . INFLUENZA VACCINE  Completed  . DEXA SCAN  Completed  . Hepatitis C Screening  Completed  . PNA vac Low Risk Adult  Completed      Plan:    Please schedule your next medicare wellness visit with me in 1 yr.  Continue to eat healthy  Continue exercising.  Bring a copy of your living will and/or healthcare power of attorney to your next office visit.    I have personally reviewed and noted the following in the patient's chart:   . Medical and social history . Use of alcohol, tobacco or illicit drugs  . Current medications and supplements . Functional ability and status . Nutritional status . Physical activity . Advanced directives . List of other physicians . Hospitalizations, surgeries, and ER visits in previous 12 months . Vitals . Screenings to include cognitive, depression, and falls . Referrals and appointments  In addition, I have reviewed and discussed with patient certain preventive protocols, quality metrics, and best practice recommendations. A written personalized care plan for  preventive services as well as general preventive health recommendations were provided to  patient.     Naaman Plummer [place], [place]  04/08/2018  [name], MD

## 2018-04-08 ENCOUNTER — Encounter: Payer: Self-pay | Admitting: *Deleted

## 2018-04-08 ENCOUNTER — Ambulatory Visit (INDEPENDENT_AMBULATORY_CARE_PROVIDER_SITE_OTHER): Payer: Medicare HMO | Admitting: *Deleted

## 2018-04-08 VITALS — BP 140/78 | HR 67 | Ht 61.0 in | Wt 115.8 lb

## 2018-04-08 DIAGNOSIS — Z0000 Encounter for general adult medical examination without abnormal findings: Secondary | ICD-10-CM

## 2018-04-08 NOTE — Patient Instructions (Signed)
Please schedule your next medicare wellness visit with me in 1 yr.  Continue to eat healthy  Continue exercising.  Bring a copy of your living will and/or healthcare power of attorney to your next office visit.    [name] , Thank you for taking time to come for your Medicare Wellness Visit. I appreciate your ongoing commitment to your health goals. Please review the following plan we discussed and let me know if I can assist you in the future.   These are the goals we discussed: Goals    . Healthy Lifestyle (pt-stated)     Continue to eat heart healthy diet (full of fruits, vegetables, whole grains, lean protein, water--limit salt, fat, and sugar intake) and increase physical activity as tolerated. Continue doing brain stimulating activities (puzzles, reading, adult coloring books, staying active) to keep memory sharp.        This is a list of the screening recommended for you and due dates:  Health Maintenance  Topic Date Due  . Mammogram  12/22/2018  . Colon Cancer Screening  01/04/2020  . Tetanus Vaccine  01/01/2021  . Flu Shot  Completed  . DEXA scan (bone density measurement)  Completed  .  Hepatitis C: One time screening is recommended by Center for Disease Control  (CDC) for  adults born from [birth year] through 1965.   Completed  . Pneumonia vaccines  Completed    Health Maintenance After Age 53 After age 66, you are at a higher risk for certain long-term diseases and infections as well as injuries from falls. Falls are a major cause of broken bones and head injuries in people who are older than age 75. Getting regular preventive care can help to keep you healthy and well. Preventive care includes getting regular testing and making lifestyle changes as recommended by your health care provider. Talk with your health care provider about:  Which screenings and tests you should have. A screening is a test that checks for a disease when you have no symptoms.  A diet and exercise  plan that is right for you. What should I know about screenings and tests to prevent falls? Screening and testing are the best ways to find a health problem early. Early diagnosis and treatment give you the best chance of managing medical conditions that are common after age 37. Certain conditions and lifestyle choices may make you more likely to have a fall. Your health care provider may recommend:  Regular vision checks. Poor vision and conditions such as cataracts can make you more likely to have a fall. If you wear glasses, make sure to get your prescription updated if your vision changes.  Medicine review. Work with your health care provider to regularly review all of the medicines you are taking, including over-the-counter medicines. Ask your health care provider about any side effects that may make you more likely to have a fall. Tell your health care provider if any medicines that you take make you feel dizzy or sleepy.  Osteoporosis screening. Osteoporosis is a condition that causes the bones to get weaker. This can make the bones weak and cause them to break more easily.  Blood pressure screening. Blood pressure changes and medicines to control blood pressure can make you feel dizzy.  Strength and balance checks. Your health care provider may recommend certain tests to check your strength and balance while standing, walking, or changing positions.  Foot health exam. Foot pain and numbness, as well as not wearing proper footwear,  can make you more likely to have a fall.  Depression screening. You may be more likely to have a fall if you have a fear of falling, feel emotionally low, or feel unable to do activities that you used to do.  Alcohol use screening. Using too much alcohol can affect your balance and may make you more likely to have a fall. What actions can I take to lower my risk of falls? General instructions  Talk with your health care provider about your risks for falling.  Tell your health care provider if: ? You fall. Be sure to tell your health care provider about all falls, even ones that seem minor. ? You feel dizzy, sleepy, or off-balance.  Take over-the-counter and prescription medicines only as told by your health care provider. These include any supplements.  Eat a healthy diet and maintain a healthy weight. A healthy diet includes low-fat dairy products, low-fat (lean) meats, and fiber from whole grains, beans, and lots of fruits and vegetables. Home safety  Remove any tripping hazards, such as rugs, cords, and clutter.  Install safety equipment such as grab bars in bathrooms and safety rails on stairs.  Keep rooms and walkways well-lit. Activity   Follow a regular exercise program to stay fit. This will help you maintain your balance. Ask your health care provider what types of exercise are appropriate for you.  If you need a cane or walker, use it as recommended by your health care provider.  Wear supportive shoes that have nonskid soles. Lifestyle  Do not drink alcohol if your health care provider tells you not to drink.  If you drink alcohol, limit how much you have: ? 0-1 drink a day for women. ? 0-2 drinks a day for men.  Be aware of how much alcohol is in your drink. In the [place] one drink equals one typical bottle of beer (12 oz), one-half glass of wine (5 oz), or one shot of hard liquor (1 oz).  Do not use any products that contain nicotine or tobacco, such as cigarettes and e-cigarettes. If you need help quitting, ask your health care provider. Summary  Having a healthy lifestyle and getting preventive care can help to protect your health and wellness after age 64.  Screening and testing are the best way to find a health problem early and help you avoid having a fall. Early diagnosis and treatment give you the best chance for managing medical conditions that are more common for people who are older than age 82.  Falls are a  major cause of broken bones and head injuries in people who are older than age 47. Take precautions to prevent a fall at home.  Work with your health care provider to learn what changes you can make to improve your health and wellness and to prevent falls. This information is not intended to replace advice given to you by your health care provider. Make sure you discuss any questions you have with your health care provider. Document Released: 01/03/2017 Document Revised: 01/03/2017 Document Reviewed: 01/03/2017 Elsevier Interactive Patient Education  2019 [employer].

## 2018-04-11 ENCOUNTER — Telehealth: Payer: Self-pay

## 2018-04-11 NOTE — Telephone Encounter (Signed)
Copied from [place] [id number]. Topic: General - Other >> Apr 11, 2018  4:14 PM Margot Ables wrote: Reason for CRM: Pt states she was advised by Glenard Haring to record BP for a few days and report back. Pt reports as follows. Pt denies any symptoms. She states she has had a lot of stress recently and normally is on the low side with her BP and her low dosage of BP medication. Pt asking if referral to cardiology is needed. Please advise. 2/4 148/70 2/5 154/81 2/6 161/78

## 2018-04-12 NOTE — Addendum Note (Signed)
Addended by: Kelle Darting A on: 04/12/2018 01:42 PM   Modules accepted: Orders

## 2018-04-12 NOTE — Telephone Encounter (Signed)
Assuming her bp machine is correct, I would advise taking 2 of her 5 mg amlodipine tablets. Check bp this weekend. Make appointment for Monday or Tuesday. Write down readings but also bring bp machine in on visit. Will recheck. Discuss her stress.   See pcp if has availablility. If not then I can see her.

## 2018-04-12 NOTE — Telephone Encounter (Addendum)
Notified pt of below and she is agreeable to increase amlodipine until follow up with PCP.  She states spouse was diagnosed with prostate cancer in April, major foot surgery after that. Had complete tear of his retina 6 weeks ago and is worried about his health and trying to take care of him. Pt scheduled appt with Dr Larose Kells on Monday at 2:40pm. She will bring BP readings and machine with her to that appt.

## 2018-04-12 NOTE — Telephone Encounter (Signed)
[name] -- Please advise in PCP's absence?

## 2018-04-15 ENCOUNTER — Encounter: Payer: Self-pay | Admitting: Internal Medicine

## 2018-04-15 ENCOUNTER — Ambulatory Visit (INDEPENDENT_AMBULATORY_CARE_PROVIDER_SITE_OTHER): Payer: Medicare HMO | Admitting: Internal Medicine

## 2018-04-15 VITALS — BP 132/78 | HR 68 | Temp 97.7°F | Resp 18 | Ht 61.0 in | Wt 117.2 lb

## 2018-04-15 DIAGNOSIS — I10 Essential (primary) hypertension: Secondary | ICD-10-CM

## 2018-04-15 DIAGNOSIS — F439 Reaction to severe stress, unspecified: Secondary | ICD-10-CM | POA: Diagnosis not present

## 2018-04-15 DIAGNOSIS — R69 Illness, unspecified: Secondary | ICD-10-CM | POA: Diagnosis not present

## 2018-04-15 MED ORDER — AMLODIPINE BESYLATE 10 MG PO TABS: 10.0000 mg | ORAL_TABLET | Freq: Every day | ORAL | 2 refills | Status: DC

## 2018-04-15 NOTE — Patient Instructions (Signed)
Check the  blood pressure   daily Be sure your blood pressure is between 110/65 and  135/85. If it is consistently higher or lower, let me know   HOW TO TAKE YOUR BLOOD PRESSURE:   Rest 5 minutes before taking your blood pressure.   Don't smoke or drink caffeinated beverages for at least 30 minutes before.   Take your blood pressure before (not after) you eat.   Sit comfortably with your back supported and both feet on the floor (don't cross your legs).   Elevate your arm to heart level on a table or a desk.   Use the proper sized cuff. It should fit smoothly and snugly around your bare upper arm. There should be enough room to slip a fingertip under the cuff. The bottom edge of the cuff should be 1 inch above the crease of the elbow.   Ideally, take 3 measurements at one sitting and record the average.

## 2018-04-15 NOTE — Progress Notes (Signed)
Subjective:    Patient ID: [name], female    DOB: 16-Dec-1945, 73 y.o.   MRN: [id number]  DOS:  04/15/2018 Type of visit - description: Here for BP management The patient call 04/11/2018, BP was noted to be elevated in the 140, 150 and even 160 range. Amlodipine was increased from 5 mg to 10 mg.  She is under a lot of stress. Husband was diagnosed with prostate cancer, also he had foot surgery and a reitna detachment.  Review of Systems Denies chest pain, difficulty breathing. Mild edema lower extremities at baseline No palpitations  Past Medical History:  Diagnosis Date  . Hyperlipidemia   . Hypertension   . Hypothyroidism   . 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 Osteoporosis   . SCC (squamous cell carcinoma)     Past Surgical History:  Procedure Laterality Date  . ABDOMINAL HYSTERECTOMY     no oophorectomy  . BREAST LUMPECTOMY     x2 left breast  . R leg liposarcoma  1973  . SQUAMOUS CELL CARCINOMA EXCISION     RLE  . TONSILLECTOMY      Social History   Socioeconomic History  . Marital status: Married    Spouse name: Not on file  . Number of children: 2  . Years of education: Not on file  . Highest education level: Not on file  Occupational History  . Occupation: retired -- Art therapist, class Research scientist (medical)   Social Needs  . Financial resource strain: Not on file  . Food insecurity:    Worry: Not on file    Inability: Not on file  . Transportation needs:    Medical: Not on file    Non-medical: Not on file  Tobacco Use  . Smoking status: Never Smoker  . Smokeless tobacco: Never Used  Substance and Sexual Activity  . Alcohol use: No  . Drug use: No  . Sexual activity: Never    Birth control/protection: Post-menopausal  Lifestyle  . Physical activity:    Days per week: Not on file    Minutes per session: Not on file  . Stress: Not on file  Relationships  . Social connections:    Talks on phone: Not on file    Gets together: Not on file   Attends religious service: Not on file    Active member of club or organization: Not on file    Attends meetings of clubs or organizations: Not on file    Relationship status: Not on file  . Intimate partner violence:    Fear of current or ex partner: Not on file    Emotionally abused: Not on file    Physically abused: Not on file    Forced sexual activity: Not on file  Other Topics Concern  . Not on file  Social History Narrative   Lost a son when he was 60 y/o   Lives w/ husband       Allergies as of 04/15/2018      Reactions   Moxifloxacin Palpitations      Medication List       Accurate as of April 15, 2018 11:59 PM. Always use your most recent med list.        amLODipine 10 MG tablet Commonly known as:  NORVASC Take 1 tablet (10 mg total) by mouth daily.   aspirin 81 MG EC tablet Take 81 mg by mouth daily.   atorvastatin 20 MG tablet Commonly known as:  LIPITOR Take 1 tablet (20 mg total) by mouth daily.   calcium citrate-vitamin D 315-200 MG-UNIT tablet Commonly known as:  CITRACAL+D Take 1 tablet by mouth. 600 mg bid   cholecalciferol 1000 units tablet Commonly known as:  VITAMIN D Take 1,000 Units by mouth daily.   Fish Oil 1200 MG Caps Take 1 capsule by mouth 2 (two) times daily.   levothyroxine 50 MCG tablet Commonly known as:  SYNTHROID, LEVOTHROID Take 1 tablet (50 mcg total) by mouth daily before breakfast.   multivitamin tablet Take 1 tablet by mouth daily.   RESTASIS 0.05 % ophthalmic emulsion Generic drug:  cycloSPORINE 1 drop 2 (two) times daily.   vitamin E 400 UNIT capsule Take 400 Units by mouth daily.           Objective:   Physical Exam BP 132/78 (BP Location: Left Arm, Patient Position: Sitting, Cuff Size: Small)   Pulse 68   Temp 97.7 F (36.5 C) (Oral)   Resp 18   Ht 5\' 1"  (1.549 m)   Wt 117 lb 4 oz (53.2 kg)   SpO2 95%   BMI 22.15 kg/m  General:   Well developed, NAD, BMI noted. HEENT:  Normocephalic .  Face symmetric, atraumatic Lungs:  CTA B Normal respiratory effort, no intercostal retractions, no accessory muscle use. Heart: RRR,  no murmur.  No pretibial edema bilaterally  Skin: Not pale. Not jaundice Neurologic:  alert & oriented X3.  Speech normal, gait appropriate for age and unassisted Psych--  Cognition and judgment appear intact.  Cooperative with normal attention span and concentration.  Behavior appropriate. No anxious or depressed appearing.      Assessment      Assessment HTN Hyperlipidemia Hypothyroidism Osteoporosis --T score 2013: -2.6,  T score 02-2014  -2.5 after  Reclast x 3. Last reclast 05-2013. T score 02-2016: -2.4, next DEXA ~ 2019/2020 --Normal Vit D 2014 SCC, skin cancer, right lower extremity, sees derm q 6 months Liposarcoma right leg 1972 (near ankle, posteriorly)  PLAN:  HTN: Recently had few increased BP readings, in the context of being under a lot of stress, see HPI. Amlodipine was increased from 5 mg to 10 mg, BPs are decreasing, this morning 128/66. Plan: Continue amlodipine 10 mg, prescription sent, low-salt diet, monitor BPs.  Watch for lower extremity edema Stress: Related to her husband's health, counseled to the best of my ability. RTC next year for CPX as recommended but call or come back sooner if needed.

## 2018-04-15 NOTE — Progress Notes (Signed)
Pre visit review using our clinic review tool, if applicable. No additional management support is needed unless otherwise documented below in the visit note. 

## 2018-04-16 NOTE — Assessment & Plan Note (Signed)
HTN: Recently had few increased BP readings, in the context of being under a lot of stress, see HPI. Amlodipine was increased from 5 mg to 10 mg, BPs are decreasing, this morning 128/66. Plan: Continue amlodipine 10 mg, prescription sent, low-salt diet, monitor BPs.  Watch for lower extremity edema Stress: Related to her husband's health, counseled to the best of my ability. RTC next year for CPX as recommended but call or come back sooner if needed.

## 2018-04-19 ENCOUNTER — Encounter: Payer: Self-pay | Admitting: Internal Medicine

## 2018-04-19 ENCOUNTER — Telehealth: Payer: Self-pay | Admitting: Internal Medicine

## 2018-04-19 NOTE — Telephone Encounter (Signed)
Advise patient, bone density test showed a T score of -2.6. She had Reclast previously. Recommend Prolia, please arrange. Also needs to stay physically active, and take over-the-counter vitamin D.

## 2018-04-19 NOTE — Telephone Encounter (Signed)
[name]- can you initiate summary of benefits for Pt please?

## 2018-04-19 NOTE — Telephone Encounter (Signed)
LM w/ Lewis- Pt's husband- asked for him to inform Pt to give [place] a call back regarding bone density results. Lewis verbalized understanding.

## 2018-04-19 NOTE — Telephone Encounter (Signed)
Patient notified of her results- she does want to try the Prolia. Told patient we would check with her insurance and call to schedule.

## 2018-04-21 ENCOUNTER — Encounter: Payer: Self-pay | Admitting: Internal Medicine

## 2018-04-26 NOTE — Telephone Encounter (Signed)
Verification ran. Waiting on summary of benefits.

## 2018-05-12 ENCOUNTER — Encounter: Payer: Self-pay | Admitting: Internal Medicine

## 2018-05-20 ENCOUNTER — Encounter: Payer: Self-pay | Admitting: Internal Medicine

## 2018-06-28 ENCOUNTER — Other Ambulatory Visit: Payer: Self-pay

## 2018-06-28 ENCOUNTER — Ambulatory Visit (INDEPENDENT_AMBULATORY_CARE_PROVIDER_SITE_OTHER): Payer: Medicare HMO

## 2018-06-28 DIAGNOSIS — M810 Age-related osteoporosis without current pathological fracture: Secondary | ICD-10-CM | POA: Diagnosis not present

## 2018-06-28 MED ORDER — DENOSUMAB 60 MG/ML ~~LOC~~ SOSY
60.0000 mg | PREFILLED_SYRINGE | Freq: Once | SUBCUTANEOUS | Status: AC
  Administered 2018-06-28: 14:00:00 60 mg via SUBCUTANEOUS

## 2018-06-28 NOTE — Progress Notes (Addendum)
Pre visit review using our clinic tool,if applicable. No additional management support is needed unless otherwise documented below in the visit note.  Patient in for Prolia injection per order from Dr. [name].   Given 60 ml SQ left arm. Patient tolerated well.  Reminder card given for next injection in 6 month.  [name], MD

## 2018-07-04 ENCOUNTER — Other Ambulatory Visit: Payer: Self-pay | Admitting: Internal Medicine

## 2018-08-20 DIAGNOSIS — Z808 Family history of malignant neoplasm of other organs or systems: Secondary | ICD-10-CM | POA: Diagnosis not present

## 2018-08-20 DIAGNOSIS — D2261 Melanocytic nevi of right upper limb, including shoulder: Secondary | ICD-10-CM | POA: Diagnosis not present

## 2018-08-20 DIAGNOSIS — D1724 Benign lipomatous neoplasm of skin and subcutaneous tissue of left leg: Secondary | ICD-10-CM | POA: Diagnosis not present

## 2018-08-20 DIAGNOSIS — L821 Other seborrheic keratosis: Secondary | ICD-10-CM | POA: Diagnosis not present

## 2018-08-20 DIAGNOSIS — D225 Melanocytic nevi of trunk: Secondary | ICD-10-CM | POA: Diagnosis not present

## 2018-08-20 DIAGNOSIS — Z85828 Personal history of other malignant neoplasm of skin: Secondary | ICD-10-CM | POA: Diagnosis not present

## 2018-08-20 DIAGNOSIS — D2272 Melanocytic nevi of left lower limb, including hip: Secondary | ICD-10-CM | POA: Diagnosis not present

## 2018-08-20 DIAGNOSIS — D2271 Melanocytic nevi of right lower limb, including hip: Secondary | ICD-10-CM | POA: Diagnosis not present

## 2018-08-20 DIAGNOSIS — D2362 Other benign neoplasm of skin of left upper limb, including shoulder: Secondary | ICD-10-CM | POA: Diagnosis not present

## 2018-08-20 DIAGNOSIS — L570 Actinic keratosis: Secondary | ICD-10-CM | POA: Diagnosis not present

## 2018-08-28 ENCOUNTER — Other Ambulatory Visit: Payer: Self-pay | Admitting: Internal Medicine

## 2018-10-02 DIAGNOSIS — R69 Illness, unspecified: Secondary | ICD-10-CM | POA: Diagnosis not present

## 2018-11-04 DIAGNOSIS — Z0100 Encounter for examination of eyes and vision without abnormal findings: Secondary | ICD-10-CM | POA: Diagnosis not present

## 2018-11-04 DIAGNOSIS — H43811 Vitreous degeneration, right eye: Secondary | ICD-10-CM | POA: Diagnosis not present

## 2018-11-04 DIAGNOSIS — H02831 Dermatochalasis of right upper eyelid: Secondary | ICD-10-CM | POA: Diagnosis not present

## 2018-11-04 DIAGNOSIS — H2513 Age-related nuclear cataract, bilateral: Secondary | ICD-10-CM | POA: Diagnosis not present

## 2018-11-04 DIAGNOSIS — H40023 Open angle with borderline findings, high risk, bilateral: Secondary | ICD-10-CM | POA: Diagnosis not present

## 2018-11-29 ENCOUNTER — Encounter: Payer: Self-pay | Admitting: Internal Medicine

## 2018-11-29 DIAGNOSIS — R69 Illness, unspecified: Secondary | ICD-10-CM | POA: Diagnosis not present

## 2018-12-26 ENCOUNTER — Encounter: Payer: Self-pay | Admitting: Internal Medicine

## 2018-12-31 ENCOUNTER — Other Ambulatory Visit: Payer: Self-pay | Admitting: Internal Medicine

## 2019-01-03 DIAGNOSIS — Z1231 Encounter for screening mammogram for malignant neoplasm of breast: Secondary | ICD-10-CM | POA: Diagnosis not present

## 2019-01-03 DIAGNOSIS — Z803 Family history of malignant neoplasm of breast: Secondary | ICD-10-CM | POA: Diagnosis not present

## 2019-01-03 LAB — HM MAMMOGRAPHY

## 2019-01-08 ENCOUNTER — Encounter: Payer: Self-pay | Admitting: Internal Medicine

## 2019-01-25 ENCOUNTER — Encounter: Payer: Self-pay | Admitting: Internal Medicine

## 2019-01-28 ENCOUNTER — Encounter: Payer: Self-pay | Admitting: Internal Medicine

## 2019-01-28 MED ORDER — DENOSUMAB 60 MG/ML ~~LOC~~ SOSY: 60.0000 mg | PREFILLED_SYRINGE | Freq: Once | SUBCUTANEOUS | 0 refills | Status: AC

## 2019-02-20 ENCOUNTER — Other Ambulatory Visit: Payer: Self-pay

## 2019-02-21 ENCOUNTER — Encounter: Payer: Self-pay | Admitting: Internal Medicine

## 2019-02-21 ENCOUNTER — Ambulatory Visit (INDEPENDENT_AMBULATORY_CARE_PROVIDER_SITE_OTHER): Payer: Medicare HMO | Admitting: Internal Medicine

## 2019-02-21 ENCOUNTER — Other Ambulatory Visit: Payer: Self-pay | Admitting: Internal Medicine

## 2019-02-21 VITALS — BP 118/70 | HR 62 | Temp 96.5°F | Resp 16 | Ht 61.0 in | Wt 116.0 lb

## 2019-02-21 DIAGNOSIS — Z0000 Encounter for general adult medical examination without abnormal findings: Secondary | ICD-10-CM

## 2019-02-21 DIAGNOSIS — E559 Vitamin D deficiency, unspecified: Secondary | ICD-10-CM

## 2019-02-21 DIAGNOSIS — Z01419 Encounter for gynecological examination (general) (routine) without abnormal findings: Secondary | ICD-10-CM

## 2019-02-21 DIAGNOSIS — M810 Age-related osteoporosis without current pathological fracture: Secondary | ICD-10-CM

## 2019-02-21 LAB — LIPID PANEL
Cholesterol: 184 mg/dL (ref 0–200)
HDL: 62 mg/dL (ref >39.00)
LDL Cholesterol: 106 mg/dL — ABNORMAL HIGH (ref 0–99)
NonHDL: 121.64
Total CHOL/HDL Ratio: 3
Triglycerides: 78 mg/dL (ref 0.0–149.0)
VLDL: 15.6 mg/dL (ref 0.0–40.0)

## 2019-02-21 LAB — COMPREHENSIVE METABOLIC PANEL
ALT: 19 U/L (ref 0–35)
AST: 20 U/L (ref 0–37)
Albumin: 4.6 g/dL (ref 3.5–5.2)
Alkaline Phosphatase: 53 U/L (ref 39–117)
BUN: 11 mg/dL (ref 6–23)
CO2: 32 mEq/L (ref 19–32)
Calcium: 9.7 mg/dL (ref 8.4–10.5)
Chloride: 102 mEq/L (ref 96–112)
Creatinine, Ser: 0.78 mg/dL (ref 0.40–1.20)
GFR: 72.3 mL/min (ref >60.00)
Glucose, Bld: 96 mg/dL (ref 70–99)
Potassium: 3.9 mEq/L (ref 3.5–5.1)
Sodium: 141 mEq/L (ref 135–145)
Total Bilirubin: 0.7 mg/dL (ref 0.2–1.2)
Total Protein: 7.2 g/dL (ref 6.0–8.3)

## 2019-02-21 LAB — CBC WITH DIFFERENTIAL/PLATELET
Basophils Absolute: 0.1 10*3/uL (ref 0.0–0.1)
Basophils Relative: 0.7 % (ref 0.0–3.0)
Eosinophils Absolute: 0.1 10*3/uL (ref 0.0–0.7)
Eosinophils Relative: 0.8 % (ref 0.0–5.0)
HCT: 44.1 % (ref 36.0–46.0)
Hemoglobin: 14.5 g/dL (ref 12.0–15.0)
Lymphocytes Relative: 16.6 % (ref 12.0–46.0)
Lymphs Abs: 1.5 10*3/uL (ref 0.7–4.0)
MCHC: 32.9 g/dL (ref 30.0–36.0)
MCV: 90.8 fl (ref 78.0–100.0)
Monocytes Absolute: 0.5 10*3/uL (ref 0.1–1.0)
Monocytes Relative: 5.5 % (ref 3.0–12.0)
Neutro Abs: 6.7 10*3/uL (ref 1.4–7.7)
Neutrophils Relative %: 76.4 % (ref 43.0–77.0)
Platelets: 322 10*3/uL (ref 150.0–400.0)
RBC: 4.85 Mil/uL (ref 3.87–5.11)
RDW: 13.2 % (ref 11.5–15.5)
WBC: 8.7 10*3/uL (ref 4.0–10.5)

## 2019-02-21 LAB — TSH: TSH: 1.3 u[IU]/mL (ref 0.35–4.50)

## 2019-02-21 LAB — VITAMIN D 25 HYDROXY (VIT D DEFICIENCY, FRACTURES): VITD: 41.51 ng/mL (ref 30.00–100.00)

## 2019-02-21 MED ORDER — DENOSUMAB 60 MG/ML ~~LOC~~ SOSY
60.0000 mg | PREFILLED_SYRINGE | Freq: Once | SUBCUTANEOUS | Status: AC
  Administered 2019-02-21: 60 mg via SUBCUTANEOUS

## 2019-02-21 MED ORDER — LEVOTHYROXINE SODIUM 50 MCG PO TABS: 50.0000 ug | ORAL_TABLET | Freq: Every day | ORAL | 3 refills | Status: DC

## 2019-02-21 MED ORDER — AMLODIPINE BESYLATE 10 MG PO TABS: 10.0000 mg | ORAL_TABLET | Freq: Every day | ORAL | 3 refills | Status: DC

## 2019-02-21 NOTE — Progress Notes (Signed)
Subjective:    Patient ID: [name], female    DOB: 07-Jan-1946, 73 y.o.   MRN: [id number]  DOS:  02/21/2019 Type of visit - description: CPX  She has been anxious about COVID-19 and the quarantine.  Managing her anxiety well. Remains active, taking walks almost daily weather permits. Good compliance with medications Ambulatory BPs checked infrequently but they are okay when checked    Review of Systems  Other than above, a 14 point review of systems is negative   Past Medical History:  Diagnosis Date  . Hyperlipidemia   . Hypertension   . Hypothyroidism   . 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 Osteoporosis   . SCC (squamous cell carcinoma)     Past Surgical History:  Procedure Laterality Date  . ABDOMINAL HYSTERECTOMY     no oophorectomy  . BREAST LUMPECTOMY     x2 left breast  . R leg liposarcoma  1973  . SQUAMOUS CELL CARCINOMA EXCISION     RLE  . TONSILLECTOMY      Social History   Socioeconomic History  . Marital status: Married    Spouse name: Not on file  . Number of children: 2  . Years of education: Not on file  . Highest education level: Not on file  Occupational History  . Occupation: retired -- Art therapist, class Research scientist (medical)   Tobacco Use  . Smoking status: Never Smoker  . Smokeless tobacco: Never Used  Substance and Sexual Activity  . Alcohol use: No  . Drug use: No  . Sexual activity: Never    Birth control/protection: Post-menopausal  Other Topics Concern  . Not on file  Social History Narrative   Lost a son when he was 19 y/o   Lives w/ husband    Social Determinants of Radio broadcast assistant Strain:   . Difficulty of Paying Living Expenses: Not on file  Food Insecurity:   . Worried About Charity fundraiser in the Last Year: Not on file  . Ran Out of Food in the Last Year: Not on file  Transportation Needs:   . Lack of Transportation (Medical): Not on file  . Lack of Transportation (Non-Medical): Not on file    Physical Activity:   . Days of Exercise per Week: Not on file  . Minutes of Exercise per Session: Not on file  Stress:   . Feeling of Stress : Not on file  Social Connections:   . Frequency of Communication with Friends and Family: Not on file  . Frequency of Social Gatherings with Friends and Family: Not on file  . Attends Religious Services: Not on file  . Active Member of Clubs or Organizations: Not on file  . Attends Archivist Meetings: Not on file  . Marital Status: Not on file  Intimate Partner Violence:   . Fear of Current or Ex-Partner: Not on file  . Emotionally Abused: Not on file  . Physically Abused: Not on file  . Sexually Abused: Not on file      Allergies as of 02/21/2019      Reactions   Moxifloxacin Palpitations      Medication List       Accurate as of February 21, 2019 11:59 PM. If you have any questions, ask your nurse or doctor.        STOP taking these medications   Restasis 0.05 % ophthalmic emulsion Generic drug: cycloSPORINE Stopped by: [name], MD  TAKE these medications   amLODipine 10 MG tablet Commonly known as: NORVASC Take 1 tablet (10 mg total) by mouth daily.   aspirin 81 MG EC tablet Take 81 mg by mouth daily.   atorvastatin 20 MG tablet Commonly known as: LIPITOR Take 1 tablet (20 mg total) by mouth daily.   calcium citrate-vitamin D 315-200 MG-UNIT tablet Commonly known as: CITRACAL+D Take 1 tablet by mouth. 600 mg bid   cholecalciferol 1000 units tablet Commonly known as: VITAMIN D Take 1,000 Units by mouth daily.   Fish Oil 1200 MG Caps Take 1 capsule by mouth 2 (two) times daily.   levothyroxine 50 MCG tablet Commonly known as: SYNTHROID Take 1 tablet (50 mcg total) by mouth daily before breakfast.   multivitamin tablet Take 1 tablet by mouth daily.   vitamin E 400 UNIT capsule Take 400 Units by mouth daily.      Family History  Problem Relation Age of Onset  . Heart disease Mother         congenital heart defect  . Cancer Father        kidney cancer  . Cancer Brother        Brain Cancer  . Cancer Maternal Aunt        breast cancer  . Cancer Paternal Aunt        breast cancer  . Colon cancer Neg Hx   . Diabetes Neg Hx         Objective:   Physical Exam BP 118/70 (BP Location: Right Arm, Patient Position: Sitting, Cuff Size: Normal)   Pulse 62   Temp (!) 96.5 F (35.8 C) (Temporal)   Resp 16   Ht 5\' 1"  (1.549 m)   Wt 116 lb (52.6 kg)   SpO2 98%   BMI 21.92 kg/m  General: Well developed, NAD, BMI noted Neck: No  thyromegaly  HEENT:  Normocephalic . Face symmetric, atraumatic Lungs:  CTA B Normal respiratory effort, no intercostal retractions, no accessory muscle use. Heart: RRR,  no murmur.  No pretibial edema bilaterally  Abdomen:  Not distended, soft, non-tender. No rebound or rigidity.   Skin: Exposed areas without rash. Not pale. Not jaundice Neurologic:  alert & oriented X3.  Speech normal, gait appropriate for age and unassisted Strength symmetric and appropriate for age.  Psych: Cognition and judgment appear intact.  Cooperative with normal attention span and concentration.  Behavior appropriate. No anxious or depressed appearing.     Assessment      Assessment HTN Hyperlipidemia Hypothyroidism Osteoporosis --T score 2013: -2.6,  T score 02-2014  -2.5 after  Reclast x 3. Last reclast 05-2013. T score 02-2016: -2.4, next DEXA ~ 2019/2020 --Normal Vit D 2014 SCC, skin cancer, right lower extremity, sees derm q 6 months Liposarcoma right leg 1972 (near ankle, posteriorly)  PLAN:  Here for CPX [id number] amlodipine, reports good ambulatory BPs Hyperlipidemia: On Lipitor, checking labs Hypothyroidism: Good Synthroid compliance, labs. Osteoporosis: T score -2.6 (04-2018). On Prolia, injection provided today.  Exercise and vitamin D.  Prolia shot today, check vitamin D levels Sees dermatology regularly Anxiety: Related to Covid and  quarantine, listening therapy provided, declines medication RTC 6 months   This visit occurred during the SARS-CoV-2 public health emergency.  Safety protocols were in place, including screening questions prior to the visit, additional usage of staff PPE, and extensive cleaning of exam room while observing appropriate contact time as indicated for disinfecting solutions.

## 2019-02-21 NOTE — Patient Instructions (Signed)
GO TO THE LAB : Get the blood work     GO TO THE FRONT DESK Schedule your next appointment for a   checkup in 6 months 

## 2019-02-22 NOTE — Assessment & Plan Note (Signed)
Here for CPX [id number] amlodipine, reports good ambulatory BPs Hyperlipidemia: On Lipitor, checking labs Hypothyroidism: Good Synthroid compliance, labs. Osteoporosis: T score -2.6 (04-2018). On Prolia, injection provided today.  Exercise and vitamin D.  Prolia shot today, check vitamin D levels Sees dermatology regularly Anxiety: Related to Covid and quarantine, listening therapy provided, declines medication RTC 6 months

## 2019-02-22 NOTE — Assessment & Plan Note (Signed)
-   Td 2012 -  Pneumonia shot 2012, Prevnar 02-2015 - Zostavax 2008 - s/p shingrix x 2 -  Had a  flu shot -[facility]: 2011 colonoscopy normal Dr. Oletta Lamas -Female care:  No further PAPs but will refer to gynecology for a female exam  MMG 12-2018 wnl  -Labs: CMP, FLP, CBC, TSH, vitamin D -Doing well with lifestyle

## 2019-04-03 ENCOUNTER — Ambulatory Visit: Payer: Medicare HMO

## 2019-04-08 ENCOUNTER — Ambulatory Visit: Payer: Medicare HMO

## 2019-04-11 NOTE — Progress Notes (Signed)
Virtual Visit via Audio Note  I connected with patient on 04/14/19 at  9:00 AM EST by audio enabled telemedicine application and verified that I am speaking with the correct person using two identifiers.   THIS ENCOUNTER IS A VIRTUAL VISIT DUE TO COVID-19 - PATIENT WAS NOT SEEN IN THE OFFICE. PATIENT HAS CONSENTED TO VIRTUAL VISIT / TELEMEDICINE VISIT   Location of patient: home  Location of provider: office  I discussed the limitations of evaluation and management by telemedicine and the availability of in person appointments. The patient expressed understanding and agreed to proceed.   Subjective:   [name] is a 74 y.o. female who presents for Medicare Annual (Subsequent) preventive examination.  Review of Systems:  Home Safety/Smoke Alarms: Feels safe in home. Smoke alarms in place.  Lives w/ husband. No stairs. Ramp going into home.   Female:     Mammo-   01/03/19    Dexa scan-  04/02/18      CCS- per pt last 01/03/10- normal    Objective:     Vitals: BP 119/69 Comment: pt reported   Advanced Directives 04/14/2019 04/08/2018 04/06/2017 04/04/2016 03/31/2015  Does Patient Have a Medical Advance Directive? Yes Yes Yes Yes Yes  Type of Paramedic of [place];Living will [place];Living will Living will [place];Living will [place];Living will;Out of facility DNR (pink MOST or yellow form)  Does patient want to make changes to medical advance directive? No - Patient declined No - Patient declined - - No - Patient declined  Copy of [place] in Chart? No - copy requested No - copy requested No - copy requested No - copy requested No - copy requested    Tobacco Social History   Tobacco Use  Smoking Status Never Smoker  Smokeless Tobacco Never Used     Counseling given: Not Answered   Clinical Intake: Pain : No/denies pain     Past Medical History:  Diagnosis Date    . Hyperlipidemia   . Hypertension   . Hypothyroidism   . Liposarcoma ([facility]) 1972   r leg  . Osteoporosis   . SCC (squamous cell carcinoma)    Past Surgical History:  Procedure Laterality Date  . ABDOMINAL HYSTERECTOMY     no oophorectomy  . BREAST LUMPECTOMY     x2 left breast  . R leg liposarcoma  1973  . SQUAMOUS CELL CARCINOMA EXCISION     RLE  . TONSILLECTOMY     Family History  Problem Relation Age of Onset  . Heart disease Mother        congenital heart defect  . Cancer Father        kidney cancer  . Cancer Brother        Brain Cancer  . Cancer Maternal Aunt        breast cancer  . Cancer Paternal Aunt        breast cancer  . Colon cancer Neg Hx   . Diabetes Neg Hx    Social History   Socioeconomic History  . Marital status: Married    Spouse name: Not on file  . Number of children: 2  . Years of education: Not on file  . Highest education level: Not on file  Occupational History  . Occupation: retired -- Art therapist, class Research scientist (medical)   Tobacco Use  . Smoking status: Never Smoker  . Smokeless tobacco: Never Used  Substance  and Sexual Activity  . Alcohol use: No  . Drug use: No  . Sexual activity: Never    Birth control/protection: Post-menopausal  Other Topics Concern  . Not on file  Social History Narrative   Lost a son when he was 40 y/o   Lives w/ husband    Social Determinants of Radio broadcast assistant Strain:   . Difficulty of Paying Living Expenses: Not on file  Food Insecurity:   . Worried About Charity fundraiser in the Last Year: Not on file  . Ran Out of Food in the Last Year: Not on file  Transportation Needs:   . Lack of Transportation (Medical): Not on file  . Lack of Transportation (Non-Medical): Not on file  Physical Activity:   . Days of Exercise per Week: Not on file  . Minutes of Exercise per Session: Not on file  Stress:   . Feeling of Stress : Not on file  Social Connections:   . Frequency of  Communication with Friends and Family: Not on file  . Frequency of Social Gatherings with Friends and Family: Not on file  . Attends Religious Services: Not on file  . Active Member of Clubs or Organizations: Not on file  . Attends Archivist Meetings: Not on file  . Marital Status: Not on file    Outpatient Encounter Medications as of 04/14/2019  Medication Sig  . amLODipine (NORVASC) 10 MG tablet Take 1 tablet (10 mg total) by mouth daily.  Marland Kitchen aspirin 81 MG EC tablet Take 81 mg by mouth daily.    Marland Kitchen atorvastatin (LIPITOR) 20 MG tablet Take 1 tablet (20 mg total) by mouth daily.  . calcium citrate-vitamin D (CITRACAL+D) 315-200 MG-UNIT per tablet Take 1 tablet by mouth. 600 mg bid  . cholecalciferol (VITAMIN D) 1000 units tablet Take 1,000 Units by mouth daily.  Marland Kitchen levothyroxine (SYNTHROID) 50 MCG tablet Take 1 tablet (50 mcg total) by mouth daily before breakfast.  . Multiple Vitamin (MULTIVITAMIN) tablet Take 1 tablet by mouth daily.    . Omega-3 Fatty Acids (FISH OIL) 1200 MG CAPS Take 1 capsule by mouth 2 (two) times daily.  . vitamin E 400 UNIT capsule Take 400 Units by mouth daily.   No facility-administered encounter medications on file as of 04/14/2019.    Activities of Daily Living In your present state of health, do you have any difficulty performing the following activities: 04/14/2019  Hearing? N  Vision? N  Difficulty concentrating or making decisions? N  Walking or climbing stairs? N  Dressing or bathing? N  Doing errands, shopping? N  Preparing Food and eating ? N  Using the Toilet? N  In the past six months, have you accidently leaked urine? N  Do you have problems with loss of bowel control? N  Managing your Medications? N  Managing your Finances? N  Housekeeping or managing your Housekeeping? N  Some recent data might be hidden    Patient Care Team: Colon Branch, MD as PCP - General (Internal Medicine) Jari Pigg, MD as Consulting Physician  (Dermatology) Shawnie Dapper, DO as Consulting Physician (Optometry) Elsie Saas, MD as Consulting Physician (Orthopedic Surgery) Beshears, Dorie Rank, DMD as Consulting Physician (Dentistry) Lorretta Harp, MD as Consulting Physician (Cardiology)    Assessment:   This is a routine wellness examination for [name] Physical assessment deferred to PCP.  Exercise Activities and Dietary recommendations Current Exercise Habits: Home exercise routine, Type of exercise: walking, Time (  Minutes): 60, Frequency (Times/Week): 5, Weekly Exercise (Minutes/Week): 300, Intensity: Mild, Exercise limited by: None identified   Diet (meal preparation, eat out, water intake, caffeinated beverages, dairy products, fruits and vegetables): 24 hr recall Breakfast: veg and fruit smoothie Lunch: soup Dinner: pizza      Goals    . Healthy Lifestyle (pt-stated)     Continue to eat heart healthy diet (full of fruits, vegetables, whole grains, lean protein, water--limit salt, fat, and sugar intake) and increase physical activity as tolerated. Continue doing brain stimulating activities (puzzles, reading, adult coloring books, staying active) to keep memory sharp.        Fall Risk Fall Risk  04/14/2019 04/08/2018 04/06/2017 04/04/2016 02/11/2016  Falls in the past year? 0 0 No No No  Number falls in past yr: 0 - - - -  Injury with Fall? 0 - - - -  Follow up Education provided;Falls prevention discussed - - - -   Depression Screen PHQ 2/9 Scores 04/14/2019 04/08/2018 02/15/2018 04/06/2017  PHQ - 2 Score 0 0 2 0  PHQ- 9 Score - - 5 -     Cognitive Function Ad8 score reviewed for issues:  Issues making decisions:no  Less interest in hobbies / activities:no  Repeats questions, stories (family complaining):no  Trouble using ordinary gadgets (microwave, computer, phone):no  Forgets the month or year: no  Mismanaging finances: no  Remembering appts:no  Daily problems with thinking and/or memory:no Ad8 score  is=0     MMSE - Mini Mental State Exam 03/31/2015  Orientation to time 5  Orientation to Place 5  Registration 3  Attention/ Calculation 5  Recall 3  Language- name 2 objects 2  Language- repeat 1  Language- follow 3 step command 3  Language- read & follow direction 1  Write a sentence 1  Copy design 0  Total score 29        Immunization History  Administered Date(s) Administered  . Influenza Split 01/03/2012, 12/03/2017  . Influenza Whole 01/07/2007, 11/27/2007, 03/26/2008, 11/12/2008, 11/04/2009  . Influenza, High Dose Seasonal PF 11/29/2018  . Influenza,inj,Quad PF,6+ Mos 01/28/2014  . Influenza-Unspecified 12/23/2014, 11/27/2015, 12/09/2016  . PFIZER SARS-COV-2 Vaccination 04/12/2019  . Pneumococcal Conjugate-13 02/09/2015  . Pneumococcal Polysaccharide-23 01/02/2011  . Tdap 01/02/2011  . Zoster 02/07/2007  . Zoster Recombinat (Shingrix) 04/18/2017, 07/07/2017   Screening Tests Health Maintenance  Topic Date Due  . MAMMOGRAM  01/03/2020  . COLONOSCOPY  01/04/2020  . TETANUS/TDAP  01/01/2021  . INFLUENZA VACCINE  Completed  . DEXA SCAN  Completed  . Hepatitis C Screening  Completed  . PNA vac Low Risk Adult  Completed      Plan:   See you next year!  Continue to eat heart healthy diet (full of fruits, vegetables, whole grains, lean protein, water--limit salt, fat, and sugar intake) and increase physical activity as tolerated.  Continue doing brain stimulating activities (puzzles, reading, adult coloring books, staying active) to keep memory sharp.   Bring a copy of your living will and/or healthcare power of attorney to your next office visit.   I have personally reviewed and noted the following in the patient's chart:   . Medical and social history . Use of alcohol, tobacco or illicit drugs  . Current medications and supplements . Functional ability and status . Nutritional status . Physical activity . Advanced directives . List of other  physicians . Hospitalizations, surgeries, and ER visits in previous 12 months . Vitals . Screenings to include cognitive, depression, and  falls . Referrals and appointments  In addition, I have reviewed and discussed with patient certain preventive protocols, quality metrics, and best practice recommendations. A written personalized care plan for preventive services as well as general preventive health recommendations were provided to patient.     Naaman Plummer [place], [place]  04/14/2019

## 2019-04-12 ENCOUNTER — Ambulatory Visit: Payer: Medicare HMO | Attending: Internal Medicine

## 2019-04-12 DIAGNOSIS — Z23 Encounter for immunization: Secondary | ICD-10-CM

## 2019-04-12 NOTE — Progress Notes (Signed)
   Covid-19 Vaccination Clinic  Name:  [name]    MRN: [id number] DOB: Mar 28, 1945  04/12/2019  [name] was observed post Covid-19 immunization for 15 minutes without incidence. She was provided with Vaccine Information Sheet and instruction to access the V-Safe system.   [name] was instructed to call 911 with any severe reactions post vaccine: Marland Kitchen Difficulty breathing  . Swelling of your face and throat  . A fast heartbeat  . A bad rash all over your body  . Dizziness and weakness    Immunizations Administered    Name Date Dose VIS Date Route   Pfizer COVID-19 Vaccine 04/12/2019  1:17 PM 0.3 mL 02/14/2019 Intramuscular   Manufacturer: [place]   Lot: [id number]   [place]: [id number]

## 2019-04-14 ENCOUNTER — Ambulatory Visit (INDEPENDENT_AMBULATORY_CARE_PROVIDER_SITE_OTHER): Payer: Medicare HMO | Admitting: *Deleted

## 2019-04-14 ENCOUNTER — Encounter: Payer: Self-pay | Admitting: *Deleted

## 2019-04-14 ENCOUNTER — Other Ambulatory Visit: Payer: Self-pay

## 2019-04-14 VITALS — BP 119/69

## 2019-04-14 DIAGNOSIS — Z0000 Encounter for general adult medical examination without abnormal findings: Secondary | ICD-10-CM

## 2019-04-14 NOTE — Patient Instructions (Signed)
See you next year!  Continue to eat heart healthy diet (full of fruits, vegetables, whole grains, lean protein, water--limit salt, fat, and sugar intake) and increase physical activity as tolerated.  Continue doing brain stimulating activities (puzzles, reading, adult coloring books, staying active) to keep memory sharp.   Bring a copy of your living will and/or healthcare power of attorney to your next office visit.   [name] , Thank you for taking time to come for your Medicare Wellness Visit. I appreciate your ongoing commitment to your health goals. Please review the following plan we discussed and let me know if I can assist you in the future.   These are the goals we discussed: Goals    . Healthy Lifestyle (pt-stated)     Continue to eat heart healthy diet (full of fruits, vegetables, whole grains, lean protein, water--limit salt, fat, and sugar intake) and increase physical activity as tolerated. Continue doing brain stimulating activities (puzzles, reading, adult coloring books, staying active) to keep memory sharp.        This is a list of the screening recommended for you and due dates:  Health Maintenance  Topic Date Due  . Mammogram  01/03/2020  . Colon Cancer Screening  01/04/2020  . Tetanus Vaccine  01/01/2021  . Flu Shot  Completed  . DEXA scan (bone density measurement)  Completed  .  Hepatitis C: One time screening is recommended by Center for Disease Control  (CDC) for  adults born from [birth year] through 1965.   Completed  . Pneumonia vaccines  Completed    Preventive Care 46 Years and Older, Female Preventive care refers to lifestyle choices and visits with your health care provider that can promote health and wellness. This includes:  A yearly physical exam. This is also called an annual well check.  Regular dental and eye exams.  Immunizations.  Screening for certain conditions.  Healthy lifestyle choices, such as diet and exercise. What can I expect for  my preventive care visit? Physical exam Your health care provider will check:  Height and weight. These may be used to calculate body mass index (BMI), which is a measurement that tells if you are at a healthy weight.  Heart rate and blood pressure.  Your skin for abnormal spots. Counseling Your health care provider may ask you questions about:  Alcohol, tobacco, and drug use.  Emotional well-being.  Home and relationship well-being.  Sexual activity.  Eating habits.  History of falls.  Memory and ability to understand (cognition).  Work and work Statistician.  Pregnancy and menstrual history. What immunizations do I need?  Influenza (flu) vaccine  This is recommended every year. Tetanus, diphtheria, and pertussis (Tdap) vaccine  You may need a Td booster every 10 years. Varicella (chickenpox) vaccine  You may need this vaccine if you have not already been vaccinated. Zoster (shingles) vaccine  You may need this after age 74. Pneumococcal conjugate (PCV13) vaccine  One dose is recommended after age 25. Pneumococcal polysaccharide (PPSV23) vaccine  One dose is recommended after age 23. Measles, mumps, and rubella (MMR) vaccine  You may need at least one dose of MMR if you were born in 1957 or later. You may also need a second dose. Meningococcal conjugate (MenACWY) vaccine  You may need this if you have certain conditions. Hepatitis A vaccine  You may need this if you have certain conditions or if you travel or work in places where you may be exposed to hepatitis A. Hepatitis B  vaccine  You may need this if you have certain conditions or if you travel or work in places where you may be exposed to hepatitis B. Haemophilus influenzae type b (Hib) vaccine  You may need this if you have certain conditions. You may receive vaccines as individual doses or as more than one vaccine together in one shot (combination vaccines). Talk with your health care provider  about the risks and benefits of combination vaccines. What tests do I need? Blood tests  Lipid and cholesterol levels. These may be checked every 5 years, or more frequently depending on your overall health.  Hepatitis C test.  Hepatitis B test. Screening  Lung cancer screening. You may have this screening every year starting at age 59 if you have a 30-pack-year history of smoking and currently smoke or have quit within the past 15 years.  Colorectal cancer screening. All adults should have this screening starting at age 75 and continuing until age 49. Your health care provider may recommend screening at age 46 if you are at increased risk. You will have tests every 1-10 years, depending on your results and the type of screening test.  Diabetes screening. This is done by checking your blood sugar (glucose) after you have not eaten for a while (fasting). You may have this done every 1-3 years.  Mammogram. This may be done every 1-2 years. Talk with your health care provider about how often you should have regular mammograms.  BRCA-related cancer screening. This may be done if you have a family history of breast, ovarian, tubal, or peritoneal cancers. Other tests  Sexually transmitted disease (STD) testing.  Bone density scan. This is done to screen for osteoporosis. You may have this done starting at age 41. Follow these instructions at home: Eating and drinking  Eat a diet that includes fresh fruits and vegetables, whole grains, lean protein, and low-fat dairy products. Limit your intake of foods with high amounts of sugar, saturated fats, and salt.  Take vitamin and mineral supplements as recommended by your health care provider.  Do not drink alcohol if your health care provider tells you not to drink.  If you drink alcohol: ? Limit how much you have to 0-1 drink a day. ? Be aware of how much alcohol is in your drink. In the [place] one drink equals one 12 oz bottle of beer (355  mL), one 5 oz glass of wine (148 mL), or one 1 oz glass of hard liquor (44 mL). Lifestyle  Take daily care of your teeth and gums.  Stay active. Exercise for at least 30 minutes on 5 or more days each week.  Do not use any products that contain nicotine or tobacco, such as cigarettes, e-cigarettes, and chewing tobacco. If you need help quitting, ask your health care provider.  If you are sexually active, practice safe sex. Use a condom or other form of protection in order to prevent STIs (sexually transmitted infections).  Talk with your health care provider about taking a low-dose aspirin or statin. What's next?  Go to your health care provider once a year for a well check visit.  Ask your health care provider how often you should have your eyes and teeth checked.  Stay up to date on all vaccines. This information is not intended to replace advice given to you by your health care provider. Make sure you discuss any questions you have with your health care provider. Document Revised: 02/14/2018 Document Reviewed: 02/14/2018 Elsevier Patient Education  [place].

## 2019-04-21 ENCOUNTER — Encounter: Payer: Self-pay | Admitting: Obstetrics & Gynecology

## 2019-04-21 ENCOUNTER — Ambulatory Visit (INDEPENDENT_AMBULATORY_CARE_PROVIDER_SITE_OTHER): Payer: Medicare HMO | Admitting: Obstetrics & Gynecology

## 2019-04-21 ENCOUNTER — Other Ambulatory Visit: Payer: Self-pay

## 2019-04-21 VITALS — BP 162/61 | HR 64 | Ht 61.0 in | Wt 116.1 lb

## 2019-04-21 DIAGNOSIS — Z01419 Encounter for gynecological examination (general) (routine) without abnormal findings: Secondary | ICD-10-CM | POA: Diagnosis not present

## 2019-04-21 DIAGNOSIS — M810 Age-related osteoporosis without current pathological fracture: Secondary | ICD-10-CM

## 2019-04-21 DIAGNOSIS — N393 Stress incontinence (female) (male): Secondary | ICD-10-CM

## 2019-04-21 NOTE — Patient Instructions (Signed)

## 2019-04-21 NOTE — Progress Notes (Signed)
Subjective:     [name] is a 74 y.o. female here for a routine exam.  Current complaints: none. When queried pt reports leakage of urine that occur intermittently with walking She goes walking daily at 0430-0500 am when it is not icy. She [facility] pre[facility]. She does not feel that the incontinence is a huge problem for her.     Gynecologic History No LMP recorded. Patient has had a hysterectomy. Contraception: post menopausal status Last Pap: s/p hyst in 1970's for prolapse  Last mammogram: 01/03/2019. Results were: normal BMD 04/02/2018 pos for osteoporosis  Obstetric History OB History  Gravida Para Term Preterm AB Living  2 2 2     2   SAB TAB Ectopic Multiple Live Births          2    # Outcome Date GA Lbr Len/2nd Weight Sex Delivery Anes PTL Lv  2 Term 31 [redacted]w[redacted]d   F Vag-Spont None N LIV  1 Term 65 [redacted]w[redacted]d   M Vag-Spont None N LIV   The following portions of the patient's history were reviewed and updated as appropriate: allergies, current medications, past family history, past medical history, past social history, past surgical history and problem list.  Review of Systems Pertinent items are noted in HPI.    Objective:  BP (!) 162/61   Pulse 64   Ht 5\' 1"  (1.549 m)   Wt 116 lb 1.3 oz (52.7 kg)   BMI 21.93 kg/m   General Appearance:    Alert, cooperative, no distress, appears stated age  Head:    Normocephalic, without obvious abnormality, atraumatic  Eyes:    conjunctiva/corneas clear, EOM's intact, both eyes  Ears:    Normal external ear canals, both ears  Nose:   Nares normal, septum midline, mucosa normal, no drainage    or sinus tenderness  Throat:   Lips, mucosa, and tongue normal; teeth and gums normal  Neck:   Supple, symmetrical, trachea midline, no adenopathy;    thyroid:  no enlargement/tenderness/nodules  Back:     Symmetric, no curvature, ROM normal, no CVA tenderness  Lungs:     respirations unlabored  Chest Wall:    No tenderness or  deformity   Heart:    Regular rate and rhythm  Breast Exam:    No tenderness, masses, or nipple abnormality  Abdomen:     Soft, non-tender, bowel sounds active all four quadrants,    no masses, no organomegaly  Genitalia:    Normal female without lesion, discharge or tenderness   q-tip angle >30 degrees. No leakage of urein noted with valsalva. The vaginal cuff is not prolapsed.     Extremities:   Extremities normal, atraumatic, no cyanosis or edema  Pulses:   2+ and symmetric all extremities  Skin:   Skin color, texture, turgor normal, no rashes or lesions     Assessment:    Healthy female exam.   Stress urinary incontinence. Reviewed options for pt. She is not interested in PT or further eval but, she would like to try Kegel exercises.      Plan:    f/u in 1 year or sooner prn Reviewed Kegel exercises.  Pt will f/u sooner if she desires further eval for SUI.   Buell Parcel L. Harraway-Smith, M.D., [name]

## 2019-04-29 DIAGNOSIS — R69 Illness, unspecified: Secondary | ICD-10-CM | POA: Diagnosis not present

## 2019-05-05 DIAGNOSIS — H2513 Age-related nuclear cataract, bilateral: Secondary | ICD-10-CM | POA: Diagnosis not present

## 2019-05-05 DIAGNOSIS — H43811 Vitreous degeneration, right eye: Secondary | ICD-10-CM | POA: Diagnosis not present

## 2019-05-05 DIAGNOSIS — H40023 Open angle with borderline findings, high risk, bilateral: Secondary | ICD-10-CM | POA: Diagnosis not present

## 2019-05-05 DIAGNOSIS — H02831 Dermatochalasis of right upper eyelid: Secondary | ICD-10-CM | POA: Diagnosis not present

## 2019-05-07 ENCOUNTER — Ambulatory Visit: Payer: Medicare HMO | Attending: Internal Medicine

## 2019-05-07 DIAGNOSIS — Z23 Encounter for immunization: Secondary | ICD-10-CM | POA: Insufficient documentation

## 2019-05-07 NOTE — Progress Notes (Signed)
   Covid-19 Vaccination Clinic  Name:  [name]    MRN: [id number] DOB: March 25, 1945  05/07/2019  [name] was observed post Covid-19 immunization for 15 minutes without incident. She was provided with Vaccine Information Sheet and instruction to access the V-Safe system.   [name] was instructed to call 911 with any severe reactions post vaccine: Marland Kitchen Difficulty breathing  . Swelling of face and throat  . A fast heartbeat  . A bad rash all over body  . Dizziness and weakness   Immunizations Administered    Name Date Dose VIS Date Route   Pfizer COVID-19 Vaccine 05/07/2019  9:04 AM 0.3 mL 02/14/2019 Intramuscular   Manufacturer: [place]   Lot: [id number]   [place]: [id number]

## 2019-06-30 ENCOUNTER — Other Ambulatory Visit: Payer: Self-pay | Admitting: Internal Medicine

## 2019-07-14 ENCOUNTER — Encounter: Payer: Self-pay | Admitting: Internal Medicine

## 2019-07-17 ENCOUNTER — Encounter: Payer: Self-pay | Admitting: Internal Medicine

## 2019-07-23 DIAGNOSIS — R69 Illness, unspecified: Secondary | ICD-10-CM | POA: Diagnosis not present

## 2019-08-21 DIAGNOSIS — L570 Actinic keratosis: Secondary | ICD-10-CM | POA: Diagnosis not present

## 2019-08-21 DIAGNOSIS — Z85828 Personal history of other malignant neoplasm of skin: Secondary | ICD-10-CM | POA: Diagnosis not present

## 2019-08-21 DIAGNOSIS — D2362 Other benign neoplasm of skin of left upper limb, including shoulder: Secondary | ICD-10-CM | POA: Diagnosis not present

## 2019-08-21 DIAGNOSIS — L738 Other specified follicular disorders: Secondary | ICD-10-CM | POA: Diagnosis not present

## 2019-08-21 DIAGNOSIS — D2261 Melanocytic nevi of right upper limb, including shoulder: Secondary | ICD-10-CM | POA: Diagnosis not present

## 2019-08-21 DIAGNOSIS — D2272 Melanocytic nevi of left lower limb, including hip: Secondary | ICD-10-CM | POA: Diagnosis not present

## 2019-08-21 DIAGNOSIS — L821 Other seborrheic keratosis: Secondary | ICD-10-CM | POA: Diagnosis not present

## 2019-08-21 DIAGNOSIS — L578 Other skin changes due to chronic exposure to nonionizing radiation: Secondary | ICD-10-CM | POA: Diagnosis not present

## 2019-08-21 DIAGNOSIS — D1724 Benign lipomatous neoplasm of skin and subcutaneous tissue of left leg: Secondary | ICD-10-CM | POA: Diagnosis not present

## 2019-08-21 DIAGNOSIS — D225 Melanocytic nevi of trunk: Secondary | ICD-10-CM | POA: Diagnosis not present

## 2019-08-22 ENCOUNTER — Ambulatory Visit: Payer: Medicare HMO | Admitting: Internal Medicine

## 2019-08-25 ENCOUNTER — Ambulatory Visit (INDEPENDENT_AMBULATORY_CARE_PROVIDER_SITE_OTHER): Payer: Medicare HMO | Admitting: Internal Medicine

## 2019-08-25 ENCOUNTER — Other Ambulatory Visit: Payer: Self-pay

## 2019-08-25 ENCOUNTER — Encounter: Payer: Self-pay | Admitting: Internal Medicine

## 2019-08-25 VITALS — BP 136/78 | HR 73 | Temp 98.7°F | Resp 16 | Ht 61.0 in | Wt 117.5 lb

## 2019-08-25 DIAGNOSIS — E039 Hypothyroidism, unspecified: Secondary | ICD-10-CM | POA: Diagnosis not present

## 2019-08-25 DIAGNOSIS — E785 Hyperlipidemia, unspecified: Secondary | ICD-10-CM

## 2019-08-25 DIAGNOSIS — M810 Age-related osteoporosis without current pathological fracture: Secondary | ICD-10-CM

## 2019-08-25 DIAGNOSIS — I10 Essential (primary) hypertension: Secondary | ICD-10-CM | POA: Diagnosis not present

## 2019-08-25 MED ORDER — DENOSUMAB 60 MG/ML ~~LOC~~ SOSY
60.0000 mg | PREFILLED_SYRINGE | Freq: Once | SUBCUTANEOUS | Status: AC
  Administered 2019-08-25: 60 mg via SUBCUTANEOUS

## 2019-08-25 NOTE — Progress Notes (Signed)
Pre visit review using our clinic review tool, if applicable. No additional management support is needed unless otherwise documented below in the visit note. 

## 2019-08-25 NOTE — Progress Notes (Signed)
Subjective:    Patient ID: [name], female    DOB: September 09, 1945, 74 y.o.   MRN: [id number]  DOS:  08/25/2019 Type of visit - description: Routine checkup Since the last office visit she is doing well. We reviewed together her medication list & previous results.  We also review her ambulatory BPs.   Review of Systems Denies chest pain or difficulty breathing No nausea, vomiting, diarrhea  Past Medical History:  Diagnosis Date  . Hyperlipidemia   . Hypertension   . Hypothyroidism   . 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 Osteoporosis   . SCC (squamous cell carcinoma)     Past Surgical History:  Procedure Laterality Date  . ABDOMINAL HYSTERECTOMY     no oophorectomy  . BREAST LUMPECTOMY     x2 left breast  . R leg liposarcoma  1973  . SQUAMOUS CELL CARCINOMA EXCISION     RLE  . TONSILLECTOMY      Allergies as of 08/25/2019      Reactions   Moxifloxacin Palpitations      Medication List       Accurate as of August 25, 2019 11:59 PM. If you have any questions, ask your nurse or doctor.        amLODipine 10 MG tablet Commonly known as: NORVASC Take 1 tablet (10 mg total) by mouth daily.   aspirin 81 MG EC tablet Take 81 mg by mouth daily.   atorvastatin 20 MG tablet Commonly known as: LIPITOR Take 1 tablet (20 mg total) by mouth daily.   calcium citrate-vitamin D 315-200 MG-UNIT tablet Commonly known as: CITRACAL+D Take 1 tablet by mouth. 600 mg bid   cholecalciferol 1000 units tablet Commonly known as: VITAMIN D Take 1,000 Units by mouth daily.   Fish Oil 1200 MG Caps Take 1 capsule by mouth 2 (two) times daily.   levothyroxine 50 MCG tablet Commonly known as: SYNTHROID Take 1 tablet (50 mcg total) by mouth daily before breakfast.   multivitamin tablet Take 1 tablet by mouth daily.   vitamin E 180 MG (400 UNITS) capsule Take 400 Units by mouth daily.          Objective:   Physical Exam BP 136/78 (BP Location: Left Arm, Patient Position:  Sitting, Cuff Size: Small)   Pulse 73   Temp 98.7 F (37.1 C) (Temporal)   Resp 16   Ht 5\' 1"  (1.549 m)   Wt 117 lb 8 oz (53.3 kg)   SpO2 99%   BMI 22.20 kg/m  General:   Well developed, NAD, BMI noted. HEENT:  Normocephalic . Face symmetric, atraumatic Lungs:  CTA B Normal respiratory effort, no intercostal retractions, no accessory muscle use. Heart: RRR,  no murmur.  Lower extremities: no pretibial edema bilaterally  Skin: Not pale. Not jaundice Neurologic:  alert & oriented X3.  Speech normal, gait appropriate for age and unassisted Psych--  Cognition and judgment appear intact.  Cooperative with normal attention span and concentration.  Behavior appropriate. No anxious or depressed appearing.      Assessment    Assessment HTN Hyperlipidemia Hypothyroidism Osteoporosis --T score 2013: -2.6,  T score 02-2014  -2.5 after  Reclast x 3. Last reclast 05-2013. T score 02-2016: -2.4, next DEXA ~ 2019/2020 --Normal Vit D 2014 SCC, skin cancer, right lower extremity, sees derm q 6 months Liposarcoma right leg 1972 (near ankle, posteriorly)  PLAN:  HTN: On amlodipine, ambulatory BPs range from 105-140, typically in the 120s.  BP today slightly elevated, recommend no change. Hyperlipidemia: On Lipitor, well controlled. Hypothyroidism: Last TSH satisfactory, continue Synthroid. Osteoporosis: Prolia today, on vitamin D, last levels normal. SCC: Sees Derm regularly, just had 4 areas freezed. Recommend the seasonal flu shot every year RTC 02/2020 CPX This visit occurred during the SARS-CoV-2 public health emergency.  Safety protocols were in place, including screening questions prior to the visit, additional usage of staff PPE, and extensive cleaning of exam room while observing appropriate contact time as indicated for disinfecting solutions.

## 2019-08-25 NOTE — Patient Instructions (Addendum)
Happy early Rudene Anda!   Continue checking your blood pressures BP GOAL is between 110/65 and  135/85. If it is consistently higher or lower, let me know  Get your flu shot this fall   Addy, [place] back for your physical exam by December 2021

## 2019-08-26 NOTE — Assessment & Plan Note (Signed)
HTN: On amlodipine, ambulatory BPs range from 105-140, typically in the 120s.  BP today slightly elevated, recommend no change. Hyperlipidemia: On Lipitor, well controlled. Hypothyroidism: Last TSH satisfactory, continue Synthroid. Osteoporosis: Prolia today, on vitamin D, last levels normal. SCC: Sees Derm regularly, just had 4 areas freezed. Recommend the seasonal flu shot every year RTC 02/2020 CPX

## 2019-09-27 ENCOUNTER — Other Ambulatory Visit: Payer: Self-pay | Admitting: Internal Medicine

## 2019-10-28 DIAGNOSIS — R69 Illness, unspecified: Secondary | ICD-10-CM | POA: Diagnosis not present

## 2019-11-05 DIAGNOSIS — H5213 Myopia, bilateral: Secondary | ICD-10-CM | POA: Diagnosis not present

## 2019-11-05 DIAGNOSIS — H2513 Age-related nuclear cataract, bilateral: Secondary | ICD-10-CM | POA: Diagnosis not present

## 2019-11-05 DIAGNOSIS — H43811 Vitreous degeneration, right eye: Secondary | ICD-10-CM | POA: Diagnosis not present

## 2019-11-05 DIAGNOSIS — H40023 Open angle with borderline findings, high risk, bilateral: Secondary | ICD-10-CM | POA: Diagnosis not present

## 2019-12-10 ENCOUNTER — Emergency Department (HOSPITAL_COMMUNITY)
Admission: EM | Admit: 2019-12-10 | Discharge: 2019-12-10 | Disposition: A | Discharge status: Home / Self Care | Payer: Medicare HMO | Attending: Emergency Medicine | Admitting: Emergency Medicine

## 2019-12-10 ENCOUNTER — Encounter (HOSPITAL_COMMUNITY): Payer: Self-pay | Admitting: Emergency Medicine

## 2019-12-10 ENCOUNTER — Other Ambulatory Visit: Payer: Self-pay

## 2019-12-10 DIAGNOSIS — Z85828 Personal history of other malignant neoplasm of skin: Secondary | ICD-10-CM | POA: Diagnosis not present

## 2019-12-10 DIAGNOSIS — T63441A Toxic effect of venom of bees, accidental (unintentional), initial encounter: Secondary | ICD-10-CM | POA: Insufficient documentation

## 2019-12-10 DIAGNOSIS — R000 Tachycardia, unspecified: Secondary | ICD-10-CM | POA: Diagnosis not present

## 2019-12-10 DIAGNOSIS — I10 Essential (primary) hypertension: Secondary | ICD-10-CM | POA: Diagnosis not present

## 2019-12-10 DIAGNOSIS — Z91030 Bee allergy status: Secondary | ICD-10-CM | POA: Diagnosis not present

## 2019-12-10 DIAGNOSIS — Z7982 Long term (current) use of aspirin: Secondary | ICD-10-CM | POA: Insufficient documentation

## 2019-12-10 DIAGNOSIS — Z79899 Other long term (current) drug therapy: Secondary | ICD-10-CM | POA: Diagnosis not present

## 2019-12-10 DIAGNOSIS — E039 Hypothyroidism, unspecified: Secondary | ICD-10-CM | POA: Diagnosis not present

## 2019-12-10 DIAGNOSIS — T63484A Toxic effect of venom of other arthropod, undetermined, initial encounter: Secondary | ICD-10-CM | POA: Diagnosis not present

## 2019-12-10 DIAGNOSIS — I959 Hypotension, unspecified: Secondary | ICD-10-CM | POA: Diagnosis not present

## 2019-12-10 DIAGNOSIS — R0689 Other abnormalities of breathing: Secondary | ICD-10-CM | POA: Diagnosis not present

## 2019-12-10 DIAGNOSIS — R0902 Hypoxemia: Secondary | ICD-10-CM | POA: Diagnosis not present

## 2019-12-10 DIAGNOSIS — R42 Dizziness and giddiness: Secondary | ICD-10-CM | POA: Diagnosis present

## 2019-12-10 MED ORDER — EPINEPHRINE 0.3 MG/0.3ML IJ SOAJ: 0.3000 mg | INTRAMUSCULAR | 0 refills | Status: DC | PRN

## 2019-12-10 MED ORDER — PREDNISONE 20 MG PO TABS: 40.0000 mg | ORAL_TABLET | Freq: Every day | ORAL | 0 refills | Status: DC

## 2019-12-10 NOTE — Discharge Instructions (Addendum)
Return for worsening symptoms lightheadedness dizziness or trouble breathing.  Follow-up with your PCP and potentially an allergist.

## 2019-12-10 NOTE — ED Triage Notes (Signed)
BIBA Per EMS: Pt coming from home Pt was stung by yellow jacket while she was outside. States she was stung by one about a month ago and had no reaction but this time was different.  Initial S/S diaphoretic, dizzy, pale, and had no radial pulses 50 mg PO benadryl  0.3mg  Epi IM given at 1415  Another IM epi given at 1427  Vitals  106/70 88 24 95% room air 18 L hand  125 mg IV solumedrol  400 mL NS

## 2019-12-10 NOTE — ED Provider Notes (Signed)
[facility] DEPT Provider Note   CSN: [id number] Arrival date & time: 12/10/19  1449     History Chief Complaint  Patient presents with  . Allergic Reaction    [name] is a 74 y.o. female.  HPI Patient presents after a yellowjacket sting.  Happened while she was mowing the lawn.  Stung her on the right elbow.  States within 10 minutes she began to feel lightheaded and dizzy.  EMS was called.  Reportedly was diaphoretic dizzy pale.  Reportedly had no radial pulses.  Patient had taken 50 mg oral Benadryl and EpiPen was given twice by EMS.  Patient feeling better now.  Also had been given Solu-Medrol some fluid.  Had been stung by yellow jacket also about a month ago with no previous reaction.  No chest pain.  No trouble breathing.  History of hypertension but no other cardiac history.  Has been doing well otherwise recently.  States a week ago she did get her third Covid vaccine and felt bad around today.    Past Medical History:  Diagnosis Date  . Hyperlipidemia   . Hypertension   . Hypothyroidism   . 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 Osteoporosis   . SCC (squamous cell carcinoma)     Patient Active Problem List   Diagnosis Date Noted  . DJD (degenerative joint disease) 10/12/2016  . PCP NOTES >>>>>> 02/09/2015  . Liposarcoma ([place])   . Annual physical exam 01/02/2011  . NEOPLASMS UNSPEC NATURE BONE SOFT TISSUE&SKIN 06/27/2007  . Hypothyroidism 08/01/2006  . Hyperlipidemia 08/01/2006  . Essential hypertension 08/01/2006  . Osteoporosis 08/01/2006    Past Surgical History:  Procedure Laterality Date  . ABDOMINAL HYSTERECTOMY     no oophorectomy  . BREAST LUMPECTOMY     x2 left breast  . R leg liposarcoma  1973  . SQUAMOUS CELL CARCINOMA EXCISION     RLE  . TONSILLECTOMY       OB History    Gravida  2   Para  2   Term  2   Preterm      AB      Living  2     SAB      TAB      Ectopic      Multiple      Live  Births  2           Family History  Problem Relation Age of Onset  . Heart disease Mother        congenital heart defect  . Cancer Father        kidney cancer  . Cancer Brother        Brain Cancer  . Cancer Maternal Aunt        breast cancer  . Cancer Paternal Aunt        breast cancer  . Colon cancer Neg Hx   . Diabetes Neg Hx     Social History   Tobacco Use  . Smoking status: Never Smoker  . Smokeless tobacco: Never Used  Substance Use Topics  . Alcohol use: No  . Drug use: No    Home Medications Prior to Admission medications   Medication Sig Start Date End Date Taking? Authorizing Provider  amLODipine (NORVASC) 10 MG tablet Take 1 tablet (10 mg total) by mouth daily. 02/21/19   Colon Branch, MD  aspirin 81 MG EC tablet Take 81 mg by mouth daily.  [provider]  atorvastatin (LIPITOR) 20 MG tablet Take 1 tablet (20 mg total) by mouth daily. 09/29/19   Colon Branch, MD  calcium citrate-vitamin D (CITRACAL+D) 315-200 MG-UNIT per tablet Take 1 tablet by mouth. 600 mg bid    [provider]  cholecalciferol (VITAMIN D) 1000 units tablet Take 1,000 Units by mouth daily.    [provider]  EPINEPHrine 0.3 mg/0.3 mL IJ SOAJ injection Inject 0.3 mg into the muscle as needed for anaphylaxis. 12/10/19   Davonna Belling, MD  levothyroxine (SYNTHROID) 50 MCG tablet Take 1 tablet (50 mcg total) by mouth daily before breakfast. 02/21/19   Colon Branch, MD  Multiple Vitamin (MULTIVITAMIN) tablet Take 1 tablet by mouth daily.      [provider]  Omega-3 Fatty Acids (FISH OIL) 1200 MG CAPS Take 1 capsule by mouth 2 (two) times daily.    [provider]  predniSONE (DELTASONE) 20 MG tablet Take 2 tablets (40 mg total) by mouth daily. 12/11/19   Davonna Belling, MD  vitamin E 400 UNIT capsule Take 400 Units by mouth daily.    [provider]    Allergies    Moxifloxacin  Review of Systems   Review of Systems    Constitutional: Positive for diaphoresis. Negative for appetite change.  HENT: Negative for congestion.   Respiratory: Negative for shortness of breath and wheezing.   Cardiovascular: Negative for chest pain.  Gastrointestinal: Negative for abdominal pain.  Musculoskeletal: Negative for back pain.  Skin: Negative for rash.  Neurological: Positive for light-headedness.  Psychiatric/Behavioral: Negative for confusion.    Physical Exam Updated Vital Signs BP (!) 116/59   Pulse 73   Temp (!) 97.4 F (36.3 C) (Oral)   Resp (!) 23   Ht 5\' 1"  (1.549 m)   Wt 53.5 kg   SpO2 96%   BMI 22.30 kg/m   Physical Exam Vitals and nursing note reviewed.  HENT:     Head: Atraumatic.     Mouth/Throat:     Comments: No lingual or posterior pharyngeal edema or swelling.  No stridor. Eyes:     Pupils: Pupils are equal, round, and reactive to light.  Cardiovascular:     Rate and Rhythm: Normal rate and regular rhythm.  Abdominal:     Tenderness: There is no abdominal tenderness.  Musculoskeletal:        General: No tenderness.     Comments: Mild swelling on right elbow at site of sting.  Skin:    General: Skin is warm.     Capillary Refill: Capillary refill takes less than 2 seconds.  Neurological:     General: No focal deficit present.     Mental Status: She is alert.     ED Results / Procedures / Treatments   Labs (all labs ordered are listed, but only abnormal results are displayed) Labs Reviewed - No data to display  EKG None  Radiology No results found.  Procedures Procedures (including critical care time)  Medications Ordered in ED Medications - No data to display  ED Course  I have reviewed the triage vital signs and the nursing notes.  Pertinent labs & imaging results that were available during my care of the patient were reviewed by me and considered in my medical decision making (see chart for details).    MDM Rules/Calculators/A&P  Patient with potential allergic reaction to bee sting versus vagal event.  Has been treated with epinephrine x2 Benadryl and Solu-Medrol prior to arrival.  Has been feeling much better.  Has been monitored for around 4 hours in the ER.  Feeling better but a little sleepy from the Benadryl.  Will discharge home with EpiPen's and another date of steroids.  Outpatient follow-up with PCP and potentially allergist.  Given follow-up instructions to in terms of recurrence of allergic reaction. Final Clinical Impression(s) / ED Diagnoses Final diagnoses:  Bee sting allergy    Rx / DC Orders ED Discharge Orders         Ordered    predniSONE (DELTASONE) 20 MG tablet  Daily        12/10/19 1824    EPINEPHrine 0.3 mg/0.3 mL IJ SOAJ injection  As needed        12/10/19 Samella Parr, MD 12/10/19 [phone]

## 2019-12-10 NOTE — ED Notes (Signed)
Husband at bedside.  

## 2019-12-12 ENCOUNTER — Other Ambulatory Visit: Payer: Self-pay | Admitting: Internal Medicine

## 2019-12-12 ENCOUNTER — Encounter: Payer: Self-pay | Admitting: Internal Medicine

## 2019-12-12 ENCOUNTER — Telehealth: Payer: Self-pay | Admitting: *Deleted

## 2019-12-12 MED ORDER — PREDNISONE 20 MG PO TABS
40.0000 mg | ORAL_TABLET | Freq: Every day | ORAL | 0 refills | Status: DC
Start: 2019-12-12 — End: 2020-01-07

## 2019-12-12 NOTE — Telephone Encounter (Signed)
TOC CM received call from pt and states pharmacy did not receive ED Rx. Contacted Kristopher Oppenheim to call in Rx. TOC back to pt and spoke to husband. He will update pt.  [place], [facility] ED TOC CM [phone]

## 2019-12-17 DIAGNOSIS — R69 Illness, unspecified: Secondary | ICD-10-CM | POA: Diagnosis not present

## 2020-01-04 ENCOUNTER — Encounter: Payer: Self-pay | Admitting: Internal Medicine

## 2020-01-04 DIAGNOSIS — Z91030 Bee allergy status: Secondary | ICD-10-CM

## 2020-01-06 NOTE — Progress Notes (Signed)
New Patient Note  RE: [name] MRN: [id number] DOB: 11/26/1945 Date of Office Visit: 01/07/2020  Referring provider: Colon Branch, MD Primary care provider: Colon Branch, MD  Chief Complaint: Allergic Rhinitis  (stung by yellow jacket twice and encountered fire ants...)  History of Present Illness: I had the pleasure of seeing [name] for initial evaluation at the [facility] on 01/07/2020. She is a 74 y.o. female, who is referred here by Colon Branch, MD for the evaluation of hymenoptera allergy.  On October 12/10/2019 patient was mowing the lawn around 2PM and got stung on her right elbow. Immediately her hands and soles of her feet started itching and within 15 minutes she felt lightheaded and dizzy. Her husband was mowing the lawn as well and she walked towards the porch of the house. Patient's husband tried to give patient benadryl but it just "rolled off her mouth." Apparently EMS couldn't get a radial pulse and patient felt shaky. EMS administered one epinephrine and then she got another dose en route to the ER. She was given some solumedrol, benadryl and fluids. Patient was observed for 4 hours and upon discharge patient was feeling better.  Denies any hives, rash, trouble breathing, swelling.  She is unsure of what she had to eat specifically for lunch but most likely vegetables. Patient had vegetables since then with no issues. She did have some itching a few days ago and since then she had on and off itching/erythema with no clear triggers.  Patient got stung 1 week earlier with a yellow jacket with no symptoms.  1 week prior to that patient was stung by fire ant and hand itching and rash in some areas of her body where the clothes were tight. Symptoms resolved within 1 day.  Patient's husband concerned if the honey she is using to bake her bread is contributing to her itchy skin.  No other history of reactions to stinging insects.  12/10/2019 ER  visit: "Patient presents after a yellowjacket sting.  Happened while she was mowing the lawn.  Stung her on the right elbow.  States within 10 minutes she began to feel lightheaded and dizzy.  EMS was called.  Reportedly was diaphoretic dizzy pale.  Reportedly had no radial pulses.  Patient had taken 50 mg oral Benadryl and EpiPen was given twice by EMS.  Patient feeling better now.  Also had been given Solu-Medrol some fluid.  Had been stung by yellow jacket also about a month ago with no previous reaction.  No chest pain.  No trouble breathing.  History of hypertension but no other cardiac history.  Has been doing well otherwise recently.  States a week ago she did get her third Covid vaccine and felt bad around today."  Assessment and Plan: [name] is a 74 y.o. female with: Hymenoptera allergy Anaphylactic reaction in the form of itching and feeling lightheaded and dizzy.  To possibly yellow jacket requiring epinephrine, Solu-Medrol and Benadryl.  Symptoms resolved within 1 hour. Previously stung by yellow jacket without any symptoms.  Possibly stung by fire ant which caused pruritic rash on her body for 1 day.  Today's skin testing showed: negative to fire ant.  Continue to avoid bee stings and fire ant.  For mild symptoms you can take over the counter antihistamines such as Benadryl and monitor symptoms closely. If symptoms worsen or if you have severe symptoms including breathing issues, throat closure, significant swelling, whole body hives, severe  diarrhea and vomiting, lightheadedness then inject epinephrine and seek immediate medical care afterwards.  Demonstrated proper epinephrine use.  Emergency action plan given.   Get bloodwork for hymenoptera panel and fire ant and if positive will discuss starting on allergy injections.   If results are negative then will schedule for allergy skin testing in our [facility] office for hymenoptera panel. This is done once a month.   Pruritus Pruritic  rash since above episode with no specific triggers noted.  There is concern whether honey or other items may be contributing to her symptoms.  Today's skin prick testing was negative to common foods.  There is no honey skin testing available and will get blood work for this.  Monitor symptoms.  Not sure what's causing the pruritic rash.   May use over the counter antihistamines such as Zyrtec (cetirizine) 10mg  daily as needed.  Avoid honey for now.  See below for proper skin care.   Other allergic rhinitis Some rhinitis symptoms in the spring and fall for which she usually does not take any medications for.  Today's skin prick testing was positive to tree pollen and dust mites.  Start environmental control measures as below.  May use over the counter antihistamines such as Zyrtec (cetirizine), Claritin (loratadine), Allegra (fexofenadine), or Xyzal (levocetirizine) daily as needed.  Return in about 6 months (around 07/06/2020).  Lab Orders     Honey IgE     Allergen Hymenoptera Panel     Tryptase     Allergen Fire Ant  Other allergy screening: Asthma: as a child and no issues after age 46. Rhino conjunctivitis:  Some rhinitis symptoms in the spring and fall. Normally does not take any medications for this.  Food allergy: no Medication allergy: yes Eczema:yes as a child. History of recurrent infections suggestive of immunodeficency: no  Diagnostics: Skin Testing: Environmental allergy panel. Positive to tree pollen and dust mites. Negative test to: fire ant and common foods. Results discussed with patient/family.  Airborne Adult Perc - 01/07/20 1418    Time Antigen Placed 1418    Allergen Manufacturer Lavella Hammock    Location Back    Number of Test 59    Panel 1 Select    1. Control-Buffer 50% Glycerol Negative    2. Control-Histamine 1 mg/ml 2+    3. Albumin saline Negative    4. [place] Negative    5. [place] Negative    6. Johnson Negative    7. Howe Blue Negative     8. Meadow Fescue Negative    9. Perennial Rye Negative    10. Sweet Vernal Negative    11. Timothy Negative    12. Cocklebur Negative    13. Burweed Marshelder Negative    14. Ragweed, short Negative    15. Ragweed, Giant Negative    16. Plantain,  English Negative    17. Lamb's Quarters Negative    18. Sheep Sorrell Negative    19. Rough Pigweed Negative    20. Marsh Elder, Rough Negative    21. Mugwort, Common Negative    22. Ash mix Negative    23. Birch mix Negative    24. Beech American Negative    25. Box, Elder 3+    26. Cedar, red Negative    27. Cottonwood, [place] Negative    28. Elm mix Negative    29. Hickory Negative    30. Maple mix Negative    31. Oak, [place] mix Negative    32. Pecan  Pollen Negative    33. Pine mix Negative    34. Sycamore Eastern Negative    35. [place], Black Pollen Negative    36. Alternaria alternata Negative    37. Cladosporium Herbarum Negative    38. Aspergillus mix Negative    39. Penicillium mix Negative    40. Bipolaris sorokiniana (Helminthosporium) Negative    41. Drechslera spicifera (Curvularia) Negative    42. Mucor plumbeus Negative    43. Fusarium moniliforme Negative    44. Aureobasidium pullulans (pullulara) Negative    45. Rhizopus oryzae Negative    46. Botrytis cinera Negative    47. Epicoccum nigrum Negative    48. Phoma betae Negative    49. Candida Albicans Negative    50. Trichophyton mentagrophytes Negative    51. Mite, D Farinae  5,000 AU/ml 2+    52. Mite, D Pteronyssinus  5,000 AU/ml 2+    53. Cat Hair 10,000 BAU/ml Negative    54.  Dog Epithelia Negative    55. Mixed Feathers Negative    56. Horse Epithelia Negative    57. Cockroach, German Negative    58. Mouse Negative    59. Tobacco Leaf Negative          Food Adult Perc - 01/07/20 1400    Time Antigen Placed 1418    Allergen Manufacturer Lavella Hammock    Location Back    Number of allergen test 10    1. Peanut Negative    2. Soybean Negative     3. Wheat Negative    4. Sesame Negative    5. Milk, cow Negative    6. Egg White, Chicken Negative    8. Shellfish Mix Negative    9. Fish Mix Negative    10. Cashew Negative    6. Other Negative   Catering manager          Past Medical History: Patient Active Problem List   Diagnosis Date Noted  . Hymenoptera allergy 01/07/2020  . Pruritus 01/07/2020  . Other allergic rhinitis 01/07/2020  . DJD (degenerative joint disease) 10/12/2016  . PCP NOTES >>>>>> 02/09/2015  . Liposarcoma ([place])   . Annual physical exam 01/02/2011  . NEOPLASMS UNSPEC NATURE BONE SOFT TISSUE&SKIN 06/27/2007  . Hypothyroidism 08/01/2006  . Hyperlipidemia 08/01/2006  . Essential hypertension 08/01/2006  . Osteoporosis 08/01/2006   Past Medical History:  Diagnosis Date  . Angio-edema   . Asthma   . Hyperlipidemia   . Hypertension   . Hypothyroidism   . Liposarcoma ([place]) 1972   r leg  . Osteoporosis   . SCC (squamous cell carcinoma)   . Urticaria    Past Surgical History: Past Surgical History:  Procedure Laterality Date  . ABDOMINAL HYSTERECTOMY     no oophorectomy  . BREAST LUMPECTOMY     x2 left breast  . R leg liposarcoma  1973  . SQUAMOUS CELL CARCINOMA EXCISION     RLE  . TONSILLECTOMY     Medication List:  Current Outpatient Medications  Medication Sig Dispense Refill  . amLODipine (NORVASC) 10 MG tablet Take 1 tablet (10 mg total) by mouth daily. 90 tablet 3  . aspirin 81 MG EC tablet Take 81 mg by mouth daily.      Marland Kitchen atorvastatin (LIPITOR) 20 MG tablet Take 1 tablet (20 mg total) by mouth daily. 90 tablet 1  . calcium citrate-vitamin D (CITRACAL+D) 315-200 MG-UNIT per tablet Take 1 tablet by mouth. 600 mg bid    .  cholecalciferol (VITAMIN D) 1000 units tablet Take 1,000 Units by mouth daily.    Marland Kitchen EPINEPHrine 0.3 mg/0.3 mL IJ SOAJ injection Inject 0.3 mg into the muscle as needed for anaphylaxis. 2 each 0  . levothyroxine (SYNTHROID) 50 MCG tablet Take 1 tablet (50 mcg total) by  mouth daily before breakfast. 90 tablet 3  . Multiple Vitamin (MULTIVITAMIN) tablet Take 1 tablet by mouth daily.      . Omega-3 Fatty Acids (FISH OIL) 1200 MG CAPS Take 1 capsule by mouth 2 (two) times daily.    . predniSONE (DELTASONE) 20 MG tablet Take 2 tablets (40 mg total) by mouth daily. 2 tablet 0  . vitamin E 400 UNIT capsule Take 400 Units by mouth daily.     No current facility-administered medications for this visit.   Allergies: Allergies  Allergen Reactions  . Moxifloxacin Palpitations   Social History: Social History   Socioeconomic History  . Marital status: Married    Spouse name: Not on file  . Number of children: 2  . Years of education: Not on file  . Highest education level: Not on file  Occupational History  . Occupation: retired -- Art therapist, class Research scientist (medical)   Tobacco Use  . Smoking status: Never Smoker  . Smokeless tobacco: Never Used  Substance and Sexual Activity  . Alcohol use: No  . Drug use: No  . Sexual activity: Not Currently    Birth control/protection: Post-menopausal  Other Topics Concern  . Not on file  Social History Narrative   Lost a son when he was 43 y/o   Lives w/ husband    Social Determinants of Radio broadcast assistant Strain: Low Risk   . Difficulty of Paying Living Expenses: Not hard at all  Food Insecurity:   . Worried About Charity fundraiser in the Last Year: Not on file  . Ran Out of Food in the Last Year: Not on file  Transportation Needs: No Transportation Needs  . Lack of Transportation (Medical): No  . Lack of Transportation (Non-Medical): No  Physical Activity:   . Days of Exercise per Week: Not on file  . Minutes of Exercise per Session: Not on file  Stress:   . Feeling of Stress : Not on file  Social Connections:   . Frequency of Communication with Friends and Family: Not on file  . Frequency of Social Gatherings with Friends and Family: Not on file  . Attends Religious Services: Not on  file  . Active Member of Clubs or Organizations: Not on file  . Attends Archivist Meetings: Not on file  . Marital Status: Not on file   Lives in a house. Smoking: denies Occupation: retired  Programme researcher, broadcasting/film/video HistoryFreight forwarder in the house: no Charity fundraiser in the family room: yes Carpet in the bedroom: no Heating: heat pump Cooling: central Pet: no  Family History: Family History  Problem Relation Age of Onset  . Heart disease Mother        congenital heart defect  . Cancer Father        kidney cancer  . Cancer Brother        Brain Cancer  . Cancer Maternal Aunt        breast cancer  . Cancer Paternal Aunt        breast cancer  . Colon cancer Neg Hx   . Diabetes Neg Hx   . Allergic rhinitis Neg Hx   . Asthma Neg Hx   .  Eczema Neg Hx   . Urticaria Neg Hx    Review of Systems  Constitutional: Negative for appetite change, chills, fever and unexpected weight change.  HENT: Negative for congestion and rhinorrhea.   Eyes: Negative for itching.  Respiratory: Negative for cough, chest tightness, shortness of breath and wheezing.   Cardiovascular: Negative for chest pain.  Gastrointestinal: Negative for abdominal pain.  Genitourinary: Negative for difficulty urinating.  Skin: Positive for rash.  Allergic/Immunologic: Positive for environmental allergies.  Neurological: Negative for headaches.   Objective: Pulse 68   Temp 98.9 F (37.2 C) (Temporal)   Resp 16   Ht 4' 11.3" (1.506 m)   Wt 116 lb 12.8 oz (53 kg)   SpO2 99%   BMI 23.35 kg/m  Body mass index is 23.35 kg/m. Physical Exam Vitals and nursing note reviewed.  Constitutional:      Appearance: Normal appearance. She is well-developed.  HENT:     Head: Normocephalic and atraumatic.     Right Ear: Tympanic membrane and external ear normal.     Left Ear: Tympanic membrane and external ear normal.     Nose: Nose normal.     Mouth/Throat:     Mouth: Mucous membranes are moist.     Pharynx:  Oropharynx is clear.  Eyes:     Conjunctiva/sclera: Conjunctivae normal.  Cardiovascular:     Rate and Rhythm: Normal rate and regular rhythm.     Heart sounds: Normal heart sounds. No murmur heard.  No friction rub. No gallop.   Pulmonary:     Effort: Pulmonary effort is normal.     Breath sounds: Normal breath sounds. No wheezing, rhonchi or rales.  Musculoskeletal:     Cervical back: Neck supple.  Skin:    General: Skin is warm.     Findings: Rash present.     Comments: Small erythematous bump on the right forearm area.  Neurological:     Mental Status: She is alert and oriented to person, place, and time.  Psychiatric:        Behavior: Behavior normal.    The plan was reviewed with the patient/family, and all questions/concerned were addressed.  It was my pleasure to see Hoang today and participate in her care. Please feel free to contact me with any questions or concerns.  Sincerely,  Rexene Alberts, DO Allergy & Immunology  [facility] office: [place] office: [phone]

## 2020-01-07 ENCOUNTER — Other Ambulatory Visit: Payer: Self-pay

## 2020-01-07 ENCOUNTER — Encounter: Payer: Self-pay | Admitting: Allergy

## 2020-01-07 ENCOUNTER — Ambulatory Visit: Payer: Medicare HMO | Admitting: Allergy

## 2020-01-07 VITALS — HR 68 | Temp 98.9°F | Resp 16 | Ht 59.3 in | Wt 116.8 lb

## 2020-01-07 DIAGNOSIS — Z91038 Other insect allergy status: Secondary | ICD-10-CM | POA: Diagnosis not present

## 2020-01-07 DIAGNOSIS — J3089 Other allergic rhinitis: Secondary | ICD-10-CM

## 2020-01-07 DIAGNOSIS — J302 Other seasonal allergic rhinitis: Secondary | ICD-10-CM | POA: Insufficient documentation

## 2020-01-07 DIAGNOSIS — L299 Pruritus, unspecified: Secondary | ICD-10-CM

## 2020-01-07 NOTE — Assessment & Plan Note (Signed)
Anaphylactic reaction in the form of itching and feeling lightheaded and dizzy.  To possibly yellow jacket requiring epinephrine, Solu-Medrol and Benadryl.  Symptoms resolved within 1 hour. Previously stung by yellow jacket without any symptoms.  Possibly stung by fire ant which caused pruritic rash on her body for 1 day.  Today's skin testing showed: negative to fire ant.  Continue to avoid bee stings and fire ant.  For mild symptoms you can take over the counter antihistamines such as Benadryl and monitor symptoms closely. If symptoms worsen or if you have severe symptoms including breathing issues, throat closure, significant swelling, whole body hives, severe diarrhea and vomiting, lightheadedness then inject epinephrine and seek immediate medical care afterwards.  Demonstrated proper epinephrine use.  Emergency action plan given.   Get bloodwork for hymenoptera panel and fire ant and if positive will discuss starting on allergy injections.   If results are negative then will schedule for allergy skin testing in our [facility] office for hymenoptera panel. This is done once a month.

## 2020-01-07 NOTE — Patient Instructions (Addendum)
Today's skin testing showed: Positive to tree pollen and dust mites. All others were negative.  Bee sting reactions:  Continue to avoid bee stings and fire ant.  For mild symptoms you can take over the counter antihistamines such as Benadryl and monitor symptoms closely. If symptoms worsen or if you have severe symptoms including breathing issues, throat closure, significant swelling, whole body hives, severe diarrhea and vomiting, lightheadedness then inject epinephrine and seek immediate medical care afterwards.  Emergency action plan given.   Get bloodwork and if positive will discuss starting on allergy injections.   Itching/rash:  Monitor symptoms.  May use over the counter antihistamines such as Zyrtec (cetirizine) 10mg  daily as needed.  Avoid honey for now. Will get bloodwork for this.  See below for proper skin care.   Environmental allergies  Start environmental control measures as below.  May use over the counter antihistamines such as Zyrtec (cetirizine), Claritin (loratadine), Allegra (fexofenadine), or Xyzal (levocetirizine) daily as needed.  Follow up in 6 months or sooner if needed.   Skin care recommendations  Bath time: . Always use lukewarm water. AVOID very hot or cold water. Marland Kitchen Keep bathing time to 5-10 minutes. . Do NOT use bubble bath. . Use a mild soap and use just enough to wash the dirty areas. . Do NOT scrub skin vigorously.  . After bathing, pat dry your skin with a towel. Do NOT rub or scrub the skin.  Moisturizers and prescriptions:  . ALWAYS apply moisturizers immediately after bathing (within 3 minutes). This helps to lock-in moisture. . Use the moisturizer several times a day over the whole body. Kermit Balo summer moisturizers include: Aveeno, CeraVe, Cetaphil. Kermit Balo winter moisturizers include: Aquaphor, Vaseline, Cerave, Cetaphil, Eucerin, Vanicream. . When using moisturizers along with medications, the moisturizer should be applied about  one hour after applying the medication to prevent diluting effect of the medication or moisturize around where you applied the medications. When not using medications, the moisturizer can be continued twice daily as maintenance.  Laundry and clothing: . Avoid laundry products with added color or perfumes. . Use unscented hypo-allergenic laundry products such as Tide free, Cheer free & gentle, and All free and clear.  . If the skin still seems dry or sensitive, you can try double-rinsing the clothes. . Avoid tight or scratchy clothing such as wool. . Do not use fabric softeners or dyer sheets.  Reducing Pollen Exposure . Pollen seasons: trees (spring), grass (summer) and ragweed/weeds (fall). Marland Kitchen Keep windows closed in your home and car to lower pollen exposure.  Susa Simmonds air conditioning in the bedroom and throughout the house if possible.  . Avoid going out in dry windy days - especially early morning. . Pollen counts are highest between 5 - 10 AM and on dry, hot and windy days.  . Save outside activities for late afternoon or after a heavy rain, when pollen levels are lower.  . Avoid mowing of grass if you have grass pollen allergy. Marland Kitchen Be aware that pollen can also be transported indoors on people and pets.  . Dry your clothes in an automatic dryer rather than hanging them outside where they might collect pollen.  . Rinse hair and eyes before bedtime. Control of House Dust Mite Allergen . Dust mite allergens are a common trigger of allergy and asthma symptoms. While they can be found throughout the house, these microscopic creatures thrive in warm, humid environments such as bedding, upholstered furniture and carpeting. . Because so much  time is spent in the bedroom, it is essential to reduce mite levels there.  . Encase pillows, mattresses, and box springs in special allergen-proof fabric covers or airtight, zippered plastic covers.  . Bedding should be washed weekly in hot water (130 F)  and dried in a hot dryer. Allergen-proof covers are available for comforters and pillows that can't be regularly washed.  Wendee Copp the allergy-proof covers every few months. Minimize clutter in the bedroom. Keep pets out of the bedroom.  Marland Kitchen Keep humidity less than 50% by using a dehumidifier or air conditioning. You can buy a humidity measuring device called a hygrometer to monitor this.  . If possible, replace carpets with hardwood, linoleum, or washable area rugs. If that's not possible, vacuum frequently with a vacuum that has a HEPA filter. . Remove all upholstered furniture and non-washable window drapes from the bedroom. . Remove all non-washable stuffed toys from the bedroom.  Wash stuffed toys weekly.

## 2020-01-07 NOTE — Assessment & Plan Note (Signed)
Some rhinitis symptoms in the spring and fall for which she usually does not take any medications for.  Today's skin prick testing was positive to tree pollen and dust mites.  Start environmental control measures as below.  May use over the counter antihistamines such as Zyrtec (cetirizine), Claritin (loratadine), Allegra (fexofenadine), or Xyzal (levocetirizine) daily as needed.

## 2020-01-07 NOTE — Assessment & Plan Note (Signed)
Pruritic rash since above episode with no specific triggers noted.  There is concern whether honey or other items may be contributing to her symptoms.  Today's skin prick testing was negative to common foods.  There is no honey skin testing available and will get blood work for this.  Monitor symptoms.  Not sure what's causing the pruritic rash.   May use over the counter antihistamines such as Zyrtec (cetirizine) 10mg  daily as needed.  Avoid honey for now.  See below for proper skin care.

## 2020-01-09 DIAGNOSIS — Z1231 Encounter for screening mammogram for malignant neoplasm of breast: Secondary | ICD-10-CM | POA: Diagnosis not present

## 2020-01-09 LAB — HM MAMMOGRAPHY

## 2020-01-11 LAB — ALLERGEN HYMENOPTERA PANEL
Bumblebee: 0.96 kU/L — AB
Honeybee IgE: 1.22 kU/L — AB
Hornet, White Face, IgE: 14.2 kU/L — AB
Hornet, Yellow, IgE: 5.63 kU/L — AB
Paper Wasp IgE: 4.22 kU/L — AB
Yellow Jacket, IgE: 23.5 kU/L — AB

## 2020-01-11 LAB — ALLERGEN FIRE ANT: I070-IgE Fire Ant (Invicta): 13.7 kU/L — AB

## 2020-01-11 LAB — F247-IGE HONEY: F247-IgE Honey: 0.89 kU/L — AB

## 2020-01-11 LAB — TRYPTASE: Tryptase: 5.5 ug/L (ref 2.2–13.2)

## 2020-01-13 ENCOUNTER — Encounter: Payer: Self-pay | Admitting: Internal Medicine

## 2020-01-13 ENCOUNTER — Encounter: Payer: Self-pay | Admitting: Allergy

## 2020-01-13 NOTE — Telephone Encounter (Signed)
Misty Farrier, DO 6 minutes ago (9:06 AM)   Thank you for your sending the results of my tests. I have questions about any interaction of the allergy injections with my current medications, the schedule of the injections and if there will be any reactions. Could I have the injections at the pharmacy or at the [facility] office? Do you still recommend that I take a Zyrtec each day or just as needed?    [name]

## 2020-01-14 ENCOUNTER — Encounter: Payer: Self-pay | Admitting: Internal Medicine

## 2020-01-16 ENCOUNTER — Encounter: Payer: Self-pay | Admitting: Internal Medicine

## 2020-01-21 DIAGNOSIS — Z1211 Encounter for screening for malignant neoplasm of colon: Secondary | ICD-10-CM | POA: Diagnosis not present

## 2020-01-21 LAB — FECAL OCCULT BLOOD, GUAIAC: Fecal Occult Blood: NEGATIVE

## 2020-01-26 ENCOUNTER — Encounter: Payer: Self-pay | Admitting: Internal Medicine

## 2020-02-04 ENCOUNTER — Other Ambulatory Visit: Payer: Self-pay

## 2020-02-04 ENCOUNTER — Other Ambulatory Visit: Payer: Self-pay | Admitting: Allergy

## 2020-02-04 ENCOUNTER — Ambulatory Visit (INDEPENDENT_AMBULATORY_CARE_PROVIDER_SITE_OTHER): Payer: Medicare HMO

## 2020-02-04 DIAGNOSIS — Z91038 Other insect allergy status: Secondary | ICD-10-CM | POA: Diagnosis not present

## 2020-02-04 NOTE — Progress Notes (Signed)
Immunotherapy   Patient Details  Name: FIA HEBERT MRN: [id number] Date of Birth: 09-11-1945  02/04/2020  [name] pt here to start venom injetions mv, wasp, hb Following schedule: weekly  Frequency:weekly Epi-Pen:yes Consent signed and patient instructions given.   Felipa Emory 02/04/2020, 8:44 AM

## 2020-02-11 ENCOUNTER — Ambulatory Visit (INDEPENDENT_AMBULATORY_CARE_PROVIDER_SITE_OTHER): Payer: Medicare HMO

## 2020-02-11 ENCOUNTER — Other Ambulatory Visit: Payer: Self-pay

## 2020-02-11 DIAGNOSIS — Z91038 Other insect allergy status: Secondary | ICD-10-CM | POA: Diagnosis not present

## 2020-02-17 ENCOUNTER — Other Ambulatory Visit: Payer: Self-pay | Admitting: Internal Medicine

## 2020-02-18 ENCOUNTER — Ambulatory Visit (INDEPENDENT_AMBULATORY_CARE_PROVIDER_SITE_OTHER): Payer: Medicare HMO

## 2020-02-18 ENCOUNTER — Other Ambulatory Visit: Payer: Self-pay

## 2020-02-18 DIAGNOSIS — Z91038 Other insect allergy status: Secondary | ICD-10-CM | POA: Diagnosis not present

## 2020-02-18 DIAGNOSIS — T63441D Toxic effect of venom of bees, accidental (unintentional), subsequent encounter: Secondary | ICD-10-CM

## 2020-02-25 ENCOUNTER — Encounter: Payer: Self-pay | Admitting: Internal Medicine

## 2020-02-25 ENCOUNTER — Other Ambulatory Visit: Payer: Self-pay

## 2020-02-25 ENCOUNTER — Ambulatory Visit: Payer: Self-pay

## 2020-02-25 ENCOUNTER — Ambulatory Visit (INDEPENDENT_AMBULATORY_CARE_PROVIDER_SITE_OTHER): Payer: Medicare HMO | Admitting: Internal Medicine

## 2020-02-25 VITALS — BP 136/70 | HR 62 | Temp 98.3°F | Resp 16 | Ht 59.0 in | Wt 115.4 lb

## 2020-02-25 DIAGNOSIS — Z0000 Encounter for general adult medical examination without abnormal findings: Secondary | ICD-10-CM | POA: Diagnosis not present

## 2020-02-25 DIAGNOSIS — E039 Hypothyroidism, unspecified: Secondary | ICD-10-CM

## 2020-02-25 DIAGNOSIS — Z1211 Encounter for screening for malignant neoplasm of colon: Secondary | ICD-10-CM

## 2020-02-25 DIAGNOSIS — I10 Essential (primary) hypertension: Secondary | ICD-10-CM | POA: Diagnosis not present

## 2020-02-25 DIAGNOSIS — M810 Age-related osteoporosis without current pathological fracture: Secondary | ICD-10-CM | POA: Diagnosis not present

## 2020-02-25 DIAGNOSIS — E785 Hyperlipidemia, unspecified: Secondary | ICD-10-CM

## 2020-02-25 LAB — COMPREHENSIVE METABOLIC PANEL
ALT: 19 U/L (ref 0–35)
AST: 22 U/L (ref 0–37)
Albumin: 4.7 g/dL (ref 3.5–5.2)
Alkaline Phosphatase: 45 U/L (ref 39–117)
BUN: 11 mg/dL (ref 6–23)
CO2: 31 mEq/L (ref 19–32)
Calcium: 9.6 mg/dL (ref 8.4–10.5)
Chloride: 101 mEq/L (ref 96–112)
Creatinine, Ser: 0.73 mg/dL (ref 0.40–1.20)
GFR: 80.97 mL/min (ref >60.00)
Glucose, Bld: 86 mg/dL (ref 70–99)
Potassium: 4.4 mEq/L (ref 3.5–5.1)
Sodium: 140 mEq/L (ref 135–145)
Total Bilirubin: 0.8 mg/dL (ref 0.2–1.2)
Total Protein: 7 g/dL (ref 6.0–8.3)

## 2020-02-25 LAB — CBC WITH DIFFERENTIAL/PLATELET
Basophils Absolute: 0.1 10*3/uL (ref 0.0–0.1)
Basophils Relative: 0.6 % (ref 0.0–3.0)
Eosinophils Absolute: 0.1 10*3/uL (ref 0.0–0.7)
Eosinophils Relative: 0.6 % (ref 0.0–5.0)
HCT: 43.9 % (ref 36.0–46.0)
Hemoglobin: 14.5 g/dL (ref 12.0–15.0)
Lymphocytes Relative: 24.3 % (ref 12.0–46.0)
Lymphs Abs: 2.1 10*3/uL (ref 0.7–4.0)
MCHC: 33.1 g/dL (ref 30.0–36.0)
MCV: 90.4 fl (ref 78.0–100.0)
Monocytes Absolute: 0.6 10*3/uL (ref 0.1–1.0)
Monocytes Relative: 6.6 % (ref 3.0–12.0)
Neutro Abs: 5.9 10*3/uL (ref 1.4–7.7)
Neutrophils Relative %: 67.9 % (ref 43.0–77.0)
Platelets: 316 10*3/uL (ref 150.0–400.0)
RBC: 4.86 Mil/uL (ref 3.87–5.11)
RDW: 13.6 % (ref 11.5–15.5)
WBC: 8.8 10*3/uL (ref 4.0–10.5)

## 2020-02-25 LAB — TSH: TSH: 1.29 u[IU]/mL (ref 0.35–4.50)

## 2020-02-25 LAB — LIPID PANEL
Cholesterol: 170 mg/dL (ref 0–200)
HDL: 65.3 mg/dL (ref >39.00)
LDL Cholesterol: 92 mg/dL (ref 0–99)
NonHDL: 104.93
Total CHOL/HDL Ratio: 3
Triglycerides: 67 mg/dL (ref 0.0–149.0)
VLDL: 13.4 mg/dL (ref 0.0–40.0)

## 2020-02-25 MED ORDER — DENOSUMAB 60 MG/ML ~~LOC~~ SOSY
60.0000 mg | PREFILLED_SYRINGE | Freq: Once | SUBCUTANEOUS | Status: AC
  Administered 2020-02-25: 60 mg via SUBCUTANEOUS

## 2020-02-25 NOTE — Progress Notes (Signed)
Subjective:    Patient ID: [name], female    DOB: 06-22-1945, 74 y.o.   MRN: [id number]  DOS:  02/25/2020 Type of visit - description: CPX In general feels okay. Multiple other issues discussed today.   Review of Systems Has mild urinary incontinence  Other than above, a 14 point review of systems is negative   A 14 point review of systems is negative   Past Medical History:  Diagnosis Date  . Angio-edema   . Asthma   . Hyperlipidemia   . Hypertension   . Hypothyroidism   . 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 Osteoporosis   . SCC (squamous cell carcinoma)   . Urticaria     Past Surgical History:  Procedure Laterality Date  . ABDOMINAL HYSTERECTOMY     no oophorectomy  . BREAST LUMPECTOMY     x2 left breast  . R leg liposarcoma  1973  . SQUAMOUS CELL CARCINOMA EXCISION     RLE  . TONSILLECTOMY      Allergies as of 02/25/2020      Reactions   Bee Venom Anaphylaxis   Wasp, yellow jacket, hornet   Moxifloxacin Palpitations      Medication List       Accurate as of February 25, 2020  5:04 PM. If you have any questions, ask your nurse or doctor.        STOP taking these medications   aspirin 81 MG EC tablet Stopped by: [name], MD     TAKE these medications   amLODipine 10 MG tablet Commonly known as: NORVASC Take 1 tablet (10 mg total) by mouth daily.   atorvastatin 20 MG tablet Commonly known as: LIPITOR Take 1 tablet (20 mg total) by mouth daily.   calcium citrate-vitamin D 315-200 MG-UNIT tablet Commonly known as: CITRACAL+D Take 1 tablet by mouth. 600 mg bid   cholecalciferol 1000 units tablet Commonly known as: VITAMIN D Take 1,000 Units by mouth daily.   EPINEPHrine 0.3 mg/0.3 mL Soaj injection Commonly known as: EPI-PEN Inject 0.3 mg into the muscle as needed for anaphylaxis.   Fish Oil 1200 MG Caps Take 1 capsule by mouth 2 (two) times daily.   levothyroxine 50 MCG tablet Commonly known as: SYNTHROID Take 1 tablet (50  mcg total) by mouth daily before breakfast.   multivitamin tablet Take 1 tablet by mouth daily.   vitamin E 180 MG (400 UNITS) capsule Take 400 Units by mouth daily.          Objective:   Physical Exam BP 136/70 (BP Location: Left Arm, Patient Position: Sitting, Cuff Size: Small)   Pulse 62   Temp 98.3 F (36.8 C) (Oral)   Resp 16   Ht 4\' 11"  (1.499 m)   Wt 115 lb 6 oz (52.3 kg)   SpO2 100%   BMI 23.30 kg/m  General: Well developed, NAD, BMI noted Neck: No  thyromegaly  HEENT:  Normocephalic . Face symmetric, atraumatic Lungs:  CTA B Normal respiratory effort, no intercostal retractions, no accessory muscle use. Heart: RRR,  no murmur.  Abdomen:  Not distended, soft, non-tender. No rebound or rigidity.   Lower extremities: no pretibial edema bilaterally  Skin: Exposed areas without rash. Not pale. Not jaundice Neurologic:  alert & oriented X3.  Speech normal, gait appropriate for age and unassisted Strength symmetric and appropriate for age.  Psych: Cognition and judgment appear intact.  Cooperative with normal attention span and concentration.  Behavior  appropriate. No anxious or depressed appearing.     Assessment     Assessment HTN Hyperlipidemia Hypothyroidism Osteoporosis --T score 2013: -2.6,  T score 02-2014  -2.5 after  Reclast x 3. Last reclast 05-2013. T score 02-2016: -2.4. T score -2.6 (04-2018), Rx Prolia.  --Normal Vit D 2014 SCC, skin cancer, right lower extremity, sees derm q 6 months Liposarcoma right leg 1972 (near ankle, posteriorly)  PLAN:  Here for CPX HTN: Good compliance with amlodipine, ambulatory BPs have been very good throughout the year except when she went to see another doctor (BP is in the 140s), and today: 160/80.   Repeated BP: 136/74, no change, reassess in 6 months High cholesterol: On Lipitor, check labs Hypothyroidism: On Synthroid, check labs. Osteoporosis: Prolia today, on OTC vitamin D, to discuss with her  allergist if she can get the bee venom today since she is getting Prolia. Urinary incontinence: She already d/w gyn and was Rx exercises  Aspirin: Okay to stop, age over 88, no known CAD/stroke. RTC 6 months   This visit occurred during the SARS-CoV-2 public health emergency.  Safety protocols were in place, including screening questions prior to the visit, additional usage of staff PPE, and extensive cleaning of exam room while observing appropriate contact time as indicated for disinfecting solutions.

## 2020-02-25 NOTE — Assessment & Plan Note (Signed)
-   Td 2012 - Pneumonia shot 2012, Prevnar 02-2015 - Zostavax 2008; s/p shingrixx 2 -COVID vaccines x3. - Had a flu shot -[facility]: 2011 colonoscopy normal Dr. Oletta Lamas; 3 options d/w extensively, elected GI referral, same MD that saw her husband -Female care: saw gyn since last cpx  [facility] November 2021 ([facility]) -Labs: CMP, FLP, CBC, TSH, -Lifestyle discussed

## 2020-02-25 NOTE — Assessment & Plan Note (Signed)
Here for CPX HTN: Good compliance with amlodipine, ambulatory BPs have been very good throughout the year except when she went to see another doctor (BP is in the 140s), and today: 160/80.   Repeated BP: 136/74, no change, reassess in 6 months High cholesterol: On Lipitor, check labs Hypothyroidism: On Synthroid, check labs. Osteoporosis: Prolia today, on OTC vitamin D, to discuss with her allergist if she can get the bee venom today since she is getting Prolia. Urinary incontinence: She already d/w gyn and was Rx exercises  Aspirin: Okay to stop, age over 90, no known CAD/stroke. RTC 6 months

## 2020-02-25 NOTE — Patient Instructions (Signed)
Check the  blood pressure 2 or 3 times a month  BP GOAL is between 110/65 and  135/85. If it is consistently higher or lower, let me know   GO TO THE LAB : Get the blood work     [place], [place] back for checkup in 6 months

## 2020-02-25 NOTE — Progress Notes (Signed)
proliaPre visit review using our clinic review tool, if applicable. No additional management support is needed unless otherwise documented below in the visit note.

## 2020-02-26 ENCOUNTER — Ambulatory Visit (INDEPENDENT_AMBULATORY_CARE_PROVIDER_SITE_OTHER): Payer: Medicare HMO

## 2020-02-26 DIAGNOSIS — T63441D Toxic effect of venom of bees, accidental (unintentional), subsequent encounter: Secondary | ICD-10-CM | POA: Diagnosis not present

## 2020-03-03 ENCOUNTER — Ambulatory Visit (INDEPENDENT_AMBULATORY_CARE_PROVIDER_SITE_OTHER): Payer: Medicare HMO

## 2020-03-03 DIAGNOSIS — T63441D Toxic effect of venom of bees, accidental (unintentional), subsequent encounter: Secondary | ICD-10-CM | POA: Diagnosis not present

## 2020-03-04 ENCOUNTER — Ambulatory Visit: Payer: Self-pay

## 2020-03-10 ENCOUNTER — Ambulatory Visit (INDEPENDENT_AMBULATORY_CARE_PROVIDER_SITE_OTHER): Payer: Medicare HMO | Admitting: *Deleted

## 2020-03-10 ENCOUNTER — Other Ambulatory Visit: Payer: Self-pay

## 2020-03-10 DIAGNOSIS — T63441D Toxic effect of venom of bees, accidental (unintentional), subsequent encounter: Secondary | ICD-10-CM

## 2020-03-11 ENCOUNTER — Encounter: Payer: Self-pay | Admitting: Internal Medicine

## 2020-03-17 ENCOUNTER — Ambulatory Visit (INDEPENDENT_AMBULATORY_CARE_PROVIDER_SITE_OTHER): Payer: Medicare HMO

## 2020-03-17 ENCOUNTER — Other Ambulatory Visit: Payer: Self-pay

## 2020-03-17 DIAGNOSIS — T63441D Toxic effect of venom of bees, accidental (unintentional), subsequent encounter: Secondary | ICD-10-CM | POA: Diagnosis not present

## 2020-03-24 ENCOUNTER — Ambulatory Visit (INDEPENDENT_AMBULATORY_CARE_PROVIDER_SITE_OTHER): Payer: Medicare HMO

## 2020-03-24 ENCOUNTER — Other Ambulatory Visit: Payer: Self-pay

## 2020-03-24 DIAGNOSIS — T63441D Toxic effect of venom of bees, accidental (unintentional), subsequent encounter: Secondary | ICD-10-CM

## 2020-03-29 ENCOUNTER — Other Ambulatory Visit: Payer: Self-pay | Admitting: Internal Medicine

## 2020-03-29 NOTE — Telephone Encounter (Signed)
E-scribing down. Rx printed, faxed to [facility].

## 2020-03-31 ENCOUNTER — Ambulatory Visit (INDEPENDENT_AMBULATORY_CARE_PROVIDER_SITE_OTHER): Payer: Medicare HMO

## 2020-03-31 ENCOUNTER — Other Ambulatory Visit: Payer: Self-pay

## 2020-03-31 DIAGNOSIS — T63441D Toxic effect of venom of bees, accidental (unintentional), subsequent encounter: Secondary | ICD-10-CM

## 2020-04-07 ENCOUNTER — Ambulatory Visit (INDEPENDENT_AMBULATORY_CARE_PROVIDER_SITE_OTHER): Payer: Medicare HMO

## 2020-04-07 DIAGNOSIS — T63441D Toxic effect of venom of bees, accidental (unintentional), subsequent encounter: Secondary | ICD-10-CM | POA: Diagnosis not present

## 2020-04-14 ENCOUNTER — Ambulatory Visit (INDEPENDENT_AMBULATORY_CARE_PROVIDER_SITE_OTHER): Payer: Medicare HMO

## 2020-04-14 ENCOUNTER — Other Ambulatory Visit: Payer: Self-pay

## 2020-04-14 DIAGNOSIS — T63441D Toxic effect of venom of bees, accidental (unintentional), subsequent encounter: Secondary | ICD-10-CM

## 2020-04-15 ENCOUNTER — Ambulatory Visit: Payer: Self-pay

## 2020-04-16 ENCOUNTER — Ambulatory Visit: Payer: Self-pay | Admitting: *Deleted

## 2020-04-21 ENCOUNTER — Ambulatory Visit (INDEPENDENT_AMBULATORY_CARE_PROVIDER_SITE_OTHER): Payer: Medicare HMO

## 2020-04-21 ENCOUNTER — Other Ambulatory Visit: Payer: Self-pay

## 2020-04-21 DIAGNOSIS — T63441D Toxic effect of venom of bees, accidental (unintentional), subsequent encounter: Secondary | ICD-10-CM | POA: Diagnosis not present

## 2020-04-27 ENCOUNTER — Encounter: Payer: Self-pay | Admitting: Internal Medicine

## 2020-04-28 ENCOUNTER — Other Ambulatory Visit: Payer: Self-pay

## 2020-04-28 ENCOUNTER — Ambulatory Visit (INDEPENDENT_AMBULATORY_CARE_PROVIDER_SITE_OTHER): Payer: Medicare HMO

## 2020-04-28 DIAGNOSIS — T63441D Toxic effect of venom of bees, accidental (unintentional), subsequent encounter: Secondary | ICD-10-CM

## 2020-05-05 ENCOUNTER — Ambulatory Visit: Payer: Self-pay

## 2020-05-05 ENCOUNTER — Ambulatory Visit (INDEPENDENT_AMBULATORY_CARE_PROVIDER_SITE_OTHER): Payer: Medicare HMO

## 2020-05-05 ENCOUNTER — Other Ambulatory Visit: Payer: Self-pay

## 2020-05-05 DIAGNOSIS — T63441D Toxic effect of venom of bees, accidental (unintentional), subsequent encounter: Secondary | ICD-10-CM | POA: Diagnosis not present

## 2020-05-05 DIAGNOSIS — H43811 Vitreous degeneration, right eye: Secondary | ICD-10-CM | POA: Diagnosis not present

## 2020-05-05 DIAGNOSIS — Z1211 Encounter for screening for malignant neoplasm of colon: Secondary | ICD-10-CM | POA: Diagnosis not present

## 2020-05-05 DIAGNOSIS — H40023 Open angle with borderline findings, high risk, bilateral: Secondary | ICD-10-CM | POA: Diagnosis not present

## 2020-05-05 DIAGNOSIS — H02831 Dermatochalasis of right upper eyelid: Secondary | ICD-10-CM | POA: Diagnosis not present

## 2020-05-05 DIAGNOSIS — H2513 Age-related nuclear cataract, bilateral: Secondary | ICD-10-CM | POA: Diagnosis not present

## 2020-05-12 ENCOUNTER — Other Ambulatory Visit: Payer: Self-pay

## 2020-05-12 ENCOUNTER — Ambulatory Visit (INDEPENDENT_AMBULATORY_CARE_PROVIDER_SITE_OTHER): Payer: Medicare HMO | Admitting: *Deleted

## 2020-05-12 DIAGNOSIS — T63441D Toxic effect of venom of bees, accidental (unintentional), subsequent encounter: Secondary | ICD-10-CM | POA: Diagnosis not present

## 2020-05-17 ENCOUNTER — Other Ambulatory Visit: Payer: Self-pay | Admitting: Internal Medicine

## 2020-05-17 DIAGNOSIS — Z01812 Encounter for preprocedural laboratory examination: Secondary | ICD-10-CM | POA: Diagnosis not present

## 2020-05-18 ENCOUNTER — Other Ambulatory Visit: Payer: Self-pay | Admitting: Internal Medicine

## 2020-05-18 MED ORDER — AMLODIPINE BESYLATE 10 MG PO TABS
10.0000 mg | ORAL_TABLET | Freq: Every day | ORAL | 1 refills | Status: DC
Start: 2020-05-18 — End: 2020-11-16

## 2020-05-18 MED ORDER — LEVOTHYROXINE SODIUM 50 MCG PO TABS: 50.0000 ug | ORAL_TABLET | Freq: Every day | ORAL | 1 refills | Status: DC

## 2020-05-19 ENCOUNTER — Ambulatory Visit (INDEPENDENT_AMBULATORY_CARE_PROVIDER_SITE_OTHER): Payer: Medicare HMO | Admitting: *Deleted

## 2020-05-19 ENCOUNTER — Other Ambulatory Visit: Payer: Self-pay

## 2020-05-19 DIAGNOSIS — T63441D Toxic effect of venom of bees, accidental (unintentional), subsequent encounter: Secondary | ICD-10-CM | POA: Diagnosis not present

## 2020-05-20 DIAGNOSIS — K635 Polyp of colon: Secondary | ICD-10-CM | POA: Diagnosis not present

## 2020-05-20 DIAGNOSIS — K644 Residual hemorrhoidal skin tags: Secondary | ICD-10-CM | POA: Diagnosis not present

## 2020-05-20 DIAGNOSIS — Z1211 Encounter for screening for malignant neoplasm of colon: Secondary | ICD-10-CM | POA: Diagnosis not present

## 2020-05-20 DIAGNOSIS — K5730 Diverticulosis of large intestine without perforation or abscess without bleeding: Secondary | ICD-10-CM | POA: Diagnosis not present

## 2020-05-20 DIAGNOSIS — K648 Other hemorrhoids: Secondary | ICD-10-CM | POA: Diagnosis not present

## 2020-05-20 LAB — HM COLONOSCOPY

## 2020-05-25 DIAGNOSIS — K635 Polyp of colon: Secondary | ICD-10-CM | POA: Diagnosis not present

## 2020-05-26 ENCOUNTER — Other Ambulatory Visit: Payer: Self-pay

## 2020-05-26 ENCOUNTER — Ambulatory Visit (INDEPENDENT_AMBULATORY_CARE_PROVIDER_SITE_OTHER): Payer: Medicare HMO

## 2020-05-26 DIAGNOSIS — T63441D Toxic effect of venom of bees, accidental (unintentional), subsequent encounter: Secondary | ICD-10-CM

## 2020-06-01 ENCOUNTER — Encounter: Payer: Self-pay | Admitting: Internal Medicine

## 2020-06-02 ENCOUNTER — Other Ambulatory Visit: Payer: Self-pay

## 2020-06-02 ENCOUNTER — Ambulatory Visit (INDEPENDENT_AMBULATORY_CARE_PROVIDER_SITE_OTHER): Payer: Medicare HMO

## 2020-06-02 ENCOUNTER — Encounter: Payer: Self-pay | Admitting: Internal Medicine

## 2020-06-02 DIAGNOSIS — T63441D Toxic effect of venom of bees, accidental (unintentional), subsequent encounter: Secondary | ICD-10-CM

## 2020-06-03 ENCOUNTER — Ambulatory Visit (INDEPENDENT_AMBULATORY_CARE_PROVIDER_SITE_OTHER): Payer: Medicare HMO

## 2020-06-03 VITALS — Ht 59.0 in | Wt 115.0 lb

## 2020-06-03 DIAGNOSIS — Z0000 Encounter for general adult medical examination without abnormal findings: Secondary | ICD-10-CM

## 2020-06-03 DIAGNOSIS — Z780 Asymptomatic menopausal state: Secondary | ICD-10-CM | POA: Diagnosis not present

## 2020-06-03 NOTE — Patient Instructions (Signed)
[name] , Thank you for taking time to complete your Medicare Wellness Visit. I appreciate your ongoing commitment to your health goals. Please review the following plan we discussed and let me know if I can assist you in the future.   Screening recommendations/referrals: Colonoscopy: No longer required Mammogram: Completed 01/09/2020-Due 01/08/2021 Bone Density: Ordered today. Someone will be calling you to schedule. Recommended yearly ophthalmology/optometry visit for glaucoma screening and checkup Recommended yearly dental visit for hygiene and checkup  Vaccinations: Influenza vaccine: Up to date Pneumococcal vaccine: Completed vaccines Tdap vaccine: Up to date-01/01/2021 Shingles vaccine: Completed vaccines   Covid-19:Completed vacines  Advanced directives: Please bring a copy for your chart  Conditions/risks identified: See problem list  Next appointment: Follow up in one year for your annual wellness visit 06/09/2021 @ 11:00   Preventive Care 65 Years and Older, Female Preventive care refers to lifestyle choices and visits with your health care provider that can promote health and wellness. What does preventive care include?  A yearly physical exam. This is also called an annual well check.  Dental exams once or twice a year.  Routine eye exams. Ask your health care provider how often you should have your eyes checked.  Personal lifestyle choices, including:  Daily care of your teeth and gums.  Regular physical activity.  Eating a healthy diet.  Avoiding tobacco and drug use.  Limiting alcohol use.  Practicing safe sex.  Taking low-dose aspirin every day.  Taking vitamin and mineral supplements as recommended by your health care provider. What happens during an annual well check? The services and screenings done by your health care provider during your annual well check will depend on your age, overall health, lifestyle risk factors, and family history of  disease. Counseling  Your health care provider may ask you questions about your:  Alcohol use.  Tobacco use.  Drug use.  Emotional well-being.  Home and relationship well-being.  Sexual activity.  Eating habits.  History of falls.  Memory and ability to understand (cognition).  Work and work Statistician.  Reproductive health. Screening  You may have the following tests or measurements:  Height, weight, and BMI.  Blood pressure.  Lipid and cholesterol levels. These may be checked every 5 years, or more frequently if you are over 25 years old.  Skin check.  Lung cancer screening. You may have this screening every year starting at age 28 if you have a 30-pack-year history of smoking and currently smoke or have quit within the past 15 years.  Fecal occult blood test (FOBT) of the stool. You may have this test every year starting at age 38.  Flexible sigmoidoscopy or colonoscopy. You may have a sigmoidoscopy every 5 years or a colonoscopy every 10 years starting at age 31.  Hepatitis C blood test.  Hepatitis B blood test.  Sexually transmitted disease (STD) testing.  Diabetes screening. This is done by checking your blood sugar (glucose) after you have not eaten for a while (fasting). You may have this done every 1-3 years.  Bone density scan. This is done to screen for osteoporosis. You may have this done starting at age 41.  Mammogram. This may be done every 1-2 years. Talk to your health care provider about how often you should have regular mammograms. Talk with your health care provider about your test results, treatment options, and if necessary, the need for more tests. Vaccines  Your health care provider may recommend certain vaccines, such as:  Influenza vaccine. This  is recommended every year.  Tetanus, diphtheria, and acellular pertussis (Tdap, Td) vaccine. You may need a Td booster every 10 years.  Zoster vaccine. You may need this after age  83.  Pneumococcal 13-valent conjugate (PCV13) vaccine. One dose is recommended after age 49.  Pneumococcal polysaccharide (PPSV23) vaccine. One dose is recommended after age 21. Talk to your health care provider about which screenings and vaccines you need and how often you need them. This information is not intended to replace advice given to you by your health care provider. Make sure you discuss any questions you have with your health care provider. Document Released: 03/19/2015 Document Revised: 11/10/2015 Document Reviewed: 12/22/2014 Elsevier Interactive Patient Education  2017 [place] Prevention in the Home Falls can cause injuries. They can happen to people of all ages. There are many things you can do to make your home safe and to help prevent falls. What can I do on the outside of my home?  Regularly fix the edges of walkways and driveways and fix any cracks.  Remove anything that might make you trip as you walk through a door, such as a raised step or threshold.  Trim any bushes or trees on the path to your home.  Use bright outdoor lighting.  Clear any walking paths of anything that might make someone trip, such as rocks or tools.  Regularly check to see if handrails are loose or broken. Make sure that both sides of any steps have handrails.  Any raised decks and porches should have guardrails on the edges.  Have any leaves, snow, or ice cleared regularly.  Use sand or salt on walking paths during winter.  Clean up any spills in your garage right away. This includes oil or grease spills. What can I do in the bathroom?  Use night lights.  Install grab bars by the toilet and in the tub and shower. Do not use towel bars as grab bars.  Use non-skid mats or decals in the tub or shower.  If you need to sit down in the shower, use a plastic, non-slip stool.  Keep the floor dry. Clean up any water that spills on the floor as soon as it happens.  Remove  soap buildup in the tub or shower regularly.  Attach bath mats securely with double-sided non-slip rug tape.  Do not have throw rugs and other things on the floor that can make you trip. What can I do in the bedroom?  Use night lights.  Make sure that you have a light by your bed that is easy to reach.  Do not use any sheets or blankets that are too big for your bed. They should not hang down onto the floor.  Have a firm chair that has side arms. You can use this for support while you get dressed.  Do not have throw rugs and other things on the floor that can make you trip. What can I do in the kitchen?  Clean up any spills right away.  Avoid walking on wet floors.  Keep items that you use a lot in easy-to-reach places.  If you need to reach something above you, use a strong step stool that has a grab bar.  Keep electrical cords out of the way.  Do not use floor polish or wax that makes floors slippery. If you must use wax, use non-skid floor wax.  Do not have throw rugs and other things on the floor that can make you  trip. What can I do with my stairs?  Do not leave any items on the stairs.  Make sure that there are handrails on both sides of the stairs and use them. Fix handrails that are broken or loose. Make sure that handrails are as long as the stairways.  Check any carpeting to make sure that it is firmly attached to the stairs. Fix any carpet that is loose or worn.  Avoid having throw rugs at the top or bottom of the stairs. If you do have throw rugs, attach them to the floor with carpet tape.  Make sure that you have a light switch at the top of the stairs and the bottom of the stairs. If you do not have them, ask someone to add them for you. What else can I do to help prevent falls?  Wear shoes that:  Do not have high heels.  Have rubber bottoms.  Are comfortable and fit you well.  Are closed at the toe. Do not wear sandals.  If you use a  stepladder:  Make sure that it is fully opened. Do not climb a closed stepladder.  Make sure that both sides of the stepladder are locked into place.  Ask someone to hold it for you, if possible.  Clearly mark and make sure that you can see:  Any grab bars or handrails.  First and last steps.  Where the edge of each step is.  Use tools that help you move around (mobility aids) if they are needed. These include:  Canes.  Walkers.  Scooters.  Crutches.  Turn on the lights when you go into a dark area. Replace any light bulbs as soon as they burn out.  Set up your furniture so you have a clear path. Avoid moving your furniture around.  If any of your floors are uneven, fix them.  If there are any pets around you, be aware of where they are.  Review your medicines with your doctor. Some medicines can make you feel dizzy. This can increase your chance of falling. Ask your doctor what other things that you can do to help prevent falls. This information is not intended to replace advice given to you by your health care provider. Make sure you discuss any questions you have with your health care provider. Document Released: 12/17/2008 Document Revised: 07/29/2015 Document Reviewed: 03/27/2014 Elsevier Interactive Patient Education  2017 [employer].

## 2020-06-03 NOTE — Progress Notes (Signed)
Subjective:   [name] is a 75 y.o. female who presents for Medicare Annual (Subsequent) preventive examination.  I connected with [name] today by telephone and verified that I am speaking with the correct person using two identifiers. Location patient: home Location provider: work Persons participating in the virtual visit: patient, Marine scientist.    I discussed the limitations, risks, security and privacy concerns of performing an evaluation and management service by telephone and the availability of in person appointments. I also discussed with the patient that there may be a patient responsible charge related to this service. The patient expressed understanding and verbally consented to this telephonic visit.    Interactive audio and video telecommunications were attempted between this provider and patient, however failed, due to patient having technical difficulties OR patient did not have access to video capability.  We continued and completed visit with audio only.  Some vital signs may be absent or patient reported.   Time Spent with patient on telephone encounter: 30 minutes   Review of Systems     Cardiac Risk Factors include: advanced age (>74men, >60 women);hypertension;dyslipidemia     Objective:    Today's Vitals   06/03/20 1359  Weight: 115 lb (52.2 kg)  Height: 4\' 11"  (1.499 m)   Body mass index is 23.23 kg/m.  Advanced Directives 06/03/2020 12/10/2019 04/14/2019 04/08/2018 04/06/2017 04/04/2016 03/31/2015  Does Patient Have a Medical Advance Directive? Yes Yes Yes Yes Yes Yes Yes  Type of Paramedic of [place];Living will [place];Living will [place];Living will [place];Living will Living will [place];Living will [place];Living will;Out of facility DNR (pink MOST or yellow form)  Does patient want to make changes to medical advance directive? - - No -  Patient declined No - Patient declined - - No - Patient declined  Copy of [place] in Chart? No - copy requested - No - copy requested No - copy requested No - copy requested No - copy requested No - copy requested    Current Medications (verified) Outpatient Encounter Medications as of 06/03/2020  Medication Sig  . amLODipine (NORVASC) 10 MG tablet Take 1 tablet (10 mg total) by mouth daily.  Marland Kitchen atorvastatin (LIPITOR) 20 MG tablet Take 1 tablet (20 mg total) by mouth daily.  . calcium citrate-vitamin D (CITRACAL+D) 315-200 MG-UNIT per tablet Take 1 tablet by mouth. 600 mg bid  . cholecalciferol (VITAMIN D) 1000 units tablet Take 1,000 Units by mouth daily.  Marland Kitchen levothyroxine (SYNTHROID) 50 MCG tablet Take 1 tablet (50 mcg total) by mouth daily before breakfast.  . Multiple Vitamin (MULTIVITAMIN) tablet Take 1 tablet by mouth daily.  . Omega-3 Fatty Acids (FISH OIL) 1200 MG CAPS Take 1 capsule by mouth 2 (two) times daily.  . vitamin E 400 UNIT capsule Take 400 Units by mouth daily.  Marland Kitchen EPINEPHrine 0.3 mg/0.3 mL IJ SOAJ injection Inject 0.3 mg into the muscle as needed for anaphylaxis. (Patient not taking: No sig reported)   No facility-administered encounter medications on file as of 06/03/2020.    Allergies (verified) Bee venom and Moxifloxacin   History: Past Medical History:  Diagnosis Date  . Angio-edema   . Asthma   . Hyperlipidemia   . Hypertension   . Hypothyroidism   . 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 Osteoporosis   . SCC (squamous cell carcinoma)   . Urticaria    Past  Surgical History:  Procedure Laterality Date  . ABDOMINAL HYSTERECTOMY     no oophorectomy  . BREAST LUMPECTOMY     x2 left breast  . R leg liposarcoma  1973  . SQUAMOUS CELL CARCINOMA EXCISION     RLE  . TONSILLECTOMY     Family History  Problem Relation Age of Onset  . Heart disease Mother        congenital heart defect  . Cancer Father        kidney cancer  . Cancer  Brother        Brain Cancer  . Cancer Maternal Aunt        breast cancer  . Cancer Paternal Aunt        breast cancer  . Colon cancer Neg Hx   . Diabetes Neg Hx   . Allergic rhinitis Neg Hx   . Asthma Neg Hx   . Eczema Neg Hx   . Urticaria Neg Hx    Social History   Socioeconomic History  . Marital status: Married    Spouse name: Not on file  . Number of children: 2  . Years of education: Not on file  . Highest education level: Not on file  Occupational History  . Occupation: retired -- Art therapist, class Research scientist (medical)   Tobacco Use  . Smoking status: Never Smoker  . Smokeless tobacco: Never Used  Substance and Sexual Activity  . Alcohol use: No  . Drug use: No  . Sexual activity: Not Currently    Birth control/protection: Post-menopausal  Other Topics Concern  . Not on file  Social History Narrative   Lost a son when he was 69 y/o   Lives w/ husband    Social Determinants of Radio broadcast assistant Strain: Low Risk   . Difficulty of Paying Living Expenses: Not hard at all  Food Insecurity: No Food Insecurity  . Worried About Charity fundraiser in the Last Year: Never true  . Ran Out of Food in the Last Year: Never true  Transportation Needs: No Transportation Needs  . Lack of Transportation (Medical): No  . Lack of Transportation (Non-Medical): No  Physical Activity: Sufficiently Active  . Days of Exercise per Week: 5 days  . Minutes of Exercise per Session: 60 min  Stress: No Stress Concern Present  . Feeling of Stress : Not at all  Social Connections: Socially Integrated  . Frequency of Communication with Friends and Family: More than three times a week  . Frequency of Social Gatherings with Friends and Family: More than three times a week  . Attends Religious Services: 1 to 4 times per year  . Active Member of Clubs or Organizations: Yes  . Attends Archivist Meetings: 1 to 4 times per year  . Marital Status: Married    Tobacco  Counseling Counseling given: Not Answered   Clinical Intake:  Pre-visit preparation completed: Yes  Pain : No/denies pain     Nutritional Status: BMI of 19-24  Normal Nutritional Risks: None Diabetes: No  How often do you need to have someone help you when you read instructions, pamphlets, or other written materials from your doctor or pharmacy?: 1 - Never  Diabetic?No  Interpreter Needed?: No  Information entered by :: Caroleen Hamman LPN   Activities of Daily Living In your present state of health, do you have any difficulty performing the following activities: 06/03/2020  Hearing? N  Vision? N  Difficulty concentrating or making  decisions? N  Walking or climbing stairs? N  Dressing or bathing? N  Doing errands, shopping? N  Preparing Food and eating ? N  Using the Toilet? N  In the past six months, have you accidently leaked urine? Y  Comment occasionally  Do you have problems with loss of bowel control? Y  Comment occasionally  Managing your Medications? N  Managing your Finances? N  Housekeeping or managing your Housekeeping? N  Some recent data might be hidden    Patient Care Team: Colon Branch, MD as PCP - General (Internal Medicine) Jari Pigg, MD as Consulting Physician (Dermatology) Shawnie Dapper, DO as Consulting Physician (Optometry) Elsie Saas, MD as Consulting Physician (Orthopedic Surgery) Beshears, Dorie Rank, DMD as Consulting Physician (Dentistry) Lorretta Harp, MD as Consulting Physician (Cardiology)  Indicate any recent Medical Services you may have received from other than [facility] providers in the past year (date may be approximate).     Assessment:   This is a routine wellness examination for Angla.  Hearing/Vision screen  Hearing Screening   125Hz  250Hz  500Hz  1000Hz  2000Hz  3000Hz  4000Hz  6000Hz  8000Hz   Right ear:           Left ear:           Comments: Mild hearing loss  Vision Screening Comments: Wears glasses Last eye exam-  05/05/2020- Dr. Jerline Pain  Dietary issues and exercise activities discussed: Current Exercise Habits: Home exercise routine, Type of exercise: Other - see comments (eliptical & rowing machine), Time (Minutes): 60, Frequency (Times/Week): 5, Weekly Exercise (Minutes/Week): 300, Exercise limited by: None identified  Goals    . Patient Stated     Maintain current healthy lifestyle       Depression Screen PHQ 2/9 Scores 06/03/2020 02/25/2020 04/14/2019 04/08/2018 02/15/2018 04/06/2017 04/04/2016  PHQ - 2 Score 1 0 0 0 2 0 1  PHQ- 9 Score - - - - 5 - -    Fall Risk Fall Risk  06/03/2020 02/25/2020 04/14/2019 04/08/2018 04/06/2017  Falls in the past year? 0 0 0 0 No  Number falls in past yr: 0 0 0 - -  Injury with Fall? 0 0 0 - -  Follow up Falls prevention discussed - Education provided;Falls prevention discussed - -    FALL RISK PREVENTION PERTAINING TO THE HOME:  Any stairs in or around the home? No  Home free of loose throw rugs in walkways, pet beds, electrical cords, etc? Yes  Adequate lighting in your home to reduce risk of falls? Yes   ASSISTIVE DEVICES UTILIZED TO PREVENT FALLS:  Life alert? No  Use of a cane, walker or w/c? No  Grab bars in the bathroom? Yes  Shower chair or bench in shower? No  Elevated toilet seat or a handicapped toilet? No   TIMED UP AND GO:  Was the test performed? No . Phone visit   Cognitive Function:Normal cognitive status assessed by this Nurse Health Advisor. No abnormalities found.   MMSE - Mini Mental State Exam 03/31/2015  Orientation to time 5  Orientation to Place 5  Registration 3  Attention/ Calculation 5  Recall 3  Language- name 2 objects 2  Language- repeat 1  Language- follow 3 step command 3  Language- read & follow direction 1  Write a sentence 1  Copy design 0  Total score 29        Immunizations Immunization History  Administered Date(s) Administered  . Influenza Split 01/03/2012, 12/03/2017, 12/05/2019  . Influenza Whole  01/07/2007, 11/27/2007, 03/26/2008, 11/12/2008, 11/04/2009  . Influenza, High Dose Seasonal PF 11/29/2018  . Influenza,inj,Quad PF,6+ Mos 01/28/2014  . Influenza-Unspecified 12/23/2014, 11/27/2015, 12/09/2016  . PFIZER(Purple Top)SARS-COV-2 Vaccination 04/12/2019, 05/07/2019, 12/03/2019  . Pneumococcal Conjugate-13 02/09/2015  . Pneumococcal Polysaccharide-23 01/02/2011  . Tdap 01/02/2011  . Zoster 02/07/2007  . Zoster Recombinat (Shingrix) 04/18/2017, 07/07/2017    TDAP status: Up to date  Flu Vaccine status: Up to date  Pneumococcal vaccine status: Up to date  Covid-19 vaccine status: Completed vaccines  Qualifies for Shingles Vaccine? No   Zostavax completed Yes   Shingrix Completed?: Yes  Screening Tests Health Maintenance  Topic Date Due  . COVID-19 Vaccine (4 - Booster for Pfizer series) 06/01/2020  . TETANUS/TDAP  01/01/2021  . MAMMOGRAM  01/08/2021  . INFLUENZA VACCINE  Completed  . DEXA SCAN  Completed  . Hepatitis C Screening  Completed  . PNA vac Low Risk Adult  Completed  . HPV VACCINES  Aged Out    Health Maintenance  Health Maintenance Due  Topic Date Due  . COVID-19 Vaccine (4 - Booster for Pfizer series) 06/01/2020    Colorectal cancer screening: Type of screening: Colonoscopy. Completed 05/20/2020. Repeat every 10 years  Mammogram status: Completed 01/09/2020-Bilateral. Repeat every year  Bone Density status: Ordered today. Pt provided with contact info and advised to call to schedule appt.  Lung Cancer Screening: (Low Dose CT Chest recommended if Age 17-80 years, 30 pack-year currently smoking OR have quit w/in 15years.) does not qualify.    Additional Screening:  Hepatitis C Screening:  Completed 02/09/2015  Vision Screening: Recommended annual ophthalmology exams for early detection of glaucoma and other disorders of the eye. Is the patient up to date with their annual eye exam?  Yes  Who is the provider or what is the name of the office in  which the patient attends annual eye exams? Dr. Jerline Pain   Dental Screening: Recommended annual dental exams for proper oral hygiene  Community Resource Referral / Chronic Care Management: CRR required this visit?  No   CCM required this visit?  No      Plan:     I have personally reviewed and noted the following in the patient's chart:   . Medical and social history . Use of alcohol, tobacco or illicit drugs  . Current medications and supplements . Functional ability and status . Nutritional status . Physical activity . Advanced directives . List of other physicians . Hospitalizations, surgeries, and ER visits in previous 12 months . Vitals . Screenings to include cognitive, depression, and falls . Referrals and appointments  In addition, I have reviewed and discussed with patient certain preventive protocols, quality metrics, and best practice recommendations. A written personalized care plan for preventive services as well as general preventive health recommendations were provided to patient.   Due to this being a telephonic visit, the after visit summary with patients personalized plan was offered to patient via mail or my-chart.  Patient would like to access on my-chart.   Marta Antu, LPN   1/61/0960  Nurse Health Advisor   Nurse Notes: None

## 2020-06-09 ENCOUNTER — Other Ambulatory Visit: Payer: Self-pay

## 2020-06-09 ENCOUNTER — Ambulatory Visit (INDEPENDENT_AMBULATORY_CARE_PROVIDER_SITE_OTHER): Payer: Medicare HMO | Admitting: *Deleted

## 2020-06-09 DIAGNOSIS — T63441D Toxic effect of venom of bees, accidental (unintentional), subsequent encounter: Secondary | ICD-10-CM

## 2020-06-16 ENCOUNTER — Ambulatory Visit (INDEPENDENT_AMBULATORY_CARE_PROVIDER_SITE_OTHER): Payer: Medicare HMO | Admitting: *Deleted

## 2020-06-16 ENCOUNTER — Other Ambulatory Visit: Payer: Self-pay

## 2020-06-16 DIAGNOSIS — T63441D Toxic effect of venom of bees, accidental (unintentional), subsequent encounter: Secondary | ICD-10-CM | POA: Diagnosis not present

## 2020-06-23 ENCOUNTER — Ambulatory Visit: Payer: Self-pay

## 2020-06-23 ENCOUNTER — Ambulatory Visit (INDEPENDENT_AMBULATORY_CARE_PROVIDER_SITE_OTHER): Payer: Medicare HMO

## 2020-06-23 ENCOUNTER — Other Ambulatory Visit: Payer: Self-pay

## 2020-06-23 DIAGNOSIS — T63441D Toxic effect of venom of bees, accidental (unintentional), subsequent encounter: Secondary | ICD-10-CM | POA: Diagnosis not present

## 2020-06-30 ENCOUNTER — Ambulatory Visit (INDEPENDENT_AMBULATORY_CARE_PROVIDER_SITE_OTHER): Payer: Medicare HMO | Admitting: *Deleted

## 2020-06-30 ENCOUNTER — Other Ambulatory Visit: Payer: Self-pay

## 2020-06-30 DIAGNOSIS — T63441D Toxic effect of venom of bees, accidental (unintentional), subsequent encounter: Secondary | ICD-10-CM

## 2020-07-07 ENCOUNTER — Ambulatory Visit: Payer: Medicare HMO | Admitting: Allergy

## 2020-07-07 ENCOUNTER — Ambulatory Visit (INDEPENDENT_AMBULATORY_CARE_PROVIDER_SITE_OTHER): Payer: Medicare HMO

## 2020-07-07 ENCOUNTER — Other Ambulatory Visit: Payer: Self-pay

## 2020-07-07 ENCOUNTER — Encounter: Payer: Self-pay | Admitting: Allergy

## 2020-07-07 VITALS — BP 134/64 | HR 66 | Temp 98.6°F | Resp 20 | Ht 59.0 in | Wt 118.0 lb

## 2020-07-07 DIAGNOSIS — J302 Other seasonal allergic rhinitis: Secondary | ICD-10-CM

## 2020-07-07 DIAGNOSIS — T63441D Toxic effect of venom of bees, accidental (unintentional), subsequent encounter: Secondary | ICD-10-CM

## 2020-07-07 DIAGNOSIS — J3089 Other allergic rhinitis: Secondary | ICD-10-CM

## 2020-07-07 DIAGNOSIS — T781XXD Other adverse food reactions, not elsewhere classified, subsequent encounter: Secondary | ICD-10-CM

## 2020-07-07 DIAGNOSIS — Z91038 Other insect allergy status: Secondary | ICD-10-CM

## 2020-07-07 NOTE — Patient Instructions (Addendum)
Bee sting reactions:  Continue to avoid bee stings and fire ant.  Continue injections.   Once you are on every 4 weeks will start on the fire ant and environmental injections as well which will be weekly.    For mild symptoms you can take over the counter antihistamines such as Benadryl and monitor symptoms closely. If symptoms worsen or if you have severe symptoms including breathing issues, throat closure, significant swelling, whole body hives, severe diarrhea and vomiting, lightheadedness then inject epinephrine and seek immediate medical care afterwards.  Emergency action plan in place.   Honey:   Avoid honey for now.   Will discuss re-introduction at the next visit.   Environmental allergies  2021 skin testing positive to tree pollen and dust mites.   Continue environmental control measures as below.  May use over the counter antihistamines such as Zyrtec (cetirizine), Claritin (loratadine), Allegra (fexofenadine), or Xyzal (levocetirizine) daily as needed.  Follow up in 6 months or sooner if needed.   Reducing Pollen Exposure . Pollen seasons: trees (spring), grass (summer) and ragweed/weeds (fall). Marland Kitchen Keep windows closed in your home and car to lower pollen exposure.  Susa Simmonds air conditioning in the bedroom and throughout the house if possible.  . Avoid going out in dry windy days - especially early morning. . Pollen counts are highest between 5 - 10 AM and on dry, hot and windy days.  . Save outside activities for late afternoon or after a heavy rain, when pollen levels are lower.  . Avoid mowing of grass if you have grass pollen allergy. Marland Kitchen Be aware that pollen can also be transported indoors on people and pets.  . Dry your clothes in an automatic dryer rather than hanging them outside where they might collect pollen.  . Rinse hair and eyes before bedtime. Control of House Dust Mite Allergen . Dust mite allergens are a common trigger of allergy and asthma symptoms.  While they can be found throughout the house, these microscopic creatures thrive in warm, humid environments such as bedding, upholstered furniture and carpeting. . Because so much time is spent in the bedroom, it is essential to reduce mite levels there.  . Encase pillows, mattresses, and box springs in special allergen-proof fabric covers or airtight, zippered plastic covers.  . Bedding should be washed weekly in hot water (130 F) and dried in a hot dryer. Allergen-proof covers are available for comforters and pillows that can't be regularly washed.  Wendee Copp the allergy-proof covers every few months. Minimize clutter in the bedroom. Keep pets out of the bedroom.  Marland Kitchen Keep humidity less than 50% by using a dehumidifier or air conditioning. You can buy a humidity measuring device called a hygrometer to monitor this.  . If possible, replace carpets with hardwood, linoleum, or washable area rugs. If that's not possible, vacuum frequently with a vacuum that has a HEPA filter. . Remove all upholstered furniture and non-washable window drapes from the bedroom. . Remove all non-washable stuffed toys from the bedroom.  Wash stuffed toys weekly.

## 2020-07-07 NOTE — Assessment & Plan Note (Signed)
Past history - Anaphylactic reaction in the form of itching and feeling lightheaded and dizzy.  To possibly yellow jacket requiring epinephrine, Solu-Medrol and Benadryl.  Symptoms resolved within 1 hour. Previously stung by yellow jacket without any symptoms.  Possibly stung by fire ant which caused pruritic rash on her body for 1 day. 2021 skin testing showed: negative to fire ant. Bloodwork positive to honeybee, white face hornet, yellow jacket, wasp, yellow hornet, bumblebee and fire ant.  Interim history - started AIT on 02/04/2020 (mv, wasp, hb) with localized reactions. No stings since the last visit.   Continue to avoid bee stings and fire ant.  Continue injections - given today.   Once on maintenance dose will start on the fire ant and environmental injections as well which will be weekly.   For mild symptoms you can take over the counter antihistamines such as Benadryl and monitor symptoms closely. If symptoms worsen or if you have severe symptoms including breathing issues, throat closure, significant swelling, whole body hives, severe diarrhea and vomiting, lightheadedness then inject epinephrine and seek immediate medical care afterwards.  Emergency action plan in place.

## 2020-07-07 NOTE — Assessment & Plan Note (Signed)
Past history - 2021 skin prick testing was positive to tree pollen and dust mites. Interim history - not spending much time outdoors now.  Continue environmental control measures as below.  May use over the counter antihistamines such as Zyrtec (cetirizine), Claritin (loratadine), Allegra (fexofenadine), or Xyzal (levocetirizine) daily as needed.  Start AIT for environmental allergies once on maintenance dose on hymenoptera.

## 2020-07-07 NOTE — Progress Notes (Signed)
Follow Up Note  RE: [name] MRN: [id number] DOB: Mar 29, 1945 Date of Office Visit: 07/07/2020  Referring provider: Colon Branch, MD Primary care provider: Colon Branch, MD  Chief Complaint: Allergic Rhinitis   History of Present Illness: I had the pleasure of seeing [name] for a follow up visit at the [facility] on 07/07/2020. She is a 75 y.o. female, who is being followed for hymenoptera allergy on AIT, allergic rhinitis. Her previous allergy office visit was on 01/07/2020 with Dr. Maudie Mercury. Today is a regular follow up visit.  Hymenoptera allergy Doing well with the injections and has some localized reactions.  No stings since the last visit and has Epipen on hand if needed.  Avoiding honey for now as well.   Pruritus Improved.   Other allergic rhinitis Minimal symptoms and not taking any daily allergy medications. She is not spending too much time outdoors.   Assessment and Plan: [name] is a 75 y.o. female with: Hymenoptera allergy Past history - Anaphylactic reaction in the form of itching and feeling lightheaded and dizzy.  To possibly yellow jacket requiring epinephrine, Solu-Medrol and Benadryl.  Symptoms resolved within 1 hour. Previously stung by yellow jacket without any symptoms.  Possibly stung by fire ant which caused pruritic rash on her body for 1 day. 2021 skin testing showed: negative to fire ant. Bloodwork positive to honeybee, white face hornet, yellow jacket, wasp, yellow hornet, bumblebee and fire ant.  Interim history - started AIT on 02/04/2020 (mv, wasp, hb) with localized reactions. No stings since the last visit.   Continue to avoid bee stings and fire ant.  Continue injections - given today.   Once on maintenance dose will start on the fire ant and environmental injections as well which will be weekly.   For mild symptoms you can take over the counter antihistamines such as Benadryl and monitor symptoms closely. If symptoms worsen or if  you have severe symptoms including breathing issues, throat closure, significant swelling, whole body hives, severe diarrhea and vomiting, lightheadedness then inject epinephrine and seek immediate medical care afterwards.  Emergency action plan in place.   Seasonal and perennial allergic rhinitis Past history - 2021 skin prick testing was positive to tree pollen and dust mites. Interim history - not spending much time outdoors now.  Continue environmental control measures as below.  May use over the counter antihistamines such as Zyrtec (cetirizine), Claritin (loratadine), Allegra (fexofenadine), or Xyzal (levocetirizine) daily as needed.  Start AIT for environmental allergies once on maintenance dose on hymenoptera.   Other adverse food reactions, not elsewhere classified, subsequent encounter  Continue to avoid honey for now.  Will discuss re-introduction at the next visit.   Return in about 6 months (around 01/07/2021).  No orders of the defined types were placed in this encounter.  Lab Orders  No laboratory test(s) ordered today    Diagnostics: None.  Medication List:  Current Outpatient Medications  Medication Sig Dispense Refill  . amLODipine (NORVASC) 10 MG tablet Take 1 tablet (10 mg total) by mouth daily. 90 tablet 1  . atorvastatin (LIPITOR) 20 MG tablet Take 1 tablet (20 mg total) by mouth daily. 90 tablet 1  . calcium citrate-vitamin D (CITRACAL+D) 315-200 MG-UNIT per tablet Take 1 tablet by mouth. 600 mg bid    . cholecalciferol (VITAMIN D) 1000 units tablet Take 1,000 Units by mouth daily.    Marland Kitchen EPINEPHrine 0.3 mg/0.3 mL IJ SOAJ injection Inject 0.3 mg  into the muscle as needed for anaphylaxis. 2 each 0  . levothyroxine (SYNTHROID) 50 MCG tablet Take 1 tablet (50 mcg total) by mouth daily before breakfast. 90 tablet 1  . Multiple Vitamin (MULTIVITAMIN) tablet Take 1 tablet by mouth daily.    . Omega-3 Fatty Acids (FISH OIL) 1200 MG CAPS Take 1 capsule by mouth 2  (two) times daily.    . vitamin E 400 UNIT capsule Take 400 Units by mouth daily.     No current facility-administered medications for this visit.   Allergies: Allergies  Allergen Reactions  . Bee Venom Anaphylaxis    Wasp, yellow jacket, hornet  . Moxifloxacin Palpitations   I reviewed her past medical history, social history, family history, and environmental history and no significant changes have been reported from her previous visit.  Review of Systems  Constitutional: Negative for appetite change, chills, fever and unexpected weight change.  HENT: Negative for congestion and rhinorrhea.   Eyes: Negative for itching.  Respiratory: Negative for cough, chest tightness, shortness of breath and wheezing.   Cardiovascular: Negative for chest pain.  Gastrointestinal: Negative for abdominal pain.  Genitourinary: Negative for difficulty urinating.  Skin: Negative for rash.  Allergic/Immunologic: Positive for environmental allergies.  Neurological: Negative for headaches.   Objective: BP 134/64 (BP Location: Left Arm, Patient Position: Sitting, Cuff Size: Normal)   Pulse 66   Temp 98.6 F (37 C) (Tympanic)   Resp 20   Ht 4\' 11"  (1.499 m)   Wt 118 lb (53.5 kg)   SpO2 97%   BMI 23.83 kg/m  Body mass index is 23.83 kg/m. Physical Exam Vitals and nursing note reviewed.  Constitutional:      Appearance: Normal appearance. She is well-developed.  HENT:     Head: Normocephalic and atraumatic.     Right Ear: Tympanic membrane and external ear normal.     Left Ear: Tympanic membrane and external ear normal.     Nose: Nose normal.     Mouth/Throat:     Mouth: Mucous membranes are moist.     Pharynx: Oropharynx is clear.  Eyes:     Conjunctiva/sclera: Conjunctivae normal.  Cardiovascular:     Rate and Rhythm: Normal rate and regular rhythm.     Heart sounds: Normal heart sounds. No murmur heard. No friction rub. No gallop.   Pulmonary:     Effort: Pulmonary effort is  normal.     Breath sounds: Normal breath sounds. No wheezing, rhonchi or rales.  Musculoskeletal:     Cervical back: Neck supple.  Skin:    General: Skin is warm.     Findings: No rash.  Neurological:     Mental Status: She is alert and oriented to person, place, and time.  Psychiatric:        Behavior: Behavior normal.    Previous notes and tests were reviewed. The plan was reviewed with the patient/family, and all questions/concerned were addressed.  It was my pleasure to see [name] today and participate in her care. Please feel free to contact me with any questions or concerns.  Sincerely,  Rexene Alberts, DO Allergy & Immunology  [facility] office: [place] office: [phone]

## 2020-07-07 NOTE — Assessment & Plan Note (Signed)
   Continue to avoid honey for now.  Will discuss re-introduction at the next visit.

## 2020-07-12 ENCOUNTER — Ambulatory Visit: Payer: Medicare HMO | Admitting: Allergy

## 2020-07-14 ENCOUNTER — Ambulatory Visit (INDEPENDENT_AMBULATORY_CARE_PROVIDER_SITE_OTHER): Payer: Medicare HMO

## 2020-07-14 ENCOUNTER — Other Ambulatory Visit: Payer: Self-pay

## 2020-07-14 DIAGNOSIS — T63441D Toxic effect of venom of bees, accidental (unintentional), subsequent encounter: Secondary | ICD-10-CM

## 2020-07-21 ENCOUNTER — Ambulatory Visit (INDEPENDENT_AMBULATORY_CARE_PROVIDER_SITE_OTHER): Payer: Medicare HMO

## 2020-07-21 ENCOUNTER — Other Ambulatory Visit: Payer: Self-pay

## 2020-07-21 DIAGNOSIS — T63441D Toxic effect of venom of bees, accidental (unintentional), subsequent encounter: Secondary | ICD-10-CM | POA: Diagnosis not present

## 2020-07-28 ENCOUNTER — Other Ambulatory Visit: Payer: Self-pay

## 2020-07-28 ENCOUNTER — Ambulatory Visit (INDEPENDENT_AMBULATORY_CARE_PROVIDER_SITE_OTHER): Payer: Medicare HMO

## 2020-07-28 DIAGNOSIS — T63441D Toxic effect of venom of bees, accidental (unintentional), subsequent encounter: Secondary | ICD-10-CM | POA: Diagnosis not present

## 2020-08-04 ENCOUNTER — Other Ambulatory Visit: Payer: Self-pay

## 2020-08-04 ENCOUNTER — Ambulatory Visit (INDEPENDENT_AMBULATORY_CARE_PROVIDER_SITE_OTHER): Payer: Medicare HMO

## 2020-08-04 DIAGNOSIS — T63441D Toxic effect of venom of bees, accidental (unintentional), subsequent encounter: Secondary | ICD-10-CM | POA: Diagnosis not present

## 2020-08-11 ENCOUNTER — Other Ambulatory Visit: Payer: Self-pay

## 2020-08-11 ENCOUNTER — Ambulatory Visit (INDEPENDENT_AMBULATORY_CARE_PROVIDER_SITE_OTHER): Payer: Medicare HMO

## 2020-08-11 DIAGNOSIS — T63441D Toxic effect of venom of bees, accidental (unintentional), subsequent encounter: Secondary | ICD-10-CM

## 2020-08-18 ENCOUNTER — Ambulatory Visit (INDEPENDENT_AMBULATORY_CARE_PROVIDER_SITE_OTHER): Payer: Medicare HMO

## 2020-08-18 ENCOUNTER — Other Ambulatory Visit: Payer: Self-pay

## 2020-08-18 DIAGNOSIS — T63441D Toxic effect of venom of bees, accidental (unintentional), subsequent encounter: Secondary | ICD-10-CM | POA: Diagnosis not present

## 2020-08-25 ENCOUNTER — Ambulatory Visit: Payer: Medicare HMO | Admitting: Internal Medicine

## 2020-08-25 ENCOUNTER — Ambulatory Visit: Payer: Self-pay

## 2020-08-30 ENCOUNTER — Telehealth: Payer: Self-pay

## 2020-08-30 ENCOUNTER — Other Ambulatory Visit: Payer: Self-pay

## 2020-08-30 ENCOUNTER — Ambulatory Visit (INDEPENDENT_AMBULATORY_CARE_PROVIDER_SITE_OTHER): Payer: Medicare HMO | Admitting: Internal Medicine

## 2020-08-30 VITALS — BP 110/60 | HR 70 | Temp 97.9°F | Resp 18 | Wt 118.0 lb

## 2020-08-30 DIAGNOSIS — E785 Hyperlipidemia, unspecified: Secondary | ICD-10-CM

## 2020-08-30 DIAGNOSIS — E039 Hypothyroidism, unspecified: Secondary | ICD-10-CM | POA: Diagnosis not present

## 2020-08-30 DIAGNOSIS — I10 Essential (primary) hypertension: Secondary | ICD-10-CM

## 2020-08-30 LAB — TSH: TSH: 0.89 u[IU]/mL (ref 0.35–4.50)

## 2020-08-30 NOTE — Progress Notes (Signed)
Subjective:    Patient ID: [name], female    DOB: 02/01/1946, 75 y.o.   MRN: [id number]  DOS:  08/30/2020 Type of visit - description: ROV Since the last office visit he is doing well. Going to the [facility] daily. Recently went to the mountains and they did some hiking.   Wt Readings from Last 3 Encounters:  08/30/20 118 lb (53.5 kg)  07/07/20 118 lb (53.5 kg)  06/03/20 115 lb (52.2 kg)    Review of Systems Denies chest pain or difficulty breathing No nausea, vomiting, diarrhea  Past Medical History:  Diagnosis Date   Angio-edema    Asthma    Hyperlipidemia    Hypertension    Hypothyroidism    Liposarcoma ([place]) 1972   r leg   Osteoporosis    SCC (squamous cell carcinoma)    Urticaria     Past Surgical History:  Procedure Laterality Date   ABDOMINAL HYSTERECTOMY     no oophorectomy   BREAST LUMPECTOMY     x2 left breast   R leg liposarcoma  1973   SQUAMOUS CELL CARCINOMA EXCISION     RLE   TONSILLECTOMY      Allergies as of 08/30/2020       Reactions   Bee Venom Anaphylaxis   Wasp, yellow jacket, hornet   Moxifloxacin Palpitations        Medication List        Accurate as of August 30, 2020 10:06 PM. If you have any questions, ask your nurse or doctor.          amLODipine 10 MG tablet Commonly known as: NORVASC Take 1 tablet (10 mg total) by mouth daily.   atorvastatin 20 MG tablet Commonly known as: LIPITOR Take 1 tablet (20 mg total) by mouth daily.   calcium citrate-vitamin D 315-200 MG-UNIT tablet Commonly known as: CITRACAL+D Take 1 tablet by mouth. 600 mg bid   cholecalciferol 1000 units tablet Commonly known as: VITAMIN D Take 1,000 Units by mouth daily.   EPINEPHrine 0.3 mg/0.3 mL Soaj injection Commonly known as: EPI-PEN Inject 0.3 mg into the muscle as needed for anaphylaxis.   Fish Oil 1200 MG Caps Take 1 capsule by mouth 2 (two) times daily.   levothyroxine 50 MCG tablet Commonly known as: SYNTHROID Take 1 tablet  (50 mcg total) by mouth daily before breakfast.   multivitamin tablet Take 1 tablet by mouth daily.   vitamin E 180 MG (400 UNITS) capsule Take 400 Units by mouth daily.           Objective:   Physical Exam BP 110/60 (BP Location: Left Arm, Patient Position: Sitting, Cuff Size: Normal)   Pulse 70   Temp 97.9 F (36.6 C) (Oral)   Resp 18   Wt 118 lb (53.5 kg)   SpO2 99%   BMI 23.83 kg/m  General:   Well developed, NAD, BMI noted. HEENT:  Normocephalic . Face symmetric, atraumatic Lungs:  CTA B Normal respiratory effort, no intercostal retractions, no accessory muscle use. Heart: RRR,  no murmur.  Lower extremities: no pretibial edema bilaterally  Skin: Not pale. Not jaundice Neurologic:  alert & oriented X3.  Speech normal, gait appropriate for age and unassisted Psych--  Cognition and judgment appear intact.  Cooperative with normal attention span and concentration.  Behavior appropriate. No anxious or depressed appearing.      Assessment     Assessment HTN Hyperlipidemia Hypothyroidism Osteoporosis --T score 2013: -2.6,  T score  02-2014  -2.5 after  Reclast x 3. Last reclast 05-2013. T score 02-2016: -2.4. T score -2.6 (04-2018), Rx Prolia.  --Normal Vit D 2014 SCC, skin cancer, right lower extremity, sees derm q 6 months Liposarcoma right leg 1972 (near ankle, posteriorly)  PLAN:  HTN: Continue amlodipine, BP today is great.  Rec to check at home. Hyperlipidemia, last LDL normal Hypothyroidism: Good compliance with levothyroxine, check a TSH Osteoporosis: Due for a Prolia, will arrange.  See ROS, she is very active Preventive care: Patient brought POA, will scan Rec a flu shot this fall RTC 6 months CPX   This visit occurred during the SARS-CoV-2 public health emergency.  Safety protocols were in place, including screening questions prior to the visit, additional usage of staff PPE, and extensive cleaning of exam room while observing appropriate  contact time as indicated for disinfecting solutions.

## 2020-08-30 NOTE — Patient Instructions (Signed)
Check the  blood pressure   BP GOAL is between 110/65 and  135/85. If it is consistently higher or lower, let me know    Get a flu shot this fall  GO TO THE LAB : Get the blood work     [place], [place] back for  a physical exam by 02-2021

## 2020-08-30 NOTE — Telephone Encounter (Signed)
LMOM instructing Pt to call office to schedule nurse visit for Prolia at her earliest convenience.

## 2020-08-30 NOTE — Assessment & Plan Note (Signed)
HTN: Continue amlodipine, BP today is great.  Rec to check at home. Hyperlipidemia, last LDL normal Hypothyroidism: Good compliance with levothyroxine, check a TSH Osteoporosis: Due for a Prolia, will arrange.  See ROS, she is very active Preventive care: Patient brought POA, will scan Rec a flu shot this fall RTC 6 months CPX

## 2020-08-31 ENCOUNTER — Ambulatory Visit (INDEPENDENT_AMBULATORY_CARE_PROVIDER_SITE_OTHER): Payer: Medicare HMO

## 2020-08-31 DIAGNOSIS — T63441D Toxic effect of venom of bees, accidental (unintentional), subsequent encounter: Secondary | ICD-10-CM | POA: Diagnosis not present

## 2020-09-07 DIAGNOSIS — L820 Inflamed seborrheic keratosis: Secondary | ICD-10-CM | POA: Diagnosis not present

## 2020-09-07 DIAGNOSIS — D2362 Other benign neoplasm of skin of left upper limb, including shoulder: Secondary | ICD-10-CM | POA: Diagnosis not present

## 2020-09-07 DIAGNOSIS — Z85828 Personal history of other malignant neoplasm of skin: Secondary | ICD-10-CM | POA: Diagnosis not present

## 2020-09-07 DIAGNOSIS — L578 Other skin changes due to chronic exposure to nonionizing radiation: Secondary | ICD-10-CM | POA: Diagnosis not present

## 2020-09-07 DIAGNOSIS — L814 Other melanin hyperpigmentation: Secondary | ICD-10-CM | POA: Diagnosis not present

## 2020-09-07 DIAGNOSIS — D2272 Melanocytic nevi of left lower limb, including hip: Secondary | ICD-10-CM | POA: Diagnosis not present

## 2020-09-07 DIAGNOSIS — D2261 Melanocytic nevi of right upper limb, including shoulder: Secondary | ICD-10-CM | POA: Diagnosis not present

## 2020-09-07 DIAGNOSIS — L570 Actinic keratosis: Secondary | ICD-10-CM | POA: Diagnosis not present

## 2020-09-07 DIAGNOSIS — L821 Other seborrheic keratosis: Secondary | ICD-10-CM | POA: Diagnosis not present

## 2020-09-07 DIAGNOSIS — D1724 Benign lipomatous neoplasm of skin and subcutaneous tissue of left leg: Secondary | ICD-10-CM | POA: Diagnosis not present

## 2020-09-07 DIAGNOSIS — D225 Melanocytic nevi of trunk: Secondary | ICD-10-CM | POA: Diagnosis not present

## 2020-09-09 ENCOUNTER — Other Ambulatory Visit: Payer: Self-pay

## 2020-09-09 ENCOUNTER — Ambulatory Visit (INDEPENDENT_AMBULATORY_CARE_PROVIDER_SITE_OTHER): Payer: Medicare HMO

## 2020-09-09 DIAGNOSIS — M810 Age-related osteoporosis without current pathological fracture: Secondary | ICD-10-CM

## 2020-09-09 MED ORDER — DENOSUMAB 60 MG/ML ~~LOC~~ SOSY
60.0000 mg | PREFILLED_SYRINGE | Freq: Once | SUBCUTANEOUS | Status: AC
  Administered 2020-09-09: 60 mg via SUBCUTANEOUS

## 2020-09-09 NOTE — Progress Notes (Addendum)
Patient in for Prolia injection per order from Dr. [name].   Given 60 ml SQ left arm. Patient tolerated well

## 2020-09-22 ENCOUNTER — Other Ambulatory Visit: Payer: Self-pay

## 2020-09-22 ENCOUNTER — Ambulatory Visit (INDEPENDENT_AMBULATORY_CARE_PROVIDER_SITE_OTHER): Payer: Medicare HMO

## 2020-09-22 DIAGNOSIS — T63441D Toxic effect of venom of bees, accidental (unintentional), subsequent encounter: Secondary | ICD-10-CM | POA: Diagnosis not present

## 2020-10-02 ENCOUNTER — Encounter: Payer: Self-pay | Admitting: Internal Medicine

## 2020-10-02 ENCOUNTER — Other Ambulatory Visit: Payer: Self-pay | Admitting: Internal Medicine

## 2020-10-04 MED ORDER — ATORVASTATIN CALCIUM 20 MG PO TABS: 20.0000 mg | ORAL_TABLET | Freq: Every day | ORAL | 1 refills | Status: DC

## 2020-10-04 MED ORDER — ATORVASTATIN CALCIUM 20 MG PO TABS
20.0000 mg | ORAL_TABLET | Freq: Every day | ORAL | 1 refills | Status: DC
Start: 2020-10-04 — End: 2021-04-11

## 2020-10-20 ENCOUNTER — Ambulatory Visit (INDEPENDENT_AMBULATORY_CARE_PROVIDER_SITE_OTHER): Payer: Medicare HMO

## 2020-10-20 ENCOUNTER — Other Ambulatory Visit: Payer: Self-pay

## 2020-10-20 DIAGNOSIS — T63441D Toxic effect of venom of bees, accidental (unintentional), subsequent encounter: Secondary | ICD-10-CM | POA: Diagnosis not present

## 2020-11-10 DIAGNOSIS — H5213 Myopia, bilateral: Secondary | ICD-10-CM | POA: Diagnosis not present

## 2020-11-10 DIAGNOSIS — H40023 Open angle with borderline findings, high risk, bilateral: Secondary | ICD-10-CM | POA: Diagnosis not present

## 2020-11-10 DIAGNOSIS — H2513 Age-related nuclear cataract, bilateral: Secondary | ICD-10-CM | POA: Diagnosis not present

## 2020-11-10 DIAGNOSIS — H43813 Vitreous degeneration, bilateral: Secondary | ICD-10-CM | POA: Diagnosis not present

## 2020-11-16 ENCOUNTER — Other Ambulatory Visit: Payer: Self-pay

## 2020-11-16 ENCOUNTER — Ambulatory Visit (INDEPENDENT_AMBULATORY_CARE_PROVIDER_SITE_OTHER): Payer: Medicare HMO

## 2020-11-16 ENCOUNTER — Other Ambulatory Visit: Payer: Self-pay | Admitting: Internal Medicine

## 2020-11-16 DIAGNOSIS — T63441D Toxic effect of venom of bees, accidental (unintentional), subsequent encounter: Secondary | ICD-10-CM

## 2020-11-17 ENCOUNTER — Ambulatory Visit: Payer: Medicare HMO

## 2020-11-18 IMAGING — DX DG TIBIA/FIBULA 2V*R*
2 series · 2 of 2 positions shown · non-contrast
Comparison: No recent.

CLINICAL DATA: History of liposarcoma removal years ago. Swelling
now noted.

EXAM:
RIGHT TIBIA AND FIBULA - 2 VIEW

[tibia ap]
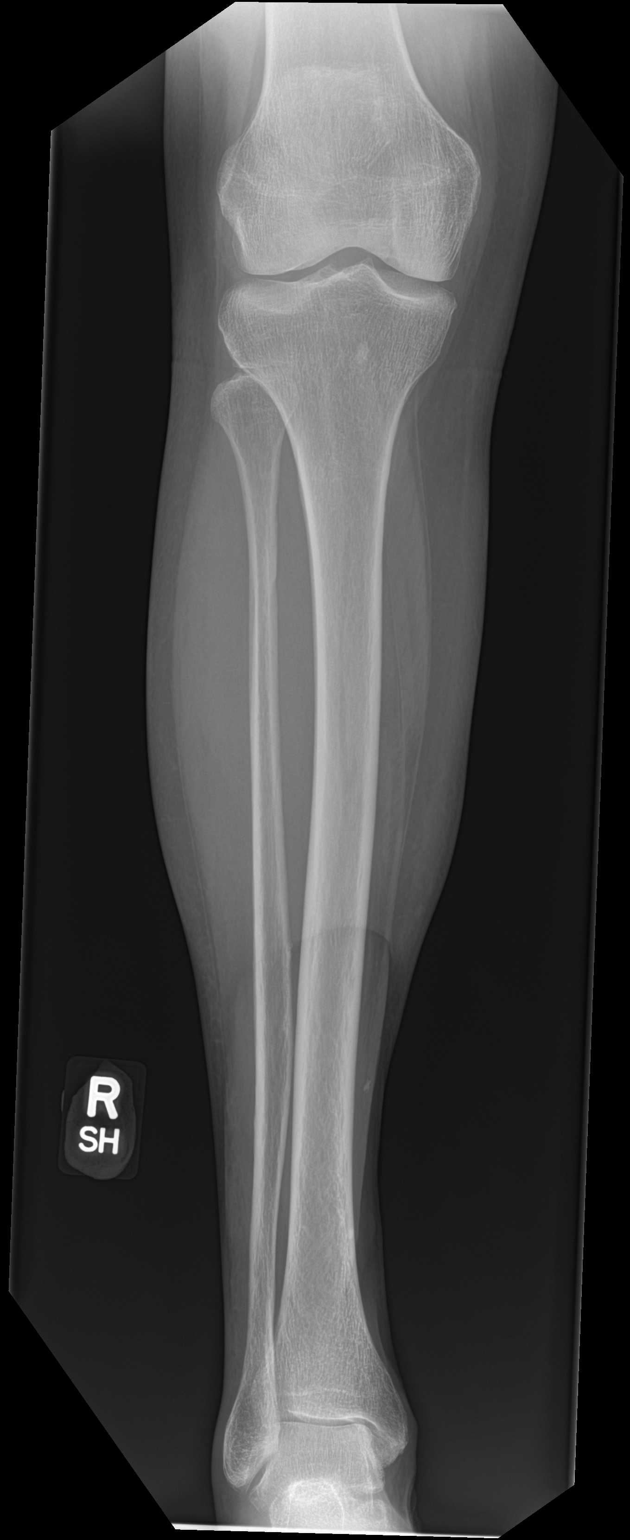

[tibia lat]
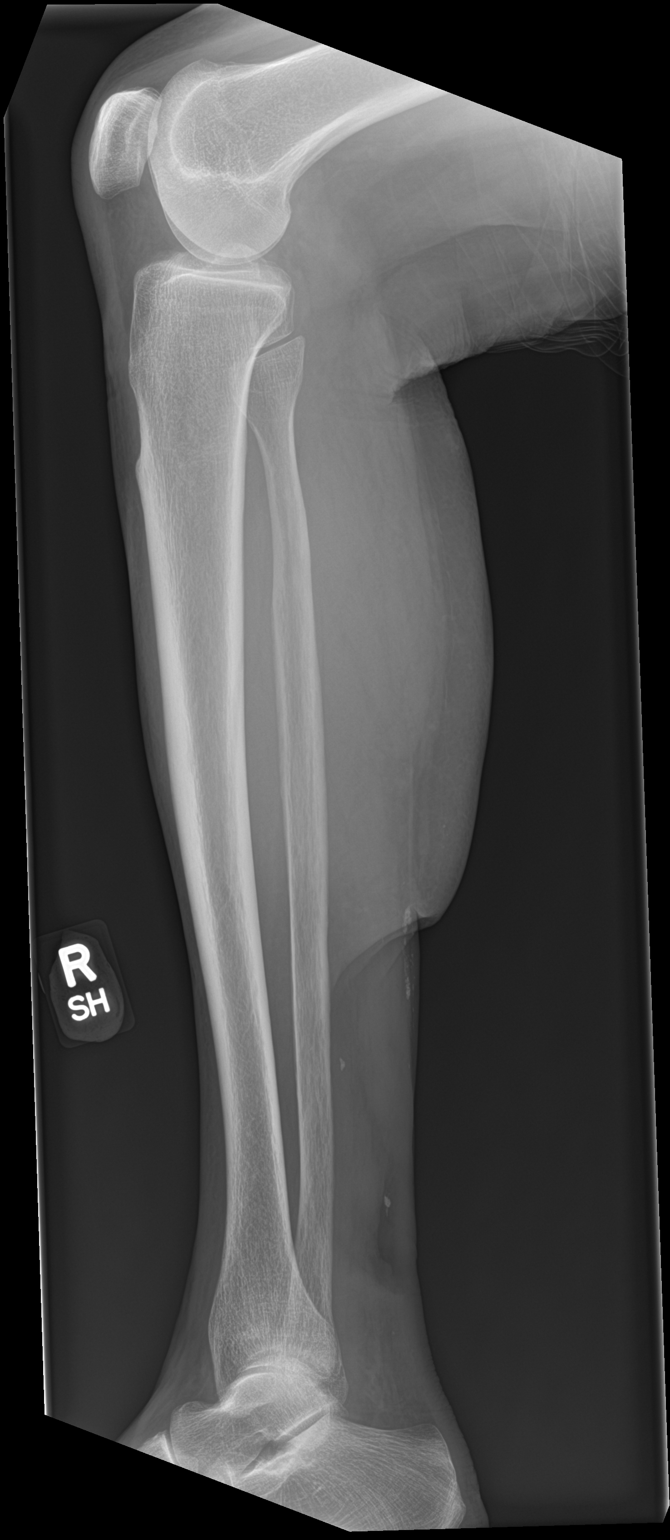

[2 of 2 positions shown; findings below may reference images not displayed]

FINDINGS: Soft tissue defect noted with patient's prior history of surgery.
Calcific changes noted about the soft tissue defect most likely
dystrophic calcifications. No focal bony abnormality identified. No
evidence of fracture or dislocation.
IMPRESSION: IMPRESSION
1. Soft tissue defect noted consistent patient's history of surgery.
Social did dystrophic calcification appears to be present.

2. No focal bony abnormality identified. No acute abnormality
identified.

## 2020-12-08 ENCOUNTER — Ambulatory Visit: Payer: Medicare HMO

## 2020-12-09 ENCOUNTER — Encounter: Payer: Self-pay | Admitting: Internal Medicine

## 2020-12-15 ENCOUNTER — Ambulatory Visit (INDEPENDENT_AMBULATORY_CARE_PROVIDER_SITE_OTHER): Payer: Medicare HMO

## 2020-12-15 ENCOUNTER — Other Ambulatory Visit: Payer: Self-pay

## 2020-12-15 DIAGNOSIS — T63441D Toxic effect of venom of bees, accidental (unintentional), subsequent encounter: Secondary | ICD-10-CM | POA: Diagnosis not present

## 2020-12-17 ENCOUNTER — Encounter: Payer: Self-pay | Admitting: Internal Medicine

## 2020-12-21 NOTE — Progress Notes (Deleted)
Follow Up Note  RE: [name] MRN: [id number] DOB: 1945-06-08 Date of Office Visit: 12/22/2020  Referring provider: Colon Branch, MD Primary care provider: Colon Branch, MD  Chief Complaint: No chief complaint on file.  History of Present Illness: I had the pleasure of seeing [name] for a follow up visit at the [facility] [place] [facility] [place] on 12/21/2020. She is a 75 y.o. female, who is being followed for hymenoptera allergy on AIT, allergic rhinitis and adverse food reaction. Her previous allergy office visit was on 07/07/2020 with Dr. Maudie Mercury. Today is a regular follow up visit.  Hymenoptera allergy Past history - Anaphylactic reaction in the form of itching and feeling lightheaded and dizzy.  To possibly yellow jacket requiring epinephrine, Solu-Medrol and Benadryl.  Symptoms resolved within 1 hour. Previously stung by yellow jacket without any symptoms.  Possibly stung by fire ant which caused pruritic rash on her body for 1 day. 2021 skin testing showed: negative to fire ant. Bloodwork positive to honeybee, white face hornet, yellow jacket, wasp, yellow hornet, bumblebee and fire ant.  Interim history - started AIT on 02/04/2020 (mv, wasp, hb) with localized reactions. No stings since the last visit.  Continue to avoid bee stings and fire ant. Continue injections - given today.  Once on maintenance dose will start on the fire ant and environmental injections as well which will be weekly.  For mild symptoms you can take over the counter antihistamines such as Benadryl and monitor symptoms closely. If symptoms worsen or if you have severe symptoms including breathing issues, throat closure, significant swelling, whole body hives, severe diarrhea and vomiting, lightheadedness then inject epinephrine and seek immediate medical care afterwards. Emergency action plan in place.    Seasonal and perennial allergic rhinitis Past history - 2021 skin prick testing was positive to tree pollen and dust  mites. Interim history - not spending much time outdoors now. Continue environmental control measures as below. May use over the counter antihistamines such as Zyrtec (cetirizine), Claritin (loratadine), Allegra (fexofenadine), or Xyzal (levocetirizine) daily as needed. Start AIT for environmental allergies once on maintenance dose on hymenoptera.    Other adverse food reactions, not elsewhere classified, subsequent encounter Continue to avoid honey for now. Will discuss re-introduction at the next visit.    Return in about 6 months (around 01/07/2021).  Assessment and Plan: [name] is a 75 y.o. female with: No problem-specific Assessment & Plan notes found for this encounter.  No follow-ups on file.  No orders of the defined types were placed in this encounter.  Lab Orders  No laboratory test(s) ordered today    Diagnostics: Spirometry:  Tracings reviewed. Her effort: {Blank single:19197::"Good reproducible efforts.","It was hard to get consistent efforts and there is a question as to whether this reflects a maximal maneuver.","Poor effort, data can not be interpreted."} FVC: ***L FEV1: ***L, ***% predicted FEV1/FVC ratio: ***% Interpretation: {Blank single:19197::"Spirometry consistent with mild obstructive disease","Spirometry consistent with moderate obstructive disease","Spirometry consistent with severe obstructive disease","Spirometry consistent with possible restrictive disease","Spirometry consistent with mixed obstructive and restrictive disease","Spirometry uninterpretable due to technique","Spirometry consistent with normal pattern","No overt abnormalities noted given today's efforts"}.  Please see scanned spirometry results for details.  Skin Testing: {Blank single:19197::"Select foods","Environmental allergy panel","Environmental allergy panel and select foods","Food allergy panel","None","Deferred due to recent antihistamines use"}. *** Results discussed with  patient/family.   Medication List:  Current Outpatient Medications  Medication Sig Dispense Refill   amLODipine (NORVASC) 10 MG tablet TAKE ONE TABLET  BY MOUTH DAILY 90 tablet 1   atorvastatin (LIPITOR) 20 MG tablet Take 1 tablet (20 mg total) by mouth daily. 90 tablet 1   calcium citrate-vitamin D (CITRACAL+D) 315-200 MG-UNIT per tablet Take 1 tablet by mouth. 600 mg bid     cholecalciferol (VITAMIN D) 1000 units tablet Take 1,000 Units by mouth daily.     EPINEPHrine 0.3 mg/0.3 mL IJ SOAJ injection Inject 0.3 mg into the muscle as needed for anaphylaxis. 2 each 0   levothyroxine (SYNTHROID) 50 MCG tablet TAKE ONE TABLET BY MOUTH DAILY BEFORE BREAKFAST 90 tablet 1   Multiple Vitamin (MULTIVITAMIN) tablet Take 1 tablet by mouth daily.     Omega-3 Fatty Acids (FISH OIL) 1200 MG CAPS Take 1 capsule by mouth 2 (two) times daily.     vitamin E 400 UNIT capsule Take 400 Units by mouth daily.     No current facility-administered medications for this visit.   Allergies: Allergies  Allergen Reactions   Bee Venom Anaphylaxis    Wasp, yellow jacket, hornet   Moxifloxacin Palpitations   I reviewed her past medical history, social history, family history, and environmental history and no significant changes have been reported from her previous visit.  Review of Systems  Constitutional:  Negative for appetite change, chills, fever and unexpected weight change.  HENT:  Negative for congestion and rhinorrhea.   Eyes:  Negative for itching.  Respiratory:  Negative for cough, chest tightness, shortness of breath and wheezing.   Cardiovascular:  Negative for chest pain.  Gastrointestinal:  Negative for abdominal pain.  Genitourinary:  Negative for difficulty urinating.  Skin:  Negative for rash.  Allergic/Immunologic: Positive for environmental allergies.  Neurological:  Negative for headaches.   Objective: There were no vitals taken for this visit. There is no height or weight on file to  calculate BMI. Physical Exam Vitals and nursing note reviewed.  Constitutional:      Appearance: Normal appearance. She is well-developed.  HENT:     Head: Normocephalic and atraumatic.     Right Ear: Tympanic membrane and external ear normal.     Left Ear: Tympanic membrane and external ear normal.     Nose: Nose normal.     Mouth/Throat:     Mouth: Mucous membranes are moist.     Pharynx: Oropharynx is clear.  Eyes:     Conjunctiva/sclera: Conjunctivae normal.  Cardiovascular:     Rate and Rhythm: Normal rate and regular rhythm.     Heart sounds: Normal heart sounds. No murmur heard.   No friction rub. No gallop.  Pulmonary:     Effort: Pulmonary effort is normal.     Breath sounds: Normal breath sounds. No wheezing, rhonchi or rales.  Musculoskeletal:     Cervical back: Neck supple.  Skin:    General: Skin is warm.     Findings: No rash.  Neurological:     Mental Status: She is alert and oriented to person, place, and time.  Psychiatric:        Behavior: Behavior normal.   Previous notes and tests were reviewed. The plan was reviewed with the patient/family, and all questions/concerned were addressed.  It was my pleasure to see Tiawana today and participate in her care. Please feel free to contact me with any questions or concerns.  Sincerely,  Rexene Alberts, DO Allergy & Immunology  [facility] office: [place] office: [phone]

## 2020-12-22 ENCOUNTER — Other Ambulatory Visit: Payer: Self-pay

## 2020-12-22 ENCOUNTER — Ambulatory Visit: Payer: Medicare HMO

## 2020-12-22 ENCOUNTER — Ambulatory Visit: Payer: Medicare HMO | Admitting: Allergy

## 2020-12-22 ENCOUNTER — Encounter: Payer: Self-pay | Admitting: Allergy

## 2020-12-22 VITALS — BP 120/66 | HR 72 | Temp 98.6°F | Resp 17 | Ht 60.25 in | Wt 121.0 lb

## 2020-12-22 DIAGNOSIS — J302 Other seasonal allergic rhinitis: Secondary | ICD-10-CM | POA: Diagnosis not present

## 2020-12-22 DIAGNOSIS — Z91038 Other insect allergy status: Secondary | ICD-10-CM

## 2020-12-22 DIAGNOSIS — J3089 Other allergic rhinitis: Secondary | ICD-10-CM

## 2020-12-22 DIAGNOSIS — T781XXD Other adverse food reactions, not elsewhere classified, subsequent encounter: Secondary | ICD-10-CM

## 2020-12-22 MED ORDER — EPINEPHRINE 0.3 MG/0.3ML IJ SOAJ: 0.3000 mg | INTRAMUSCULAR | 1 refills | Status: DC | PRN

## 2020-12-22 NOTE — Assessment & Plan Note (Signed)
Past history - 2021 skin prick testing was positive to tree pollen and dust mites. Interim history - asymptomatic currently.  Continue environmental control measures as below.  May use over the counter antihistamines such as Zyrtec (cetirizine), Claritin (loratadine), Allegra (fexofenadine), or Xyzal (levocetirizine) daily as needed.   Not interested in environmental and fire ant injections at this time.

## 2020-12-22 NOTE — Assessment & Plan Note (Signed)
Past history - Anaphylactic reaction in the form of itching and feeling lightheaded and dizzy. To possibly yellow jacket requiring epinephrine, Solu-Medrol and Benadryl.  Symptoms resolved within 1 hour. Previously stung by yellow jacket without any symptoms.  Possibly stung by fire ant which caused pruritic rash on her body for 1 day. 2021 skin testing showed: negative to fire ant. Bloodwork positive to honeybee, white face hornet, yellow jacket, wasp, yellow hornet, bumblebee and fire ant. Started AIT on 02/04/2020 (mv, wasp, hb)  Interim history - No stings since the last visit.   Continue to avoid bee stings and fire ant.  Continue injections.   For mild symptoms you can take over the counter antihistamines such as Benadryl and monitor symptoms closely. If symptoms worsen or if you have severe symptoms including breathing issues, throat closure, significant swelling, whole body hives, severe diarrhea and vomiting, lightheadedness then inject epinephrine and seek immediate medical care afterwards.  Emergency action plan in place.   Let us know when ready to start fire ant injections.   Make sure you have a medical alert bracelet - for the stinging insects.

## 2020-12-22 NOTE — Progress Notes (Signed)
Follow Up Note  RE: [name] MRN: [id number] DOB: 1945-03-29 Date of Office Visit: 12/22/2020  Referring provider: Colon Branch, MD Primary care provider: Colon Branch, MD  Chief Complaint: Follow-up (Pt states she have been feeling good, no stings, or reactions overall health well managed.)  History of Present Illness: I had the pleasure of seeing [name] for a follow up visit at the [facility] on 12/22/2020. She is a 75 y.o. female, who is being followed for hymenoptera allergy on AIT, allergic rhinitis, adverse food reaction. Her previous allergy office visit was on 07/07/2020 with Dr. Maudie Mercury. Today is a regular follow up visit.  Hymenoptera allergy Currently on AIT for hymenoptera allergy and doing well on it. She had some localized itching which have improved. No stings since the last visit. Still has Epipen on hand.   She is not sure about the fire ant injections.  She has been spending less time outdoors. Even with insurance she has co-pays which are significant.    Seasonal and perennial allergic rhinitis Currently not on any daily antihistamines. Minimal symptoms.   Other adverse food reactions Patient has been eating little amounts of honey with no issues.   Assessment and Plan: [name] is a 75 y.o. female with: Hymenoptera allergy Past history - Anaphylactic reaction in the form of itching and feeling lightheaded and dizzy. To possibly yellow jacket requiring epinephrine, Solu-Medrol and Benadryl.  Symptoms resolved within 1 hour. Previously stung by yellow jacket without any symptoms.  Possibly stung by fire ant which caused pruritic rash on her body for 1 day. 2021 skin testing showed: negative to fire ant. Bloodwork positive to honeybee, white face hornet, yellow jacket, wasp, yellow hornet, bumblebee and fire ant. Started AIT on 02/04/2020 (mv, wasp, hb)  Interim history - No stings since the last visit.  Continue to avoid bee stings and fire  ant. Continue injections.  For mild symptoms you can take over the counter antihistamines such as Benadryl and monitor symptoms closely. If symptoms worsen or if you have severe symptoms including breathing issues, throat closure, significant swelling, whole body hives, severe diarrhea and vomiting, lightheadedness then inject epinephrine and seek immediate medical care afterwards. Emergency action plan in place.  Let [name] know when ready to start fire ant injections.  Make sure you have a medical alert bracelet - for the stinging insects.   Seasonal and perennial allergic rhinitis Past history - 2021 skin prick testing was positive to tree pollen and dust mites. Interim history - asymptomatic currently. Continue environmental control measures as below. May use over the counter antihistamines such as Zyrtec (cetirizine), Claritin (loratadine), Allegra (fexofenadine), or Xyzal (levocetirizine) daily as needed.  Not interested in environmental and fire ant injections at this time.   Return in about 1 year (around 12/22/2021).  Meds ordered this encounter  Medications   EPINEPHrine 0.3 mg/0.3 mL IJ SOAJ injection    Sig: Inject 0.3 mg into the muscle as needed for anaphylaxis.    Dispense:  2 each    Refill:  1    Please place Rx on hold until patient ready for pick up.   Lab Orders  No laboratory test(s) ordered today    Diagnostics: None.   Medication List:  Current Outpatient Medications  Medication Sig Dispense Refill   amLODipine (NORVASC) 10 MG tablet TAKE ONE TABLET BY MOUTH DAILY 90 tablet 1   atorvastatin (LIPITOR) 20 MG tablet Take 1 tablet (20 mg  total) by mouth daily. 90 tablet 1   calcium citrate-vitamin D (CITRACAL+D) 315-200 MG-UNIT per tablet Take 1 tablet by mouth. 600 mg bid     cholecalciferol (VITAMIN D) 1000 units tablet Take 1,000 Units by mouth daily.     EPINEPHrine 0.3 mg/0.3 mL IJ SOAJ injection Inject 0.3 mg into the muscle as needed for anaphylaxis. 2 each  1   levothyroxine (SYNTHROID) 50 MCG tablet TAKE ONE TABLET BY MOUTH DAILY BEFORE BREAKFAST 90 tablet 1   Multiple Vitamin (MULTIVITAMIN) tablet Take 1 tablet by mouth daily.     Omega-3 Fatty Acids (FISH OIL) 1200 MG CAPS Take 1 capsule by mouth 2 (two) times daily.     vitamin E 400 UNIT capsule Take 400 Units by mouth daily.     No current facility-administered medications for this visit.   Allergies: Allergies  Allergen Reactions   Bee Venom Anaphylaxis    Wasp, yellow jacket, hornet   Moxifloxacin Palpitations   I reviewed her past medical history, social history, family history, and environmental history and no significant changes have been reported from her previous visit.  Review of Systems  Constitutional:  Negative for appetite change, chills, fever and unexpected weight change.  HENT:  Negative for congestion and rhinorrhea.   Eyes:  Negative for itching.  Respiratory:  Negative for cough, chest tightness, shortness of breath and wheezing.   Cardiovascular:  Negative for chest pain.  Gastrointestinal:  Negative for abdominal pain.  Genitourinary:  Negative for difficulty urinating.  Skin:  Negative for rash.  Allergic/Immunologic: Positive for environmental allergies.  Neurological:  Negative for headaches.   Objective: BP 120/66   Pulse 72   Temp 98.6 F (37 C) (Temporal)   Resp 17   Ht 5' 0.25" (1.53 m)   Wt 121 lb (54.9 kg)   SpO2 100%   BMI 23.44 kg/m  Body mass index is 23.44 kg/m. Physical Exam Vitals and nursing note reviewed.  Constitutional:      Appearance: Normal appearance. She is well-developed.  HENT:     Head: Normocephalic and atraumatic.     Right Ear: Tympanic membrane and external ear normal.     Left Ear: Tympanic membrane and external ear normal.     Nose: Nose normal.     Mouth/Throat:     Mouth: Mucous membranes are moist.     Pharynx: Oropharynx is clear.  Eyes:     Conjunctiva/sclera: Conjunctivae normal.  Cardiovascular:      Rate and Rhythm: Normal rate and regular rhythm.     Heart sounds: Normal heart sounds. No murmur heard.   No friction rub. No gallop.  Pulmonary:     Effort: Pulmonary effort is normal.     Breath sounds: Normal breath sounds. No wheezing, rhonchi or rales.  Musculoskeletal:     Cervical back: Neck supple.  Skin:    General: Skin is warm.     Findings: No rash.  Neurological:     Mental Status: She is alert and oriented to person, place, and time.  Psychiatric:        Behavior: Behavior normal.   Previous notes and tests were reviewed. The plan was reviewed with the patient/family, and all questions/concerned were addressed.  It was my pleasure to see Melesa today and participate in her care. Please feel free to contact me with any questions or concerns.  Sincerely,  Rexene Alberts, DO Allergy & Immunology  [facility] office: [phone]  [place] office: [phone]

## 2020-12-22 NOTE — Patient Instructions (Addendum)
Bee sting reactions: Continue to avoid bee stings and fire ant. Continue injections.  For mild symptoms you can take over the counter antihistamines such as Benadryl and monitor symptoms closely. If symptoms worsen or if you have severe symptoms including breathing issues, throat closure, significant swelling, whole body hives, severe diarrhea and vomiting, lightheadedness then inject epinephrine and seek immediate medical care afterwards. Emergency action plan in place.  Let us know when ready to start fire ant injections.  Make sure you have a medical alert bracelet - for the stinging insects.   Environmental allergies 2021 skin testing positive to tree pollen and dust mites.  Continue environmental control measures as below. May use over the counter antihistamines such as Zyrtec (cetirizine), Claritin (loratadine), Allegra (fexofenadine), or Xyzal (levocetirizine) daily as needed.  Follow up in 12 months or sooner if needed.   Reducing Pollen Exposure Pollen seasons: trees (spring), grass (summer) and ragweed/weeds (fall). Keep windows closed in your home and car to lower pollen exposure.  Install air conditioning in the bedroom and throughout the house if possible.  Avoid going out in dry windy days - especially early morning. Pollen counts are highest between 5 - 10 AM and on dry, hot and windy days.  Save outside activities for late afternoon or after a heavy rain, when pollen levels are lower.  Avoid mowing of grass if you have grass pollen allergy. Be aware that pollen can also be transported indoors on people and pets.  Dry your clothes in an automatic dryer rather than hanging them outside where they might collect pollen.  Rinse hair and eyes before bedtime. Control of House Dust Mite Allergen Dust mite allergens are a common trigger of allergy and asthma symptoms. While they can be found throughout the house, these microscopic creatures thrive in warm, humid environments such as  bedding, upholstered furniture and carpeting. Because so much time is spent in the bedroom, it is essential to reduce mite levels there.  Encase pillows, mattresses, and box springs in special allergen-proof fabric covers or airtight, zippered plastic covers.  Bedding should be washed weekly in hot water (130 F) and dried in a hot dryer. Allergen-proof covers are available for comforters and pillows that can't be regularly washed.  Wash the allergy-proof covers every few months. Minimize clutter in the bedroom. Keep pets out of the bedroom.  Keep humidity less than 50% by using a dehumidifier or air conditioning. You can buy a humidity measuring device called a hygrometer to monitor this.  If possible, replace carpets with hardwood, linoleum, or washable area rugs. If that's not possible, vacuum frequently with a vacuum that has a HEPA filter. Remove all upholstered furniture and non-washable window drapes from the bedroom. Remove all non-washable stuffed toys from the bedroom.  Wash stuffed toys weekly.

## 2020-12-31 ENCOUNTER — Telehealth: Payer: Self-pay

## 2020-12-31 NOTE — Telephone Encounter (Signed)
Pt Reverified for Prolia, awaiting Summary of Benefits. Will call and schedule for appointment once received and reviewed with pt.

## 2021-01-07 DIAGNOSIS — L570 Actinic keratosis: Secondary | ICD-10-CM | POA: Diagnosis not present

## 2021-01-07 DIAGNOSIS — C44722 Squamous cell carcinoma of skin of right lower limb, including hip: Secondary | ICD-10-CM | POA: Diagnosis not present

## 2021-01-07 DIAGNOSIS — D485 Neoplasm of uncertain behavior of skin: Secondary | ICD-10-CM | POA: Diagnosis not present

## 2021-01-12 ENCOUNTER — Ambulatory Visit: Payer: Medicare HMO

## 2021-01-12 ENCOUNTER — Ambulatory Visit (INDEPENDENT_AMBULATORY_CARE_PROVIDER_SITE_OTHER): Payer: Medicare HMO

## 2021-01-12 ENCOUNTER — Other Ambulatory Visit: Payer: Self-pay

## 2021-01-12 DIAGNOSIS — T63441D Toxic effect of venom of bees, accidental (unintentional), subsequent encounter: Secondary | ICD-10-CM | POA: Diagnosis not present

## 2021-01-12 DIAGNOSIS — Z1231 Encounter for screening mammogram for malignant neoplasm of breast: Secondary | ICD-10-CM | POA: Diagnosis not present

## 2021-01-12 LAB — HM MAMMOGRAPHY

## 2021-01-14 ENCOUNTER — Encounter: Payer: Self-pay | Admitting: Internal Medicine

## 2021-01-19 ENCOUNTER — Encounter: Payer: Self-pay | Admitting: Internal Medicine

## 2021-01-19 DIAGNOSIS — R922 Inconclusive mammogram: Secondary | ICD-10-CM | POA: Diagnosis not present

## 2021-01-19 DIAGNOSIS — R928 Other abnormal and inconclusive findings on diagnostic imaging of breast: Secondary | ICD-10-CM | POA: Diagnosis not present

## 2021-01-19 LAB — HM MAMMOGRAPHY

## 2021-01-20 ENCOUNTER — Telehealth: Payer: Self-pay

## 2021-01-20 NOTE — Telephone Encounter (Signed)
Diagnostic mammogram orders signed and faxed back to [place] at [phone]. Form sent for scanning.

## 2021-01-24 ENCOUNTER — Encounter: Payer: Self-pay | Admitting: Internal Medicine

## 2021-02-07 ENCOUNTER — Ambulatory Visit: Payer: Medicare HMO

## 2021-02-09 ENCOUNTER — Telehealth: Payer: Self-pay

## 2021-02-09 NOTE — Telephone Encounter (Signed)
Patient calling to advise that she has a new dx of squamous cell carcinoma on her rt ankle and will be having surgery on 03-11-21. She is also experiencing itching around the site and all over. She wanted to know if she should get her venom injection while itching? Please advise.    Call back # [phone]

## 2021-02-09 NOTE — Telephone Encounter (Signed)
Please call patient back.  She can still get her injections if she wants to.

## 2021-02-09 NOTE — Telephone Encounter (Signed)
Pt informed and scheduled for tomorrow at 9

## 2021-02-10 ENCOUNTER — Other Ambulatory Visit: Payer: Self-pay

## 2021-02-10 ENCOUNTER — Ambulatory Visit (INDEPENDENT_AMBULATORY_CARE_PROVIDER_SITE_OTHER): Payer: Medicare HMO

## 2021-02-10 DIAGNOSIS — T63441D Toxic effect of venom of bees, accidental (unintentional), subsequent encounter: Secondary | ICD-10-CM

## 2021-02-10 DIAGNOSIS — R21 Rash and other nonspecific skin eruption: Secondary | ICD-10-CM

## 2021-02-10 MED ORDER — TRIAMCINOLONE ACETONIDE 0.1 % EX OINT: TOPICAL_OINTMENT | CUTANEOUS | 1 refills | Status: DC

## 2021-02-10 NOTE — Progress Notes (Signed)
Patient seen in clinic for venom therapy.  Has dermatitis on bilateral arms up to elbows.  Has squamous cell carcinoma on right lower extremity.  Not consistent with hives.  She is itchy, but minimally and she is using Vaseline on her arms and legs.  I am going to send in a topical steroid for her, and she will get her shot today.  She is otherwise feeling well.

## 2021-03-03 ENCOUNTER — Encounter: Payer: Medicare HMO | Admitting: Internal Medicine

## 2021-03-07 ENCOUNTER — Other Ambulatory Visit: Payer: Self-pay

## 2021-03-07 ENCOUNTER — Ambulatory Visit (INDEPENDENT_AMBULATORY_CARE_PROVIDER_SITE_OTHER): Payer: Medicare HMO

## 2021-03-07 DIAGNOSIS — T63441D Toxic effect of venom of bees, accidental (unintentional), subsequent encounter: Secondary | ICD-10-CM

## 2021-03-08 ENCOUNTER — Telehealth: Payer: Self-pay

## 2021-03-08 ENCOUNTER — Ambulatory Visit: Payer: Medicare HMO

## 2021-03-08 NOTE — Telephone Encounter (Signed)
Prolia VOB initiated via [email or web address]  Last OV:  Next OV:  Last Prolia inj: 09/09/20 Next Prolia inj DUE: 03/13/21

## 2021-03-11 DIAGNOSIS — L300 Nummular dermatitis: Secondary | ICD-10-CM | POA: Diagnosis not present

## 2021-03-11 DIAGNOSIS — I872 Venous insufficiency (chronic) (peripheral): Secondary | ICD-10-CM | POA: Diagnosis not present

## 2021-03-11 DIAGNOSIS — C44722 Squamous cell carcinoma of skin of right lower limb, including hip: Secondary | ICD-10-CM | POA: Diagnosis not present

## 2021-03-12 NOTE — Telephone Encounter (Signed)
Prior Auth required for Prolia  PA PROCESS DETAILS: Precertification is required. Call [phone] or complete the Precertification form available at [email or web address]

## 2021-03-15 ENCOUNTER — Encounter: Payer: Self-pay | Admitting: Internal Medicine

## 2021-03-15 NOTE — Telephone Encounter (Signed)
Patient would like to her her prolia shot on her cpe appointment on 01/18. Please advice.

## 2021-03-16 ENCOUNTER — Telehealth: Payer: Self-pay

## 2021-03-16 NOTE — Telephone Encounter (Signed)
This approval authorizes your coverage from 03/06/2021 - 03/05/2022, unless we notify you otherwise, and as long as the following conditions apply:

## 2021-03-16 NOTE — Telephone Encounter (Signed)
[name] Key: [id number] - PA Case ID: [id number]

## 2021-03-23 ENCOUNTER — Ambulatory Visit (INDEPENDENT_AMBULATORY_CARE_PROVIDER_SITE_OTHER): Payer: Medicare HMO | Admitting: Internal Medicine

## 2021-03-23 ENCOUNTER — Encounter: Payer: Self-pay | Admitting: Internal Medicine

## 2021-03-23 VITALS — BP 134/70 | HR 76 | Temp 98.1°F | Resp 16 | Ht 60.0 in | Wt 122.2 lb

## 2021-03-23 DIAGNOSIS — M810 Age-related osteoporosis without current pathological fracture: Secondary | ICD-10-CM

## 2021-03-23 DIAGNOSIS — I10 Essential (primary) hypertension: Secondary | ICD-10-CM

## 2021-03-23 DIAGNOSIS — E785 Hyperlipidemia, unspecified: Secondary | ICD-10-CM | POA: Diagnosis not present

## 2021-03-23 DIAGNOSIS — Z0000 Encounter for general adult medical examination without abnormal findings: Secondary | ICD-10-CM | POA: Diagnosis not present

## 2021-03-23 DIAGNOSIS — E039 Hypothyroidism, unspecified: Secondary | ICD-10-CM | POA: Diagnosis not present

## 2021-03-23 DIAGNOSIS — Z23 Encounter for immunization: Secondary | ICD-10-CM

## 2021-03-23 LAB — CBC WITH DIFFERENTIAL/PLATELET
Basophils Absolute: 0 10*3/uL (ref 0.0–0.1)
Basophils Relative: 0.6 % (ref 0.0–3.0)
Eosinophils Absolute: 0.1 10*3/uL (ref 0.0–0.7)
Eosinophils Relative: 1.1 % (ref 0.0–5.0)
HCT: 43.5 % (ref 36.0–46.0)
Hemoglobin: 14.3 g/dL (ref 12.0–15.0)
Lymphocytes Relative: 24.7 % (ref 12.0–46.0)
Lymphs Abs: 1.9 10*3/uL (ref 0.7–4.0)
MCHC: 32.9 g/dL (ref 30.0–36.0)
MCV: 89.6 fl (ref 78.0–100.0)
Monocytes Absolute: 0.5 10*3/uL (ref 0.1–1.0)
Monocytes Relative: 5.9 % (ref 3.0–12.0)
Neutro Abs: 5.2 10*3/uL (ref 1.4–7.7)
Neutrophils Relative %: 67.7 % (ref 43.0–77.0)
Platelets: 347 10*3/uL (ref 150.0–400.0)
RBC: 4.86 Mil/uL (ref 3.87–5.11)
RDW: 13.1 % (ref 11.5–15.5)
WBC: 7.6 10*3/uL (ref 4.0–10.5)

## 2021-03-23 LAB — LIPID PANEL
Cholesterol: 227 mg/dL — ABNORMAL HIGH (ref 0–200)
HDL: 59.9 mg/dL (ref >39.00)
LDL Cholesterol: 145 mg/dL — ABNORMAL HIGH (ref 0–99)
NonHDL: 166.76
Total CHOL/HDL Ratio: 4
Triglycerides: 111 mg/dL (ref 0.0–149.0)
VLDL: 22.2 mg/dL (ref 0.0–40.0)

## 2021-03-23 LAB — COMPREHENSIVE METABOLIC PANEL
ALT: 96 U/L — ABNORMAL HIGH (ref 0–35)
AST: 61 U/L — ABNORMAL HIGH (ref 0–37)
Albumin: 4.6 g/dL (ref 3.5–5.2)
Alkaline Phosphatase: 53 U/L (ref 39–117)
BUN: 11 mg/dL (ref 6–23)
CO2: 29 mEq/L (ref 19–32)
Calcium: 9.2 mg/dL (ref 8.4–10.5)
Chloride: 101 mEq/L (ref 96–112)
Creatinine, Ser: 0.72 mg/dL (ref 0.40–1.20)
GFR: 81.71 mL/min (ref >60.00)
Glucose, Bld: 87 mg/dL (ref 70–99)
Potassium: 4.2 mEq/L (ref 3.5–5.1)
Sodium: 140 mEq/L (ref 135–145)
Total Bilirubin: 0.6 mg/dL (ref 0.2–1.2)
Total Protein: 7.1 g/dL (ref 6.0–8.3)

## 2021-03-23 LAB — TSH: TSH: 2.4 u[IU]/mL (ref 0.35–5.50)

## 2021-03-23 MED ORDER — DENOSUMAB 60 MG/ML ~~LOC~~ SOSY
60.0000 mg | PREFILLED_SYRINGE | Freq: Once | SUBCUTANEOUS | Status: AC
  Administered 2021-03-23: 60 mg via SUBCUTANEOUS

## 2021-03-23 NOTE — Assessment & Plan Note (Signed)
-   Td 2012  >> Tdap rec -  Pneumonia shot 2012, Prevnar 02-2015; PNM 20: Today - Zostavax 2008; s/p shingrix x 2 -COVID vaccines: Up-to-date -  Had a  flu shot -CCS: 2011 colonoscopy normal Dr. Oletta Lamas; colonoscopy 05/2020:   + Polyps, hyperplastic, no further colonoscopy -Female care:  saw gyn > 1 year ago not sure if was told to RTC, asx,  rec to reach out for evaluation if she has any symptoms. MMG November 2022 ([facility]) -Labs: CMP, FLP, CBC, TSH. -Lifestyle discussed -ACP documents on file

## 2021-03-23 NOTE — Patient Instructions (Signed)
Proceed with tetanus shot at your convenience ("TDAP)  Check the  blood pressure regularly BP GOAL is between 110/65 and  135/85. If it is consistently higher or lower, let me know      GO TO THE LAB : Get the blood work     [place], [place] back for   a checkup in 6 months

## 2021-03-23 NOTE — Assessment & Plan Note (Signed)
Here for CPX HTN: On amlodipine, ambulatory BP never more than 120/80. Hyperlipidemia: On Lipitor, labs. Hypothyroidism: On Synthroid, labs Allergies: managed elsewhere Osteoporosis: Received Prolia today, recommend vitamin D supplements.  (Last levels were okay) SCC: Status post recent excision from the right pretibial area. RTC 6 months

## 2021-03-23 NOTE — Progress Notes (Signed)
Subjective:    Patient ID: [name], female    DOB: [birth year], 76 y.o.   MRN: [id number]  DOS:  03/23/2021 Type of visit - description: CPX  Since the last office visit is doing well. Did have a excision of SCC from the right pretibial area.   Review of Systems  Other than above, a 14 point review of systems is negative     Past Medical History:  Diagnosis Date   Angio-edema    Asthma    Hyperlipidemia    Hypertension    Hypothyroidism    Liposarcoma ([place]) 1972   r leg   Osteoporosis    SCC (squamous cell carcinoma)    Urticaria     Past Surgical History:  Procedure Laterality Date   ABDOMINAL HYSTERECTOMY     no oophorectomy   BREAST LUMPECTOMY     x2 left breast   R leg liposarcoma  1973   SQUAMOUS CELL CARCINOMA EXCISION     RLE   TONSILLECTOMY     Social History   Socioeconomic History   Marital status: Married    Spouse name: Not on file   Number of children: 2   Years of education: Not on file   Highest education level: Not on file  Occupational History   Occupation: retired -- Art therapist, class room assistant   Tobacco Use   Smoking status: Never   Smokeless tobacco: Never  Vaping Use   Vaping Use: Never used  Substance and Sexual Activity   Alcohol use: No   Drug use: No   Sexual activity: Not Currently    Birth control/protection: Post-menopausal  Other Topics Concern   Not on file  Social History Narrative   Lost a son when he was 71 y/o   Lives w/ husband    Social Determinants of Radio broadcast assistant Strain: Low Risk    Difficulty of Paying Living Expenses: Not hard at all  Food Insecurity: No Food Insecurity   Worried About Charity fundraiser in the Last Year: Never true   Arboriculturist in the Last Year: Never true  Transportation Needs: No Transportation Needs   Lack of Transportation (Medical): No   Lack of Transportation (Non-Medical): No  Physical Activity: Sufficiently Active   Days of Exercise per  Week: 5 days   Minutes of Exercise per Session: 60 min  Stress: No Stress Concern Present   Feeling of Stress : Not at all  Social Connections: Socially Integrated   Frequency of Communication with Friends and Family: More than three times a week   Frequency of Social Gatherings with Friends and Family: More than three times a week   Attends Religious Services: 1 to 4 times per year   Active Member of [employer] or Organizations: Yes   Attends Archivist Meetings: 1 to 4 times per year   Marital Status: Married  Human resources officer Violence: Not At Risk   Fear of Current or Ex-Partner: No   Emotionally Abused: No   Physically Abused: No   Sexually Abused: No    Current Outpatient Medications  Medication Instructions   amLODipine (NORVASC) 10 MG tablet TAKE ONE TABLET BY MOUTH DAILY   atorvastatin (LIPITOR) 20 mg, Oral, Daily   calcium citrate-vitamin D (CITRACAL+D) 315-200 MG-UNIT per tablet 1 tablet   cholecalciferol (VITAMIN D) 1,000 Units, Oral, Daily   EPINEPHrine (EPI-PEN) 0.3 mg, Intramuscular, As needed   levothyroxine (SYNTHROID) 50 MCG tablet TAKE  ONE TABLET BY MOUTH DAILY BEFORE BREAKFAST   Multiple Vitamin (MULTIVITAMIN) tablet 1 tablet, Oral, Daily,     Omega-3 Fatty Acids (FISH OIL) 1200 MG CAPS 1 capsule, Oral, 2 times daily   triamcinolone ointment (KENALOG) 0.1 % Apply topically twice daily to BODY as needed for red, sandpaper like rash.  Do not use on face, groin or armpits.   vitamin E 400 Units, Daily       Objective:   Physical Exam BP 134/70 (BP Location: Left Arm, Patient Position: Sitting, Cuff Size: Small)    Pulse 76    Temp 98.1 F (36.7 C) (Oral)    Resp 16    Ht 5' (1.524 m)    Wt 122 lb 4 oz (55.5 kg)    SpO2 96%    BMI 23.88 kg/m  General: Well developed, NAD, BMI noted Neck: No  thyromegaly  HEENT:  Normocephalic . Face symmetric, atraumatic Lungs:  CTA B Normal respiratory effort, no intercostal retractions, no accessory muscle  use. Heart: RRR,  no murmur.  Abdomen:  Not distended, soft, non-tender. No rebound or rigidity.   Lower extremities: no pretibial edema bilaterally  Skin: R pretibial area wrapped, she recently had a SCC excision. Neurologic:  alert & oriented X3.  Speech normal, gait: Limping, some pain at the R leg, status post SCC excision. Strength symmetric and appropriate for age.  Psych: Cognition and judgment appear intact.  Cooperative with normal attention span and concentration.  Behavior appropriate. No anxious or depressed appearing.     Assessment      Assessment HTN Hyperlipidemia Hypothyroidism Osteoporosis --T score 2013: -2.6,  T score 02-2014  -2.5 after  Reclast x 3. Last reclast 05-2013. T score 02-2016: -2.4. T score -2.6 (04-2018), Rx Prolia.  --Normal Vit D 2014 SCC, skin cancer, right lower extremity, sees derm q 6 months Liposarcoma right leg 1972 (near ankle, posteriorly)  PLAN:  Here for CPX HTN: On amlodipine, ambulatory BP never more than 120/80. Hyperlipidemia: On Lipitor, labs. Hypothyroidism: On Synthroid, labs Allergies: managed elsewhere Osteoporosis: Received Prolia today, recommend vitamin D supplements.  (Last levels were okay) SCC: Status post recent excision from the right pretibial area. RTC 6 months     This visit occurred during the SARS-CoV-2 public health emergency.  Safety protocols were in place, including screening questions prior to the visit, additional usage of staff PPE, and extensive cleaning of exam room while observing appropriate contact time as indicated for disinfecting solutions.

## 2021-03-26 NOTE — Telephone Encounter (Signed)
Last Prolia inj 03/23/21 Next Prolia inj 09/21/21

## 2021-03-26 NOTE — Telephone Encounter (Signed)
Prior auth initiated via [employer] Authorization Number : [id number] Status: Approved

## 2021-03-28 ENCOUNTER — Telehealth: Payer: Self-pay | Admitting: Internal Medicine

## 2021-03-28 DIAGNOSIS — R7989 Other specified abnormal findings of blood chemistry: Secondary | ICD-10-CM

## 2021-03-28 DIAGNOSIS — E785 Hyperlipidemia, unspecified: Secondary | ICD-10-CM

## 2021-03-28 NOTE — Telephone Encounter (Signed)
Please call patient: LFTs increased, taking Tylenol? Cholesterol elevated.  Compliance with atorvastatin? Plan to recheck labs in 2 months, advised to make an appointment for lab only

## 2021-03-28 NOTE — Telephone Encounter (Signed)
Spoke w/ Pt- informed of results, she has been alternating 2 regular strength Tylenol and 2 ibuprofen after surgery 3 weeks ago (skin cancer removal) as directed by dermatologist. She had stitches removed at end of last week and has not needed tylenol/ibuprofen. She has been taking atorvastatin as prescribed. I scheduled her for 05/24/21 for labs. Please advise.

## 2021-03-28 NOTE — Telephone Encounter (Signed)
Okay, please arrange the following: AST, ALT, FLP DX high cholesterol, elevated LFTs.

## 2021-03-28 NOTE — Telephone Encounter (Signed)
Labs ordered.

## 2021-04-04 ENCOUNTER — Ambulatory Visit (INDEPENDENT_AMBULATORY_CARE_PROVIDER_SITE_OTHER): Payer: Medicare HMO

## 2021-04-04 ENCOUNTER — Other Ambulatory Visit: Payer: Self-pay

## 2021-04-04 DIAGNOSIS — T63441D Toxic effect of venom of bees, accidental (unintentional), subsequent encounter: Secondary | ICD-10-CM

## 2021-04-08 ENCOUNTER — Other Ambulatory Visit: Payer: Self-pay | Admitting: Internal Medicine

## 2021-04-11 ENCOUNTER — Encounter: Payer: Self-pay | Admitting: Internal Medicine

## 2021-05-02 ENCOUNTER — Ambulatory Visit (INDEPENDENT_AMBULATORY_CARE_PROVIDER_SITE_OTHER): Payer: Medicare HMO

## 2021-05-02 ENCOUNTER — Other Ambulatory Visit: Payer: Self-pay

## 2021-05-02 DIAGNOSIS — T63441D Toxic effect of venom of bees, accidental (unintentional), subsequent encounter: Secondary | ICD-10-CM | POA: Diagnosis not present

## 2021-05-16 ENCOUNTER — Other Ambulatory Visit: Payer: Self-pay | Admitting: Internal Medicine

## 2021-05-18 DIAGNOSIS — H43813 Vitreous degeneration, bilateral: Secondary | ICD-10-CM | POA: Diagnosis not present

## 2021-05-18 DIAGNOSIS — H2513 Age-related nuclear cataract, bilateral: Secondary | ICD-10-CM | POA: Diagnosis not present

## 2021-05-18 DIAGNOSIS — H40023 Open angle with borderline findings, high risk, bilateral: Secondary | ICD-10-CM | POA: Diagnosis not present

## 2021-05-18 DIAGNOSIS — H04123 Dry eye syndrome of bilateral lacrimal glands: Secondary | ICD-10-CM | POA: Diagnosis not present

## 2021-05-24 ENCOUNTER — Other Ambulatory Visit (INDEPENDENT_AMBULATORY_CARE_PROVIDER_SITE_OTHER): Payer: Medicare HMO

## 2021-05-24 DIAGNOSIS — E785 Hyperlipidemia, unspecified: Secondary | ICD-10-CM

## 2021-05-24 DIAGNOSIS — R7989 Other specified abnormal findings of blood chemistry: Secondary | ICD-10-CM | POA: Diagnosis not present

## 2021-05-24 LAB — LIPID PANEL
Cholesterol: 188 mg/dL (ref 0–200)
HDL: 69.3 mg/dL (ref >39.00)
LDL Cholesterol: 104 mg/dL — ABNORMAL HIGH (ref 0–99)
NonHDL: 118.59
Total CHOL/HDL Ratio: 3
Triglycerides: 73 mg/dL (ref 0.0–149.0)
VLDL: 14.6 mg/dL (ref 0.0–40.0)

## 2021-05-24 LAB — ALT: ALT: 21 U/L (ref 0–35)

## 2021-05-24 LAB — AST: AST: 22 U/L (ref 0–37)

## 2021-05-26 ENCOUNTER — Other Ambulatory Visit: Payer: Self-pay | Admitting: Internal Medicine

## 2021-05-30 ENCOUNTER — Ambulatory Visit (INDEPENDENT_AMBULATORY_CARE_PROVIDER_SITE_OTHER): Payer: Medicare HMO | Admitting: *Deleted

## 2021-05-30 DIAGNOSIS — T63441D Toxic effect of venom of bees, accidental (unintentional), subsequent encounter: Secondary | ICD-10-CM

## 2021-06-09 ENCOUNTER — Ambulatory Visit: Payer: Medicare HMO

## 2021-06-09 ENCOUNTER — Ambulatory Visit (INDEPENDENT_AMBULATORY_CARE_PROVIDER_SITE_OTHER): Payer: Medicare HMO

## 2021-06-09 DIAGNOSIS — Z780 Asymptomatic menopausal state: Secondary | ICD-10-CM

## 2021-06-09 DIAGNOSIS — Z0000 Encounter for general adult medical examination without abnormal findings: Secondary | ICD-10-CM

## 2021-06-09 NOTE — Progress Notes (Signed)
? ?Subjective:  ? [name] is a 76 y.o. female who presents for Medicare Annual (Subsequent) preventive examination. ? ?I connected with  [name] on 06/09/21 by a audio enabled telemedicine application and verified that I am speaking with the correct person using two identifiers. ? ?Patient Location: Home ? ?Provider Location: Office/Clinic ? ?I discussed the limitations of evaluation and management by telemedicine. The patient expressed understanding and agreed to proceed.  ? ?Review of Systems    ? ?Cardiac Risk Factors include: advanced age (>24mn, >>71women);dyslipidemia;hypertension ? ?   ?Objective:  ?  ?There were no vitals filed for this visit. ?There is no height or weight on file to calculate BMI. ? ? ?  06/09/2021  ?  3:45 PM 06/03/2020  ?  2:05 PM 12/10/2019  ?  3:09 PM 04/14/2019  ?  9:08 AM 04/08/2018  ? 10:16 AM 04/06/2017  ?  2:25 PM 04/04/2016  ?  9:16 AM  ?Advanced Directives  ?Does Patient Have a Medical Advance Directive? Yes Yes Yes Yes Yes Yes Yes  ?Type of AParamedicof AGeraldineOut of facility DNR (pink MOST or yellow form);Living will HPlymouth MeetingLiving will [place]Living will [place]High [place]Living will [place]Living will Living will [place]Living will  ?Does patient want to make changes to medical advance directive?    No - Patient declined No - Patient declined    ?Copy of HMicanopyin Chart? Yes - validated most recent copy scanned in chart (See row information) No - copy requested  No - copy requested No - copy requested No - copy requested No - copy requested  ? ? ?Current Medications (verified) ?Outpatient Encounter Medications as of 06/09/2021  ?Medication Sig  ? amLODipine (NORVASC) 10 MG tablet TAKE ONE TABLET BY MOUTH DAILY  ? atorvastatin (LIPITOR) 20 MG tablet TAKE ONE TABLET BY MOUTH DAILY  ? calcium citrate-vitamin D (CITRACAL+D) 315-200  MG-UNIT per tablet Take 1 tablet by mouth. 600 mg bid  ? cholecalciferol (VITAMIN D) 1000 units tablet Take 1,000 Units by mouth daily.  ? EPINEPHrine 0.3 mg/0.3 mL IJ SOAJ injection Inject 0.3 mg into the muscle as needed for anaphylaxis.  ? levothyroxine (SYNTHROID) 50 MCG tablet TAKE ONE TABLET BY MOUTH DAILY BEFORE BREAKFAST  ? Multiple Vitamin (MULTIVITAMIN) tablet Take 1 tablet by mouth daily.  ? Omega-3 Fatty Acids (FISH OIL) 1200 MG CAPS Take 1 capsule by mouth 2 (two) times daily.  ? triamcinolone ointment (KENALOG) 0.1 % Apply topically twice daily to BODY as needed for red, sandpaper like rash.  Do not use on face, groin or armpits.  ? vitamin E 400 UNIT capsule Take 400 Units by mouth daily.  ? ?No facility-administered encounter medications on file as of 06/09/2021.  ? ? ?Allergies (verified) ?Bee venom and Moxifloxacin  ? ?History: ?Past Medical History:  ?Diagnosis Date  ? Angio-edema   ? Asthma   ? Hyperlipidemia   ? Hypertension   ? Hypothyroidism   ? Liposarcoma ([facility]tec 1972  ? r leg  ? Osteoporosis   ? SCC (squamous cell carcinoma)   ? Urticaria   ? ?Past Surgical History:  ?Procedure Laterality Date  ? ABDOMINAL HYSTERECTOMY    ? no oophorectomy  ? BREAST LUMPECTOMY    ? x2 left breast  ? R leg liposarcoma  1973  ? SQUAMOUS CELL CARCINOMA EXCISION    ? RLE  ? TONSILLECTOMY    ? ?  Family History  ?Problem Relation Age of Onset  ? Heart disease Mother   ?     congenital heart defect  ? Cancer Father   ?     kidney cancer  ? Cancer Brother   ?     Brain Cancer  ? Cancer Maternal Aunt   ?     breast cancer  ? Cancer Paternal Aunt   ?     breast cancer  ? Colon cancer Neg Hx   ? Diabetes Neg Hx   ? Allergic rhinitis Neg Hx   ? Asthma Neg Hx   ? Eczema Neg Hx   ? Urticaria Neg Hx   ? ?Social History  ? ?Socioeconomic History  ? Marital status: Married  ?  Spouse name: Not on file  ? Number of children: 2  ? Years of education: Not on file  ? Highest education level: Not on file  ?Occupational History  ?  Occupation: retired -- Art therapist, class Research scientist (medical)   ?Tobacco Use  ? Smoking status: Never  ? Smokeless tobacco: Never  ?Vaping Use  ? Vaping Use: Never used  ?Substance and Sexual Activity  ? Alcohol use: No  ? Drug use: No  ? Sexual activity: Not Currently  ?  Birth control/protection: Post-menopausal  ?Other Topics Concern  ? Not on file  ?Social History Narrative  ? Lost a son when he was 53 y/o  ? Lives w/ husband   ? ?Social Determinants of Health  ? ?Financial Resource Strain: Low Risk   ? Difficulty of Paying Living Expenses: Not hard at all  ?Food Insecurity: No Food Insecurity  ? Worried About Charity fundraiser in the Last Year: Never true  ? Ran Out of Food in the Last Year: Never true  ?Transportation Needs: No Transportation Needs  ? Lack of Transportation (Medical): No  ? Lack of Transportation (Non-Medical): No  ?Physical Activity: Not on file  ?Stress: No Stress Concern Present  ? Feeling of Stress : Only a little  ?Social Connections: Moderately Integrated  ? Frequency of Communication with Friends and Family: More than three times a week  ? Frequency of Social Gatherings with Friends and Family: Twice a week  ? Attends Religious Services: More than 4 times per year  ? Active Member of Clubs or Organizations: No  ? Attends Archivist Meetings: Never  ? Marital Status: Married  ? ? ?Tobacco Counseling ?Counseling given: Not Answered ? ? ?Clinical Intake: ? ?Pre-visit preparation completed: Yes ? ?Pain : No/denies pain ? ?  ? ?Nutritional Risks: None ?Diabetes: No ? ?How often do you need to have someone help you when you read instructions, pamphlets, or other written materials from your doctor or pharmacy?: 1 - Never ? ?Diabetic?No ? ?Interpreter Needed?: No ? ?Information entered by :: Jospeh Mangel ? ? ?Activities of Daily Living ? ?  06/09/2021  ?  3:47 PM  ?In your present state of health, do you have any difficulty performing the following activities:  ?Hearing? 0  ?Vision?  0  ?Difficulty concentrating or making decisions? 0  ?Walking or climbing stairs? 0  ?Dressing or bathing? 0  ?Doing errands, shopping? 0  ?Preparing Food and eating ? N  ?Using the Toilet? N  ?In the past six months, have you accidently leaked urine? Y  ?Do you have problems with loss of bowel control? Y  ?Managing your Medications? N  ?Managing your Finances? N  ?Housekeeping or managing your Housekeeping?  N  ? ? ?Patient Care Team: ?Colon Branch, MD as PCP - General (Internal Medicine) ?Jari Pigg, MD as Consulting Physician (Dermatology) ?Shawnie Dapper, DO as Consulting Physician (Optometry) ?Elsie Saas, MD as Consulting Physician (Orthopedic Surgery) ?Beshears, Dorie Rank, DMD as Consulting Physician (Dentistry) ?Lorretta Harp, MD as Consulting Physician (Cardiology) ? ?Indicate any recent Medical Services you may have received from other than [facility] providers in the past year (date may be approximate). ? ?   ?Assessment:  ? This is a routine wellness examination for [name] ? ?Hearing/Vision screen ?No results found. ? ?Dietary issues and exercise activities discussed: ?Current Exercise Habits: Home exercise routine, Type of exercise: stretching;walking;Other - see comments, Time (Minutes): 60, Frequency (Times/Week): 7, Weekly Exercise (Minutes/Week): 420, Intensity: Mild, Exercise limited by: None identified ? ? Goals Addressed   ? ?  ?  ?  ?  ? This Visit's Progress  ?  Patient Stated   On track  ?  Maintain current healthy lifestyle  ?  ? ?  ? ?Depression Screen ? ?  06/09/2021  ?  3:45 PM 03/23/2021  ?  9:45 AM 08/30/2020  ?  8:11 AM 06/03/2020  ?  2:08 PM 02/25/2020  ?  8:58 AM 04/14/2019  ?  9:13 AM 04/08/2018  ? 10:19 AM  ?PHQ 2/9 Scores  ?PHQ - 2 Score 0 0 0 1 0 0 0  ?  ?Fall Risk ? ?  06/09/2021  ?  3:45 PM 03/23/2021  ?  9:45 AM 08/30/2020  ?  8:11 AM 06/03/2020  ?  2:08 PM 02/25/2020  ?  8:58 AM  ?Fall Risk   ?Falls in the past year? 0 0 0 0 0  ?Number falls in past yr: 0 0 0 0 0  ?Injury with Fall? 0 0 0 0  0  ?Risk for fall due to : No Fall Risks      ?Follow up Falls evaluation completed Falls evaluation completed  Falls prevention discussed   ? ? ?FALL RISK PREVENTION PERTAINING TO THE HOME: ? ?Any stairs in

## 2021-06-09 NOTE — Patient Instructions (Signed)
[name] , ?Thank you for taking time to come for your Medicare Wellness Visit. I appreciate your ongoing commitment to your health goals. Please review the following plan we discussed and let me know if I can assist you in the future.  ? ?Screening recommendations/referrals: ?Colonoscopy: no longer needed ?Mammogram: 01/19/21 due 01/19/22 ?Bone Density: ordered 06/09/21 ?Recommended yearly ophthalmology/optometry visit for glaucoma screening and checkup ?Recommended yearly dental visit for hygiene and checkup ? ?Vaccinations: ?Influenza vaccine: up to date ?Pneumococcal vaccine: up to date ?Tdap vaccine: up to date ?Shingles vaccine: up to date   ?Covid-19:completed ? ?Advanced directives: yes, on file ? ?Conditions/risks identified: see problem list ? ?Next appointment: Follow up in one year for your annual wellness visit 06/14/22 ? ? ?Preventive Care 76 Years and Older, Female ?Preventive care refers to lifestyle choices and visits with your health care provider that can promote health and wellness. ?What does preventive care include? ?A yearly physical exam. This is also called an annual well check. ?Dental exams once or twice a year. ?Routine eye exams. Ask your health care provider how often you should have your eyes checked. ?Personal lifestyle choices, including: ?Daily care of your teeth and gums. ?Regular physical activity. ?Eating a healthy diet. ?Avoiding tobacco and drug use. ?Limiting alcohol use. ?Practicing safe sex. ?Taking low-dose aspirin every day. ?Taking vitamin and mineral supplements as recommended by your health care provider. ?What happens during an annual well check? ?The services and screenings done by your health care provider during your annual well check will depend on your age, overall health, lifestyle risk factors, and family history of disease. ?Counseling  ?Your health care provider may ask you questions about your: ?Alcohol use. ?Tobacco use. ?Drug use. ?Emotional well-being. ?Home  and relationship well-being. ?Sexual activity. ?Eating habits. ?History of falls. ?Memory and ability to understand (cognition). ?Work and work Statistician. ?Reproductive health. ?Screening  ?You may have the following tests or measurements: ?Height, weight, and BMI. ?Blood pressure. ?Lipid and cholesterol levels. These may be checked every 5 years, or more frequently if you are over 76 years old. ?Skin check. ?Lung cancer screening. You may have this screening every year starting at age 76 if you have a 30-pack-year history of smoking and currently smoke or have quit within the past 15 years. ?Fecal occult blood test (FOBT) of the stool. You may have this test every year starting at age 76. ?Flexible sigmoidoscopy or colonoscopy. You may have a sigmoidoscopy every 5 years or a colonoscopy every 10 years starting at age 76. ?Hepatitis C blood test. ?Hepatitis B blood test. ?Sexually transmitted disease (STD) testing. ?Diabetes screening. This is done by checking your blood sugar (glucose) after you have not eaten for a while (fasting). You may have this done every 1-3 years. ?Bone density scan. This is done to screen for osteoporosis. You may have this done starting at age 20. ?Mammogram. This may be done every 1-2 years. Talk to your health care provider about how often you should have regular mammograms. ?Talk with your health care provider about your test results, treatment options, and if necessary, the need for more tests. ?Vaccines  ?Your health care provider may recommend certain vaccines, such as: ?Influenza vaccine. This is recommended every year. ?Tetanus, diphtheria, and acellular pertussis (Tdap, Td) vaccine. You may need a Td booster every 10 years. ?Zoster vaccine. You may need this after age 79. ?Pneumococcal 13-valent conjugate (PCV13) vaccine. One dose is recommended after age 23. ?Pneumococcal polysaccharide (PPSV23) vaccine. One dose  is recommended after age 57. ?Talk to your health care provider  about which screenings and vaccines you need and how often you need them. ?This information is not intended to replace advice given to you by your health care provider. Make sure you discuss any questions you have with your health care provider. ?Document Released: 03/19/2015 Document Revised: 11/10/2015 Document Reviewed: 12/22/2014 ?Elsevier Interactive Patient Education ? 2017 [place]. ? ?Fall Prevention in the Home ?Falls can cause injuries. They can happen to people of all ages. There are many things you can do to make your home safe and to help prevent falls. ?What can I do on the outside of my home? ?Regularly fix the edges of walkways and driveways and fix any cracks. ?Remove anything that might make you trip as you walk through a door, such as a raised step or threshold. ?Trim any bushes or trees on the path to your home. ?Use bright outdoor lighting. ?Clear any walking paths of anything that might make someone trip, such as rocks or tools. ?Regularly check to see if handrails are loose or broken. Make sure that both sides of any steps have handrails. ?Any raised decks and porches should have guardrails on the edges. ?Have any leaves, snow, or ice cleared regularly. ?Use sand or salt on walking paths during winter. ?Clean up any spills in your garage right away. This includes oil or grease spills. ?What can I do in the bathroom? ?Use night lights. ?Install grab bars by the toilet and in the tub and shower. Do not use towel bars as grab bars. ?Use non-skid mats or decals in the tub or shower. ?If you need to sit down in the shower, use a plastic, non-slip stool. ?Keep the floor dry. Clean up any water that spills on the floor as soon as it happens. ?Remove soap buildup in the tub or shower regularly. ?Attach bath mats securely with double-sided non-slip rug tape. ?Do not have throw rugs and other things on the floor that can make you trip. ?What can I do in the bedroom? ?Use night lights. ?Make sure  that you have a light by your bed that is easy to reach. ?Do not use any sheets or blankets that are too big for your bed. They should not hang down onto the floor. ?Have a firm chair that has side arms. You can use this for support while you get dressed. ?Do not have throw rugs and other things on the floor that can make you trip. ?What can I do in the kitchen? ?Clean up any spills right away. ?Avoid walking on wet floors. ?Keep items that you use a lot in easy-to-reach places. ?If you need to reach something above you, use a strong step stool that has a grab bar. ?Keep electrical cords out of the way. ?Do not use floor polish or wax that makes floors slippery. If you must use wax, use non-skid floor wax. ?Do not have throw rugs and other things on the floor that can make you trip. ?What can I do with my stairs? ?Do not leave any items on the stairs. ?Make sure that there are handrails on both sides of the stairs and use them. Fix handrails that are broken or loose. Make sure that handrails are as long as the stairways. ?Check any carpeting to make sure that it is firmly attached to the stairs. Fix any carpet that is loose or worn. ?Avoid having throw rugs at the top or bottom of the stairs.  If you do have throw rugs, attach them to the floor with carpet tape. ?Make sure that you have a light switch at the top of the stairs and the bottom of the stairs. If you do not have them, ask someone to add them for you. ?What else can I do to help prevent falls? ?Wear shoes that: ?Do not have high heels. ?Have rubber bottoms. ?Are comfortable and fit you well. ?Are closed at the toe. Do not wear sandals. ?If you use a stepladder: ?Make sure that it is fully opened. Do not climb a closed stepladder. ?Make sure that both sides of the stepladder are locked into place. ?Ask someone to hold it for you, if possible. ?Clearly mark and make sure that you can see: ?Any grab bars or handrails. ?First and last steps. ?Where the edge of  each step is. ?Use tools that help you move around (mobility aids) if they are needed. These include: ?Canes. ?Walkers. ?Scooters. ?Crutches. ?Turn on the lights when you go into a dark area. Replace a

## 2021-06-27 ENCOUNTER — Ambulatory Visit: Payer: Medicare HMO

## 2021-06-28 ENCOUNTER — Ambulatory Visit (INDEPENDENT_AMBULATORY_CARE_PROVIDER_SITE_OTHER): Payer: Medicare HMO

## 2021-06-28 ENCOUNTER — Encounter: Payer: Self-pay | Admitting: Internal Medicine

## 2021-06-28 DIAGNOSIS — T63441D Toxic effect of venom of bees, accidental (unintentional), subsequent encounter: Secondary | ICD-10-CM

## 2021-06-29 DIAGNOSIS — M8589 Other specified disorders of bone density and structure, multiple sites: Secondary | ICD-10-CM | POA: Diagnosis not present

## 2021-06-29 DIAGNOSIS — Z780 Asymptomatic menopausal state: Secondary | ICD-10-CM | POA: Diagnosis not present

## 2021-06-29 LAB — HM DEXA SCAN

## 2021-06-29 NOTE — Telephone Encounter (Signed)
[facility] called to get verbal order.  Pt was at their office now. ?

## 2021-06-30 ENCOUNTER — Encounter: Payer: Self-pay | Admitting: Internal Medicine

## 2021-07-04 ENCOUNTER — Encounter: Payer: Self-pay | Admitting: Internal Medicine

## 2021-07-27 ENCOUNTER — Ambulatory Visit: Payer: Medicare HMO | Admitting: Allergy

## 2021-07-27 ENCOUNTER — Ambulatory Visit: Payer: Medicare HMO

## 2021-07-27 ENCOUNTER — Encounter: Payer: Self-pay | Admitting: Allergy

## 2021-07-27 VITALS — BP 118/60 | HR 68 | Temp 97.6°F | Resp 17 | Wt 121.0 lb

## 2021-07-27 DIAGNOSIS — L309 Dermatitis, unspecified: Secondary | ICD-10-CM

## 2021-07-27 DIAGNOSIS — J3089 Other allergic rhinitis: Secondary | ICD-10-CM

## 2021-07-27 DIAGNOSIS — T63441D Toxic effect of venom of bees, accidental (unintentional), subsequent encounter: Secondary | ICD-10-CM | POA: Diagnosis not present

## 2021-07-27 DIAGNOSIS — J302 Other seasonal allergic rhinitis: Secondary | ICD-10-CM

## 2021-07-27 MED ORDER — CLOTRIMAZOLE 1 % EX OINT: TOPICAL_OINTMENT | CUTANEOUS | 0 refills | Status: DC

## 2021-07-27 NOTE — Patient Instructions (Addendum)
Dermatitis: Rash is quite red with some fine scale and itchy.  There is some eczematous looking features to the rash and some more concerning features as well.     Rash has not responded to topical steroid thus will try antifungal ointment at this time  She has upcoming dermatology appointment in June as advised they look and biopsy if needed these newer lesions  Bee sting reactions: Continue to avoid bee stings and fire ant. Continue injections.  For mild symptoms you can take over the counter antihistamines such as Benadryl and monitor symptoms closely. If symptoms worsen or if you have severe symptoms including breathing issues, throat closure, significant swelling, whole body hives, severe diarrhea and vomiting, lightheadedness then inject epinephrine and seek immediate medical care afterwards. Emergency action plan in place.  Let us know when ready to start fire ant injections.  Have a medical alert bracelet - for the stinging insects.   Environmental allergies 2021 skin testing positive to tree pollen and dust mites.  Continue environmental control measures as below. May use over the counter antihistamines such as Zyrtec (cetirizine), Claritin (loratadine), Allegra (fexofenadine), or Xyzal (levocetirizine) daily as needed.  Follow up in 12 months or sooner if needed.

## 2021-07-27 NOTE — Progress Notes (Signed)
Follow-up Note  RE: [name] MRN: [id number] DOB: [birth year] Date of Office Visit: 07/27/2021   History of present illness: [name] is a 76 y.o. female presenting today for rash.  She was last seen in the office on 12/22/2020 by Dr. Maudie Mercury.  She has history of hymenoptera allergy and allergic rhinitis. She is on venom immunotherapy tolerating well and has not had any sting do need to use her epinephrine device which she has.  She states this spring she has not had any allergic rhinitis symptoms or need to use any antihistamines or other allergy based medications. Her concern today is a rash on her lower legs bilaterally.  She has history of sqaumous cell carcinoma of her right lower leg and states it started out as a red splotch.  She has surgery on the leg around January.  She states shortly after that she started noticing more red splotches.  It is itchy.  She states someone after the surgery told her it is mostly dry skin.  She has been prescribed triamcinolone to use on these splotches however she is not noting any improvements with use of triamcinolone.  No other household member with similar rash.  She tries not to be outside much do to the hymenoptera allergy.  She does not feel that she has had any stings like ant bites.  She has not had any trouble.  She has no other similar rash besides the legs.  She states she does follow with dermatology every 6 months and has an appointment in June with Dr. Delman Cheadle.  Review of systems: Review of Systems  Constitutional: Negative.   HENT: Negative.    Eyes: Negative.   Respiratory: Negative.    Cardiovascular: Negative.   Gastrointestinal: Negative.   Musculoskeletal: Negative.   Skin:  Positive for rash.  Allergic/Immunologic: Negative.   Neurological: Negative.     All other systems negative unless noted above in HPI  Past medical/social/surgical/family history have been reviewed and are unchanged unless specifically indicated  below.  No changes  Medication List: Current Outpatient Medications  Medication Sig Dispense Refill   amLODipine (NORVASC) 10 MG tablet TAKE ONE TABLET BY MOUTH DAILY 90 tablet 1   atorvastatin (LIPITOR) 20 MG tablet TAKE ONE TABLET BY MOUTH DAILY 90 tablet 1   calcium citrate-vitamin D (CITRACAL+D) 315-200 MG-UNIT per tablet Take 1 tablet by mouth. 600 mg bid     cholecalciferol (VITAMIN D) 1000 units tablet Take 1,000 Units by mouth daily.     Clotrimazole 1 % OINT Apply to affected area twice a day for 1-2 weeks 56 g 0   EPINEPHrine 0.3 mg/0.3 mL IJ SOAJ injection Inject 0.3 mg into the muscle as needed for anaphylaxis. 2 each 1   levothyroxine (SYNTHROID) 50 MCG tablet TAKE ONE TABLET BY MOUTH DAILY BEFORE BREAKFAST 90 tablet 1   Multiple Vitamin (MULTIVITAMIN) tablet Take 1 tablet by mouth daily.     Omega-3 Fatty Acids (FISH OIL) 1200 MG CAPS Take 1 capsule by mouth 2 (two) times daily.     triamcinolone ointment (KENALOG) 0.1 % Apply topically twice daily to BODY as needed for red, sandpaper like rash.  Do not use on face, groin or armpits. 80 g 1   vitamin E 400 UNIT capsule Take 400 Units by mouth daily.     No current facility-administered medications for this visit.     Known medication allergies: Allergies  Allergen Reactions   Bee Venom Anaphylaxis  Wasp, yellow jacket, hornet   Promethazine Hcl     Other reaction(s): Unknown   Moxifloxacin Palpitations     Physical examination (limited): Blood pressure 118/60, pulse 68, temperature 97.6 F (36.4 C), temperature source Temporal, resp. rate 17, weight 121 lb (54.9 kg), SpO2 99 %.  General: Alert, interactive, in no acute distress. Lungs: Clear to auscultation without wheezing, rhonchi or rales. {no increased work of breathing. CV: Normal S1, S2 without murmurs. Skin: Right lower extremity with healed scarring of the anterior right lower leg, posterior right lower leg with scarring and absent muscle.  Right lower  leg with 2 round erythematous papules with a central scab, several erythematous patches with minimal faint scale . Extremities:  No clubbing, cyanosis or edema. Neuro:   Grossly intact.  Diagnositics/Labs: Venom immunotherapy given today  Assessment and plan:   Dermatitis: Rash is quite red with some fine scale and itchy.  There is some eczematous looking features to the rash and some more concerning features as well.     Rash has not responded to topical steroid thus will try antifungal ointment at this time  She has upcoming dermatology appointment in June as advised they look and biopsy if needed these newer lesions  Bee sting reactions: Continue to avoid bee stings and fire ant. Continue injections.  For mild symptoms you can take over the counter antihistamines such as Benadryl and monitor symptoms closely. If symptoms worsen or if you have severe symptoms including breathing issues, throat closure, significant swelling, whole body hives, severe diarrhea and vomiting, lightheadedness then inject epinephrine and seek immediate medical care afterwards. Emergency action plan in place.  Let us know when ready to start fire ant injections.  Have a medical alert bracelet - for the stinging insects.   Environmental allergies 2021 skin testing positive to tree pollen and dust mites.  Continue environmental control measures as below. May use over the counter antihistamines such as Zyrtec (cetirizine), Claritin (loratadine), Allegra (fexofenadine), or Xyzal (levocetirizine) daily as needed.  Follow up in 12 months or sooner if needed.   I appreciate the opportunity to take part in [facility] care. Please do not hesitate to contact me with questions.  Sincerely,   Prudy Feeler, MD Allergy/Immunology [facility]

## 2021-08-06 NOTE — Telephone Encounter (Signed)
Prolia VOB initiated via [email or web address]  Last OV:  Next OV:  Last Prolia inj 03/23/21 Next Prolia inj 09/21/21

## 2021-08-24 ENCOUNTER — Ambulatory Visit (INDEPENDENT_AMBULATORY_CARE_PROVIDER_SITE_OTHER): Payer: Medicare HMO

## 2021-08-24 DIAGNOSIS — T63441D Toxic effect of venom of bees, accidental (unintentional), subsequent encounter: Secondary | ICD-10-CM

## 2021-08-30 DIAGNOSIS — C44722 Squamous cell carcinoma of skin of right lower limb, including hip: Secondary | ICD-10-CM | POA: Diagnosis not present

## 2021-08-30 DIAGNOSIS — L309 Dermatitis, unspecified: Secondary | ICD-10-CM | POA: Diagnosis not present

## 2021-08-30 DIAGNOSIS — D485 Neoplasm of uncertain behavior of skin: Secondary | ICD-10-CM | POA: Diagnosis not present

## 2021-09-07 ENCOUNTER — Telehealth: Payer: Self-pay | Admitting: Internal Medicine

## 2021-09-07 ENCOUNTER — Encounter: Payer: Self-pay | Admitting: Family Medicine

## 2021-09-07 NOTE — Telephone Encounter (Signed)
Pt called stating she was looking to receive her prolia shot on the same day as her 6 month follow up with Dr. Larose Kells (7.19.23). After reviewing her chart, did not see benefits info on OOP cost for appt. Advised pt that a note would be sent back to verify benefits before appt would be made. Pt acknowledged understanding.

## 2021-09-07 NOTE — Telephone Encounter (Signed)
Error

## 2021-09-07 NOTE — Telephone Encounter (Signed)
Is okay to proceed but she will need a BMP ahead of time.  Please arrange

## 2021-09-07 NOTE — Telephone Encounter (Signed)
Can you check to see if she can go ahead and get her Prolia injection on 09/21/21.

## 2021-09-08 NOTE — Telephone Encounter (Signed)
I meant to send to you.  Can you check to see if she can go ahead and get her Prolia injection on 09/21/21.

## 2021-09-11 NOTE — Telephone Encounter (Signed)
Pt ready for scheduling on or after 09/21/21  Out-of-pocket cost due at time of visit: $301  Primary: [employer] Prolia co-insurance: 20% (approximately $276) Admin fee co-insurance: 20% (approximately $25)  Secondary: n/a Prolia co-insurance:  Admin fee co-insurance:   Deductible: does not apply  Prior Auth: APPROVED PA# [id number] Valid 03/26/21-03/25/22  ** This summary of benefits is an estimation of the patient's out-of-pocket cost. Exact cost may vary based on individual plan coverage.

## 2021-09-11 NOTE — Telephone Encounter (Signed)
Prior auth required for PROLIA  PA PROCESS DETAILS: Precertification is required. Call [phone] or complete the Precertification form available at [email or web address] 

## 2021-09-11 NOTE — Telephone Encounter (Signed)
Kem Boroughs D, CMA  You 3 days ago    I meant to send to you.   Can you check to see if she can go ahead and get her Prolia injection on 09/21/21.

## 2021-09-11 NOTE — Telephone Encounter (Signed)
PA APPROVED PA# [id number] Valid 03/26/21-03/25/22

## 2021-09-13 NOTE — Telephone Encounter (Signed)
See below

## 2021-09-13 NOTE — Telephone Encounter (Signed)
Left vm to return call.    

## 2021-09-20 ENCOUNTER — Ambulatory Visit: Payer: Medicare HMO | Admitting: Internal Medicine

## 2021-09-21 ENCOUNTER — Encounter: Payer: Self-pay | Admitting: Internal Medicine

## 2021-09-21 ENCOUNTER — Ambulatory Visit (INDEPENDENT_AMBULATORY_CARE_PROVIDER_SITE_OTHER): Payer: Medicare HMO

## 2021-09-21 ENCOUNTER — Ambulatory Visit (INDEPENDENT_AMBULATORY_CARE_PROVIDER_SITE_OTHER): Payer: Medicare HMO | Admitting: Internal Medicine

## 2021-09-21 VITALS — BP 128/74 | HR 71 | Temp 98.1°F | Resp 16 | Ht 60.0 in | Wt 121.1 lb

## 2021-09-21 DIAGNOSIS — M810 Age-related osteoporosis without current pathological fracture: Secondary | ICD-10-CM | POA: Diagnosis not present

## 2021-09-21 DIAGNOSIS — E039 Hypothyroidism, unspecified: Secondary | ICD-10-CM | POA: Diagnosis not present

## 2021-09-21 DIAGNOSIS — T63441D Toxic effect of venom of bees, accidental (unintentional), subsequent encounter: Secondary | ICD-10-CM

## 2021-09-21 DIAGNOSIS — I10 Essential (primary) hypertension: Secondary | ICD-10-CM | POA: Diagnosis not present

## 2021-09-21 DIAGNOSIS — C4492 Squamous cell carcinoma of skin, unspecified: Secondary | ICD-10-CM

## 2021-09-21 DIAGNOSIS — R739 Hyperglycemia, unspecified: Secondary | ICD-10-CM | POA: Diagnosis not present

## 2021-09-21 LAB — BASIC METABOLIC PANEL
BUN: 11 mg/dL (ref 6–23)
CO2: 29 mEq/L (ref 19–32)
Calcium: 9.2 mg/dL (ref 8.4–10.5)
Chloride: 100 mEq/L (ref 96–112)
Creatinine, Ser: 0.72 mg/dL (ref 0.40–1.20)
GFR: 81.42 mL/min (ref >60.00)
Glucose, Bld: 110 mg/dL — ABNORMAL HIGH (ref 70–99)
Potassium: 3.7 mEq/L (ref 3.5–5.1)
Sodium: 138 mEq/L (ref 135–145)

## 2021-09-21 LAB — TSH: TSH: 0.95 u[IU]/mL (ref 0.35–5.50)

## 2021-09-21 NOTE — Patient Instructions (Addendum)
Please schedule a nurse visit next week for your Prolia- cost at time of injection is $301.  Vaccines I recommend: COVID booster Flu shot this fall   Check the  blood pressure regularly BP GOAL is between 110/65 and  135/85. If it is consistently higher or lower, let me know   GO TO THE LAB : Get the blood work     [place] back for a physical exam by 03-2022

## 2021-09-21 NOTE — Addendum Note (Signed)
Addended byDamita Dunnings D on: 09/21/2021 01:03 PM   Modules accepted: Orders

## 2021-09-21 NOTE — Addendum Note (Signed)
Addended byDamita Dunnings D on: 09/21/2021 02:47 PM   Modules accepted: Orders

## 2021-09-21 NOTE — Progress Notes (Signed)
Subjective:    Patient ID: [name], female    DOB: 1945/04/03, 76 y.o.   MRN: [id number]  DOS:  09/21/2021 Type of visit - description: Follow-up  Since the last office visit, is doing well. She is somewhat concerned about the skin cancer she has. Ambulatory BPs okay. Good med compliance.     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   Osteoporosis    SCC (squamous cell carcinoma)    Urticaria     Past Surgical History:  Procedure Laterality Date   ABDOMINAL HYSTERECTOMY     no oophorectomy   BREAST LUMPECTOMY     x2 left breast   R leg liposarcoma  1973   SQUAMOUS CELL CARCINOMA EXCISION     RLE   TONSILLECTOMY      Current Outpatient Medications  Medication Instructions   amLODipine (NORVASC) 10 MG tablet TAKE ONE TABLET BY MOUTH DAILY   atorvastatin (LIPITOR) 20 MG tablet TAKE ONE TABLET BY MOUTH DAILY   calcium citrate-vitamin D (CITRACAL+D) 315-200 MG-UNIT per tablet 1 tablet   cholecalciferol (VITAMIN D) 1,000 Units, Oral, Daily   Clotrimazole 1 % OINT Apply to affected area twice a day for 1-2 weeks   EPINEPHrine (EPI-PEN) 0.3 mg, Intramuscular, As needed   levothyroxine (SYNTHROID) 50 MCG tablet TAKE ONE TABLET BY MOUTH DAILY BEFORE BREAKFAST   Multiple Vitamin (MULTIVITAMIN) tablet 1 tablet, Oral, Daily,     Omega-3 Fatty Acids (FISH OIL) 1200 MG CAPS 1 capsule, 2 times daily   triamcinolone ointment (KENALOG) 0.1 % Apply topically twice daily to BODY as needed for red, sandpaper like rash.  Do not use on face, groin or armpits.   vitamin E 400 Units, Daily       Objective:   Physical Exam BP 128/74   Pulse 71   Temp 98.1 F (36.7 C) (Oral)   Resp 16   Ht 5' (1.524 m)   Wt 121 lb 2 oz (54.9 kg)   SpO2 95%   BMI 23.66 kg/m  General:   Well developed, NAD, BMI noted. HEENT:  Normocephalic . Face  symmetric, atraumatic Lungs:  CTA B Normal respiratory effort, no intercostal retractions, no accessory muscle use. Heart: RRR,  no murmur.  Lower extremities: no pretibial edema bilaterally  Skin: Not pale. Not jaundice Neurologic:  alert & oriented X3.  Speech normal, gait appropriate for age and unassisted Psych--  Cognition and judgment appear intact.  Cooperative with normal attention span and concentration.  Behavior appropriate. No anxious or depressed appearing.      Assessment    Assessment HTN Hyperlipidemia Hypothyroidism Osteoporosis --T score 2013: -2.6,  T score 02-2014  -2.5 after  Reclast x 3. Last reclast 05-2013. T score 02-2016: -2.4. T score -2.6 (04-2018), Rx Prolia.  --Normal Vit D 2014 SCC, skin cancer, right lower extremity, sees derm q 6 months Liposarcoma right leg 1972 (near ankle, posteriorly)   PLAN: HTN: Currently on amlodipine, BP is very good, check a BMP. Hypothyroidism: On Synthroid, check TSH Squamosal carcinoma: s/p Mohs surgery recently , a second surgery at the R pretibial area is planned.  She is somewhat concerned, counseled. Increased LFTs: Transient elevation of AST ALT reportedly in the context of taking Tylenol. F/u labs were ok Osteoporosis: Schedule Prolia injection  next week. Preventive care: COVID-vaccine and flu shot recommended RTC 03-2018 for CPX

## 2021-09-21 NOTE — Assessment & Plan Note (Signed)
HTN: Currently on amlodipine, BP is very good, check a BMP. Hypothyroidism: On Synthroid, check TSH Squamosal carcinoma: s/p Mohs surgery recently , a second surgery at the R pretibial area is planned.  She is somewhat concerned, counseled. Increased LFTs: Transient elevation of AST ALT reportedly in the context of taking Tylenol. F/u labs were ok Osteoporosis: Schedule Prolia injection next week. Preventive care: COVID-vaccine and flu shot recommended RTC 03-2018 for CPX

## 2021-09-27 DIAGNOSIS — D2272 Melanocytic nevi of left lower limb, including hip: Secondary | ICD-10-CM | POA: Diagnosis not present

## 2021-09-27 DIAGNOSIS — L814 Other melanin hyperpigmentation: Secondary | ICD-10-CM | POA: Diagnosis not present

## 2021-09-27 DIAGNOSIS — D2362 Other benign neoplasm of skin of left upper limb, including shoulder: Secondary | ICD-10-CM | POA: Diagnosis not present

## 2021-09-27 DIAGNOSIS — D225 Melanocytic nevi of trunk: Secondary | ICD-10-CM | POA: Diagnosis not present

## 2021-09-27 DIAGNOSIS — D2261 Melanocytic nevi of right upper limb, including shoulder: Secondary | ICD-10-CM | POA: Diagnosis not present

## 2021-09-27 DIAGNOSIS — D2271 Melanocytic nevi of right lower limb, including hip: Secondary | ICD-10-CM | POA: Diagnosis not present

## 2021-09-27 DIAGNOSIS — D1724 Benign lipomatous neoplasm of skin and subcutaneous tissue of left leg: Secondary | ICD-10-CM | POA: Diagnosis not present

## 2021-09-27 DIAGNOSIS — Z85828 Personal history of other malignant neoplasm of skin: Secondary | ICD-10-CM | POA: Diagnosis not present

## 2021-09-27 DIAGNOSIS — L570 Actinic keratosis: Secondary | ICD-10-CM | POA: Diagnosis not present

## 2021-09-27 DIAGNOSIS — L578 Other skin changes due to chronic exposure to nonionizing radiation: Secondary | ICD-10-CM | POA: Diagnosis not present

## 2021-09-27 NOTE — Telephone Encounter (Signed)
Pts Insurance called with the pt on the "back line" asking why the pt would have to pay $301 OOP at the time of injection.   I explained the cost ($301), coinsurance (20% plus admin fee), and total cost ($1.477) of the injection. The representative then said the pt wanted "exercise her right to have her insurance billed first." I let her know that we set a new policy into play and we no longer bill patients for Prolia- we require payment at time of injection.   I did mention that if cost were an issue we could speak to PCP about an alternate therapy, and that I could speak to Practice Admin about this as well. (Mentioned that Estill Bamberg would be addressed once she is back in the office).

## 2021-09-28 ENCOUNTER — Ambulatory Visit (INDEPENDENT_AMBULATORY_CARE_PROVIDER_SITE_OTHER): Payer: Medicare HMO

## 2021-09-28 DIAGNOSIS — M810 Age-related osteoporosis without current pathological fracture: Secondary | ICD-10-CM | POA: Diagnosis not present

## 2021-09-28 MED ORDER — DENOSUMAB 60 MG/ML ~~LOC~~ SOSY
60.0000 mg | PREFILLED_SYRINGE | Freq: Once | SUBCUTANEOUS | Status: AC
  Administered 2021-09-28: 60 mg via SUBCUTANEOUS

## 2021-09-28 NOTE — Progress Notes (Signed)
[name] is a 76 y.o. female presents to the office today for Prolia  injections, per physician's orders. Original order: Dr. Lucie Leather (med), '60mg'$  (dose),   Left Arm (route) was administered  (location) today. Patient tolerated injection. Patient next injection due:   [name]

## 2021-09-28 NOTE — Telephone Encounter (Signed)
Pt presented in the office today for her Prolia injection and paid the $301.   She says she was advised by insurance that if she overpaid she would be refunded.

## 2021-10-05 ENCOUNTER — Other Ambulatory Visit: Payer: Self-pay | Admitting: Internal Medicine

## 2021-10-10 DIAGNOSIS — C44722 Squamous cell carcinoma of skin of right lower limb, including hip: Secondary | ICD-10-CM | POA: Diagnosis not present

## 2021-10-14 NOTE — Telephone Encounter (Signed)
Last Prolia inj 09/28/21 Next Prolia inj due 04/01/22

## 2021-10-19 ENCOUNTER — Ambulatory Visit (INDEPENDENT_AMBULATORY_CARE_PROVIDER_SITE_OTHER): Payer: Medicare HMO

## 2021-10-19 DIAGNOSIS — T63441D Toxic effect of venom of bees, accidental (unintentional), subsequent encounter: Secondary | ICD-10-CM

## 2021-11-09 ENCOUNTER — Other Ambulatory Visit: Payer: Self-pay | Admitting: Internal Medicine

## 2021-11-10 DIAGNOSIS — L570 Actinic keratosis: Secondary | ICD-10-CM | POA: Diagnosis not present

## 2021-11-10 DIAGNOSIS — D485 Neoplasm of uncertain behavior of skin: Secondary | ICD-10-CM | POA: Diagnosis not present

## 2021-11-10 DIAGNOSIS — L281 Prurigo nodularis: Secondary | ICD-10-CM | POA: Diagnosis not present

## 2021-11-10 DIAGNOSIS — C44722 Squamous cell carcinoma of skin of right lower limb, including hip: Secondary | ICD-10-CM | POA: Diagnosis not present

## 2021-11-16 ENCOUNTER — Ambulatory Visit (INDEPENDENT_AMBULATORY_CARE_PROVIDER_SITE_OTHER): Payer: Medicare HMO

## 2021-11-16 DIAGNOSIS — T63441D Toxic effect of venom of bees, accidental (unintentional), subsequent encounter: Secondary | ICD-10-CM

## 2021-11-24 ENCOUNTER — Other Ambulatory Visit: Payer: Self-pay | Admitting: Internal Medicine

## 2021-11-28 DIAGNOSIS — C44722 Squamous cell carcinoma of skin of right lower limb, including hip: Secondary | ICD-10-CM | POA: Diagnosis not present

## 2021-11-30 DIAGNOSIS — H40023 Open angle with borderline findings, high risk, bilateral: Secondary | ICD-10-CM | POA: Diagnosis not present

## 2021-11-30 DIAGNOSIS — H524 Presbyopia: Secondary | ICD-10-CM | POA: Diagnosis not present

## 2021-11-30 DIAGNOSIS — H2513 Age-related nuclear cataract, bilateral: Secondary | ICD-10-CM | POA: Diagnosis not present

## 2021-11-30 DIAGNOSIS — H5213 Myopia, bilateral: Secondary | ICD-10-CM | POA: Diagnosis not present

## 2021-11-30 DIAGNOSIS — H02834 Dermatochalasis of left upper eyelid: Secondary | ICD-10-CM | POA: Diagnosis not present

## 2021-11-30 DIAGNOSIS — H43813 Vitreous degeneration, bilateral: Secondary | ICD-10-CM | POA: Diagnosis not present

## 2021-11-30 DIAGNOSIS — H52223 Regular astigmatism, bilateral: Secondary | ICD-10-CM | POA: Diagnosis not present

## 2021-12-08 NOTE — Patient Instructions (Addendum)
Rash/pruritus Discussed that I was not sure what was causing the rash Start Claritin (loratadine) 10 mg once a day to twice a day as needed for itching. Caution as this can be sedating Start triamcinolone, that you already have, using 1 application sparingly twice a day as needed to visible rash on chest. Consider lab work if rash/pruritus continues  Bee sting reactions: Continue to avoid bee stings and fire ant. Continue injections.  For mild symptoms you can take over the counter antihistamines such as Benadryl and monitor symptoms closely. If symptoms worsen or if you have severe symptoms including breathing issues, throat closure, significant swelling, whole body hives, severe diarrhea and vomiting, lightheadedness then inject epinephrine and seek immediate medical care afterwards. Emergency action plan in place.  Let us know when ready to start fire ant injections.  Have a medical alert bracelet - for the stinging insects.   Environmental allergies 2021 skin testing positive to tree pollen and dust mites.  Continue environmental control measures as below. May use over the counter antihistamines such as Zyrtec (cetirizine), Claritin (loratadine), Allegra (fexofenadine), or Xyzal (levocetirizine) daily as needed.  Recommend contacting your dermatologist if the area on your right leg where they did the Mohs surgery  Follow up in 2 to 4 weeks or sooner if needed.

## 2021-12-09 ENCOUNTER — Encounter: Payer: Self-pay | Admitting: Family

## 2021-12-09 ENCOUNTER — Ambulatory Visit: Payer: Medicare HMO | Admitting: Family

## 2021-12-09 VITALS — BP 128/76 | HR 82 | Temp 98.1°F | Resp 18 | Wt 123.9 lb

## 2021-12-09 DIAGNOSIS — R21 Rash and other nonspecific skin eruption: Secondary | ICD-10-CM

## 2021-12-09 DIAGNOSIS — Z91038 Other insect allergy status: Secondary | ICD-10-CM

## 2021-12-09 DIAGNOSIS — L299 Pruritus, unspecified: Secondary | ICD-10-CM | POA: Diagnosis not present

## 2021-12-09 DIAGNOSIS — J3089 Other allergic rhinitis: Secondary | ICD-10-CM

## 2021-12-09 DIAGNOSIS — J302 Other seasonal allergic rhinitis: Secondary | ICD-10-CM | POA: Diagnosis not present

## 2021-12-09 NOTE — Progress Notes (Signed)
[place] [address] Dept: [phone]  FOLLOW UP NOTE  Patient ID: [name], female    DOB: [birth year]  Age: 76 y.o. MRN: [id number] Date of Office Visit: 12/09/2021  Assessment  Chief Complaint: No chief complaint on file.  HPI [name] is a 76 year old female who presents today for an acute visit of rash and itching.  She was last seen on Jul 27, 2021 by Dr. Nelva Bush for dermatitis, bee sting reactions, and environmental allergies.  Since her last office visit she reports that she has had 2 Mohs procedures on her right lower leg for squamous cell carcinoma.  Most recent ones being in August and September of this year.  She does mention that the areas that she had the squamous cells removed are in a process of healing but they were concerned about the redness around it.  She was seen paper tape as a part of her bandage and she reports that they feel like the paper tape may be causing the redness around.  She does feel like the area is getting better after stopping the paper tape.  She reports on September 13 she came to get her venom injections and that she always will have slight local itching the day of the venom injection and the next day.  Then on November 26 she received her COVID-vaccine.  She has noticed since her venom injection that she has had itching that then has spread.  She now has itching on her chest, arms, upper legs, groin region, and abdomen.  She does have a rash on her chest but that is the only location.  Her husband feels like it is her immune system that is causing the itching and rash.  She is only taking Benadryl as needed.  It does not help and only makes her sleepy.  She is also using [employer] eczema cream.  She is using Free and clear detergent.  The only new medication she is taking is Latanoprost and she started this 1 week ago due to the pressure in her eye being 19.  She denies any new foods and is not able to correlate the rash/itching with  foods.  She is not using any new products.  No one else in the household has this rash.  She was not sick prior to the rash occurring.  She denies any fevers, chills or joint pain.  The rash on her chest does not ever leave.,  But she does feel like it is better.  She has not had any recent insect bites or tick bites.  She thinks the bruising that she does have on her arms is from scratching.  The rash that she had on her lower legs when she saw Dr. Nelva Bush back in May is a different rash and is better.  Bee sting reactions: She continues to receive venom injections and has not had any insect stings since her last office visit.  She reports that her EpiPen is up-to-date.  Environmental allergies is reported as controlled with no medication at this time.  She denies rhinorrhea, nasal congestion, and postnasal drip.  She has not had any sinus infections since we last saw her.     Drug Allergies:  Allergies  Allergen Reactions   Bee Venom Anaphylaxis    Wasp, yellow jacket, hornet   Promethazine Hcl     Other reaction(s): Unknown   Moxifloxacin Palpitations    Review of Systems: Review of Systems  Constitutional:  Negative for chills and fever.  HENT:         Denies rhinorrhea, nasal congestion, and postnasal drip  Eyes:        Reprts itchy watery eyes.  Used Pataday in the past  Respiratory:  Negative for cough, shortness of breath and wheezing.   Cardiovascular:  Negative for chest pain and palpitations.  Gastrointestinal:        Denies heartburn or reflux symptoms  Genitourinary:  Negative for frequency.  Skin:  Positive for itching and rash.  Neurological:  Negative for headaches.  Endo/Heme/Allergies:  Positive for environmental allergies.     Physical Exam: BP 128/76 (BP Location: Left Arm, Patient Position: Sitting, Cuff Size: Normal)   Pulse 82   Temp 98.1 F (36.7 C) (Temporal)   Resp 18   Wt 123 lb 14.4 oz (56.2 kg)   SpO2 98%   BMI 24.20 kg/m    Physical  Exam Constitutional:      Appearance: Normal appearance.  HENT:     Head: Normocephalic and atraumatic.     Comments: Pharynx normal, eyes normal, ears normal, nose normal    Right Ear: Tympanic membrane, ear canal and external ear normal.     Left Ear: Tympanic membrane, ear canal and external ear normal.     Nose: Nose normal.     Mouth/Throat:     Mouth: Mucous membranes are moist.     Pharynx: Oropharynx is clear.  Eyes:     Conjunctiva/sclera: Conjunctivae normal.  Cardiovascular:     Rate and Rhythm: Normal rate and regular rhythm.     Heart sounds: Normal heart sounds.  Pulmonary:     Effort: Pulmonary effort is normal.     Breath sounds: Normal breath sounds.     Comments: Lungs clear to auscultation Musculoskeletal:     Cervical back: Neck supple.  Skin:    General: Skin is warm.     Comments: Excoriation marks with bruising noted bilateral dorsal aspect of the lower arms.  No rash noted on abdomen and back.  Scabbing with no bleeding or oozing noted on chest  Neurological:     Mental Status: She is alert and oriented to person, place, and time.  Psychiatric:        Mood and Affect: Mood normal.        Behavior: Behavior normal.        Thought Content: Thought content normal.        Judgment: Judgment normal.     Diagnostics:  none  Assessment and Plan: 1. Rash   2. Pruritus   3. Hymenoptera allergy   4. Seasonal and perennial allergic rhinitis     No orders of the defined types were placed in this encounter.   Patient Instructions  Rash/pruritus Discussed that I was not sure what was causing the rash Start Claritin (loratadine) 10 mg once a day to twice a day as needed for itching. Caution as this can be sedating Start triamcinolone, that you already have, using 1 application sparingly twice a day as needed to visible rash on chest. Consider lab work if rash/pruritus continues  Bee sting reactions: Continue to avoid bee stings and fire ant. Continue  injections.  For mild symptoms you can take over the counter antihistamines such as Benadryl and monitor symptoms closely. If symptoms worsen or if you have severe symptoms including breathing issues, throat closure, significant swelling, whole body hives, severe diarrhea and vomiting, lightheadedness then inject epinephrine and seek immediate  medical care afterwards. Emergency action plan in place.  Let us know when ready to start fire ant injections.  Have a medical alert bracelet - for the stinging insects.   Environmental allergies 2021 skin testing positive to tree pollen and dust mites.  Continue environmental control measures as below. May use over the counter antihistamines such as Zyrtec (cetirizine), Claritin (loratadine), Allegra (fexofenadine), or Xyzal (levocetirizine) daily as needed.  Recommend contacting your dermatologist if the area on your right leg where they did the Mohs surgery  Follow up in 2 to 4 weeks or sooner if needed.   Return in about 4 weeks (around 01/06/2022), or if symptoms worsen or fail to improve.    Thank you for the opportunity to care for this patient.  Please do not hesitate to contact me with questions.  Althea Charon, FNP [facility] [place] [place]

## 2021-12-12 DIAGNOSIS — C44722 Squamous cell carcinoma of skin of right lower limb, including hip: Secondary | ICD-10-CM | POA: Diagnosis not present

## 2021-12-14 ENCOUNTER — Ambulatory Visit (INDEPENDENT_AMBULATORY_CARE_PROVIDER_SITE_OTHER): Payer: Medicare HMO

## 2021-12-14 DIAGNOSIS — T63441D Toxic effect of venom of bees, accidental (unintentional), subsequent encounter: Secondary | ICD-10-CM | POA: Diagnosis not present

## 2021-12-26 DIAGNOSIS — C44722 Squamous cell carcinoma of skin of right lower limb, including hip: Secondary | ICD-10-CM | POA: Diagnosis not present

## 2021-12-26 DIAGNOSIS — Z5189 Encounter for other specified aftercare: Secondary | ICD-10-CM | POA: Diagnosis not present

## 2021-12-27 DIAGNOSIS — C44729 Squamous cell carcinoma of skin of left lower limb, including hip: Secondary | ICD-10-CM | POA: Diagnosis not present

## 2021-12-27 DIAGNOSIS — D485 Neoplasm of uncertain behavior of skin: Secondary | ICD-10-CM | POA: Diagnosis not present

## 2021-12-27 DIAGNOSIS — L570 Actinic keratosis: Secondary | ICD-10-CM | POA: Diagnosis not present

## 2021-12-30 ENCOUNTER — Ambulatory Visit: Payer: Medicare HMO | Admitting: Family

## 2022-01-07 ENCOUNTER — Encounter: Payer: Self-pay | Admitting: Internal Medicine

## 2022-01-09 DIAGNOSIS — Z5189 Encounter for other specified aftercare: Secondary | ICD-10-CM | POA: Diagnosis not present

## 2022-01-09 DIAGNOSIS — C44722 Squamous cell carcinoma of skin of right lower limb, including hip: Secondary | ICD-10-CM | POA: Diagnosis not present

## 2022-01-09 DIAGNOSIS — Z8589 Personal history of malignant neoplasm of other organs and systems: Secondary | ICD-10-CM | POA: Diagnosis not present

## 2022-01-11 DIAGNOSIS — H40023 Open angle with borderline findings, high risk, bilateral: Secondary | ICD-10-CM | POA: Diagnosis not present

## 2022-01-12 ENCOUNTER — Ambulatory Visit (INDEPENDENT_AMBULATORY_CARE_PROVIDER_SITE_OTHER): Payer: Medicare HMO | Admitting: Family

## 2022-01-12 ENCOUNTER — Ambulatory Visit: Payer: Self-pay

## 2022-01-12 ENCOUNTER — Ambulatory Visit: Payer: Medicare HMO

## 2022-01-12 ENCOUNTER — Encounter: Payer: Self-pay | Admitting: Family

## 2022-01-12 VITALS — BP 132/64 | HR 82 | Temp 97.7°F | Resp 18

## 2022-01-12 DIAGNOSIS — T63441D Toxic effect of venom of bees, accidental (unintentional), subsequent encounter: Secondary | ICD-10-CM

## 2022-01-12 DIAGNOSIS — L299 Pruritus, unspecified: Secondary | ICD-10-CM

## 2022-01-12 DIAGNOSIS — J3089 Other allergic rhinitis: Secondary | ICD-10-CM | POA: Diagnosis not present

## 2022-01-12 DIAGNOSIS — J302 Other seasonal allergic rhinitis: Secondary | ICD-10-CM

## 2022-01-12 MED ORDER — EPINEPHRINE 0.3 MG/0.3ML IJ SOAJ: 0.3000 mg | INTRAMUSCULAR | 1 refills | Status: AC | PRN

## 2022-01-12 NOTE — Patient Instructions (Addendum)
Rash/pruritus Discussed that I was not sure what was causing the itching Start Zyrtec (cetirizine) 10 mg once a day  for itching. Caution as this can be sedating.  Stop Claritin. Continue triamcinolone, that you already have, using 1 application sparingly twice a day as needed to itchy areas. Do not use longer than 2 weeks in a row. We will order lab work for pruritus. We will call you with results once they are all back  Start daily moisturizing with Cetaphil, Cerave, or Eucerin   Bee sting reactions: Continue to avoid bee stings and fire ant. Continue injections.  For mild symptoms you can take over the counter antihistamines such as Benadryl and monitor symptoms closely. If symptoms worsen or if you have severe symptoms including breathing issues, throat closure, significant swelling, whole body hives, severe diarrhea and vomiting, lightheadedness then inject epinephrine and seek immediate medical care afterwards. Emergency action plan in place.  Let us know when ready to start fire ant injections.  Have a medical alert bracelet - for the stinging insects.   Environmental allergies 2021 skin testing positive to tree pollen and dust mites.  Continue environmental control measures as below. May use over the counter antihistamines such as Zyrtec (cetirizine), Claritin (loratadine), Allegra (fexofenadine), or Xyzal (levocetirizine) daily as needed.   Follow up in 2 to 4 weeks or sooner if needed.

## 2022-01-12 NOTE — Progress Notes (Signed)
[place] [address] Dept: [phone]  FOLLOW UP NOTE  Patient ID: [name], female    DOB: June 28, 1945  Age: 76 y.o. MRN: [id number] Date of Office Visit: 01/12/2022  Assessment  Chief Complaint: Other (Pt states she's been really lately.Marland Kitchen)  HPI [name] is a 76 year old female who presents today for an acute visit of itching.  She was last seen on December 09, 2021 by myself for  rash, pruritus, hymenoptera allergy, and seasonal and perennial allergic rhinitis.  She reports that since her last office visit she has another squamous cell carcinoma area on her left leg.  Rash pruritus: She reports the rash that she did have on her chest is now gone since using triamcinolone.  She still has itching that occurs mainly on her arms.  She also have a little bit of itching on her lower abdomen and back where her moles are.  The itching is not constant it does not occur every day.  She does not ever see a rash.  She does mention that she does have some areas on her arm where the dermatologist has frozen some areas.  Those areas can be a little itchy also.  She has tried using triamcinolone on her itchy areas on her arm and this helps some.  She denies any new medications since the itching started.  She is not using any new products.  She uses [employer] and does a second rinse on all her laundry.  She is not able to correlate any foods with the itching.  No one else in the household is itching.  She was not sick prior to the itching starting.  She has not had any recent bug bites.  She denies any fever, chills, or joint pain.  She feels like the bruising that she has on her arms is from the scratching.  She is not taking the Claritin 10 mg daily as recommended at her last office visit.  She is only taking Claritin as needed.  She is interested in getting lab work to look for the cause for the itching.    Bee sting reactions: She has not had any bee stings since her last office visit  and has not had to use her epinephrine autoinjector device.   She continues to receive venom injections per protocol.  Seasonal and perennial allergic rhinitis: She denies rhinorrhea, nasal congestion, and postnasal drip.  She only takes Claritin as needed.   Drug Allergies:  Allergies  Allergen Reactions   Bee Venom Anaphylaxis    Wasp, yellow jacket, hornet   Promethazine Hcl     Other reaction(s): Unknown   Moxifloxacin Palpitations    Review of Systems: Review of Systems  Constitutional:  Negative for chills and fever.  HENT:         Denies rhinorrhea, nasal congestion, and postnasal drip.  Respiratory:  Negative for cough, shortness of breath and wheezing.   Cardiovascular:  Negative for chest pain and palpitations.  Gastrointestinal:        Denies heartburn or reflux symptoms  Genitourinary:  Negative for frequency.  Skin:  Positive for itching. Negative for rash.  Neurological:  Negative for headaches.  Endo/Heme/Allergies:  Positive for environmental allergies.     Physical Exam: BP 132/64   Pulse 82   Temp 97.7 F (36.5 C) (Temporal)   Resp 18   SpO2 97%    Physical Exam Constitutional:      Appearance: Normal appearance.  HENT:     Head: Normocephalic and atraumatic.     Right Ear: Tympanic membrane, ear canal and external ear normal.     Left Ear: Tympanic membrane, ear canal and external ear normal.     Nose: Nose normal.     Mouth/Throat:     Mouth: Mucous membranes are moist.     Pharynx: Oropharynx is clear.  Eyes:     Conjunctiva/sclera: Conjunctivae normal.  Cardiovascular:     Rate and Rhythm: Regular rhythm.     Heart sounds: Normal heart sounds.  Pulmonary:     Effort: Pulmonary effort is normal.     Breath sounds: Normal breath sounds.     Comments: Lungs clear to auscultation Musculoskeletal:     Cervical back: Neck supple.  Skin:    General: Skin is warm.     Comments: Few small scabbing noted on dorsal aspect of bilateral  forearms.  No oozing, erythema, or bleeding noted.  Small slight bruising noted on right upper arm dorsal aspect.  Small scabbing area noted on lower back with no erythema, oozing, or bleeding.  Multiple seborrheic keratosis noted on back.  Seborrheic keratosis with scabbing noted on lower abdomen  Neurological:     Mental Status: She is alert and oriented to person, place, and time.  Psychiatric:        Mood and Affect: Mood normal.        Behavior: Behavior normal.        Thought Content: Thought content normal.        Judgment: Judgment normal.     Diagnostics: None  Assessment and Plan: 1. Pruritus   2. Toxic effect of venom of bees, unintentional, subsequent encounter   3. Seasonal and perennial allergic rhinitis     Meds ordered this encounter  Medications   EPINEPHrine 0.3 mg/0.3 mL IJ SOAJ injection    Sig: Inject 0.3 mg into the muscle as needed for anaphylaxis.    Dispense:  2 each    Refill:  1    Please place Rx on hold until patient ready for pick up.    Patient Instructions  Rash/pruritus Discussed that I was not sure what was causing the itching Start Zyrtec (cetirizine) 10 mg once a day  for itching. Caution as this can be sedating.  Stop Claritin. Continue triamcinolone, that you already have, using 1 application sparingly twice a day as needed to itchy areas. Do not use longer than 2 weeks in a row. We will order lab work for pruritus. We will call you with results once they are all back  Start daily moisturizing with Cetaphil, Cerave, or Eucerin   Bee sting reactions: Continue to avoid bee stings and fire ant. Continue injections.  For mild symptoms you can take over the counter antihistamines such as Benadryl and monitor symptoms closely. If symptoms worsen or if you have severe symptoms including breathing issues, throat closure, significant swelling, whole body hives, severe diarrhea and vomiting, lightheadedness then inject epinephrine and seek  immediate medical care afterwards. Emergency action plan in place.  Let us know when ready to start fire ant injections.  Have a medical alert bracelet - for the stinging insects.   Environmental allergies 2021 skin testing positive to tree pollen and dust mites.  Continue environmental control measures as below. May use over the counter antihistamines such as Zyrtec (cetirizine), Claritin (loratadine), Allegra (fexofenadine), or Xyzal (levocetirizine) daily as needed.   Follow up in 2 to 4 weeks or  sooner if needed.   Return in about 4 weeks (around 02/09/2022), or if symptoms worsen or fail to improve.    Thank you for the opportunity to care for this patient.  Please do not hesitate to contact me with questions.  Althea Charon, FNP [facility] [place] [facility] [place]

## 2022-01-13 DIAGNOSIS — L299 Pruritus, unspecified: Secondary | ICD-10-CM | POA: Diagnosis not present

## 2022-01-14 LAB — THYROID CASCADE PROFILE: TSH: 1.86 u[IU]/mL (ref 0.450–4.500)

## 2022-01-14 LAB — CBC WITH DIFFERENTIAL
Basophils Absolute: 0.1 10*3/uL (ref 0.0–0.2)
Basos: 1 %
EOS (ABSOLUTE): 0.2 10*3/uL (ref 0.0–0.4)
Eos: 3 %
Hematocrit: 43.2 % (ref 34.0–46.6)
Hemoglobin: 14.1 g/dL (ref 11.1–15.9)
Immature Grans (Abs): 0 10*3/uL (ref 0.0–0.1)
Immature Granulocytes: 0 %
Lymphocytes Absolute: 2.1 10*3/uL (ref 0.7–3.1)
Lymphs: 25 %
MCH: 29.4 pg (ref 26.6–33.0)
MCHC: 32.6 g/dL (ref 31.5–35.7)
MCV: 90 fL (ref 79–97)
Monocytes Absolute: 0.6 10*3/uL (ref 0.1–0.9)
Monocytes: 7 %
Neutrophils Absolute: 5.6 10*3/uL (ref 1.4–7.0)
Neutrophils: 64 %
RBC: 4.8 x10E6/uL (ref 3.77–5.28)
RDW: 12.3 % (ref 11.7–15.4)
WBC: 8.7 10*3/uL (ref 3.4–10.8)

## 2022-01-14 LAB — COMPREHENSIVE METABOLIC PANEL
ALT: 17 IU/L (ref 0–32)
AST: 19 IU/L (ref 0–40)
Albumin/Globulin Ratio: 2 (ref 1.2–2.2)
Albumin: 4.7 g/dL (ref 3.8–4.8)
Alkaline Phosphatase: 77 IU/L (ref 44–121)
BUN/Creatinine Ratio: 17 (ref 12–28)
BUN: 12 mg/dL (ref 8–27)
Bilirubin Total: 0.3 mg/dL (ref 0.0–1.2)
CO2: 26 mmol/L (ref 20–29)
Calcium: 9.5 mg/dL (ref 8.7–10.3)
Chloride: 100 mmol/L (ref 96–106)
Creatinine, Ser: 0.72 mg/dL (ref 0.57–1.00)
Globulin, Total: 2.4 g/dL (ref 1.5–4.5)
Glucose: 81 mg/dL (ref 70–99)
Potassium: 4.4 mmol/L (ref 3.5–5.2)
Sodium: 141 mmol/L (ref 134–144)
Total Protein: 7.1 g/dL (ref 6.0–8.5)
eGFR: 87 mL/min/{1.73_m2} (ref >59)

## 2022-01-16 NOTE — Progress Notes (Signed)
Please let  [name] know that her lab work looking for the cause of her itching is normal. Her cbc with diff is normal. Comprehensive metabolic panel is normal, and thyroid cascade profile is normal.  How is her itching with taking Zyrtec 10 mg daily?

## 2022-01-17 ENCOUNTER — Ambulatory Visit: Payer: Medicare HMO | Admitting: Family

## 2022-01-18 ENCOUNTER — Encounter: Payer: Self-pay | Admitting: Internal Medicine

## 2022-01-18 DIAGNOSIS — Z1231 Encounter for screening mammogram for malignant neoplasm of breast: Secondary | ICD-10-CM | POA: Diagnosis not present

## 2022-01-18 LAB — HM MAMMOGRAPHY

## 2022-02-01 DIAGNOSIS — L988 Other specified disorders of the skin and subcutaneous tissue: Secondary | ICD-10-CM | POA: Diagnosis not present

## 2022-02-01 DIAGNOSIS — L089 Local infection of the skin and subcutaneous tissue, unspecified: Secondary | ICD-10-CM | POA: Diagnosis not present

## 2022-02-01 DIAGNOSIS — C44729 Squamous cell carcinoma of skin of left lower limb, including hip: Secondary | ICD-10-CM | POA: Diagnosis not present

## 2022-02-01 DIAGNOSIS — D489 Neoplasm of uncertain behavior, unspecified: Secondary | ICD-10-CM | POA: Diagnosis not present

## 2022-02-08 ENCOUNTER — Ambulatory Visit: Payer: Medicare HMO

## 2022-02-08 NOTE — Patient Instructions (Incomplete)
Rash-resolved/pruritus (cbc with diff, CMP and thyroid cascade unrevealing) Discussed that I was not sure what was causing the itching Continue Zyrtec (cetirizine) 10 mg once a day  for itching. Caution as this can be sedating.  Continue triamcinolone, that you already have, using 1 application sparingly twice a day as needed to itchy areas. Do not use longer than 2 weeks in a row.  Continue daily moisturizing with Cetaphil, Cerave, or Eucerin  Discussed doing additional lab work/testing due to preliminary lab work was unrevealing.  She would like to deal with her squamous cell carcinoma first.  Discussed getting chest x-ray to look for adenopathy, SPEP, sed rate, HIV, and patch testing  Bee sting reactions: Continue to avoid bee stings and fire ant. Continue injections.  For mild symptoms you can take over the counter antihistamines such as Benadryl and monitor symptoms closely. If symptoms worsen or if you have severe symptoms including breathing issues, throat closure, significant swelling, whole body hives, severe diarrhea and vomiting, lightheadedness then inject epinephrine and seek immediate medical care afterwards. Emergency action plan in place.  Let us know when ready to start fire ant injections.  Have a medical alert bracelet - for the stinging insects.   Environmental allergies 2021 skin testing positive to tree pollen and dust mites.  Continue environmental control measures as below. May use over the counter antihistamines such as Zyrtec (cetirizine), Claritin (loratadine), Allegra (fexofenadine), or Xyzal (levocetirizine) daily as needed.  Recommend continued follow-up with dermatology due to the erythema/itching/swelling on the right lower leg after having multiple squamous cells removed  She wishes to follow-up after the holidays and will call and schedule an appointment

## 2022-02-09 ENCOUNTER — Ambulatory Visit: Payer: Medicare HMO

## 2022-02-09 ENCOUNTER — Ambulatory Visit: Payer: Medicare HMO | Admitting: Family

## 2022-02-09 ENCOUNTER — Other Ambulatory Visit: Payer: Self-pay

## 2022-02-09 ENCOUNTER — Encounter: Payer: Self-pay | Admitting: Family

## 2022-02-09 VITALS — BP 128/64 | HR 81 | Temp 98.1°F | Resp 20 | Wt 125.7 lb

## 2022-02-09 DIAGNOSIS — J3089 Other allergic rhinitis: Secondary | ICD-10-CM

## 2022-02-09 DIAGNOSIS — T63441D Toxic effect of venom of bees, accidental (unintentional), subsequent encounter: Secondary | ICD-10-CM | POA: Diagnosis not present

## 2022-02-09 DIAGNOSIS — L299 Pruritus, unspecified: Secondary | ICD-10-CM

## 2022-02-09 NOTE — Progress Notes (Signed)
[facility] [id number] Dept: [phone]  FOLLOW UP NOTE  Patient ID: [name], female    DOB: [birth year]  Age: 76 y.o. MRN: [id number] Date of Office Visit: 02/09/2022  Assessment  Chief Complaint: Rash and Follow-up  HPI [name] is a 76 year old female who presents today for follow-up of rash/pruritus, bee sting reaction, environmental allergies.  She was last seen on January 12, 2022 by myself.  She reports that since her last office visit she has had another squamous cell carcinoma removed.  Pruritus: She reports that she continue his to have itching, but it is better since adding an antihistamine daily and moisturizing daily with CeraVe.  She continues to have itching on her arms, her back, and her chest region.  She reports that when started she did not have any rash on her arms or back. She had a rash on her chest that went away.  She feels like the redness she has now on her arms is due to scratching.  She denies any fever, weight loss, night sweats, new medications, recent travel, and other family members having rash.  She is using All Free and Clear with her laundry and double rinses her laundry.  She uses [employer] and Dove shampoo.  She is not using new products.  She is not able to correlate the itching with foods.  Discussed how her preliminary lab work (CBC with differential, comprehensive metabolic panel, and thyroid cascade) were unremarkable.  Offered doing additional testing such as chest x-ray, serum protein electrophoresis, sed rate, HIV, and patch testing.  She would like to hold off in any additional testing for now due to wanting to get past her "squamous cell stuff" and then if still bothersome she will pursue the further testing I recommended.  She also mentions that she still has the area on her right lower leg that has been red after having multiple squamous cells removed.  The area is also swollen and is itchy at times.  She was told at first that  she was allergic to the tape.  She has been going on since July.  She is on an antibiotic that she does not know the name of.  After reviewing epic it looks like she is currently taking doxycycline.  She also mentions that around 50 years ago she had a skin graft on the right backside of her lower leg and was told that she now has a compression ulcer and needs to see a wound specialist.  She has a follow-up appointment with dermatology on the 21st.  Bee sting actions: She has not had any bee stings since her last office visit and her epinephrine autoinjector device is up-to-date.  She continues to receive venom injections per protocol.  She is not interested in receiving fire ant injections at this time.  Seasonal and perennial allergic rhinitis: She reports clear rhinorrhea in the morning when she blows her nose.  She denies nasal congestion and postnasal drip.  She is currently taking an antihistamine once a day.  She has not had any sinus infections since we last saw her.  Drug Allergies:  Allergies  Allergen Reactions   Bee Venom Anaphylaxis    Wasp, yellow jacket, hornet   Promethazine Hcl     Other reaction(s): Unknown   Moxifloxacin Palpitations    Review of Systems: Review of Systems  Constitutional:  Negative for chills, fever and weight loss.  HENT:  Reports clear rhinorrhea when she blows her nose in the morning.  Denies nasal congestion and postnasal drip  Eyes:        Reports that she has the beginnings of glaucoma and has started drops that can cause dry eyes.  She uses Systane eyedrops.  Respiratory:  Negative for cough, shortness of breath and wheezing.   Cardiovascular:  Negative for chest pain and palpitations.  Gastrointestinal:        Denies heartburn or reflux symptoms  Genitourinary:  Negative for frequency.  Skin:  Positive for itching.       Reports itching on arms, back, and chest  Neurological:  Negative for headaches.  Endo/Heme/Allergies:  Positive  for environmental allergies.     Physical Exam: BP 128/64 (BP Location: Left Arm, Patient Position: Sitting, Cuff Size: Normal)   Pulse 81   Temp 98.1 F (36.7 C) (Temporal)   Resp 20   Wt 125 lb 11.2 oz (57 kg)   SpO2 99%   BMI 24.55 kg/m    Physical Exam Constitutional:      Appearance: Normal appearance.  HENT:     Head: Normocephalic and atraumatic.     Comments: Pharynx normal, eyes normal, ears normal, nose normal    Right Ear: Tympanic membrane, ear canal and external ear normal.     Left Ear: Tympanic membrane, ear canal and external ear normal.     Nose: Nose normal.     Mouth/Throat:     Mouth: Mucous membranes are moist.     Pharynx: Oropharynx is clear.  Eyes:     Conjunctiva/sclera: Conjunctivae normal.  Cardiovascular:     Rate and Rhythm: Normal rate and regular rhythm.     Heart sounds: Normal heart sounds.  Pulmonary:     Effort: Pulmonary effort is normal.     Breath sounds: Normal breath sounds.     Comments: Lungs clear to auscultation Musculoskeletal:     Cervical back: Neck supple.  Skin:    General: Skin is warm.     Comments: Erythema with edema noted on right lower extremity.  Small erythematous macules noted on bilateral dorsal aspect of arms  Neurological:     Mental Status: She is alert and oriented to person, place, and time.  Psychiatric:        Mood and Affect: Mood normal.        Behavior: Behavior normal.        Thought Content: Thought content normal.        Judgment: Judgment normal.     Diagnostics: None  Assessment and Plan: 1. Pruritus   2. Toxic effect of venom of bees, unintentional, subsequent encounter   3. Seasonal and perennial allergic rhinitis     No orders of the defined types were placed in this encounter.   Patient Instructions  Rash-resolved/pruritus (cbc with diff, CMP and thyroid cascade unrevealing) Discussed that I was not sure what was causing the itching Continue Zyrtec (cetirizine) 10 mg once a  day  for itching. Caution as this can be sedating.  Continue triamcinolone, that you already have, using 1 application sparingly twice a day as needed to itchy areas. Do not use longer than 2 weeks in a row.  Continue daily moisturizing with Cetaphil, Cerave, or Eucerin  Discussed doing additional lab work/testing due to preliminary lab work was unrevealing.  She would like to deal with her squamous cell carcinoma first.  Discussed getting chest x-ray to look for adenopathy, SPEP, sed rate,  HIV, and patch testing  Bee sting reactions: Continue to avoid bee stings and fire ant. Continue injections.  For mild symptoms you can take over the counter antihistamines such as Benadryl and monitor symptoms closely. If symptoms worsen or if you have severe symptoms including breathing issues, throat closure, significant swelling, whole body hives, severe diarrhea and vomiting, lightheadedness then inject epinephrine and seek immediate medical care afterwards. Emergency action plan in place.  Let us know when ready to start fire ant injections.  Have a medical alert bracelet - for the stinging insects.   Environmental allergies 2021 skin testing positive to tree pollen and dust mites.  Continue environmental control measures as below. May use over the counter antihistamines such as Zyrtec (cetirizine), Claritin (loratadine), Allegra (fexofenadine), or Xyzal (levocetirizine) daily as needed.  Recommend continued follow-up with dermatology due to the erythema/itching/swelling on the right lower leg after having multiple squamous cells removed  She wishes to follow-up after the holidays and will call and schedule an appointment  No follow-ups on file.    Thank you for the opportunity to care for this patient.  Please do not hesitate to contact me with questions.  Althea Charon, FNP [facility]

## 2022-02-15 NOTE — Telephone Encounter (Signed)
Forwarding to rx prior auth team.

## 2022-02-16 NOTE — Telephone Encounter (Signed)
Prolia VOB initiated via [email or web address] 

## 2022-02-23 DIAGNOSIS — L432 Lichenoid drug reaction: Secondary | ICD-10-CM | POA: Diagnosis not present

## 2022-02-23 DIAGNOSIS — C44722 Squamous cell carcinoma of skin of right lower limb, including hip: Secondary | ICD-10-CM | POA: Diagnosis not present

## 2022-02-23 DIAGNOSIS — C44622 Squamous cell carcinoma of skin of right upper limb, including shoulder: Secondary | ICD-10-CM | POA: Diagnosis not present

## 2022-02-23 DIAGNOSIS — D485 Neoplasm of uncertain behavior of skin: Secondary | ICD-10-CM | POA: Diagnosis not present

## 2022-02-23 DIAGNOSIS — L309 Dermatitis, unspecified: Secondary | ICD-10-CM | POA: Diagnosis not present

## 2022-03-01 ENCOUNTER — Encounter (HOSPITAL_BASED_OUTPATIENT_CLINIC_OR_DEPARTMENT_OTHER): Payer: Medicare HMO | Attending: General Surgery | Admitting: General Surgery

## 2022-03-01 DIAGNOSIS — Z923 Personal history of irradiation: Secondary | ICD-10-CM | POA: Diagnosis not present

## 2022-03-01 DIAGNOSIS — Z85831 Personal history of malignant neoplasm of soft tissue: Secondary | ICD-10-CM | POA: Insufficient documentation

## 2022-03-01 DIAGNOSIS — L02415 Cutaneous abscess of right lower limb: Secondary | ICD-10-CM | POA: Diagnosis not present

## 2022-03-01 DIAGNOSIS — I10 Essential (primary) hypertension: Secondary | ICD-10-CM | POA: Insufficient documentation

## 2022-03-01 DIAGNOSIS — L97812 Non-pressure chronic ulcer of other part of right lower leg with fat layer exposed: Secondary | ICD-10-CM | POA: Insufficient documentation

## 2022-03-01 NOTE — Progress Notes (Signed)
[name] ([id number]) [id number] Page 1 of 4 Visit Report for 03/01/2022 Abuse Risk Screen Details Patient Name: Date of Service: [name], [name] 03/01/2022 8:00 A M Medical Record Number: [id number] Patient Account Number: [id number] Date of Birth/Sex: Treating RN: [birth year] (76 y.o. [name] Primary Care Gresia Isidoro: [name] Other Clinician: Referring Dewanna Hurston: Treating Jaydalee Bardwell/Extender: Kathreen Cosier in Treatment: 0 Abuse Risk Screen Items Answer ABUSE RISK SCREEN: Has anyone close to you tried to hurt or harm you recentlyo No Do you feel uncomfortable with anyone in your familyo No Has anyone forced you do things that you didnt want to doo No Electronic Signature(s) Signed: 03/01/2022 5:27:24 PM By: Dellie Catholic RN Entered By: Dellie Catholic on 03/01/2022 08:31:33 -------------------------------------------------------------------------------- Activities of Daily Living Details Patient Name: Date of Service: [name] 03/01/2022 8:00 A M Medical Record Number: [id number] Patient Account Number: [id number] Date of Birth/Sex: Treating RN: 04-25-1945 (76 y.o. [name] Primary Care Sulo Janczak: [name] Other Clinician: Referring Mateus Rewerts: Treating Wirt Hemmerich/Extender: Kathreen Cosier in Treatment: 0 Activities of Daily Living Items Answer Activities of Daily Living (Please select one for each item) Drive Automobile Completely Able T Medications ake Completely Able Use T elephone Completely Able Care for Appearance Completely Able Use T oilet Completely Able Bath / Shower Completely Able Dress Self Completely Able Feed Self Completely Able Walk Completely Able Get In / Out Bed Completely Able Housework Completely Able Prepare Meals Completely Able Handle Money Completely Able Shop for Self Completely Able Electronic Signature(s) Signed: 03/01/2022 5:27:24 PM By:  Dellie Catholic RN Entered By: Dellie Catholic on 03/01/2022 08:32:01 JLYNN, LY ([id number]) [id number] Page 2 of 4 -------------------------------------------------------------------------------- Education Screening Details Patient Name: Date of Service: [name], [name] 03/01/2022 8:00 A M Medical Record Number: [id number] Patient Account Number: [id number] Date of Birth/Sex: Treating RN: [birth year] (76 y.o. [name] Primary Care Mayur Duman: [name] Other Clinician: Referring Ellysia Char: Treating Edwina Grossberg/Extender: Kathreen Cosier in Treatment: 0 Learning Preferences/Education Level/Primary Language Learning Preference: Explanation, Demonstration, Printed Material Highest Education Level: College or Above Preferred Language: English Cognitive Barrier Language Barrier: No Translator Needed: No Memory Deficit: No Emotional Barrier: No Cultural/Religious Beliefs Affecting Medical Care: No Physical Barrier Impaired Vision: No Impaired Hearing: No Decreased Hand dexterity: No Knowledge/Comprehension Knowledge Level: High Comprehension Level: High Ability to understand written instructions: High Ability to understand verbal instructions: High Motivation Anxiety Level: Calm Cooperation: Cooperative Education Importance: Acknowledges Need Interest in Health Problems: Asks Questions Perception: Coherent Willingness to Engage in Self-Management High Activities: Readiness to Engage in Self-Management High Activities: Electronic Signature(s) Signed: 03/01/2022 5:27:24 PM By: Dellie Catholic RN Entered By: Dellie Catholic on 03/01/2022 08:32:55 -------------------------------------------------------------------------------- Fall Risk Assessment Details Patient Name: Date of Service: Misty O RE, [name] 03/01/2022 8:00 A M Medical Record Number: [id number] Patient Account Number: [id number] Date of Birth/Sex: Treating  RN: 1945-11-06 (76 y.o. [name] Primary Care Ramiz Turpin: [name] Other Clinician: Referring Mazelle Huebert: Treating Jorrell Kuster/Extender: Kathreen Cosier in Treatment: 0 Fall Risk Assessment Items Have you had 2 or more falls in the last 12 monthso 0 No Have you had any fall that resulted in injury in the last 12 monthso 0 No SHARETTA, RICCHIO V ([id number]) [phone] [id number] Page 3 of 4 FALLS RISK SCREEN History of falling - immediate or within 3 months 0 No Secondary diagnosis (Do you have 2 or more medical diagnoseso) 0 No Ambulatory  aid None/bed rest/wheelchair/nurse 0 No Crutches/cane/walker 0 No Furniture 0 No Intravenous therapy Access/Saline/Heparin Lock 0 No Gait/Transferring Normal/ bed rest/ wheelchair 0 No Weak (short steps with or without shuffle, stooped but able to lift head while walking, may seek 0 No support from furniture) Impaired (short steps with shuffle, may have difficulty arising from chair, head down, impaired 0 No balance) Mental Status Oriented to own ability 0 No Electronic Signature(s) Signed: 03/01/2022 5:27:24 PM By: Dellie Catholic RN Entered By: Dellie Catholic on 03/01/2022 08:33:03 -------------------------------------------------------------------------------- Foot Assessment Details Patient Name: Date of Service: [name], [name] 03/01/2022 8:00 A M Medical Record Number: [id number] Patient Account Number: [id number] Date of Birth/Sex: Treating RN: 16-Jul-1945 (76 y.o. [name] Primary Care Monic Engelmann: [name] Other Clinician: Referring Darus Hershman: Treating Jackelyne Sayer/Extender: Kathreen Cosier in Treatment: 0 Foot Assessment Items Site Locations + = Sensation present, - = Sensation absent, C = Callus, U = Ulcer R = Redness, W = Warmth, M = Maceration, PU = Pre-ulcerative lesion F = Fissure, S = Swelling, D = Dryness Assessment Right: Left: Other Deformity: No  No Prior Foot Ulcer: No No Prior Amputation: No No Charcot Joint: No No Ambulatory Status: Ambulatory Without Help GaitJARYN, HOCUTT ([id number]) [phone].pdf Page 4 of 4 Electronic Signature(s) Signed: 03/01/2022 5:27:24 PM By: Dellie Catholic RN Entered By: Dellie Catholic on 03/01/2022 08:36:31 -------------------------------------------------------------------------------- Nutrition Risk Screening Details Patient Name: Date of Service: [place], FRANCISCO EYERLY 03/01/2022 8:00 A M Medical Record Number: [id number] Patient Account Number: [id number] Date of Birth/Sex: Treating RN: Jul 12, 1945 (76 y.o. [name] Primary Care Nuri Branca: [name] Other Clinician: Referring Jacalynn Buzzell: Treating Patrica Mendell/Extender: Kathreen Cosier in Treatment: 0 Height (in): 60 Weight (lbs): 126 Body Mass Index (BMI): 24.6 Nutrition Risk Screening Items Score Screening NUTRITION RISK SCREEN: I have an illness or condition that made me change the kind and/or amount of food I eat 0 No I eat fewer than two meals per day 0 No I eat few fruits and vegetables, or milk products 0 No I have three or more drinks of beer, liquor or wine almost every day 0 No I have tooth or mouth problems that make it hard for me to eat 0 No I don't always have enough money to buy the food I need 0 No I eat alone most of the time 0 No I take three or more different prescribed or over-the-counter drugs a day 0 No Without wanting to, I have lost or gained 10 pounds in the last six months 0 No I am not always physically able to shop, cook and/or feed myself 0 No Nutrition Protocols Good Risk Protocol 0 No interventions needed Moderate Risk Protocol High Risk Proctocol Risk Level: Good Risk Score: 0 Electronic Signature(s) Signed: 03/01/2022 5:27:24 PM By: Dellie Catholic RN Entered By: Dellie Catholic on 03/01/2022 08:33:12

## 2022-03-01 NOTE — Progress Notes (Signed)
MAHLANI, BERNINGER ([id number]) [id number] Page 1 of 9 Visit Report for 03/01/2022 Allergy List Details Patient Name: Date of Service: [name], [name] 03/01/2022 8:00 A M Medical Record Number: [id number] Patient Account Number: [id number] Date of Birth/Sex: Treating RN: Apr 05, 1945 (76 y.o. [name] Primary Care Tymara Saur: [name] Other Clinician: Referring Kishaun Erekson: Treating Natasha Burda/Extender: Kathreen Cosier in Treatment: 0 Allergies Active Allergies bee venom protein (honey bee) Severity: Severe moxifloxacin Severity: Moderate promethazine HCl Severity: Moderate Allergy Notes Electronic Signature(s) Signed: 03/01/2022 5:27:24 PM By: Dellie Catholic RN Entered By: Dellie Catholic on 03/01/2022 08:17:30 -------------------------------------------------------------------------------- Arrival Information Details Patient Name: Date of Service: [place], [name] 03/01/2022 8:00 A M Medical Record Number: [id number] Patient Account Number: [id number] Date of Birth/Sex: Treating RN: [birth year] (76 y.o. F) Primary Care Jacobus Colvin: [name] Other Clinician: Referring Pilar Westergaard: Treating Martena Emanuele/Extender: Kathreen Cosier in Treatment: 0 Visit Information Patient Arrived: Ambulatory Arrival Time: 07:55 Accompanied By: husband Transfer Assistance: None Patient Identification Verified: Yes Secondary Verification Process Completed: Yes Patient Requires Transmission-Based Precautions: No Electronic Signature(s) Signed: 03/01/2022 10:04:53 AM By: Worthy Rancher Entered By: Worthy Rancher on 03/01/2022 07:55:28 [name] ([id number]) [id number] Page 2 of 9 -------------------------------------------------------------------------------- Clinic Level of Care Assessment Details Patient Name: Date of Service: [name], [name] 03/01/2022 8:00 A M Medical Record Number: [id number] Patient Account  Number: [id number] Date of Birth/Sex: Treating RN: 10-02-1945 (76 y.o. [name] Primary Care Janetta Vandoren: [name] Other Clinician: Referring Mollye Guinta: Treating Letta Cargile/Extender: Kathreen Cosier in Treatment: 0 Clinic Level of Care Assessment Items TOOL 1 Quantity Score X- 1 0 Use when EandM and Procedure is performed on INITIAL visit ASSESSMENTS - Nursing Assessment / Reassessment X- 1 20 General Physical Exam (combine w/ comprehensive assessment (listed just below) when performed on new pt. evals) X- 1 25 Comprehensive Assessment (HX, ROS, Risk Assessments, Wounds Hx, etc.) ASSESSMENTS - Wound and Skin Assessment / Reassessment X- 1 10 Dermatologic / Skin Assessment (not related to wound area) ASSESSMENTS - Ostomy and/or Continence Assessment and Care '[]'$  - 0 Incontinence Assessment and Management '[]'$  - 0 Ostomy Care Assessment and Management (repouching, etc.) PROCESS - Coordination of Care X - Simple Patient / Family Education for ongoing care 1 15 '[]'$  - 0 Complex (extensive) Patient / Family Education for ongoing care X- 1 10 Staff obtains Programmer, systems, Records, T Results / Process Orders est X- 1 10 Staff telephones HHA, Nursing Homes / Clarify orders / etc '[]'$  - 0 Routine Transfer to another Facility (non-emergent condition) '[]'$  - 0 Routine Hospital Admission (non-emergent condition) X- 1 15 New Admissions / Biomedical engineer / Ordering NPWT Apligraf, etc. , '[]'$  - 0 Emergency Hospital Admission (emergent condition) PROCESS - Special Needs '[]'$  - 0 Pediatric / Minor Patient Management '[]'$  - 0 Isolation Patient Management '[]'$  - 0 Hearing / Language / Visual special needs '[]'$  - 0 Assessment of Community assistance (transportation, D/C planning, etc.) '[]'$  - 0 Additional assistance / Altered mentation '[]'$  - 0 Support Surface(s) Assessment (bed, cushion, seat, etc.) INTERVENTIONS - Miscellaneous '[]'$  - 0 External ear exam '[]'$  - 0 Patient  Transfer (multiple staff / Civil Service fast streamer / Similar devices) '[]'$  - 0 Simple Staple / Suture removal (25 or less) '[]'$  - 0 Complex Staple / Suture removal (26 or more) '[]'$  - 0 Hypo/Hyperglycemic Management (do not check if billed separately) X- 1 15 Ankle / Brachial Index (ABI) - do not check if billed separately Has  the patient been seen at the hospital within the last three years: Yes Total Score: 120 Level Of Care: New/Established - Level 4 Electronic Signature(s) Signed: 03/01/2022 5:27:24 PM By: Dellie Catholic RN Laurance Flatten, Cathlin [name] ([id number]) [id number] Page 3 of 9 Signed: 03/01/2022 5:27:24 PM By: Dellie Catholic RN Entered By: Dellie Catholic on 03/01/2022 17:16:07 -------------------------------------------------------------------------------- Encounter Discharge Information Details Patient Name: Date of Service: [place], [name] 03/01/2022 8:00 A M Medical Record Number: [id number] Patient Account Number: [id number] Date of Birth/Sex: Treating RN: [birth year] (76 y.o. [name] Primary Care Drena Ham: [name] Other Clinician: Referring Capers Hagmann: Treating Raegan Sipp/Extender: Kathreen Cosier in Treatment: 0 Encounter Discharge Information Items Post Procedure Vitals Discharge Condition: Stable Temperature (F): 97.6 Ambulatory Status: Ambulatory Pulse (bpm): 75 Discharge Destination: Home Respiratory Rate (breaths/min): 20 Transportation: Private Auto Blood Pressure (mmHg): 168/79 Accompanied By: self Schedule Follow-up Appointment: Yes Clinical Summary of Care: Patient Declined Electronic Signature(s) Signed: 03/01/2022 5:27:24 PM By: Dellie Catholic RN Entered By: Dellie Catholic on 03/01/2022 17:17:38 -------------------------------------------------------------------------------- Lower Extremity Assessment Details Patient Name: Date of Service: [name], [name] 03/01/2022 8:00 A M Medical Record Number: [id number] Patient  Account Number: [id number] Date of Birth/Sex: Treating RN: 1945/10/18 (76 y.o. [name] Primary Care Mamadou Breon: [name] Other Clinician: Referring Aron Needles: Treating Blase Beckner/Extender: Kathreen Cosier in Treatment: 0 Edema Assessment Assessed: [Left: No] [Right: No] [Left: Edema] [Right: :] Calf Left: Right: Point of Measurement: 33 cm From Medial Instep 32 cm Ankle Left: Right: Point of Measurement: 9 cm From Medial Instep 19.5 cm Knee To Floor Left: Right: From Medial Instep 39 cm Vascular Assessment Pulses: Dorsalis Pedis Palpable: [Right:Yes] Blood Pressure: YESLY, GERETY [name] ([id number]) [[id number] Page 4 of 9] Brachial: [Right:168] Ankle: [Right:Dorsalis Pedis: 158 0.94] Electronic Signature(s) Signed: 03/01/2022 5:27:24 PM By: Dellie Catholic RN Entered By: Dellie Catholic on 03/01/2022 08:46:02 -------------------------------------------------------------------------------- Multi Wound Chart Details Patient Name: Date of Service: [place], [name] 03/01/2022 8:00 A M Medical Record Number: [id number] Patient Account Number: [id number] Date of Birth/Sex: Treating RN: [birth year] (76 y.o. F) Primary Care Shirel Mallis: [name] Other Clinician: Referring Frankye Schwegel: Treating Lakresha Stifter/Extender: Kathreen Cosier in Treatment: 0 Vital Signs Height(in): 60 Pulse(bpm): 75 Weight(lbs): 126 Blood Pressure(mmHg): 168/79 Body Mass Index(BMI): 24.6 Temperature(F): 97.6 Respiratory Rate(breaths/min): 20 [1:Photos:] [N/A:N/A] Right Achilles N/A N/A Wound Location: Gradually Appeared N/A N/A Wounding Event: Abscess N/A N/A Primary Etiology: Asthma, Hypertension, Received N/A N/A Comorbid History: Radiation 08/30/2021 N/A N/A Date Acquired: 0 N/A N/A Weeks of Treatment: Open N/A N/A Wound Status: No N/A N/A Wound Recurrence: 2.5x3.4x0.1 N/A N/A Measurements L x W x D (cm) 6.676 N/A N/A A  (cm) : rea 0.668 N/A N/A Volume (cm) : Full Thickness Without Exposed N/A N/A Classification: Support Structures Medium N/A N/A Exudate A mount: Serosanguineous N/A N/A Exudate Type: red, brown N/A N/A Exudate Color: Small (1-33%) N/A N/A Granulation A mount: Red N/A N/A Granulation Quality: Large (67-100%) N/A N/A Necrotic A mount: Fat Layer (Subcutaneous Tissue): Yes N/A N/A Exposed Structures: Fascia: No Tendon: No Muscle: No Joint: No Bone: No Small (1-33%) N/A N/A Epithelialization: Debridement - Excisional N/A N/A Debridement: Pre-procedure Verification/Time Out 09:02 N/A N/A Taken: Lidocaine 4% Topical Solution N/A N/A Pain Control: Subcutaneous, Slough N/A N/A Tissue Debrided: Skin/Subcutaneous Tissue N/A N/A Level: 8.5 N/A N/A Debridement A (sq cm): rea Curette N/A N/A InstrumentBURDETTE, GERGELY ([id number]) [id number] Page 5 of 9 Minimum N/A N/A Bleeding: Pressure N/A N/A  Hemostasis A chieved: 0 N/A N/A Procedural Pain: 0 N/A N/A Post Procedural Pain: Procedure was tolerated well N/A N/A Debridement Treatment Response: 2.5x3.4x0.1 N/A N/A Post Debridement Measurements L x W x D (cm) 0.668 N/A N/A Post Debridement Volume: (cm) Scarring: Yes N/A N/A Periwound Skin Texture: No Abnormalities Noted N/A N/A Periwound Skin Moisture: No Abnormalities Noted N/A N/A Periwound Skin Color: No Abnormality N/A N/A Temperature: Pt. stated that she had urticaria on N/A N/A Assessment Notes: legs,arms and torso Debridement N/A N/A Procedures Performed: Treatment Notes Electronic Signature(s) Signed: 03/01/2022 9:55:09 AM By: Fredirick Maudlin MD FACS Entered By: Fredirick Maudlin on 03/01/2022 09:55:09 -------------------------------------------------------------------------------- Multi-Disciplinary Care Plan Details Patient Name: Date of Service: [place], Nelma [name]. 03/01/2022 8:00 A M Medical Record Number:  [id number] Patient Account Number: [id number] Date of Birth/Sex: Treating RN: [birth year] (76 y.o. [name] Primary Care Penn Grissett: [name] Other Clinician: Referring Ayham Word: Treating Genella Bas/Extender: Kathreen Cosier in Treatment: 0 Active Inactive Wound/Skin Impairment Nursing Diagnoses: Knowledge deficit related to ulceration/compromised skin integrity Goals: Patient/caregiver will verbalize understanding of skin care regimen Date Initiated: 03/01/2022 Target Resolution Date: 05/04/2022 Goal Status: Active Interventions: Assess ulceration(s) every visit Treatment Activities: Skin care regimen initiated : 03/01/2022 Notes: Electronic Signature(s) Signed: 03/01/2022 5:27:24 PM By: Dellie Catholic RN Entered By: Dellie Catholic on 03/01/2022 17:12:12 -------------------------------------------------------------------------------- Pain Assessment Details Patient Name: Date of Service: [name], [facility] 03/01/2022 8:00 A LIYLA[place], [name] ([id number]) [id number] Page 6 of 9 Medical Record Number: [id number] Patient Account Number: [id number] Date of Birth/Sex: Treating RN: [birth year] (76 y.o. [name] Primary Care Lynette Noah: [name] Other Clinician: Referring Kaithlyn Teagle: Treating Geraldina Parrott/Extender: Kathreen Cosier in Treatment: 0 Active Problems Location of Pain Severity and Description of Pain Patient Has Paino Yes Site Locations Pain Location: Pain in Ulcers With Dressing Change: No Duration of the Pain. Constant / Intermittento Constant Rate the pain. Current Pain Level: 1 Worst Pain Level: 10 Least Pain Level: 1 Tolerable Pain Level: 1 Character of Pain Describe the Pain: Tender Pain Management and Medication Current Pain Management: Medication: No Cold Application: No Rest: Yes Massage: No Activity: No T.E.N.S.: No Heat Application: No Leg drop or elevation: No Is the  Current Pain Management Adequate: Adequate How does your wound impact your activities of daily livingo Sleep: No Bathing: No Appetite: No Relationship With Others: No Bladder Continence: No Emotions: No Bowel Continence: No Work: No Toileting: No Drive: No Dressing: No Hobbies: No Electronic Signature(s) Signed: 03/01/2022 5:27:24 PM By: Dellie Catholic RN Entered By: Dellie Catholic on 03/01/2022 08:53:19 -------------------------------------------------------------------------------- Patient/Caregiver Education Details Patient Name: Date of Service: [name] 12/27/2023andnbsp8:00 A M Medical Record Number: [id number] Patient Account Number: [id number] Date of Birth/Gender: Treating RN: Jul 31, 1945 (76 y.o. [name] Primary Care Physician: [name] Other Clinician: Referring Physician: Treating Physician/Extender: Kathreen Cosier in Treatment: 0 Education Assessment Education Provided To: Patient JOLAN, MEALOR ([id number]) [id number] Page 7 of 9 Education Topics Provided Wound/Skin Impairment: Methods: Explain/Verbal Responses: Return demonstration correctly Electronic Signature(s) Signed: 03/01/2022 5:27:24 PM By: Dellie Catholic RN Entered By: Dellie Catholic on 03/01/2022 17:12:25 -------------------------------------------------------------------------------- Wound Assessment Details Patient Name: Date of Service: [place], [name] 03/01/2022 8:00 A M Medical Record Number: [id number] Patient Account Number: [id number] Date of Birth/Sex: Treating RN: May 02, 1945 (76 y.o. [name] Primary Care Etter Royall: [name] Other Clinician: Referring Ania Levay: Treating Lennette Fader/Extender: Kathreen Cosier in Treatment: 0 Wound  Status Wound Number: 1 Primary Etiology: Abscess Wound Location: Right Achilles Wound Status: Open Wounding Event: Gradually Appeared Comorbid History: Asthma,  Hypertension, Received Radiation Date Acquired: 08/30/2021 Weeks Of Treatment: 0 Clustered Wound: No Photos Wound Measurements Length: (cm) 2.5 Width: (cm) 3.4 Depth: (cm) 0.1 Area: (cm) 6.676 Volume: (cm) 0.668 % Reduction in Area: % Reduction in Volume: Epithelialization: Small (1-33%) Tunneling: No Undermining: No Wound Description Classification: Full Thickness Without Exposed Support Structures Exudate Amount: Medium Exudate Type: Serosanguineous Exudate Color: red, brown Foul Odor After Cleansing: No Slough/Fibrino Yes Wound Bed Granulation Amount: Small (1-33%) Exposed Structure Granulation Quality: Red Fascia Exposed: No Necrotic Amount: Large (67-100%) Fat Layer (Subcutaneous Tissue) Exposed: Yes Necrotic Quality: Adherent Slough Tendon Exposed: No Muscle Exposed: No Joint Exposed: No Bone Exposed: No [address] KYASIA, STEUCK [name] ([id number]) [id number].pdf Page 8 of 9 Texture Color No Abnormalities Noted: Yes No Abnormalities Noted: Yes Moisture Temperature / Pain No Abnormalities Noted: Yes Temperature: No Abnormality Assessment Notes Pt. stated that she had urticaria on legs,arms and torso Treatment Notes Wound #1 (Achilles) Wound Laterality: Right Cleanser Soap and Water Discharge Instruction: May shower and wash wound with dial antibacterial soap and water prior to dressing change. Wound Cleanser Discharge Instruction: Cleanse the wound with wound cleanser prior to applying a clean dressing using gauze sponges, not tissue or cotton balls. Peri-Wound Care Triamcinolone 15 (g) Discharge Instruction: Use triamcinolone 15 (g) as directed Sween Lotion (Moisturizing lotion) Discharge Instruction: Apply moisturizing lotion as directed Topical Primary Dressing IODOFLEX 0.9% Cadexomer Iodine Pad 4x6 cm Discharge Instruction: Apply to wound bed as instructed Secondary Dressing Woven Gauze Sponge, Non-Sterile 4x4  in Discharge Instruction: Apply over primary dressing as directed. Zetuvit Plus 4x8 in Discharge Instruction: Apply over primary dressing as directed. Secured With Compression Wrap Kerlix Roll 4.5x3.1 (in/yd) Discharge Instruction: Apply Kerlix and Coban compression as directed. Coban Self-Adherent Wrap 4x5 (in/yd) Discharge Instruction: Apply over Kerlix as directed. Tubular Netting #5 Compression Stockings Add-Ons Electronic Signature(s) Signed: 03/01/2022 5:27:24 PM By: Dellie Catholic RN Entered By: Dellie Catholic on 03/01/2022 08:55:29 -------------------------------------------------------------------------------- Vitals Details Patient Name: Date of Service: [name], [name] 03/01/2022 8:00 A M Medical Record Number: [id number] Patient Account Number: [id number] Date of Birth/Sex: Treating RN: 11-Sep-1945 (76 y.o. F) Primary Care Taft Worthing: [name] Other Clinician: Referring Konner Saiz: Treating Mitzi Lilja/Extender: Kathreen Cosier in Treatment: 0 Vital Signs Time Taken: 07:55 Temperature (F): 97.6 Height (in): 60 Pulse (bpm): [address] V ([id number]) [id number] Page 9 of 9 Weight (lbs): 126 Respiratory Rate (breaths/min): 20 Body Mass Index (BMI): 24.6 Blood Pressure (mmHg): 168/79 Reference Range: 80 - 120 mg / dl Electronic Signature(s) Signed: 03/01/2022 5:27:24 PM By: Dellie Catholic RN Entered By: Dellie Catholic on 03/01/2022 08:16:10

## 2022-03-01 NOTE — Progress Notes (Signed)
[name] ([id number]) [id number] Page 1 of 9 Visit Report for 03/01/2022 Chief Complaint Document Details Patient Name: Date of Service: [name], [name] 03/01/2022 8:00 A M Medical Record Number: [id number] Patient Account Number: [id number] Date of Birth/Sex: Treating RN: 1946-02-28 (76 y.o. F) Primary Care Provider: Kathlene [name] Other Clinician: Referring Provider: Treating Provider/Extender: [name] in Treatment: 0 Information Obtained from: Patient Chief Complaint Patient seen for complaints of Non-Healing Wound. Electronic Signature(s) Signed: 03/01/2022 9:55:20 AM By: Misty Maudlin MD FACS Entered By: [name] on 03/01/2022 09:55:20 -------------------------------------------------------------------------------- Debridement Details Patient Name: Date of Service: [name] 03/01/2022 8:00 A M Medical Record Number: [id number] Patient Account Number: [id number] Date of Birth/Sex: Treating RN: 25-Feb-1946 (76 y.o. [name] Primary Care Provider: Kathlene [name] Other Clinician: Referring Provider: Treating Provider/Extender: [name] in Treatment: 0 Debridement Performed for Assessment: Wound #1 Right Achilles Performed By: Physician Misty Maudlin, MD Debridement Type: Debridement Level of Consciousness (Pre-procedure): Awake and Alert Pre-procedure Verification/Time Out Yes - 09:02 Taken: Start Time: 09:02 Pain Control: Lidocaine 4% T opical Solution T Area Debrided (L x W): otal 2.5 (cm) x 3.4 (cm) = 8.5 (cm) Tissue and other material debrided: Non-Viable, Slough, Subcutaneous, Slough Level: Skin/Subcutaneous Tissue Debridement Description: Excisional Instrument: Curette Bleeding: Minimum Hemostasis Achieved: Pressure End Time: 09:04 Procedural Pain: 0 Post Procedural Pain: 0 Response to Treatment: Procedure was tolerated well Level of Consciousness (Post-  Awake and Alert procedure): Post Debridement Measurements of Total Wound Length: (cm) 2.5 Width: (cm) 3.4 Depth: (cm) 0.1 Volume: (cm) 0.668 Character of Wound/Ulcer Post Debridement: Improved Post Procedure Diagnosis [name], [name] ([id number]) [id number] Page 2 of 9 Same as Pre-procedure Notes Scribed for Dr. Celine [name] by [name] Electronic Signature(s) Signed: 03/01/2022 12:13:53 PM By: Misty Maudlin MD FACS Signed: 03/01/2022 5:27:24 PM By: Misty Catholic RN Entered By: [name] on 03/01/2022 09:11:57 -------------------------------------------------------------------------------- HPI Details Patient Name: Date of Service: [name] 03/01/2022 8:00 A M Medical Record Number: [id number] Patient Account Number: [id number] Date of Birth/Sex: Treating RN: 1945-10-11 (76 y.o. F) Primary Care Provider: Kathlene [name] Other Clinician: Referring Provider: Treating Provider/Extender: [name] in Treatment: 0 History of Present Illness HPI Description: ADMISSION 03/01/2022 This is a 76 year old otherwise fairly healthy woman (not diabetic, not obese, does not smoke) who presents to the clinic with an ulcer on her right posterior leg. She has a history of a liposarcoma excised in 1973. The patient is not sure but believes she received radiation therapy to the location. She did have a skin graft. About 6 months ago, she developed a small ulcer in the area that seemed to start as an abscess. She has had an open wound in that area since then. A shave biopsy was taken by a dermatologist that showed inflammation without evidence of malignancy. She has been applying Vaseline to the site along with periwound TCA. ABI in clinic today is 0.94. Due to the failure of the wound to heal properly, she has been referred to the wound care center for further evaluation and management. Electronic Signature(s) Signed: 03/01/2022 9:57:46 AM  By: Misty Maudlin MD FACS Entered By: [name] on 03/01/2022 09:57:45 -------------------------------------------------------------------------------- Physical Exam Details Patient Name: Date of Service: [name] 03/01/2022 8:00 A M Medical Record Number: [id number] Patient Account Number: [id number] Date of Birth/Sex: Treating RN: 1945-05-05 (76 y.o. F) Primary Care Provider: Kathlene [name] Other Clinician: Referring Provider: Treating Provider/Extender:  [name] in Treatment: 0 Constitutional Hypertensive, asymptomatic.. . . . No acute distress. Respiratory Normal work of breathing on room air.. Cardiovascular .Marland Kitchen Notes 03/01/2022: On her posterior right calf, at the distal aspect of where her sarcoma resection and skin graft were, there is a relatively circular ulcer with a thick layer of rubbery slough on the surface. Underneath this, there is good granulation tissue. No malodor or purulent drainage. Periwound skin is a little bit dry. No significant edema. [name], [name] ([id number]) [id number] Page 3 of 9 Electronic Signature(s) Signed: 03/01/2022 10:04:38 AM By: Misty Maudlin MD FACS Previous Signature: 03/01/2022 9:58:15 AM Version By: Misty Maudlin MD FACS Entered By: [name] on 03/01/2022 10:04:37 -------------------------------------------------------------------------------- Physician Orders Details Patient Name: Date of Service: [name], [name] 03/01/2022 8:00 A M Medical Record Number: [id number] Patient Account Number: [id number] Date of Birth/Sex: Treating RN: [birth year] (76 y.o. [name] Primary Care Provider: Kathlene [name] Other Clinician: Referring Provider: Treating Provider/Extender: [name] in Treatment: 0 Verbal / Phone Orders: No Diagnosis Coding ICD-10 Coding Code Description [phone] Non-pressure chronic ulcer of other part of right lower  leg with fat layer exposed I10 Essential (primary) hypertension Z85.831 Personal history of malignant neoplasm of soft tissue Z92.3 Personal history of irradiation Follow-up Appointments ppointment in 1 week. - Dr. Celine [name] Room 3 Return A Anesthetic (In clinic) Topical Lidocaine 4% applied to wound bed - Used in clinic Bathing/ Shower/ Hygiene May shower with protection but do not get wound dressing(s) wet. Protect dressing(s) with water repellant cover (for example, large plastic bag) or a cast cover and may then take shower. - Please do not get leg wraps wet on Right Leg. May use a cast protector on right leg- can purchase from [employer]; Medical supply store; [facility] etc. the cast protector costs approximately $18-$29 Edema Control - Lymphedema / SCD / Other void standing for long periods of time. - Rest and elevate legs throughout the day. A Moisturize legs daily. - Use a lotion or moisturizer for example :Sween,Eucerin;Aquaphor etc. Wound Treatment Wound #1 - Achilles Wound Laterality: Right Cleanser: Soap and Water 1 x Per Week/30 Days Discharge Instructions: May shower and wash wound with dial antibacterial soap and water prior to dressing change. Cleanser: Wound Cleanser 1 x Per Week/30 Days Discharge Instructions: Cleanse the wound with wound cleanser prior to applying a clean dressing using gauze sponges, not tissue or cotton balls. Peri-Wound Care: Triamcinolone 15 (g) 1 x Per Week/30 Days Discharge Instructions: Use triamcinolone 15 (g) as directed Peri-Wound Care: Sween Lotion (Moisturizing lotion) 1 x Per Week/30 Days Discharge Instructions: Apply moisturizing lotion as directed Prim Dressing: IODOFLEX 0.9% Cadexomer Iodine Pad 4x6 cm 1 x Per Week/30 Days ary Discharge Instructions: Apply to wound bed as instructed Secondary Dressing: Woven Gauze Sponge, Non-Sterile 4x4 in 1 x Per Week/30 Days Discharge Instructions: Apply over primary dressing as directed. Secondary Dressing:  Zetuvit Plus 4x8 in 1 x Per Week/30 Days Discharge Instructions: Apply over primary dressing as directed. Compression Wrap: Kerlix Roll 4.5x3.1 (in/yd) 1 x Per Week/30 Days Discharge Instructions: Apply Kerlix and Coban compression as directed. Compression Wrap: Coban Self-Adherent Wrap 4x5 (in/yd) 1 x Per Week/30 Days Discharge Instructions: Apply over Kerlix as directed. [name], [name] ([id number]) [id number] Page 4 of 9 Compression Wrap: Tubular Netting #5 1 x Per Week/30 Days Electronic Signature(s) Signed: 03/01/2022 12:13:53 PM By: Misty Maudlin MD FACS Entered By: [name] on 03/01/2022 10:04:57 -------------------------------------------------------------------------------- Problem List  Details Patient Name: Date of Service: [name], [name] 03/01/2022 8:00 A M Medical Record Number: [id number] Patient Account Number: [id number] Date of Birth/Sex: Treating RN: [birth year] (76 y.o. F) Primary Care Provider: Kathlene [name] Other Clinician: Referring Provider: Treating Provider/Extender: [name] in Treatment: 0 Active Problems ICD-10 Encounter Code Description Active Date MDM Diagnosis [phone] Non-pressure chronic ulcer of other part of right lower leg with fat layer 03/01/2022 No Yes exposed [place] (primary) hypertension 03/01/2022 No Yes Z85.831 Personal history of malignant neoplasm of soft tissue 03/01/2022 No Yes Z92.3 Personal history of irradiation 03/01/2022 No Yes Inactive Problems Resolved Problems Electronic Signature(s) Signed: 03/01/2022 9:55:01 AM By: Misty Maudlin MD FACS Previous Signature: 03/01/2022 8:26:06 AM Version By: Misty Maudlin MD FACS Entered By: [name] on 03/01/2022 09:55:01 -------------------------------------------------------------------------------- Progress Note Details Patient Name: Date of Service: [name], [name] 03/01/2022 8:00 A M Medical Record Number:  [id number] Patient Account Number: [id number] Date of Birth/Sex: Treating RN: 1945/09/04 (76 y.o. F) Primary Care Provider: Kathlene [name] Other Clinician: Referring Provider: Treating Provider/Extender: [name] in Treatment: [id number], [name] ([id number]) [id number] Page 5 of 9 Subjective Chief Complaint Information obtained from Patient Patient seen for complaints of Non-Healing Wound. History of Present Illness (HPI) ADMISSION 03/01/2022 This is a 76 year old otherwise fairly healthy woman (not diabetic, not obese, does not smoke) who presents to the clinic with an ulcer on her right posterior leg. She has a history of a liposarcoma excised in 1973. The patient is not sure but believes she received radiation therapy to the location. She did have a skin graft. About 6 months ago, she developed a small ulcer in the area that seemed to start as an abscess. She has had an open wound in that area since then. A shave biopsy was taken by a dermatologist that showed inflammation without evidence of malignancy. She has been applying Vaseline to the site along with periwound TCA. ABI in clinic today is 0.94. Due to the failure of the wound to heal properly, she has been referred to the wound care center for further evaluation and management. Patient History Information obtained from Patient. Allergies bee venom protein (honey bee) (Severity: Severe), moxifloxacin (Severity: Moderate), promethazine HCl (Severity: Moderate) Family History Unknown History. Social History Never smoker, Marital Status - Married, Alcohol Use - Never, Drug Use - No History, Caffeine Use - Never. Medical History Respiratory Patient has history of Asthma - Childhood asthma Cardiovascular Patient has history of Hypertension Oncologic Patient has history of Received Radiation - R/T Liposarcoma in 1972 Hospitalization/Surgery History - Left Breast  Lumpectomy;Tonsillectomy ; Squamous cell carcinoma; Hysterectomy;Right Leg liposarcoma. Medical A Surgical History Notes nd Eyes Pt. states she has pre-glaucoma Cardiovascular Hx: Angio-edema r/t Bee venom; Hyperlipidemia Endocrine Hx: hypothyroidism Immunological Hx: Currently being treated with Immunotherapy (r/t Bee-Venom) Integumentary (Skin) Hx: Urticaria Musculoskeletal Hx: Osteoporosis Oncologic 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)-Biopsy taken to rule out squamous cell carcinoma on right arm and right leg (02/24/22), still pending results Review of Systems (ROS) Constitutional Symptoms (General Health) Denies complaints or symptoms of Fatigue, Fever, Chills, Marked Weight Change. Ear/Nose/Mouth/Throat Denies complaints or symptoms of Chronic sinus problems or rhinitis. Gastrointestinal Denies complaints or symptoms of Frequent diarrhea, Nausea, Vomiting. Genitourinary Denies complaints or symptoms of Frequent urination. Integumentary (Skin) Complains or has symptoms of Wounds - Right posterior leg. Neurologic Denies complaints or symptoms of Numbness/parasthesias. Psychiatric Denies complaints or symptoms of Claustrophobia. Objective Constitutional Hypertensive,  asymptomatic.Marland Kitchen No acute distress. Vitals Time Taken: 7:55 AM, Height: 60 in, Weight: 126 lbs, BMI: 24.6, Temperature: 97.6 F, Pulse: 75 bpm, Respiratory Rate: 20 breaths/min, Blood Pressure: 168/79 mmHg. [name], [name] ([id number]) [id number] Page 6 of 9 Respiratory Normal work of breathing on room air.. General Notes: 03/01/2022: On her posterior right calf, at the distal aspect of where her sarcoma resection and skin graft were, there is a relatively circular ulcer with a thick layer of rubbery slough on the surface. Underneath this, there is good granulation tissue. No malodor or purulent drainage. Periwound skin is a little bit dry. No  significant edema. Integumentary (Hair, Skin) Wound #1 status is Open. Original cause of wound was Gradually Appeared. The date acquired was: 08/30/2021. The wound is located on the Right Achilles. The wound measures 2.5cm length x 3.4cm width x 0.1cm depth; 6.676cm^2 area and 0.668cm^3 volume. There is Fat Layer (Subcutaneous Tissue) exposed. There is no tunneling or undermining noted. There is a medium amount of serosanguineous drainage noted. There is small (1-33%) red granulation within the wound bed. There is a large (67-100%) amount of necrotic tissue within the wound bed including Adherent Slough. The periwound skin appearance had no abnormalities noted for texture. The periwound skin appearance had no abnormalities noted for moisture. The periwound skin appearance had no abnormalities noted for color. Periwound temperature was noted as No Abnormality. General Notes: Pt. stated that she had urticaria on legs,arms and torso Assessment Active Problems ICD-10 Non-pressure chronic ulcer of other part of right lower leg with fat layer exposed Essential (primary) hypertension Personal history of malignant neoplasm of soft tissue Personal history of irradiation Procedures Wound #1 Pre-procedure diagnosis of Wound #1 is an Abscess located on the Right Achilles . There was a Excisional Skin/Subcutaneous Tissue Debridement with a total area of 8.5 sq cm performed by Misty Maudlin, MD. With the following instrument(s): Curette to remove Non-Viable tissue/material. Material removed includes Subcutaneous Tissue and Slough and after achieving pain control using Lidocaine 4% T opical Solution. No specimens were taken. A time out was conducted at 09:02, prior to the start of the procedure. A Minimum amount of bleeding was controlled with Pressure. The procedure was tolerated well with a pain level of 0 throughout and a pain level of 0 following the procedure. Post Debridement Measurements: 2.5cm  length x 3.4cm width x 0.1cm depth; 0.668cm^3 volume. Character of Wound/Ulcer Post Debridement is improved. Post procedure Diagnosis Wound #1: Same as Pre-Procedure General Notes: Scribed for Dr. Celine [name] by [name] Plan Follow-up Appointments: Return Appointment in 1 week. - Dr. Celine [name] Room 3 Anesthetic: (In clinic) Topical Lidocaine 4% applied to wound bed - Used in clinic Bathing/ Shower/ Hygiene: May shower with protection but do not get wound dressing(s) wet. Protect dressing(s) with water repellant cover (for example, large plastic bag) or a cast cover and may then take shower. - Please do not get leg wraps wet on Right Leg. May use a cast protector on right leg- can purchase from [employer]; Medical supply store; [facility] etc. the cast protector costs approximately $18-$29 Edema Control - Lymphedema / SCD / Other: Avoid standing for long periods of time. - Rest and elevate legs throughout the day. Moisturize legs daily. - Use a lotion or moisturizer for example :Sween,Eucerin;Aquaphor etc. WOUND #1: - Achilles Wound Laterality: Right Cleanser: Soap and Water 1 x Per Week/30 Days Discharge Instructions: May shower and wash wound with dial antibacterial soap and water prior to dressing change. Cleanser: Wound Cleanser 1  x Per Week/30 Days Discharge Instructions: Cleanse the wound with wound cleanser prior to applying a clean dressing using gauze sponges, not tissue or cotton balls. Peri-Wound Care: Triamcinolone 15 (g) 1 x Per Week/30 Days Discharge Instructions: Use triamcinolone 15 (g) as directed Peri-Wound Care: Sween Lotion (Moisturizing lotion) 1 x Per Week/30 Days Discharge Instructions: Apply moisturizing lotion as directed Prim Dressing: IODOFLEX 0.9% Cadexomer Iodine Pad 4x6 cm 1 x Per Week/30 Days ary Discharge Instructions: Apply to wound bed as instructed Secondary Dressing: Woven Gauze Sponge, Non-Sterile 4x4 in 1 x Per Week/30 Days Discharge Instructions: Apply over primary  dressing as directed. Secondary Dressing: Zetuvit Plus 4x8 in 1 x Per Week/30 Days Discharge Instructions: Apply over primary dressing as directed. Com pression Wrap: Kerlix Roll 4.5x3.1 (in/yd) 1 x Per Week/30 Days Discharge Instructions: Apply Kerlix and Coban compression as directed. Com pression Wrap: Coban Self-Adherent Wrap 4x5 (in/yd) 1 x Per Week/30 Days Discharge Instructions: Apply over Kerlix as directed. Com pression Wrap: Tubular Netting #5 1 x Per Week/30 Days [name], [name] ([id number]) [id number] Page 7 of 9 03/01/2022: This is a 76 year old woman with a remote history of liposarcoma excision, likely external beam radiation therapy, and skin graft application. She has had an ulcer o at the distal aspect of this area for about 6 months. n her posterior right calf, at the distal aspect of where her sarcoma resection and skin graft were, there is a relatively circular ulcer with a thick layer of rubbery slough on the surface. Underneath this, there is good granulation tissue. No malodor or purulent drainage. Periwound skin is a little bit dry. No significant edema. I used a curette to debride slough and nonviable subcutaneous tissue from the wound. I think this wound will benefit from further chemical debridement, so we will apply Iodoflex. She does not have significant edema so I am only going to use Kerlix and Coban for now. The biopsy that was taken was only a shave biopsy so if we do not see reasonable improvement in the wound in the next couple of [name], I will likely take a deeper section to confirm that she does not have recurrence of liposarcoma or any radiation-induced malignancy, such as angiosarcoma. Follow-up in 1 week. Electronic Signature(s) Signed: 03/01/2022 10:06:38 AM By: Misty Maudlin MD FACS Previous Signature: 03/01/2022 90:24:09 AM Version By: Misty Maudlin MD FACS Entered By: [name] on 03/01/2022  10:06:38 -------------------------------------------------------------------------------- HxROS Details Patient Name: Date of Service: [name], [name] 03/01/2022 8:00 A M Medical Record Number: [id number] Patient Account Number: [id number] Date of Birth/Sex: Treating RN: 1946-01-09 (76 y.o. [name] Primary Care Provider: Kathlene [name] Other Clinician: Referring Provider: Treating Provider/Extender: [name] in Treatment: 0 Information Obtained From Patient Constitutional Symptoms (General Health) Complaints and Symptoms: Negative for: Fatigue; Fever; Chills; Marked Weight Change Ear/Nose/Mouth/Throat Complaints and Symptoms: Negative for: Chronic sinus problems or rhinitis Gastrointestinal Complaints and Symptoms: Negative for: Frequent diarrhea; Nausea; Vomiting Genitourinary Complaints and Symptoms: Negative for: Frequent urination Integumentary (Skin) Complaints and Symptoms: Positive for: Wounds - Right posterior leg Medical History: Past Medical History Notes: Hx: Urticaria Neurologic Complaints and Symptoms: Negative for: Numbness/parasthesias Psychiatric Complaints and Symptoms: Negative for: Claustrophobia Eyes Medical HistoryINGRI, DIEMER ([id number]) [phone].pdf Page 8 of 9 Past Medical History Notes: Pt. states she has pre-glaucoma Hematologic/Lymphatic Respiratory Medical History: Positive for: Asthma - Childhood asthma Cardiovascular Medical History: Positive for: Hypertension Past Medical History Notes: Hx: Angio-edema r/t Bee venom; Hyperlipidemia Endocrine Medical  History: Past Medical History Notes: Hx: hypothyroidism Immunological Medical History: Past Medical History Notes: Hx: Currently being treated with Immunotherapy (r/t Bee-Venom) Musculoskeletal Medical History: Past Medical History Notes: Hx: Osteoporosis Oncologic Medical History: Positive for: Received Radiation -  R/T Liposarcoma in 1972 Past Medical History Notes: 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)-Biopsy taken to rule out squamous cell carcinoma on right arm and right leg (02/24/22), still pending results Immunizations Pneumococcal Vaccine: Received Pneumococcal Vaccination: Yes Received Pneumococcal Vaccination On or After 60th Birthday: Yes Implantable Devices No devices added Hospitalization / Surgery History Type of Hospitalization/Surgery Left Breast Lumpectomy;Tonsillectomy ; Squamous cell carcinoma; Hysterectomy;Right Leg liposarcoma Family and Social History Unknown History: Yes; Never smoker; Marital Status - Married; Alcohol Use: Never; Drug Use: No History; Caffeine Use: Never; Financial Concerns: No; Food, Clothing or Shelter Needs: No; Support System Lacking: No; Transportation Concerns: No Electronic Signature(s) Signed: 03/01/2022 8:36:05 AM By: Misty Maudlin MD FACS Signed: 03/01/2022 5:27:24 PM By: Misty Catholic RN Entered By: [name] on 03/01/2022 08:31:24 -------------------------------------------------------------------------------- SuperBill Details Patient Name: Date of Service: [place], [name] DRISCOLL 03/01/2022 Medical Record Number: [id number] Patient Account Number: [id number] [name], [name] ([id number]) [id number] Page 9 of 9 Date of Birth/Sex: Treating RN: [birth year] (76 y.o. F) Primary Care Provider: Kathlene [name] Other Clinician: Referring Provider: Treating Provider/Extender: [name] in Treatment: 0 Diagnosis Coding ICD-10 Codes Code Description [phone] Non-pressure chronic ulcer of other part of right lower leg with fat layer exposed I10 Essential (primary) hypertension Z85.831 Personal history of malignant neoplasm of soft tissue Z92.3 Personal history of irradiation Facility Procedures : CPT4 Code: [id number] Description: [place] VISIT-LEV 4 EST  PT Modifier: 25 Quantity: 1 : CPT4 Code: [id number] Description: [place] - DEB SUBQ TISSUE 20 SQ CM/< ICD-10 Diagnosis Description V03.500 Non-pressure chronic ulcer of other part of right lower leg with fat layer expos Modifier: ed Quantity: 1 Physician Procedures : CPT4 Code Description Modifier [id number] - WC PHYS LEVEL 4 - NEW PT 25 ICD-10 Diagnosis Description L97.812 Non-pressure chronic ulcer of other part of right lower leg with fat layer exposed Z85.831 Personal history of malignant neoplasm of soft  tissue Z92.3 Personal history of irradiation I10 Essential (primary) hypertension Quantity: 1 : [id number] - WC PHYS SUBQ TISS 20 SQ CM ICD-10 Diagnosis Description Z02.585 Non-pressure chronic ulcer of other part of right lower leg with fat layer exposed Quantity: 1 Electronic Signature(s) Signed: 03/01/2022 5:27:24 PM By: Misty Catholic RN Signed: 03/02/2022 7:55:29 AM By: Misty Maudlin MD FACS Previous Signature: 03/01/2022 10:06:54 AM Version By: Misty Maudlin MD FACS Entered By: [name] on 03/01/2022 17:16:18

## 2022-03-08 DIAGNOSIS — Z5189 Encounter for other specified aftercare: Secondary | ICD-10-CM | POA: Diagnosis not present

## 2022-03-08 DIAGNOSIS — Z8589 Personal history of malignant neoplasm of other organs and systems: Secondary | ICD-10-CM | POA: Diagnosis not present

## 2022-03-09 ENCOUNTER — Other Ambulatory Visit (HOSPITAL_BASED_OUTPATIENT_CLINIC_OR_DEPARTMENT_OTHER): Payer: Self-pay | Admitting: General Surgery

## 2022-03-09 ENCOUNTER — Ambulatory Visit (INDEPENDENT_AMBULATORY_CARE_PROVIDER_SITE_OTHER): Payer: Medicare HMO

## 2022-03-09 ENCOUNTER — Encounter (HOSPITAL_BASED_OUTPATIENT_CLINIC_OR_DEPARTMENT_OTHER): Payer: Medicare HMO | Attending: General Surgery | Admitting: General Surgery

## 2022-03-09 DIAGNOSIS — I10 Essential (primary) hypertension: Secondary | ICD-10-CM | POA: Insufficient documentation

## 2022-03-09 DIAGNOSIS — L98499 Non-pressure chronic ulcer of skin of other sites with unspecified severity: Secondary | ICD-10-CM | POA: Diagnosis not present

## 2022-03-09 DIAGNOSIS — L97812 Non-pressure chronic ulcer of other part of right lower leg with fat layer exposed: Secondary | ICD-10-CM | POA: Diagnosis not present

## 2022-03-09 DIAGNOSIS — E785 Hyperlipidemia, unspecified: Secondary | ICD-10-CM | POA: Insufficient documentation

## 2022-03-09 DIAGNOSIS — Z923 Personal history of irradiation: Secondary | ICD-10-CM | POA: Diagnosis not present

## 2022-03-09 DIAGNOSIS — E039 Hypothyroidism, unspecified: Secondary | ICD-10-CM | POA: Diagnosis not present

## 2022-03-09 DIAGNOSIS — L02818 Cutaneous abscess of other sites: Secondary | ICD-10-CM | POA: Diagnosis not present

## 2022-03-09 DIAGNOSIS — T63441D Toxic effect of venom of bees, accidental (unintentional), subsequent encounter: Secondary | ICD-10-CM

## 2022-03-09 NOTE — Progress Notes (Signed)
[name], [name] ([id number]) [id number] Page 1 of 7 Visit Report for 03/09/2022 Arrival Information Details Patient Name: Date of Service: [name], [name] 03/09/2022 8:30 A M Medical Record Number: [id number] Patient Account Number: [id number] Date of Birth/Sex: Treating RN: [birth year] (77 y.o. [name], [name] Primary Care [name]: [name] Other Clinician: Referring [name]: Treating [name]/Extender: [name] in Treatment: 1 Visit Information History Since Last Visit Added or deleted any medications: No Patient Arrived: Ambulatory Any new allergies or adverse reactions: No Arrival Time: 08:35 Had a fall or experienced change in No Accompanied By: husband activities of daily living that may affect Transfer Assistance: None risk of falls: Patient Requires Transmission-Based Precautions: No Signs or symptoms of abuse/neglect since last visito No Hospitalized since last visit: No Implantable device outside of the clinic excluding No cellular tissue based products placed in the center since last visit: Has Compression in Place as Prescribed: Yes Pain Present Now: No Electronic Signature(s) Signed: 03/09/2022 4:07:58 PM By: Misty East RN Entered By: [name] on 03/09/2022 08:36:32 -------------------------------------------------------------------------------- Encounter Discharge Information Details Patient Name: Date of Service: [name], [name] 03/09/2022 8:30 A M Medical Record Number: [id number] Patient Account Number: [id number] Date of Birth/Sex: Treating RN: 10-16-1945 (77 y.o. [name] Primary Care Dontrail Blackwell: [name] Other Clinician: Referring Jorge Amparo: Treating [name]: [name] in Treatment: 1 Encounter Discharge Information Items Post Procedure Vitals Discharge Condition: Stable Temperature (F): 98.0 Ambulatory Status: Ambulatory Pulse (bpm): 76 Discharge Destination:  Home Respiratory Rate (breaths/min): 16 Transportation: Private Auto Blood Pressure (mmHg): 163/80 Accompanied By: husband Schedule Follow-up Appointment: Yes Clinical Summary of Care: Electronic Signature(s) Signed: 03/09/2022 4:07:58 PM By: Misty East RN Entered By: [name] on 03/09/2022 09:32:12 [name] ([id number]) [id number] Page 2 of 7 -------------------------------------------------------------------------------- Lower Extremity Assessment Details Patient Name: Date of Service: [name], [name] 03/09/2022 8:30 A M Medical Record Number: [id number] Patient Account Number: [id number] Date of Birth/Sex: Treating RN: February 20, 1946 (77 y.o. [name] Primary Care Iridessa Harrow: [name] Other Clinician: Referring Bobbe Quilter: Treating Chandlar Staebell/Extender: [name] Weeks in Treatment: 1 Edema Assessment Assessed: [Left: No] [Right: No] [Left: Edema] [Right: :] Calf Left: Right: Point of Measurement: 33 cm From Medial Instep 30 cm Ankle Left: Right: Point of Measurement: 9 cm From Medial Instep 19.5 cm Vascular Assessment Pulses: Dorsalis Pedis Palpable: [Right:Yes] Electronic Signature(s) Signed: 03/09/2022 4:07:58 PM By: Misty East RN Entered By: [name] on 03/09/2022 08:43:20 -------------------------------------------------------------------------------- Multi Wound Chart Details Patient Name: Date of Service: [name], [name] 03/09/2022 8:30 A M Medical Record Number: [id number] Patient Account Number: [id number] Date of Birth/Sex: Treating RN: 1945-11-17 (77 y.o. F) Primary Care Summerlyn Fickel: [name] Other Clinician: Referring Jennessy Sandridge: Treating [name]/Extender: [name] in Treatment: 1 Vital Signs Height(in): 60 Pulse(bpm): 76 Weight(lbs): 126 Blood Pressure(mmHg): 163/80 Body Mass Index(BMI): 24.6 Temperature(F): 98.0 Respiratory Rate(breaths/min): 16 [1:Photos:]  [N/A:N/A] Right Achilles N/A N/A Wound Location: Gradually Appeared N/A N/A Wounding Event: Abscess N/A N/A Primary Etiology: Asthma, Hypertension, Received N/A N/A Comorbid History: Radiation 08/30/2021 N/A N/A Date Acquired: 1 N/A N/A Weeks of Treatment: Open N/A N/A Wound Status: No N/A N/A Wound Recurrence: 2.5x2.4x0.1 N/A N/A Measurements L x W x D (cm) 4.712 N/A N/A A (cm) : rea 0.471 N/A N/A Volume (cm) : 29.40% N/A N/A % Reduction in Area: 29.50% N/A N/A % Reduction in Volume: Full Thickness Without Exposed N/A N/A Classification: Support Structures Medium  N/A N/A Exudate Amount: Serosanguineous N/A N/A Exudate Type: red, brown N/A N/A Exudate Color: Small (1-33%) N/A N/A Granulation Amount: Red N/A N/A Granulation Quality: Large (67-100%) N/A N/A Necrotic Amount: Fat Layer (Subcutaneous Tissue): Yes N/A N/A Exposed Structures: Fascia: No Tendon: No Muscle: No Joint: No Bone: No Small (1-33%) N/A N/A Epithelialization: Scarring: Yes N/A N/A Periwound Skin Texture: No Abnormalities Noted N/A N/A Periwound Skin Moisture: No Abnormalities Noted N/A N/A Periwound Skin Color: No Abnormality N/A N/A Temperature: Biopsy N/A N/A Procedures Performed: Treatment Notes Wound #1 (Achilles) Wound Laterality: Right Cleanser Soap and Water Discharge Instruction: May shower and wash wound with dial antibacterial soap and water prior to dressing change. Wound Cleanser Discharge Instruction: Cleanse the wound with wound cleanser prior to applying a clean dressing using gauze sponges, not tissue or cotton balls. Peri-Wound Care Triamcinolone 15 (g) Discharge Instruction: Use triamcinolone 15 (g) as directed Sween Lotion (Moisturizing lotion) Discharge Instruction: Apply moisturizing lotion as directed Topical Primary Dressing Hydrofera Blue Ready Transfer Foam, 4x5 (in/in) Discharge Instruction: Apply to wound bed as instructed Secondary  Dressing Woven Gauze Sponge, Non-Sterile 4x4 in Discharge Instruction: Apply over primary dressing as directed. Zetuvit Plus 4x8 in Discharge Instruction: Apply over primary dressing as directed. Secured With Compression Wrap Kerlix Roll 4.5x3.1 (in/yd) Discharge Instruction: Apply Kerlix and Coban compression as directed. Coban Self-Adherent Wrap 4x5 (in/yd) Discharge Instruction: Apply over Kerlix as directed. Tubular Netting #5 Compression Stockings Add-Ons [name], [name] ([id number]) [id number] Page 4 of 7 Electronic Signature(s) Signed: 03/09/2022 9:47:58 AM By: Fredirick Maudlin MD FACS Entered By: Fredirick Maudlin on 03/09/2022 09:47:58 -------------------------------------------------------------------------------- Multi-Disciplinary Care Plan Details Patient Name: Date of Service: [name], [name] 03/09/2022 8:30 A M Medical Record Number: [id number] Patient Account Number: [id number] Date of Birth/Sex: Treating RN: 12-Jul-1945 (77 y.o. [name] Primary Care Jimmie Dattilio: [name] Other Clinician: Referring Yashika Mask: Treating Anjelina Dung/Extender: [name] in Treatment: 1 Active Inactive Wound/Skin Impairment Nursing Diagnoses: Knowledge deficit related to ulceration/compromised skin integrity Goals: Patient/caregiver will verbalize understanding of skin care regimen Date Initiated: 03/01/2022 Target Resolution Date: 05/04/2022 Goal Status: Active Interventions: Assess ulceration(s) every visit Treatment Activities: Skin care regimen initiated : 03/01/2022 Notes: Electronic Signature(s) Signed: 03/09/2022 4:07:58 PM By: Misty East RN Entered By: [name] on 03/09/2022 08:51:31 -------------------------------------------------------------------------------- Pain Assessment Details Patient Name: Date of Service: [name], SOLEIL MAS 03/09/2022 8:30 A M Medical Record Number: [id number] Patient Account Number:  [id number] Date of Birth/Sex: Treating RN: 11-Sep-1945 (77 y.o. [name] Primary Care Donye Dauenhauer: [name] Other Clinician: Referring Dajanee Voorheis: Treating Lilyauna Miedema/Extender: [name] in Treatment: 1 Active Problems Location of Pain Severity and Description of Pain Patient Has Paino No Site Locations Rate the pain. ZAVANNAH, DEBLOIS ([id number]) [id number] Page 5 of 7 Rate the pain. Current Pain Level: 0 Pain Management and Medication Current Pain Management: Electronic Signature(s) Signed: 03/09/2022 4:07:58 PM By: Misty East RN Entered By: [name] on 03/09/2022 08:39:21 -------------------------------------------------------------------------------- Patient/Caregiver Education Details Patient Name: Date of Service: [name] 1/4/2024andnbsp8:30 A M Medical Record Number: [id number] Patient Account Number: [id number] Date of Birth/Gender: Treating RN: 02-08-1946 (77 y.o. [name] Primary Care Physician: [name] Other Clinician: Referring Physician: Treating Physician/Extender: [name] in Treatment: 1 Education Assessment Education Provided To: Patient Education Topics Provided Wound Debridement: Methods: Explain/Verbal Responses: Reinforcements needed, State content correctly Wound/Skin Impairment: Methods: Explain/Verbal Responses: Reinforcements needed, State content correctly Electronic Signature(s) Signed: 03/09/2022 4:07:58  PM By: Misty East RN Entered By: [name] on 03/09/2022 08:51:51 -------------------------------------------------------------------------------- Wound Assessment Details Patient Name: Date of Service: [name], [name] 03/09/2022 8:30 A DANAYSHA, KIRN ([id number]) [id number] Page 6 of 7 Medical Record Number: [id number] Patient Account Number: [id number] Date of Birth/Sex: Treating RN: 1945-10-15 (77 y.o. [name],  [name] Primary Care Calyn Sivils: [name] Other Clinician: Referring Layliana Devins: Treating Tyshawn Keel/Extender: [name] Weeks in Treatment: 1 Wound Status Wound Number: 1 Primary Etiology: Abscess Wound Location: Right Achilles Wound Status: Open Wounding Event: Gradually Appeared Comorbid History: Asthma, Hypertension, Received Radiation Date Acquired: 08/30/2021 Weeks Of Treatment: 1 Clustered Wound: No Photos Wound Measurements Length: (cm) 2.5 Width: (cm) 2.4 Depth: (cm) 0.1 Area: (cm) 4.712 Volume: (cm) 0.471 % Reduction in Area: 29.4% % Reduction in Volume: 29.5% Epithelialization: Small (1-33%) Tunneling: No Undermining: No Wound Description Classification: Full Thickness Without Exposed Support Structures Exudate Amount: Medium Exudate Type: Serosanguineous Exudate Color: red, brown Foul Odor After Cleansing: No Slough/Fibrino Yes Wound Bed Granulation Amount: Small (1-33%) Exposed Structure Granulation Quality: Red Fascia Exposed: No Necrotic Amount: Large (67-100%) Fat Layer (Subcutaneous Tissue) Exposed: Yes Necrotic Quality: Adherent Slough Tendon Exposed: No Muscle Exposed: No Joint Exposed: No Bone Exposed: No Periwound Skin Texture Texture Color No Abnormalities Noted: Yes No Abnormalities Noted: Yes Moisture Temperature / Pain No Abnormalities Noted: Yes Temperature: No Abnormality Treatment Notes Wound #1 (Achilles) Wound Laterality: Right Cleanser Soap and Water Discharge Instruction: May shower and wash wound with dial antibacterial soap and water prior to dressing change. Wound Cleanser Discharge Instruction: Cleanse the wound with wound cleanser prior to applying a clean dressing using gauze sponges, not tissue or cotton balls. Peri-Wound Care Triamcinolone 15 (g) Discharge Instruction: Use triamcinolone 15 (g) as directed Sween Lotion (Moisturizing lotion) Discharge Instruction: Apply moisturizing lotion as  directed [name], [name] ([id number]) [id number] Page 7 of 7 Topical Primary Dressing Hydrofera Blue Ready Transfer Foam, 4x5 (in/in) Discharge Instruction: Apply to wound bed as instructed Secondary Dressing Woven Gauze Sponge, Non-Sterile 4x4 in Discharge Instruction: Apply over primary dressing as directed. Zetuvit Plus 4x8 in Discharge Instruction: Apply over primary dressing as directed. Secured With Compression Wrap Kerlix Roll 4.5x3.1 (in/yd) Discharge Instruction: Apply Kerlix and Coban compression as directed. Coban Self-Adherent Wrap 4x5 (in/yd) Discharge Instruction: Apply over Kerlix as directed. Tubular Netting #5 Compression Stockings Add-Ons Electronic Signature(s) Signed: 03/09/2022 4:07:58 PM By: Misty East RN Entered By: [name] on 03/09/2022 08:50:16 -------------------------------------------------------------------------------- Vitals Details Patient Name: Date of Service: [name], [name] 03/09/2022 8:30 A M Medical Record Number: [id number] Patient Account Number: [id number] Date of Birth/Sex: Treating RN: 05-21-1945 (77 y.o. [name], [name] Primary Care Margeaux Swantek: [name] Other Clinician: Referring Maxmilian Trostel: Treating Makari Portman/Extender: [name] in Treatment: 1 Vital Signs Time Taken: 08:34 Temperature (F): 98.0 Height (in): 60 Pulse (bpm): 76 Weight (lbs): 126 Respiratory Rate (breaths/min): 16 Body Mass Index (BMI): 24.6 Blood Pressure (mmHg): 163/80 Reference Range: 80 - 120 mg / dl Electronic Signature(s) Signed: 03/09/2022 4:07:58 PM By: Misty East RN Entered By: [name] on 03/09/2022 08:39:15

## 2022-03-09 NOTE — Progress Notes (Signed)
MALLOREY, ODONELL ([id number]) [id number] Page 1 of 8 Visit Report for 03/09/2022 Biopsy Details Patient Name: Date of Service: [name], [name] 03/09/2022 8:30 A M Medical Record Number: [id number] Patient Account Number: [id number] Date of Birth/Sex: Treating RN: 07/10/1945 (77 y.o. [name] Primary Care Provider: [name] Other Clinician: Referring Provider: Treating Provider/Extender: Otelia Sergeant Weeks in Treatment: 1 Biopsy Performed for: Wound #1 Right Achilles Location(s): Wound Bed Performed By: Physician Fredirick Maudlin, MD Tissue Punch: No Number of Specimens T aken: 1 Specimen Sent T Pathology: o Yes Level of Consciousness (Pre-procedure): Awake and Alert Pre-procedure Verification/Time-Out Taken: Yes - 09:10 Pain Control: Other Other Description: Xylocaine 1% Instrument: Blade, Forceps Bleeding: Moderate Hemostasis Achieved: Silver Nitrate Response to Treatment: Procedure was tolerated well Level of Consciousness (Post-procedure): Awake and Alert Post Procedure Diagnosis Same as Pre-procedure Notes Scribed for Dr. Celine Ahr by [name], RN Electronic Signature(s) Signed: 03/09/2022 9:35:39 AM By: [name] RN Signed: 03/09/2022 9:59:07 AM By: Fredirick Maudlin MD FACS Entered By: [name] on 03/09/2022 09:35:38 -------------------------------------------------------------------------------- Chief Complaint Document Details Patient Name: Date of Service: [name], [name] 03/09/2022 8:30 A M Medical Record Number: [id number] Patient Account Number: [id number] Date of Birth/Sex: Treating RN: 01/20/1946 (77 y.o. F) Primary Care Provider: [name] Other Clinician: Referring Provider: Treating Provider/Extender: [name] in Treatment: 1 Information Obtained from: Patient Chief Complaint Patient seen for complaints of Non-Healing Wound. Electronic Signature(s) Signed: 03/09/2022 9:48:04 AM By:  Fredirick Maudlin MD FACS Entered By: Fredirick Maudlin on 03/09/2022 09:48:04 [name] ([id number]) [id number] Page 2 of 8 -------------------------------------------------------------------------------- HPI Details Patient Name: Date of Service: [name], [name] 03/09/2022 8:30 A M Medical Record Number: [id number] Patient Account Number: [id number] Date of Birth/Sex: Treating RN: 1945/04/14 (77 y.o. F) Primary Care Provider: [name] Other Clinician: Referring Provider: Treating Provider/Extender: [name] in Treatment: 1 History of Present Illness HPI Description: ADMISSION 03/01/2022 This is a 77 year old otherwise fairly healthy woman (not diabetic, not obese, does not smoke) who presents to the clinic with an ulcer on her right posterior leg. She has a history of a liposarcoma excised in 1973. The patient is not sure but believes she received radiation therapy to the location. She did have a skin graft. About 6 months ago, she developed a small ulcer in the area that seemed to start as an abscess. She has had an open wound in that area since then. A shave biopsy was taken by a dermatologist that showed inflammation without evidence of malignancy. She has been applying Vaseline to the site along with periwound TCA. ABI in clinic today is 0.94. Due to the failure of the wound to heal properly, she has been referred to the wound care center for further evaluation and management. 03/09/2022: The tissue on the wound is protruding further from the skin surface. It does not have a typical appearance consistent with hypertrophic granulation tissue. I am concerned that it may represent malignancy. Electronic Signature(s) Signed: 03/09/2022 9:48:52 AM By: Fredirick Maudlin MD FACS Entered By: Fredirick Maudlin on 03/09/2022 09:48:52 -------------------------------------------------------------------------------- Physical Exam Details Patient  Name: Date of Service: [name], [name] 03/09/2022 8:30 A M Medical Record Number: [id number] Patient Account Number: [id number] Date of Birth/Sex: Treating RN: 06-10-1945 (77 y.o. F) Primary Care Provider: [name] Other Clinician: Referring Provider: Treating Provider/Extender: [name] in Treatment: 1 Constitutional She is hypertensive, but asymptomatic.. . . . No  acute distress. Respiratory Normal work of breathing on room air. Notes 03/09/2022: There is minimal slough on the wound surface today, but the tissue was protruding further from the surrounding skin surface. No concern for infection. Electronic Signature(s) Signed: 03/09/2022 9:49:42 AM By: Fredirick Maudlin MD FACS Entered By: Fredirick Maudlin on 03/09/2022 09:49:42 Physician Orders Details -------------------------------------------------------------------------------- [name] ([id number]) [id number] Page 3 of 8 Patient Name: Date of Service: [name], [name] 03/09/2022 8:30 A M Medical Record Number: [id number] Patient Account Number: [id number] Date of Birth/Sex: Treating RN: February 12, 1946 (77 y.o. [name] Primary Care Provider: [name] Other Clinician: Referring Provider: Treating Provider/Extender: [name] in Treatment: 1 Verbal / Phone Orders: No Diagnosis Coding ICD-10 Coding Code Description [phone] Non-pressure chronic ulcer of other part of right lower leg with fat layer exposed I10 Essential (primary) hypertension Z85.831 Personal history of malignant neoplasm of soft tissue Z92.3 Personal history of irradiation Follow-up Appointments ppointment in 1 week. - Dr. Celine Ahr Room 3 Return A Anesthetic (In clinic) Topical Lidocaine 4% applied to wound bed - Used in clinic Bathing/ Shower/ Hygiene May shower with protection but do not get wound dressing(s) wet. Protect dressing(s) with water repellant cover (for example, large  plastic bag) or a cast cover and may then take shower. - Please do not get leg wraps wet on Right Leg. May use a cast protector on right leg- can purchase from [employer]; Medical supply store; [facility] etc. the cast protector costs approximately $18-$29 Edema Control - Lymphedema / SCD / Other void standing for long periods of time. - Rest and elevate legs throughout the day. A Moisturize legs daily. - Use a lotion or moisturizer for example :Sween,Eucerin;Aquaphor etc. Wound Treatment Wound #1 - Achilles Wound Laterality: Right Cleanser: Soap and Water 1 x Per Week/30 Days Discharge Instructions: May shower and wash wound with dial antibacterial soap and water prior to dressing change. Cleanser: Wound Cleanser 1 x Per Week/30 Days Discharge Instructions: Cleanse the wound with wound cleanser prior to applying a clean dressing using gauze sponges, not tissue or cotton balls. Peri-Wound Care: Triamcinolone 15 (g) 1 x Per Week/30 Days Discharge Instructions: Use triamcinolone 15 (g) as directed Peri-Wound Care: Sween Lotion (Moisturizing lotion) 1 x Per Week/30 Days Discharge Instructions: Apply moisturizing lotion as directed Prim Dressing: Hydrofera Blue Ready Transfer Foam, 4x5 (in/in) 1 x Per Week/30 Days ary Discharge Instructions: Apply to wound bed as instructed Secondary Dressing: Woven Gauze Sponge, Non-Sterile 4x4 in 1 x Per Week/30 Days Discharge Instructions: Apply over primary dressing as directed. Secondary Dressing: Zetuvit Plus 4x8 in 1 x Per Week/30 Days Discharge Instructions: Apply over primary dressing as directed. Compression Wrap: Kerlix Roll 4.5x3.1 (in/yd) 1 x Per Week/30 Days Discharge Instructions: Apply Kerlix and Coban compression as directed. Compression Wrap: Coban Self-Adherent Wrap 4x5 (in/yd) 1 x Per Week/30 Days Discharge Instructions: Apply over Kerlix as directed. Compression Wrap: Tubular Netting #5 1 x Per Week/30 Days Laboratory Tissue Pathology biopsy  report (PATH) - Non-healing wound to Right posterior lower leg; rule out of possible squamous cell carcinoma. LOINC Code: 06237-6 Convenience Name: Tiss Path Bx report Electronic Signature(s) Signed: 03/09/2022 9:59:07 AM By: Fredirick Maudlin MD FACS Entered By: Fredirick Maudlin on 03/09/2022 09:50:30 [name] ([id number]) [id number] Page 4 of 8 -------------------------------------------------------------------------------- Problem List Details Patient Name: Date of Service: [name], [name] 03/09/2022 8:30 A M Medical Record Number: [id number] Patient Account Number: [id number] Date of Birth/Sex: Treating RN: [birth year] [phone]  y.o. [name] Primary Care Provider: [name] Other Clinician: Referring Provider: Treating Provider/Extender: Otelia Sergeant Weeks in Treatment: 1 Active Problems ICD-10 Encounter Code Description Active Date MDM Diagnosis [phone] Non-pressure chronic ulcer of other part of right lower leg with fat layer 03/01/2022 No Yes exposed Hodgeman (primary) hypertension 03/01/2022 No Yes Z85.831 Personal history of malignant neoplasm of soft tissue 03/01/2022 No Yes Z92.3 Personal history of irradiation 03/01/2022 No Yes Inactive Problems Resolved Problems Electronic Signature(s) Signed: 03/09/2022 9:47:52 AM By: Fredirick Maudlin MD FACS Entered By: Fredirick Maudlin on 03/09/2022 09:47:52 -------------------------------------------------------------------------------- Progress Note Details Patient Name: Date of Service: [name], [name] 03/09/2022 8:30 A M Medical Record Number: [id number] Patient Account Number: [id number] Date of Birth/Sex: Treating RN: 12-19-1945 (77 y.o. F) Primary Care Provider: [name] Other Clinician: Referring Provider: Treating Provider/Extender: [name] in Treatment: 1 Subjective Chief Complaint Information obtained from Patient Patient seen for complaints  of Non-Healing Wound. History of Present Illness (HPI) ADMISSION 03/01/2022 PATRINA, ANDREAS ([id number]) [id number] Page 5 of 8 This is a 77 year old otherwise fairly healthy woman (not diabetic, not obese, does not smoke) who presents to the clinic with an ulcer on her right posterior leg. She has a history of a liposarcoma excised in 1973. The patient is not sure but believes she received radiation therapy to the location. She did have a skin graft. About 6 months ago, she developed a small ulcer in the area that seemed to start as an abscess. She has had an open wound in that area since then. A shave biopsy was taken by a dermatologist that showed inflammation without evidence of malignancy. She has been applying Vaseline to the site along with periwound TCA. ABI in clinic today is 0.94. Due to the failure of the wound to heal properly, she has been referred to the wound care center for further evaluation and management. 03/09/2022: The tissue on the wound is protruding further from the skin surface. It does not have a typical appearance consistent with hypertrophic granulation tissue. I am concerned that it may represent malignancy. Patient History Information obtained from Patient. Family History Unknown History. Social History Never smoker, Marital Status - Married, Alcohol Use - Never, Drug Use - No History, Caffeine Use - Never. Medical History Respiratory Patient has history of Asthma - Childhood asthma Cardiovascular Patient has history of Hypertension Oncologic Patient has history of Received Radiation - R/T Liposarcoma in 1972 Hospitalization/Surgery History - Left Breast Lumpectomy;Tonsillectomy ; Squamous cell carcinoma; Hysterectomy;Right Leg liposarcoma. Medical A Surgical History Notes nd Eyes Pt. states she has pre-glaucoma Cardiovascular Hx: Angio-edema r/t Bee venom; Hyperlipidemia Endocrine Hx: hypothyroidism Immunological Hx: Currently  being treated with Immunotherapy (r/t Bee-Venom) Integumentary (Skin) Hx: Urticaria Musculoskeletal Hx: Osteoporosis Oncologic 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)-Biopsy taken to rule out squamous cell carcinoma on right arm and right leg (02/24/22), still pending results Objective Constitutional She is hypertensive, but asymptomatic.Marland Kitchen No acute distress. Vitals Time Taken: 8:34 AM, Height: 60 in, Weight: 126 lbs, BMI: 24.6, Temperature: 98.0 F, Pulse: 76 bpm, Respiratory Rate: 16 breaths/min, Blood Pressure: 163/80 mmHg. Respiratory Normal work of breathing on room air. General Notes: 03/09/2022: There is minimal slough on the wound surface today, but the tissue was protruding further from the surrounding skin surface. No concern for infection. Integumentary (Hair, Skin) Wound #1 status is Open. Original cause of wound was Gradually Appeared. The date acquired was: 08/30/2021. The wound has been in treatment  1 weeks. The wound is located on the Right Achilles. The wound measures 2.5cm length x 2.4cm width x 0.1cm depth; 4.712cm^2 area and 0.471cm^3 volume. There is Fat Layer (Subcutaneous Tissue) exposed. There is no tunneling or undermining noted. There is a medium amount of serosanguineous drainage noted. There is small (1-33%) red granulation within the wound bed. There is a large (67-100%) amount of necrotic tissue within the wound bed including Adherent Slough. The periwound skin appearance had no abnormalities noted for texture. The periwound skin appearance had no abnormalities noted for moisture. The periwound skin appearance had no abnormalities noted for color. Periwound temperature was noted as No Abnormality. Assessment Active Problems ICD-10 Non-pressure chronic ulcer of other part of right lower leg with fat layer exposed [name], [name] ([id number]) [id number] Page 6 of 8 Essential (primary) hypertension Personal  history of malignant neoplasm of soft tissue Personal history of irradiation Procedures Wound #1 Pre-procedure diagnosis of Wound #1 is an Abscess located on the Right Achilles . There was a biopsy performed by Fredirick Maudlin, MD. There was a biopsy performed on Wound Bed. The skin was cleansed and prepped with anti-septic followed by pain control using Other. Tissue was removed at its base with the following instrument(s): Blade and Forceps and sent to pathology. A Moderate amount of bleeding was controlled with Silver Nitrate. A time out was conducted at 09:10, prior to the start of the procedure. The procedure was tolerated well. Post procedure Diagnosis Wound #1: Same as Pre-Procedure General Notes: Scribed for Dr. Celine Ahr by [name], RN. Plan Follow-up Appointments: Return Appointment in 1 week. - Dr. Celine Ahr Room 3 Anesthetic: (In clinic) Topical Lidocaine 4% applied to wound bed - Used in clinic Bathing/ Shower/ Hygiene: May shower with protection but do not get wound dressing(s) wet. Protect dressing(s) with water repellant cover (for example, large plastic bag) or a cast cover and may then take shower. - Please do not get leg wraps wet on Right Leg. May use a cast protector on right leg- can purchase from [employer]; Medical supply store; [facility] etc. the cast protector costs approximately $18-$29 Edema Control - Lymphedema / SCD / Other: Avoid standing for long periods of time. - Rest and elevate legs throughout the day. Moisturize legs daily. - Use a lotion or moisturizer for example :Sween,Eucerin;Aquaphor etc. Laboratory ordered were: T[facility] Path Bx report - Non-healing wound to Right posterior lower leg; rule out of possible squamous cell carcinoma. WOUND #1: - Achilles Wound Laterality: Right Cleanser: Soap and Water 1 x Per Week/30 Days Discharge Instructions: May shower and wash wound with dial antibacterial soap and water prior to dressing change. Cleanser: Wound Cleanser 1 x  Per Week/30 Days Discharge Instructions: Cleanse the wound with wound cleanser prior to applying a clean dressing using gauze sponges, not tissue or cotton balls. Peri-Wound Care: Triamcinolone 15 (g) 1 x Per Week/30 Days Discharge Instructions: Use triamcinolone 15 (g) as directed Peri-Wound Care: Sween Lotion (Moisturizing lotion) 1 x Per Week/30 Days Discharge Instructions: Apply moisturizing lotion as directed Prim Dressing: Hydrofera Blue Ready Transfer Foam, 4x5 (in/in) 1 x Per Week/30 Days ary Discharge Instructions: Apply to wound bed as instructed Secondary Dressing: Woven Gauze Sponge, Non-Sterile 4x4 in 1 x Per Week/30 Days Discharge Instructions: Apply over primary dressing as directed. Secondary Dressing: Zetuvit Plus 4x8 in 1 x Per Week/30 Days Discharge Instructions: Apply over primary dressing as directed. Com pression Wrap: Kerlix Roll 4.5x3.1 (in/yd) 1 x Per Week/30 Days Discharge Instructions: Apply Kerlix and  Coban compression as directed. Com pression Wrap: Coban Self-Adherent Wrap 4x5 (in/yd) 1 x Per Week/30 Days Discharge Instructions: Apply over Kerlix as directed. Com pression Wrap: Tubular Netting #5 1 x Per Week/30 Days 03/09/2022: There is minimal slough on the wound surface today, but the tissue was protruding further from the surrounding skin surface. No concern for infection. Based on the appearance of the tissue today, I am concerned for malignancy. After discussing the risks with the patient and obtaining consent, I performed an incisional biopsy of the tissue on her leg. This was sent for pathology. I then chemically cauterized the entire wound surface with silver nitrate. We will use Hydrofera Blue under Kerlix and Coban wrap. Follow-up in 1 week. Electronic Signature(s) Signed: 03/09/2022 9:51:29 AM By: Fredirick Maudlin MD FACS Entered By: Fredirick Maudlin on 03/09/2022 09:51:28 HxROS  Details -------------------------------------------------------------------------------- [name] ([id number]) [id number] Page 7 of 8 Patient Name: Date of Service: [name], [name] 03/09/2022 8:30 A M Medical Record Number: [id number] Patient Account Number: [id number] Date of Birth/Sex: Treating RN: [birth year] (77 y.o. F) Primary Care Provider: [name] Other Clinician: Referring Provider: Treating Provider/Extender: [name] in Treatment: 1 Information Obtained From Patient Eyes Medical History: Past Medical History Notes: Pt. states she has pre-glaucoma Respiratory Medical History: Positive for: Asthma - Childhood asthma Cardiovascular Medical History: Positive for: Hypertension Past Medical History Notes: Hx: Angio-edema r/t Bee venom; Hyperlipidemia Endocrine Medical History: Past Medical History Notes: Hx: hypothyroidism Immunological Medical History: Past Medical History Notes: Hx: Currently being treated with Immunotherapy (r/t Bee-Venom) Integumentary (Skin) Medical History: Past Medical History Notes: Hx: Urticaria Musculoskeletal Medical History: Past Medical History Notes: Hx: Osteoporosis Oncologic Medical History: Positive for: Received Radiation - R/T Liposarcoma in 1972 Past Medical History Notes: 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)-Biopsy taken to rule out squamous cell carcinoma on right arm and right leg (02/24/22), still pending results Immunizations Pneumococcal Vaccine: Received Pneumococcal Vaccination: Yes Received Pneumococcal Vaccination On or After 60th Birthday: Yes Implantable Devices No devices added Hospitalization / Surgery History Type of Hospitalization/Surgery Left Breast Lumpectomy;Tonsillectomy ; Squamous cell carcinoma; Hysterectomy;Right Leg liposarcoma Family and Social History Unknown History: Yes; Never smoker; Marital Status -  Married; Alcohol Use: Never; Drug Use: No History; Caffeine Use: Never; Financial Concerns: No; Food, Clothing or Shelter Needs: No; Support System Lacking: No; Transportation Concerns: No [name], [name] ([id number]) [id number] Page 8 of 8 Electronic Signature(s) Signed: 03/09/2022 9:59:07 AM By: Fredirick Maudlin MD FACS Entered By: Fredirick Maudlin on 03/09/2022 09:48:57 -------------------------------------------------------------------------------- SuperBill Details Patient Name: Date of Service: [name], Kimimila [name]. 03/09/2022 Medical Record Number: [id number] Patient Account Number: [id number] Date of Birth/Sex: Treating RN: [birth year] (77 y.o. F) Primary Care Provider: [name] Other Clinician: Referring Provider: Treating Provider/Extender: Otelia Sergeant Weeks in Treatment: 1 Diagnosis Coding ICD-10 Codes Code Description [phone] Non-pressure chronic ulcer of other part of right lower leg with fat layer exposed I10 Essential (primary) hypertension Z85.831 Personal history of malignant neoplasm of soft tissue Z92.3 Personal history of irradiation Facility Procedures : CPT4 Code Description: [id number]-Incisional biopsy of skin (e.g., wedge) (including simple closure, when performed) single lesion ICD-10 Diagnosis Description L97.812 Non-pressure chronic ulcer of other part of right lower leg with fat layer exposed  Z85.831 Personal history of malignant neoplasm of soft tissue Modifier: Quantity: 1 Physician Procedures : CPT4 Code Description Modifier [id number] - WC PHYS LEVEL 4 - EST PT ICD-10 Diagnosis Description L97.812 Non-pressure chronic ulcer  of other part of right lower leg with fat layer exposed Z85.831 Personal history of malignant neoplasm of soft  tissue Z92.3 Personal history of irradiation I10 Essential (primary) hypertension Quantity: 1 : 11106 Incisional biopsy of skin (e.g., wedge) (including simple closure, when  performed) single lesion ICD-10 Diagnosis Description L97.812 Non-pressure chronic ulcer of other part of right lower leg with fat layer exposed Z85.831 Personal history of  malignant neoplasm of soft tissue Quantity: 1 Electronic Signature(s) Signed: 03/09/2022 9:52:03 AM By: Fredirick Maudlin MD FACS Entered By: Fredirick Maudlin on 03/09/2022 09:52:03

## 2022-03-13 ENCOUNTER — Encounter: Payer: Self-pay | Admitting: Internal Medicine

## 2022-03-13 ENCOUNTER — Other Ambulatory Visit: Payer: Self-pay

## 2022-03-13 ENCOUNTER — Ambulatory Visit: Payer: Medicare HMO | Admitting: Internal Medicine

## 2022-03-13 VITALS — HR 76 | Temp 98.2°F | Resp 20 | Wt 128.1 lb

## 2022-03-13 DIAGNOSIS — L2389 Allergic contact dermatitis due to other agents: Secondary | ICD-10-CM

## 2022-03-13 NOTE — Progress Notes (Signed)
    Follow-up Note  RE: [name] MRN: [id number] DOB: April 15, 1945 Date of Office Visit: 03/13/2022  Primary care provider: Colon Branch, MD Referring provider: Colon Branch, MD   [name] returns to the office today for the patch test placement, given suspected history of contact dermatitis.    Diagnostics: True Test patches placed.    Plan:   Allergic contact dermatitis - Instructions provided on care of the patches for the next 48 hours. [name] was instructed to avoid showering for the next 48 hours. [name] will follow up in 48 hours and 96 hours for patch readings.    Misty Marion MD [facility]

## 2022-03-15 ENCOUNTER — Ambulatory Visit: Payer: Medicare HMO | Admitting: Internal Medicine

## 2022-03-15 DIAGNOSIS — L2389 Allergic contact dermatitis due to other agents: Secondary | ICD-10-CM

## 2022-03-15 NOTE — Progress Notes (Signed)
   Follow Up Note  RE: MARISHKA RENTFROW MRN: [id number] DOB: 1945/07/04 Date of Office Visit: 03/15/2022  Referring provider: Colon Branch, MD Primary care provider: Colon Branch, MD  History of Present Illness: I had the pleasure of seeing Renesmee Raine for a follow up visit at the [facility] [place] [facility] [place] on 03/15/2022. She is a 77 y.o. female, who is being followed for allergic contact dermatitis . Today she is here for initial patch test interpretation, given suspected history of contact dermatitis.   Diagnostics:  TRUE TEST 48 hour reading:   T.R.U.E. Test - 03/15/22 1038       Test Information   Time Antigen Placed 0330    Manufacturer Other    Lot # [id number]    Location Back    Number of Test 36    Reading Interval Day 1;Day 3;Day 5    Panel Panel 1;Panel 2;Panel 3      Panel 1   1. Nickel Sulfate 0    2. Wool Alcohols 0    3. Neomycin Sulfate 0    4. Potassium Dichromate 0    5. Caine Mix 0    6. Fragrance Mix 0    7. Colophony 0    8. Paraben Mix 0    9. Negative Control 0    10. Balsam of [place] 0    11. Ethylenediamine Dihydrochloride 0    12. Cobalt Dichloride 0      Panel 2   13. p-tert Butylphenol Formaldehyde Resin 0    14. Epoxy Resin 0    15. Carba Mix 0    16.  Black Rubber Mix 0    17. Cl+ Me-Isothiazolinone 0    18. Quaternium-15 0    19. Methyldibromo Glutaronitrile 0    20. p-Phenylenediamine 0    21. Formaldehyde 0    22. Mercapto Mix 0    23. Thimerosal 1   +   24. Thiuram Mix 0      Panel 3   25. Diazolidinyl Urea 0    26. Quinoline Mix 0    27. Tixocortol-21-Pivalate 0    28. Gold Sodium Thiosulfate 0    29. Imidazolidinyl Urea 0    30. Budesonide 0    31. Hydrocortisone-17-Butyrate 0    32. Mercaptobenzothiazole 0    33. Bacitracin 0    34. Parthenolide 0    35. Disperse Blue 106 0    36. 2-Bromo-2-Nitropropane-1,3-diol 0              Assessment and Plan: Shontell is a 77 y.o. female with: Concern for Contact  Dermatitis: Positive to thimerosal  The patient has been provided detailed information regarding the substances she is sensitive to, as well as products containing the substances.  Meticulous avoidance of these substances is recommended. If avoidance is not possible, the use of barrier creams or lotions is recommended. If symptoms persist or progress despite meticulous avoidance of chemicals/substances above, dermatology evaluation may be warranted. No follow-ups on file.  It was my pleasure to see Summers today and participate in her care. Please feel free to contact me with any questions or concerns.  Sincerely,   Roney Marion, MD [facility]

## 2022-03-15 NOTE — Progress Notes (Signed)
   Follow Up Note  RE: [name] MRN: [id number] DOB: 11-Sep-1945 Date of Office Visit: 03/17/2022  Referring provider: Colon Branch, MD Primary care provider: Colon Branch, MD  History of Present Illness: I had the pleasure of seeing [name] for a follow up visit at the [facility] [place] [facility] [place] on 03/17/2022. She is a 77 y.o. female, who is being followed for venom allergy and rash. Today she is here for final patch test interpretation, given suspected history of contact dermatitis.   Diagnostics:   TRUE TEST 96 hour reading: + thimerosol and balsam of [place]  T.R.U.E. Test - 03/17/22 1115       Test Information   Time Antigen Placed 0330    Manufacturer Other    Lot # [id number]    Location Back    Number of Test 36    Reading Interval Day 1;Day 3;Day 5    Panel Panel 1;Panel 2;Panel 3      Panel 1   1. Nickel Sulfate 0    2. Wool Alcohols 0    3. Neomycin Sulfate 0    4. Potassium Dichromate 0    5. Caine Mix 0    6. Fragrance Mix 0    7. Colophony 0    8. Paraben Mix 0    9. Negative Control 0    10. [place] of [place] 1    11. Ethylenediamine Dihydrochloride 0    12. Cobalt Dichloride 0      Panel 2   13. p-tert Butylphenol Formaldehyde Resin 0    14. Epoxy Resin 0    15. Carba Mix 0    16.  Black Rubber Mix 0    17. Cl+ Me-Isothiazolinone 0    18. Quaternium-15 0    19. Methyldibromo Glutaronitrile 0    20. p-Phenylenediamine 0    21. Formaldehyde 0    22. Mercapto Mix 0    23. Thimerosal 2    24. Thiuram Mix 0      Panel 3   25. Diazolidinyl Urea 0    26. Quinoline Mix 0    27. Tixocortol-21-Pivalate 0    28. Gold Sodium Thiosulfate 0    29. Imidazolidinyl Urea 0    30. Budesonide 0    31. Hydrocortisone-17-Butyrate 0    32. Mercaptobenzothiazole 0    33. Bacitracin 0    34. Parthenolide 0    35. Disperse Blue 106 0    36. 2-Bromo-2-Nitropropane-1,3-diol 0              Assessment and Plan: [name] is a 77 y.o. female with: Concern for  Contact Dermatitis:  The patient has been provided detailed information regarding the substances she is sensitive to, as well as products containing the substances.  Meticulous avoidance of these substances is recommended. If avoidance is not possible, the use of barrier creams or lotions is recommended. If symptoms persist or progress despite meticulous avoidance of chemicals/substances above, dermatology evaluation may be warranted.  Follow-up yearly for venom allergy. Continue injections per protocol.  It was my pleasure to see Gae today and participate in her care. Please feel free to contact me with any questions or concerns.  Sincerely,   Sigurd Sos, MD [facility] [place]

## 2022-03-17 ENCOUNTER — Encounter (HOSPITAL_BASED_OUTPATIENT_CLINIC_OR_DEPARTMENT_OTHER): Payer: Medicare HMO | Admitting: General Surgery

## 2022-03-17 ENCOUNTER — Other Ambulatory Visit (HOSPITAL_COMMUNITY): Payer: Self-pay

## 2022-03-17 ENCOUNTER — Encounter: Payer: Self-pay | Admitting: Internal Medicine

## 2022-03-17 ENCOUNTER — Ambulatory Visit: Payer: Medicare HMO | Admitting: Internal Medicine

## 2022-03-17 DIAGNOSIS — L97812 Non-pressure chronic ulcer of other part of right lower leg with fat layer exposed: Secondary | ICD-10-CM | POA: Diagnosis not present

## 2022-03-17 DIAGNOSIS — Z923 Personal history of irradiation: Secondary | ICD-10-CM | POA: Diagnosis not present

## 2022-03-17 DIAGNOSIS — I10 Essential (primary) hypertension: Secondary | ICD-10-CM | POA: Diagnosis not present

## 2022-03-17 DIAGNOSIS — L02818 Cutaneous abscess of other sites: Secondary | ICD-10-CM | POA: Diagnosis not present

## 2022-03-17 DIAGNOSIS — L2389 Allergic contact dermatitis due to other agents: Secondary | ICD-10-CM

## 2022-03-17 DIAGNOSIS — E785 Hyperlipidemia, unspecified: Secondary | ICD-10-CM | POA: Diagnosis not present

## 2022-03-17 DIAGNOSIS — E039 Hypothyroidism, unspecified: Secondary | ICD-10-CM | POA: Diagnosis not present

## 2022-03-17 NOTE — Telephone Encounter (Signed)
Pharmacy Patient Advocate Encounter  Insurance verification completed.    The patient is insured through [employer]   Ran test claims for: Prolia 60mg.  Pharmacy benefit copay: $100.00  

## 2022-03-17 NOTE — Progress Notes (Signed)
Misty, [name] ([id number]) [id number] Page 1 of 7 Visit Report for 03/17/2022 Arrival Information Details Patient Name: Date of Service: [name], [name] 03/17/2022 7:45 A M Medical Record Number: [id number] Patient Account Number: [id number] Date of Birth/Sex: Treating RN: 02-Feb-1946 (77 y.o. [name] Primary Care Jonathen Rathman: [name] Other Clinician: Referring Keifer Habib: Treating Jaeden Westbay/Extender: [name] in Treatment: 2 Visit Information History Since Last Visit Added or deleted any medications: No Patient Arrived: Ambulatory Any new allergies or adverse reactions: No Arrival Time: 07:45 Had a fall or experienced change in No Accompanied By: husband activities of daily living that may affect Transfer Assistance: None risk of falls: Patient Identification Verified: Yes Signs or symptoms of abuse/neglect since last visito No Secondary Verification Process Completed: Yes Hospitalized since last visit: No Patient Requires Transmission-Based Precautions: No Implantable device outside of the clinic excluding No cellular tissue based products placed in the center since last visit: Has Dressing in Place as Prescribed: Yes Has Compression in Place as Prescribed: Yes Pain Present Now: Yes Electronic Signature(s) Signed: 03/17/2022 3:53:03 PM By: Adline Peals Entered By: Adline Peals on 03/17/2022 07:50:01 -------------------------------------------------------------------------------- Encounter Discharge Information Details Patient Name: Date of Service: MO [name], Iyah V. 03/17/2022 7:45 A M Medical Record Number: [id number] Patient Account Number: [id number] Date of Birth/Sex: Treating RN: [birth year] (77 y.o. [name] Primary Care Nikole Swartzentruber: [name] Other Clinician: Referring Sabryna Lahm: Treating Siya Flurry/Extender: [name] in Treatment: 2 Encounter Discharge Information Items  Post Procedure Vitals Discharge Condition: Stable Temperature (F): 97.7 Ambulatory Status: Ambulatory Pulse (bpm): 70 Discharge Destination: Home Respiratory Rate (breaths/min): 18 Transportation: Private Auto Blood Pressure (mmHg): 166/74 Accompanied By: husband Schedule Follow-up Appointment: Yes Clinical Summary of Care: Patient Declined Electronic Signature(s) Signed: 03/17/2022 3:53:03 PM By: Adline Peals Entered By: Adline Peals on 03/17/2022 08:24:02 [name] ([id number]) [id number] Page 2 of 7 -------------------------------------------------------------------------------- Lower Extremity Assessment Details Patient Name: Date of Service: Misty, [name] 03/17/2022 7:45 A M Medical Record Number: [id number] Patient Account Number: [id number] Date of Birth/Sex: Treating RN: October 12, 1945 (77 y.o. [name] Primary Care Shanai Lartigue: [name] Other Clinician: Referring Dace Denn: Treating Dyllin Gulley/Extender: Otelia Sergeant Weeks in Treatment: 2 Edema Assessment Assessed: [Left: No] [Right: No] [Left: Edema] [Right: :] Calf Left: Right: Point of Measurement: 33 cm From Medial Instep 31 cm Ankle Left: Right: Point of Measurement: 9 cm From Medial Instep 19.2 cm Vascular Assessment Pulses: Dorsalis Pedis Palpable: [Right:Yes] Electronic Signature(s) Signed: 03/17/2022 3:53:03 PM By: Adline Peals Entered By: Adline Peals on 03/17/2022 07:57:14 -------------------------------------------------------------------------------- Multi Wound Chart Details Patient Name: Date of Service: MO [name], [name] 03/17/2022 7:45 A M Medical Record Number: [id number] Patient Account Number: [id number] Date of Birth/Sex: Treating RN: June 01, 1945 (77 y.o. F) Primary Care Elany Felix: [name] Other Clinician: Referring Patrece Tallie: Treating Rondo Spittler/Extender: [name] in Treatment: 2 Vital  Signs Height(in): 60 Pulse(bpm): 70 Weight(lbs): 126 Blood Pressure(mmHg): 166/74 Body Mass Index(BMI): 24.6 Temperature(F): 97.7 Respiratory Rate(breaths/min): 18 [1:Photos:] [N/A:N/A] Right Achilles N/A N/A Wound Location: Gradually Appeared N/A N/A Wounding Event: Abscess N/A N/A Primary Etiology: Asthma, Hypertension, Received N/A N/A Comorbid History: Radiation 08/30/2021 N/A N/A Date Acquired: 2 N/A N/A Weeks of Treatment: Open N/A N/A Wound Status: No N/A N/A Wound Recurrence: 2.5x3.4x0.1 N/A N/A Measurements L x W x D (cm) 6.676 N/A N/A A (cm) : rea 0.668 N/A N/A Volume (cm) : 0.00% N/A N/A % Reduction in A rea: 0.00%  N/A N/A % Reduction in Volume: Full Thickness Without Exposed N/A N/A Classification: Support Structures Medium N/A N/A Exudate A mount: Serosanguineous N/A N/A Exudate Type: red, brown N/A N/A Exudate Color: Distinct, outline attached N/A N/A Wound Margin: Medium (34-66%) N/A N/A Granulation A mount: Red N/A N/A Granulation Quality: Medium (34-66%) N/A N/A Necrotic A mount: Fat Layer (Subcutaneous Tissue): Yes N/A N/A Exposed Structures: Fascia: No Tendon: No Muscle: No Joint: No Bone: No Small (1-33%) N/A N/A Epithelialization: Debridement - Excisional N/A N/A Debridement: Pre-procedure Verification/Time Out 08:05 N/A N/A Taken: Lidocaine 5% topical ointment N/A N/A Pain Control: Subcutaneous, Slough N/A N/A Tissue Debrided: Skin/Subcutaneous Tissue N/A N/A Level: 8.5 N/A N/A Debridement A (sq cm): rea Curette N/A N/A Instrument: Minimum N/A N/A Bleeding: Pressure N/A N/A Hemostasis A chieved: Procedure was tolerated well N/A N/A Debridement Treatment Response: 2.5x3.4x0.1 N/A N/A Post Debridement Measurements L x W x D (cm) 0.668 N/A N/A Post Debridement Volume: (cm) Scarring: Yes N/A N/A Periwound Skin Texture: No Abnormalities Noted N/A N/A Periwound Skin Moisture: Rubor: Yes N/A N/A Periwound  Skin Color: No Abnormality N/A N/A Temperature: Chemical Cauterization N/A N/A Procedures Performed: Debridement Treatment Notes Wound #1 (Achilles) Wound Laterality: Right Cleanser Soap and Water Discharge Instruction: May shower and wash wound with dial antibacterial soap and water prior to dressing change. Wound Cleanser Discharge Instruction: Cleanse the wound with wound cleanser prior to applying a clean dressing using gauze sponges, not tissue or cotton balls. Peri-Wound Care Triamcinolone 15 (g) Discharge Instruction: Use triamcinolone 15 (g) as directed Sween Lotion (Moisturizing lotion) Discharge Instruction: Apply moisturizing lotion as directed Topical Primary Dressing Hydrofera Blue Ready Transfer Foam, 4x5 (in/in) Discharge Instruction: Apply to wound bed as instructed Secondary Dressing Woven Gauze Sponge, Non-Sterile 4x4 in Discharge Instruction: Apply over primary dressing as directed. Zetuvit Plus 4x8 in Discharge Instruction: Apply over primary dressing as directed. CALI, HOPE ([id number]) [id number] Page 4 of 7 Secured With Compression Wrap Kerlix Roll 4.5x3.1 (in/yd) Discharge Instruction: Apply Kerlix and Coban compression as directed. Coban Self-Adherent Wrap 4x5 (in/yd) Discharge Instruction: Apply over Kerlix as directed. Tubular Netting #5 Compression Stockings Add-Ons Electronic Signature(s) Signed: 03/17/2022 8:11:45 AM By: Fredirick Maudlin MD FACS Entered By: Fredirick Maudlin on 03/17/2022 08:11:45 -------------------------------------------------------------------------------- Multi-Disciplinary Care Plan Details Patient Name: Date of Service: MO [name], Biana V. 03/17/2022 7:45 A M Medical Record Number: [id number] Patient Account Number: [id number] Date of Birth/Sex: Treating RN: 28-May-1945 (77 y.o. [name] Primary Care Perl Kerney: [name] Other Clinician: Referring Vee Bahe: Treating Tremaine Fuhriman/Extender:  [name] in Treatment: 2 Active Inactive Wound/Skin Impairment Nursing Diagnoses: Knowledge deficit related to ulceration/compromised skin integrity Goals: Patient/caregiver will verbalize understanding of skin care regimen Date Initiated: 03/01/2022 Target Resolution Date: 05/04/2022 Goal Status: Active Interventions: Assess ulceration(s) every visit Treatment Activities: Skin care regimen initiated : 03/01/2022 Notes: Electronic Signature(s) Signed: 03/17/2022 3:53:03 PM By: Adline Peals Entered By: Adline Peals on 03/17/2022 08:02:16 -------------------------------------------------------------------------------- Pain Assessment Details Patient Name: Date of Service: MO KAYSON, BULLIS 03/17/2022 7:45 A M Medical Record Number: [id number] Patient Account Number: [id number] Date of Birth/Sex: Treating RN: 1945/12/12 (77 y.o. [name] Primary Care Simeon Vera: [name] Other Clinician: DESTYN, PARFITT ([id number]) [id number] Page 5 of 7 Referring Lanah Steines: Treating Williamson Cavanah/Extender: Tonette Lederer, Ashley Murrain in Treatment: 2 Active Problems Location of Pain Severity and Description of Pain Patient Has Paino Yes Site Locations Pain Location: Pain in Ulcers Duration of the Pain. Constant / Intermittento Intermittent Rate the  pain. Current Pain Level: 1 Character of Pain Describe the Pain: Other: rubbing Pain Management and Medication Current Pain Management: Electronic Signature(s) Signed: 03/17/2022 3:53:03 PM By: Adline Peals Entered By: Adline Peals on 03/17/2022 07:50:38 -------------------------------------------------------------------------------- Patient/Caregiver Education Details Patient Name: Date of Service: MO Abbott Pao 1/12/2024andnbsp7:45 [place] Record Number: [id number] Patient Account Number: [id number] Date of Birth/Gender: Treating RN: [birth year] (77 y.o. [name] Primary Care Physician: [name] Other Clinician: Referring Physician: Treating Physician/Extender: [name] in Treatment: 2 Education Assessment Education Provided To: Patient Education Topics Provided Wound/Skin Impairment: Methods: Explain/Verbal Responses: Reinforcements needed, State content correctly Electronic Signature(s) Signed: 03/17/2022 3:53:03 PM By: Adline Peals Entered By: Adline Peals on 03/17/2022 08:02:27 CENDY, OCONNOR ([id number]) [id number] Page 6 of 7 -------------------------------------------------------------------------------- Wound Assessment Details Patient Name: Date of Service: DEVERY, ODWYER 03/17/2022 7:45 A M Medical Record Number: [id number] Patient Account Number: [id number] Date of Birth/Sex: Treating RN: [birth year] (77 y.o. [name] Primary Care Keyuna Cuthrell: [name] Other Clinician: Referring Benuel Ly: Treating Yula Crotwell/Extender: Otelia Sergeant Weeks in Treatment: 2 Wound Status Wound Number: 1 Primary Etiology: Abscess Wound Location: Right Achilles Wound Status: Open Wounding Event: Gradually Appeared Comorbid History: Asthma, Hypertension, Received Radiation Date Acquired: 08/30/2021 Weeks Of Treatment: 2 Clustered Wound: No Photos Wound Measurements Length: (cm) 2.5 Width: (cm) 3.4 Depth: (cm) 0.1 Area: (cm) 6.676 Volume: (cm) 0.668 % Reduction in Area: 0% % Reduction in Volume: 0% Epithelialization: Small (1-33%) Tunneling: No Undermining: No Wound Description Classification: Full Thickness Without Exposed Suppor Wound Margin: Distinct, outline attached Exudate Amount: Medium Exudate Type: Serosanguineous Exudate Color: red, brown t Structures Foul Odor After Cleansing: No Slough/Fibrino Yes Wound Bed Granulation Amount: Medium (34-66%) Exposed Structure Granulation Quality: Red Fascia Exposed: No Necrotic  Amount: Medium (34-66%) Fat Layer (Subcutaneous Tissue) Exposed: Yes Necrotic Quality: Adherent Slough Tendon Exposed: No Muscle Exposed: No Joint Exposed: No Bone Exposed: No Periwound Skin Texture Texture Color No Abnormalities Noted: Yes No Abnormalities Noted: No Rubor: Yes Moisture No Abnormalities Noted: Yes Temperature / Pain Temperature: No Abnormality Treatment Notes Wound #1 (Achilles) Wound Laterality: Right Cleanser Soap and [address] DIANELLY, FERRAN V ([id number]) [phone].pdf Page 7 of 7 Discharge Instruction: May shower and wash wound with dial antibacterial soap and water prior to dressing change. Wound Cleanser Discharge Instruction: Cleanse the wound with wound cleanser prior to applying a clean dressing using gauze sponges, not tissue or cotton balls. Peri-Wound Care Triamcinolone 15 (g) Discharge Instruction: Use triamcinolone 15 (g) as directed Sween Lotion (Moisturizing lotion) Discharge Instruction: Apply moisturizing lotion as directed Topical Primary Dressing Hydrofera Blue Ready Transfer Foam, 4x5 (in/in) Discharge Instruction: Apply to wound bed as instructed Secondary Dressing Woven Gauze Sponge, Non-Sterile 4x4 in Discharge Instruction: Apply over primary dressing as directed. Zetuvit Plus 4x8 in Discharge Instruction: Apply over primary dressing as directed. Secured With Compression Wrap Kerlix Roll 4.5x3.1 (in/yd) Discharge Instruction: Apply Kerlix and Coban compression as directed. Coban Self-Adherent Wrap 4x5 (in/yd) Discharge Instruction: Apply over Kerlix as directed. Tubular Netting #5 Compression Stockings Add-Ons Electronic Signature(s) Signed: 03/17/2022 3:53:03 PM By: Adline Peals Entered By: Adline Peals on 03/17/2022 07:59:58 -------------------------------------------------------------------------------- Vitals Details Patient Name: Date of Service: [name], Shyasia V. 03/17/2022 7:45 A M Medical  Record Number: [id number] Patient Account Number: [id number] Date of Birth/Sex: Treating RN: Jul 31, 1945 (77 y.o. [name] Primary Care Jr Milliron: [name] Other Clinician: Referring Naia Ruff: Treating Yazeed Pryer/Extender: Otelia Sergeant Weeks in Treatment: 2  Vital Signs Time Taken: 07:49 Temperature (F): 97.7 Height (in): 60 Pulse (bpm): 70 Weight (lbs): 126 Respiratory Rate (breaths/min): 18 Body Mass Index (BMI): 24.6 Blood Pressure (mmHg): 166/74 Reference Range: 80 - 120 mg / dl Electronic Signature(s) Signed: 03/17/2022 3:53:03 PM By: Adline Peals Entered By: Adline Peals on 03/17/2022 07:49:54

## 2022-03-17 NOTE — Telephone Encounter (Signed)
Pt ready for scheduling on or after 04/01/22  Out-of-pocket cost due at time of visit: $301   Primary: [employer] Prolia co-insurance: 20% (approximately $276) Admin fee co-insurance: 20% (approximately $25)   Secondary: n/a Prolia co-insurance:  Admin fee co-insurance:    Deductible: does not apply   Prior Auth:  PA#  Valid

## 2022-03-17 NOTE — Telephone Encounter (Signed)
Pharmacy benefit is cheaper for the patient. Send rx to [facility] 

## 2022-03-17 NOTE — Progress Notes (Signed)
Misty, [name] ([id number]) [id number] Page 1 of 9 Visit Report for 03/17/2022 Chief Complaint Document Details Patient Name: Date of Service: Misty, [name] 03/17/2022 7:45 A M Medical Record Number: [id number] Patient Account Number: [id number] Date of Birth/Sex: Treating RN: [birth year] (77 y.o. F) Primary Care Provider: [name] Other Clinician: Referring Provider: Treating Provider/Extender: [name] in Treatment: 2 Information Obtained from: Patient Chief Complaint Patient seen for complaints of Non-Healing Wound. Electronic Signature(s) Signed: 03/17/2022 8:11:54 AM By: Fredirick Maudlin MD FACS Entered By: Fredirick Maudlin on 03/17/2022 08:11:54 -------------------------------------------------------------------------------- Debridement Details Patient Name: Date of Service: [name], [name] 03/17/2022 7:45 A M Medical Record Number: [id number] Patient Account Number: [id number] Date of Birth/Sex: Treating RN: 08-Jan-1946 (77 y.o. [name] Primary Care Provider: [name] Other Clinician: Referring Provider: Treating Provider/Extender: [name] in Treatment: 2 Debridement Performed for Assessment: Wound #1 Right Achilles Performed By: Physician Fredirick Maudlin, MD Debridement Type: Debridement Level of Consciousness (Pre-procedure): Awake and Alert Pre-procedure Verification/Time Out Yes - 08:05 Taken: Start Time: 08:05 Pain Control: Lidocaine 5% topical ointment T Area Debrided (L x W): otal 2.5 (cm) x 3.4 (cm) = 8.5 (cm) Tissue and other material debrided: Non-Viable, Slough, Subcutaneous, Slough Level: Skin/Subcutaneous Tissue Debridement Description: Excisional Instrument: Curette Bleeding: Minimum Hemostasis Achieved: Pressure Response to Treatment: Procedure was tolerated well Level of Consciousness (Post- Awake and Alert procedure): Post Debridement Measurements of Total  Wound Length: (cm) 2.5 Width: (cm) 3.4 Depth: (cm) 0.1 Volume: (cm) 0.668 Character of Wound/Ulcer Post Debridement: Improved Post Procedure Diagnosis Same as Pre-procedure Misty, [name] V ([id number]) [phone].pdf Page 2 of 9 Notes scribed for Dr. Celine Ahr by Adline Peals, RN Electronic Signature(s) Signed: 03/17/2022 11:31:20 AM By: Fredirick Maudlin MD FACS Signed: 03/17/2022 3:53:03 PM By: Sabas Sous By: Adline Peals on 03/17/2022 08:07:41 -------------------------------------------------------------------------------- HPI Details Patient Name: Date of Service: Misty O RE, [name] 03/17/2022 7:45 A M Medical Record Number: [id number] Patient Account Number: [id number] Date of Birth/Sex: Treating RN: 1945/04/21 (77 y.o. F) Primary Care Provider: [name] Other Clinician: Referring Provider: Treating Provider/Extender: [name] in Treatment: 2 History of Present Illness HPI Description: ADMISSION 03/01/2022 This is a 77 year old otherwise fairly healthy woman (not diabetic, not obese, does not smoke) who presents to the clinic with an ulcer on her right posterior leg. She has a history of a liposarcoma excised in 1973. The patient is not sure but believes she received radiation therapy to the location. She did have a skin graft. About 6 months ago, she developed a small ulcer in the area that seemed to start as an abscess. She has had an open wound in that area since then. A shave biopsy was taken by a dermatologist that showed inflammation without evidence of malignancy. She has been applying Vaseline to the site along with periwound TCA. ABI in clinic today is 0.94. Due to the failure of the wound to heal properly, she has been referred to the wound care center for further evaluation and management. 03/09/2022: The tissue on the wound is protruding further from the skin surface. It does not have a typical appearance  consistent with hypertrophic granulation tissue. I am concerned that it may represent malignancy. 03/17/2022: Fortunately, the biopsy that I took last week was negative for malignancy. It returned as consistent with an ulcer and inflamed granulation tissue. She still has hypertrophic granulation tissue at the site and some slough accumulation on the surface. No  significant change in the wound dimensions. Electronic Signature(s) Signed: 03/17/2022 8:13:18 AM By: Fredirick Maudlin MD FACS Entered By: Fredirick Maudlin on 03/17/2022 08:13:18 -------------------------------------------------------------------------------- Chemical Cauterization Details Patient Name: Date of Service: Misty [name] RE, [name] 03/17/2022 7:45 A M Medical Record Number: [id number] Patient Account Number: [id number] Date of Birth/Sex: Treating RN: Jan 28, 1946 (77 y.o. [name] Primary Care Provider: [name] Other Clinician: Referring Provider: Treating Provider/Extender: [name] in Treatment: 2 Procedure Performed for: Wound #1 Right Achilles Performed By: Physician Fredirick Maudlin, MD Post Procedure Diagnosis Same as Pre-procedure Notes scribed for Dr. Celine Ahr by Adline Peals, RN Electronic Signature(s) Signed: 03/17/2022 11:31:20 AM By: Fredirick Maudlin MD FACS [place], [name] ([id number]) [id number] Page 3 of 9 Signed: 03/17/2022 3:53:03 PM By: Sabas Sous By: Adline Peals on 03/17/2022 08:08:40 -------------------------------------------------------------------------------- Physical Exam Details Patient Name: Date of Service: [name] 03/17/2022 7:45 A M Medical Record Number: [id number] Patient Account Number: [id number] Date of Birth/Sex: Treating RN: 29-Sep-1945 (77 y.o. F) Primary Care Provider: [name] Other Clinician: Referring Provider: Treating Provider/Extender: Otelia Sergeant Weeks in Treatment:  2 Constitutional Hypertensive, asymptomatic. . . . no acute distress. Respiratory Normal work of breathing on room air. Notes 03/17/2022: She still has hypertrophic granulation tissue at the site and some slough accumulation on the surface. No significant change in the wound dimensions. Electronic Signature(s) Signed: 03/17/2022 8:30:19 AM By: Fredirick Maudlin MD FACS Previous Signature: 03/17/2022 8:14:49 AM Version By: Fredirick Maudlin MD FACS Entered By: Fredirick Maudlin on 03/17/2022 08:30:19 -------------------------------------------------------------------------------- Physician Orders Details Patient Name: Date of Service: Misty [name] RE, [name] 03/17/2022 7:45 A M Medical Record Number: [id number] Patient Account Number: [id number] Date of Birth/Sex: Treating RN: [birth year] (77 y.o. [name] Primary Care Provider: [name] Other Clinician: Referring Provider: Treating Provider/Extender: [name] in Treatment: 2 Verbal / Phone Orders: No Diagnosis Coding ICD-10 Coding Code Description [phone] Non-pressure chronic ulcer of other part of right lower leg with fat layer exposed I10 Essential (primary) hypertension Z85.831 Personal history of malignant neoplasm of soft tissue Z92.3 Personal history of irradiation Follow-up Appointments ppointment in 1 week. - Dr. Celine Ahr Room 3 Return A Anesthetic (In clinic) Topical Lidocaine 5% applied to wound bed Bathing/ Shower/ Hygiene May shower with protection but do not get wound dressing(s) wet. Protect dressing(s) with water repellant cover (for example, large plastic bag) or a cast cover and may then take shower. - Please do not get leg wraps wet on Right Leg. May use a cast protector on right leg- can purchase from [employer]; Medical supply store; [facility] etc. the cast protector costs approximately $18-$29 Edema Control - Lymphedema / SCD / Other VIOLANDA, [name] ([id number])  [id number] Page 4 of 9 void standing for long periods of time. - Rest and elevate legs throughout the day. A Moisturize legs daily. - Use a lotion or moisturizer for example :Sween,Eucerin;Aquaphor etc. Wound Treatment Wound #1 - Achilles Wound Laterality: Right Cleanser: Soap and Water 1 x Per Week/30 Days Discharge Instructions: May shower and wash wound with dial antibacterial soap and water prior to dressing change. Cleanser: Wound Cleanser 1 x Per Week/30 Days Discharge Instructions: Cleanse the wound with wound cleanser prior to applying a clean dressing using gauze sponges, not tissue or cotton balls. Peri-Wound Care: Triamcinolone 15 (g) 1 x Per Week/30 Days Discharge Instructions: Use triamcinolone 15 (g) as directed Peri-Wound Care: Sween Lotion (Moisturizing lotion) 1 x Per Week/30 Days  Discharge Instructions: Apply moisturizing lotion as directed Prim Dressing: Hydrofera Blue Ready Transfer Foam, 4x5 (in/in) 1 x Per Week/30 Days ary Discharge Instructions: Apply to wound bed as instructed Secondary Dressing: Woven Gauze Sponge, Non-Sterile 4x4 in 1 x Per Week/30 Days Discharge Instructions: Apply over primary dressing as directed. Secondary Dressing: Zetuvit Plus 4x8 in 1 x Per Week/30 Days Discharge Instructions: Apply over primary dressing as directed. Compression Wrap: Kerlix Roll 4.5x3.1 (in/yd) 1 x Per Week/30 Days Discharge Instructions: Apply Kerlix and Coban compression as directed. Compression Wrap: Coban Self-Adherent Wrap 4x5 (in/yd) 1 x Per Week/30 Days Discharge Instructions: Apply over Kerlix as directed. Compression Wrap: Tubular Netting #5 1 x Per Week/30 Days Patient Medications llergies: bee venom protein (honey bee), moxifloxacin, promethazine HCl A Notifications Medication Indication Start End 03/17/2022 lidocaine DOSE topical 5 % ointment - ointment topical Electronic Signature(s) Signed: 03/17/2022 11:31:20 AM By: Fredirick Maudlin MD FACS Entered By: Fredirick Maudlin on 03/17/2022 08:30:33 -------------------------------------------------------------------------------- Problem List Details Patient Name: Date of Service: Misty O RE, [name] 03/17/2022 7:45 A M Medical Record Number: [id number] Patient Account Number: [id number] Date of Birth/Sex: Treating RN: Oct 31, 1945 (77 y.o. F) Primary Care Provider: [name] Other Clinician: Referring Provider: Treating Provider/Extender: Otelia Sergeant Weeks in Treatment: 2 Active Problems ICD-10 Encounter Code Description Active Date MDM Diagnosis [phone] Non-pressure chronic ulcer of other part of right lower leg with fat layer 03/01/2022 No Yes exposed KARLEN, BARBAR ([id number]) [phone].pdf Page 5 of 9 I10 Essential (primary) hypertension 03/01/2022 No Yes Z85.831 Personal history of malignant neoplasm of soft tissue 03/01/2022 No Yes Z92.3 Personal history of irradiation 03/01/2022 No Yes Inactive Problems Resolved Problems Electronic Signature(s) Signed: 03/17/2022 8:11:35 AM By: Fredirick Maudlin MD FACS Entered By: Fredirick Maudlin on 03/17/2022 08:11:35 -------------------------------------------------------------------------------- Progress Note Details Patient Name: Date of Service: [name], [name] 03/17/2022 7:45 A M Medical Record Number: [id number] Patient Account Number: [id number] Date of Birth/Sex: Treating RN: [birth year] (77 y.o. F) Primary Care Provider: [name] Other Clinician: Referring Provider: Treating Provider/Extender: [name] in Treatment: 2 Subjective Chief Complaint Information obtained from Patient Patient seen for complaints of Non-Healing Wound. History of Present Illness (HPI) ADMISSION 03/01/2022 This is a 77 year old otherwise fairly healthy woman (not diabetic, not obese, does not smoke) who presents to the clinic with an ulcer on her right posterior leg. She  has a history of a liposarcoma excised in 1973. The patient is not sure but believes she received radiation therapy to the location. She did have a skin graft. About 6 months ago, she developed a small ulcer in the area that seemed to start as an abscess. She has had an open wound in that area since then. A shave biopsy was taken by a dermatologist that showed inflammation without evidence of malignancy. She has been applying Vaseline to the site along with periwound TCA. ABI in clinic today is 0.94. Due to the failure of the wound to heal properly, she has been referred to the wound care center for further evaluation and management. 03/09/2022: The tissue on the wound is protruding further from the skin surface. It does not have a typical appearance consistent with hypertrophic granulation tissue. I am concerned that it may represent malignancy. 03/17/2022: Fortunately, the biopsy that I took last week was negative for malignancy. It returned as consistent with an ulcer and inflamed granulation tissue. She still has hypertrophic granulation tissue at the site and some slough accumulation on the surface. No  significant change in the wound dimensions. Patient History Information obtained from Patient. Family History Unknown History. Social History Never smoker, Marital Status - Married, Alcohol Use - Never, Drug Use - No History, Caffeine Use - Never. Medical History Respiratory Patient has history of Asthma - Childhood asthma Cardiovascular Patient has history of Hypertension Oncologic Patient has history of Received Radiation - R/T Liposarcoma in 1972 Hospitalization/Surgery History - Left Breast Lumpectomy;Tonsillectomy ; Squamous cell carcinoma; Hysterectomy;Right Leg liposarcoma. Medical A Surgical History Notes nd Misty, [name] ([id number]) [id number] Page 6 of 9 Eyes Pt. states she has pre-glaucoma Cardiovascular Hx: Angio-edema r/t Bee venom;  Hyperlipidemia Endocrine Hx: hypothyroidism Immunological Hx: Currently being treated with Immunotherapy (r/t Bee-Venom) Integumentary (Skin) Hx: Urticaria Musculoskeletal Hx: Osteoporosis Oncologic 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)-Biopsy taken to rule out squamous cell carcinoma on right arm and right leg (02/24/22), still pending results Objective Constitutional Hypertensive, asymptomatic. no acute distress. Vitals Time Taken: 7:49 AM, Height: 60 in, Weight: 126 lbs, BMI: 24.6, Temperature: 97.7 F, Pulse: 70 bpm, Respiratory Rate: 18 breaths/min, Blood Pressure: 166/74 mmHg. Respiratory Normal work of breathing on room air. General Notes: 03/17/2022: She still has hypertrophic granulation tissue at the site and some slough accumulation on the surface. No significant change in the wound dimensions. Integumentary (Hair, Skin) Wound #1 status is Open. Original cause of wound was Gradually Appeared. The date acquired was: 08/30/2021. The wound has been in treatment 2 weeks. The wound is located on the Right Achilles. The wound measures 2.5cm length x 3.4cm width x 0.1cm depth; 6.676cm^2 area and 0.668cm^3 volume. There is Fat Layer (Subcutaneous Tissue) exposed. There is no tunneling or undermining noted. There is a medium amount of serosanguineous drainage noted. The wound margin is distinct with the outline attached to the wound base. There is medium (34-66%) red granulation within the wound bed. There is a medium (34-66%) amount of necrotic tissue within the wound bed including Adherent Slough. The periwound skin appearance had no abnormalities noted for texture. The periwound skin appearance had no abnormalities noted for moisture. The periwound skin appearance exhibited: Rubor. Periwound temperature was noted as No Abnormality. Assessment Active Problems ICD-10 Non-pressure chronic ulcer of other part of right lower leg with fat layer  exposed Essential (primary) hypertension Personal history of malignant neoplasm of soft tissue Personal history of irradiation Procedures Wound #1 Pre-procedure diagnosis of Wound #1 is an Abscess located on the Right Achilles . There was a Excisional Skin/Subcutaneous Tissue Debridement with a total area of 8.5 sq cm performed by Fredirick Maudlin, MD. With the following instrument(s): Curette to remove Non-Viable tissue/material. Material removed includes Subcutaneous Tissue and Slough and after achieving pain control using Lidocaine 5% topical ointment. No specimens were taken. A time out was conducted at 08:05, prior to the start of the procedure. A Minimum amount of bleeding was controlled with Pressure. The procedure was tolerated well. Post Debridement Measurements: 2.5cm length x 3.4cm width x 0.1cm depth; 0.668cm^3 volume. Character of Wound/Ulcer Post Debridement is improved. Post procedure Diagnosis Wound #1: Same as Pre-Procedure General Notes: scribed for Dr. Celine Ahr by Adline Peals, RN. Pre-procedure diagnosis of Wound #1 is an Abscess located on the Right Achilles . An Chemical Cauterization procedure was performed by Fredirick Maudlin, MD. Post procedure Diagnosis Wound #1: Same as Pre-Procedure Notes: scribed for Dr. Celine Ahr by Adline Peals, RN [name], Misty V ([id number]) [phone].pdf Page 7 of 9 Plan Follow-up Appointments: Return Appointment in 1 week. - Dr. Celine Ahr Room 3 Anesthetic: (  In clinic) Topical Lidocaine 5% applied to wound bed Bathing/ Shower/ Hygiene: May shower with protection but do not get wound dressing(s) wet. Protect dressing(s) with water repellant cover (for example, large plastic bag) or a cast cover and may then take shower. - Please do not get leg wraps wet on Right Leg. May use a cast protector on right leg- can purchase from [employer]; Medical supply store; [facility] etc. the cast protector costs approximately $18-$29 Edema  Control - Lymphedema / SCD / Other: Avoid standing for long periods of time. - Rest and elevate legs throughout the day. Moisturize legs daily. - Use a lotion or moisturizer for example :Sween,Eucerin;Aquaphor etc. The following medication(s) was prescribed: lidocaine topical 5 % ointment ointment topical was prescribed at facility WOUND #1: - Achilles Wound Laterality: Right Cleanser: Soap and Water 1 x Per Week/30 Days Discharge Instructions: May shower and wash wound with dial antibacterial soap and water prior to dressing change. Cleanser: Wound Cleanser 1 x Per Week/30 Days Discharge Instructions: Cleanse the wound with wound cleanser prior to applying a clean dressing using gauze sponges, not tissue or cotton balls. Peri-Wound Care: Triamcinolone 15 (g) 1 x Per Week/30 Days Discharge Instructions: Use triamcinolone 15 (g) as directed Peri-Wound Care: Sween Lotion (Moisturizing lotion) 1 x Per Week/30 Days Discharge Instructions: Apply moisturizing lotion as directed Prim Dressing: Hydrofera Blue Ready Transfer Foam, 4x5 (in/in) 1 x Per Week/30 Days ary Discharge Instructions: Apply to wound bed as instructed Secondary Dressing: Woven Gauze Sponge, Non-Sterile 4x4 in 1 x Per Week/30 Days Discharge Instructions: Apply over primary dressing as directed. Secondary Dressing: Zetuvit Plus 4x8 in 1 x Per Week/30 Days Discharge Instructions: Apply over primary dressing as directed. Com pression Wrap: Kerlix Roll 4.5x3.1 (in/yd) 1 x Per Week/30 Days Discharge Instructions: Apply Kerlix and Coban compression as directed. Com pression Wrap: Coban Self-Adherent Wrap 4x5 (in/yd) 1 x Per Week/30 Days Discharge Instructions: Apply over Kerlix as directed. Com pression Wrap: Tubular Netting #5 1 x Per Week/30 Days 03/17/2022: She still has hypertrophic granulation tissue at the site and some slough accumulation on the surface. No significant change in the wound dimensions. I used a curette to debride  slough and nonviable subcutaneous tissue from the wound. I then chemically cauterize the hypertrophic granulation tissue with silver nitrate. We will continue to use Hydrofera Blue with Kerlix and Coban compression. Follow-up in 1 week. Electronic Signature(s) Signed: 03/17/2022 8:32:39 AM By: Fredirick Maudlin MD FACS Entered By: Fredirick Maudlin on 03/17/2022 08:32:39 -------------------------------------------------------------------------------- HxROS Details Patient Name: Date of Service: Misty O RE, [name] 03/17/2022 7:45 A M Medical Record Number: [id number] Patient Account Number: [id number] Date of Birth/Sex: Treating RN: 08/18/1945 (77 y.o. F) Primary Care Provider: [name] Other Clinician: Referring Provider: Treating Provider/Extender: [name] in Treatment: 2 Information Obtained From Patient Eyes Medical History: Past Medical History Notes: Pt. states she has pre-glaucoma Respiratory Medical History: Positive for: Asthma - Childhood asthma AMIT, MELOY ([id number]) [id number] Page 8 of 9 Cardiovascular Medical History: Positive for: Hypertension Past Medical History Notes: Hx: Angio-edema r/t Bee venom; Hyperlipidemia Endocrine Medical History: Past Medical History Notes: Hx: hypothyroidism Immunological Medical History: Past Medical History Notes: Hx: Currently being treated with Immunotherapy (r/t Bee-Venom) Integumentary (Skin) Medical History: Past Medical History Notes: Hx: Urticaria Musculoskeletal Medical History: Past Medical History Notes: Hx: Osteoporosis Oncologic Medical History: Positive for: Received Radiation - R/T Liposarcoma in 1972 Past Medical History Notes: 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)-Biopsy taken  to rule out squamous cell carcinoma on right arm and right leg (02/24/22), still pending results Immunizations Pneumococcal Vaccine: Received  Pneumococcal Vaccination: Yes Received Pneumococcal Vaccination On or After 60th Birthday: Yes Implantable Devices No devices added Hospitalization / Surgery History Type of Hospitalization/Surgery Left Breast Lumpectomy;Tonsillectomy ; Squamous cell carcinoma; Hysterectomy;Right Leg liposarcoma Family and Social History Unknown History: Yes; Never smoker; Marital Status - Married; Alcohol Use: Never; Drug Use: No History; Caffeine Use: Never; Financial Concerns: No; Food, Clothing or Shelter Needs: No; Support System Lacking: No; Transportation Concerns: No Electronic Signature(s) Signed: 03/17/2022 11:31:20 AM By: Fredirick Maudlin MD FACS Entered By: Fredirick Maudlin on 03/17/2022 08:13:27 -------------------------------------------------------------------------------- SuperBill Details Patient Name: Date of Service: Misty [name] RE, [name] 03/17/2022 Medical Record Number: [id number] Patient Account Number: [id number] Date of Birth/Sex: Treating RN: 1945-06-21 (77 y.o. F) Primary Care Provider: [name] Other Clinician: Referring Provider: Treating Provider/Extender: [name] in Treatment: 2 Misty, [name] ([id number]) [id number] Page 9 of 9 Diagnosis Coding ICD-10 Codes Code Description [id number] Non-pressure chronic ulcer of other part of right lower leg with fat layer exposed I10 Essential (primary) hypertension Z85.831 Personal history of malignant neoplasm of soft tissue Z92.3 Personal history of irradiation Facility Procedures : CPT4 Code: [id number] Description: 01007 - DEB SUBQ TISSUE 20 SQ CM/< ICD-10 Diagnosis Description L97.812 Non-pressure chronic ulcer of other part of right lower leg with fat layer exp Modifier: osed Quantity: 1 Physician Procedures : CPT4 Code Description Modifier [id number] - WC PHYS LEVEL 3 - EST PT 25 ICD-10 Diagnosis Description L97.812 Non-pressure chronic ulcer of other part of right lower leg with  fat layer exposed Z92.3 Personal history of irradiation Z85.831 Personal  history of malignant neoplasm of soft tissue I10 Essential (primary) hypertension Quantity: 1 : [id number] - WC PHYS SUBQ TISS 20 SQ CM ICD-10 Diagnosis Description M76.808 Non-pressure chronic ulcer of other part of right lower leg with fat layer exposed Quantity: 1 Electronic Signature(s) Signed: 03/17/2022 8:33:44 AM By: Fredirick Maudlin MD FACS Entered By: Fredirick Maudlin on 03/17/2022 08:33:44

## 2022-03-22 ENCOUNTER — Encounter: Payer: Self-pay | Admitting: Internal Medicine

## 2022-03-23 ENCOUNTER — Encounter (HOSPITAL_BASED_OUTPATIENT_CLINIC_OR_DEPARTMENT_OTHER): Payer: Medicare HMO | Admitting: General Surgery

## 2022-03-23 DIAGNOSIS — E785 Hyperlipidemia, unspecified: Secondary | ICD-10-CM | POA: Diagnosis not present

## 2022-03-23 DIAGNOSIS — I10 Essential (primary) hypertension: Secondary | ICD-10-CM | POA: Diagnosis not present

## 2022-03-23 DIAGNOSIS — L02415 Cutaneous abscess of right lower limb: Secondary | ICD-10-CM | POA: Diagnosis not present

## 2022-03-23 DIAGNOSIS — E039 Hypothyroidism, unspecified: Secondary | ICD-10-CM | POA: Diagnosis not present

## 2022-03-23 DIAGNOSIS — L97812 Non-pressure chronic ulcer of other part of right lower leg with fat layer exposed: Secondary | ICD-10-CM | POA: Diagnosis not present

## 2022-03-23 DIAGNOSIS — L02425 Furuncle of right lower limb: Secondary | ICD-10-CM | POA: Diagnosis not present

## 2022-03-23 DIAGNOSIS — Z923 Personal history of irradiation: Secondary | ICD-10-CM | POA: Diagnosis not present

## 2022-03-23 NOTE — Progress Notes (Signed)
ALIYA, SOL ([id number]) [id number] Page 1 of 8 Visit Report for 03/23/2022 Allergy List Details Patient Name: Date of Service: [name], [name] 03/23/2022 8:30 A M Medical Record Number: [id number] Patient Account Number: [id number] Date of Birth/Sex: Treating RN: 1945-04-03 (77 y.o. [name] Primary Care Kasey Hansell: [name] Other Clinician: Referring Lenice Koper: Treating Tyresse Jayson/Extender: Kathreen Cosier in Treatment: 3 Allergies Active Allergies bee venom protein (honey bee) Severity: Severe moxifloxacin Severity: Moderate promethazine HCl Severity: Moderate balsam [place] Reaction: rash Severity: Moderate thimerosal Reaction: rash Severity: Moderate Allergy Notes Electronic Signature(s) Signed: 03/23/2022 4:47:13 PM By: [name] RN Entered By: [name] on 03/23/2022 09:22:40 -------------------------------------------------------------------------------- Arrival Information Details Patient Name: Date of Service: [name], [name] 03/23/2022 8:30 A M Medical Record Number: [id number] Patient Account Number: [id number] Date of Birth/Sex: Treating RN: 1945-10-26 (77 y.o. [name], [name] Primary Care Vijay Durflinger: [name] Other Clinician: Referring Gabriele Zwilling: Treating Trenisha Lafavor/Extender: Kathreen Cosier in Treatment: 3 Visit Information Patient Arrived: Ambulatory Arrival Time: 08:31 Accompanied By: self Transfer Assistance: None Patient Identification Verified: Yes Patient Requires Transmission-Based Precautions: No TYRELL, BRERETON ([id number]) History Since Last Visit Added or deleted any medications: No Any new allergies or adverse reactions: Yes Had a fall or experienced change in activities of daily living that may affect risk of falls: No Signs or symptoms of abuse/neglect since last visito No Hospitalized since last visit: No Implantable device outside of the clinic excluding cellular tissue  based products placed in the center since last visit: No Has Compression in Place as Prescribed: Yes Pain Present Now: Yes Electronic Signature(s) Signed: 03/23/2022 4:47:13 PM By: [name] RN Entered By: [name] on 03/23/2022 08:32:42 -------------------------------------------------------------------------------- Encounter Discharge Information Details Patient Name: Date of Service: [name], [name] 03/23/2022 8:30 A M Medical Record Number: [id number] Patient Account Number: [id number] Date of Birth/Sex: Treating RN: [birth year] (77 y.o. [name] Primary Care Nahom Carfagno: [name] Other Clinician: Referring Jakell Trusty: Treating Tippi Mccrae/Extender: Kathreen Cosier in Treatment: 3 Encounter Discharge Information Items Post Procedure Vitals Discharge Condition: Stable Temperature (F): 97.9 Ambulatory Status: Ambulatory Pulse (bpm): 76 Discharge Destination: Home Respiratory Rate (breaths/min): 16 Transportation: Private Auto Blood Pressure (mmHg): 169/73 Accompanied By: self Schedule Follow-up Appointment: Yes Clinical Summary of Care: Electronic Signature(s) Signed: 03/23/2022 4:47:13 PM By: [name] RN Entered By: [name] on 03/23/2022 08:58:20 -------------------------------------------------------------------------------- Lower Extremity Assessment Details Patient Name: Date of Service: [name], [name] 03/23/2022 8:30 A M Medical Record Number: [id number] Patient Account Number: [id number] Date of Birth/Sex: Treating RN: Jul 10, 1945 (77 y.o. [name] Primary Care Christella App: [name] Other Clinician: Referring Misk Galentine: Treating Savan Ruta/Extender: Kathreen Cosier in Treatment: 3 Edema Assessment Assessed: [Left: No] [Right: No] [Left: Edema] [Right: :] Calf Left: Right: Point of Measurement: 33 cm From Medial Instep 30.8 cm Ankle Left: Right: Point of Measurement: 9 cm From Medial Instep 19  cm Vascular Assessment Pulses: Dorsalis Pedis Palpable: [Right:Yes [id number] Page 3 of 8] Electronic Signature(s) Signed: 03/23/2022 4:47:13 PM By: [name] RN Entered By: [name] on 03/23/2022 08:41:23 -------------------------------------------------------------------------------- Multi Wound Chart Details Patient Name: Date of Service: [name], [name] 03/23/2022 8:30 A M Medical Record Number: [id number] Patient Account Number: [id number] Date of Birth/Sex: Treating RN: May 21, 1945 (77 y.o.) F) Primary Care Abena Erdman: [name] Other Clinician: Referring Danna Casella: Treating Rainen Vanrossum/Extender: Kathreen Cosier in Treatment: 3 Vital Signs Height(in): 60 Pulse(bpm): 76 Weight(lbs): 126  Blood Pressure(mmHg): 169/73 Body Mass Index(BMI): 24.6 Temperature(F): 97.9 Respiratory Rate(breaths/min): 16 [1:Photos: No Photos Right Achilles Wound Location: Gradually Appeared Wounding Event: Abscess Primary Etiology: Asthma, Hypertension, Received Comorbid History: Radiation 08/30/2021 Date Acquired: 3 Weeks of Treatment: Open Wound Status: No Wound Recurrence:  2.2x3x0.1 Measurements L x W x D (cm) 5.184 A (cm) : rea 0.518 Volume (cm) : 22.30% % Reduction in A rea: 22.50% % Reduction in Volume: Full Thickness Without Exposed Classification: Support Structures Medium Exudate A mount: Serosanguineous Exudate  Type: red, brown Exudate Color: Distinct, outline attached Wound Margin: Medium (34-66%) Granulation A mount: Red Granulation Quality: Medium (34-66%) Necrotic A mount: Fat Layer (Subcutaneous Tissue): Yes N/A Exposed Structures: Fascia: No Tendon: No  Muscle: No Joint: No Bone: No Small (1-33%) Epithelialization: Debridement - Selective/Open Wound N/A Debridement: Pre-procedure Verification/Time Out 08:55 Taken: Lidocaine 5% topical ointment Pain Control: Slough Tissue Debrided: Non-Viable Tissue  Level: 6.6 Debridement A (sq cm): rea  Curette Instrument: Minimum Bleeding: Silver Nitrate Hemostasis A chieved: Procedure was tolerated well Debridement Treatment Response: 2.2x3x0.1 Post Debridement Measurements L x W x D (cm) 0.518 Post Debridement  Volume: (cm) Scarring: Yes Periwound Skin Texture: No Abnormalities Noted Periwound Skin Moisture: Rubor: Yes Periwound Skin Color: No Abnormality Temperature: Debridement Procedures Performed:] [N/A:N/A N/A N/A N/A N/A N/A N/A N/A N/A N/A N/A N/A N/A  N/A N/A N/A N/A N/A N/A N/A N/A N/A N/A N/A N/A N/A N/A N/A N/A N/A N/A N/A N/A N/A N/A N/A N/A N/A N/A] Treatment Notes Wound #1 (Achilles) Wound Laterality: Right Cleanser Soap and Water Discharge Instruction: May shower and wash wound with dial antibacterial soap and water prior to dressing change. Wound Cleanser Discharge Instruction: Cleanse the wound with wound cleanser prior to applying a clean dressing using gauze sponges, not tissue or cotton balls. Peri-Wound Care Triamcinolone 15 (g) Discharge Instruction: Use triamcinolone 15 (g) as directed Sween Lotion (Moisturizing lotion) Discharge Instruction: Apply moisturizing lotion as directed Topical Primary Dressing Hydrofera Blue Ready Transfer Foam, 4x5 (in/in) Discharge Instruction: Apply to wound bed as instructed Secondary Dressing Woven Gauze Sponge, Non-Sterile 4x4 in Discharge Instruction: Apply over primary dressing as directed. Zetuvit Plus 4x8 in Discharge Instruction: Apply over primary dressing as directed. Secured With Compression Wrap Kerlix Roll 4.5x3.1 (in/yd) Discharge Instruction: Apply Kerlix and Coban compression as directed. Coban Self-Adherent Wrap 4x5 (in/yd) Discharge Instruction: Apply over Kerlix as directed. Tubular Netting #5 Compression Stockings Add-Ons Electronic Signature(s) Signed: 03/23/2022 9:22:21 AM By: Fredirick Maudlin MD FACS Entered By: Fredirick Maudlin on 03/23/2022  09:22:20 -------------------------------------------------------------------------------- Multi-Disciplinary Care Plan Details Patient Name: Date of Service: [name], [name] 03/23/2022 8:30 A M Medical Record Number: [id number] Patient Account Number: [id number] Date of Birth/Sex: Treating RN: 05-26-1945 (77 y.o. [name] Primary Care Collins Kerby: [name] Other Clinician: Referring Loriann Bosserman: Treating Tradarius Reinwald/Extender: Kathreen Cosier in Treatment: 3 Active Inactive Wound/Skin Impairment Nursing Diagnoses: Knowledge deficit related to ulceration/compromised skin integrity Goals: PAULETT, KAUFHOLD ([id number]) [id number] Page 5 of 8 Patient/caregiver will verbalize understanding of skin care regimen Date Initiated: 03/01/2022 Target Resolution Date: 05/04/2022 Goal Status: Active Interventions: Assess ulceration(s) every visit Treatment Activities: Skin care regimen initiated : 03/01/2022 Notes: Electronic Signature(s) Signed: 03/23/2022 4:47:13 PM By: [name] RN Entered By: [name] on 03/23/2022 08:55:37 -------------------------------------------------------------------------------- Pain Assessment Details Patient Name: Date of Service: [name], [name] 03/23/2022 8:30 A M Medical Record Number: [id number] Patient Account Number: [id number] Date of Birth/Sex: Treating RN: 1945-08-28 (77 y.o. F) Zochol,  Roselyn Reef Primary Care Nazier Neyhart: [name] Other Clinician: Referring Kayshaun Polanco: Treating Dierra Riesgo/Extender: Kathreen Cosier in Treatment: 3 Active Problems Location of Pain Severity and Description of Pain Patient Has Paino Yes Site Locations Rate the pain. Current Pain Level: 2 Pain Management and Medication Current Pain Management: Electronic Signature(s) Signed: 03/23/2022 4:47:13 PM By: [name] RN Entered By: [name] on 03/23/2022  08:36:04 -------------------------------------------------------------------------------- Patient/Caregiver Education Details Patient Name: Date of Service: [name], [name] 1/18/2024andnbsp8:30 A M Balis, Julianna V ([id number]) [id number] Page 6 of 8 Medical Record Number: [id number] Patient Account Number: [id number] Date of Birth/Gender: Treating RN: 1945-08-31 (77 y.o. [name] Primary Care Physician: [name] Other Clinician: Referring Physician: Treating Physician/Extender: Kathreen Cosier in Treatment: 3 Education Assessment Education Provided To: Patient Education Topics Provided Wound/Skin Impairment: Methods: Explain/Verbal Responses: Reinforcements needed, State content correctly Electronic Signature(s) Signed: 03/23/2022 4:47:13 PM By: [name] RN Entered By: [name] on 03/23/2022 08:57:27 -------------------------------------------------------------------------------- Wound Assessment Details Patient Name: Date of Service: [name], [name] 03/23/2022 8:30 A M Medical Record Number: [id number] Patient Account Number: [id number] Date of Birth/Sex: Treating RN: 02/27/1946 (77 y.o. [name], [name] Primary Care Nitish Roes: [name] Other Clinician: Referring Rehanna Oloughlin: Treating Jhada Risk/Extender: Kathreen Cosier in Treatment: 3 Wound Status Wound Number: 1 Primary Etiology: Abscess Wound Location: Right Achilles Wound Status: Open Wounding Event: Gradually Appeared Comorbid History: Asthma, Hypertension, Received Radiation Date Acquired: 08/30/2021 Weeks Of Treatment: 3 Clustered Wound: No Wound Measurements Length: (cm) 2.2 Width: (cm) 3 Depth: (cm) 0.1 Area: (cm) 5.184 Volume: (cm) 0.518 % Reduction in Area: 22.3% % Reduction in Volume: 22.5% Epithelialization: Small (1-33%) Tunneling: No Undermining: No Wound Description Classification: Full Thickness Without Exposed  Suppor Wound Margin: Distinct, outline attached Exudate Amount: Medium Exudate Type: Serosanguineous Exudate Color: red, brown t Structures Foul Odor After Cleansing: No Slough/Fibrino Yes Wound Bed Granulation Amount: Medium (34-66%) Exposed Structure Granulation Quality: Red Fascia Exposed: No Necrotic Amount: Medium (34-66%) Fat Layer (Subcutaneous Tissue) Exposed: Yes Tendon Exposed: No Muscle Exposed: No Joint Exposed: No Bone Exposed: No Periwound Skin Texture Texture Color No Abnormalities Noted: Yes No Abnormalities Noted: No [name], [name] ([id number]) [id number] Page 7 of 8 Rubor: Yes Moisture No Abnormalities Noted: Yes Temperature / Pain Temperature: No Abnormality Treatment Notes Wound #1 (Achilles) Wound Laterality: Right Cleanser Soap and Water Discharge Instruction: May shower and wash wound with dial antibacterial soap and water prior to dressing change. Wound Cleanser Discharge Instruction: Cleanse the wound with wound cleanser prior to applying a clean dressing using gauze sponges, not tissue or cotton balls. Peri-Wound Care Triamcinolone 15 (g) Discharge Instruction: Use triamcinolone 15 (g) as directed Sween Lotion (Moisturizing lotion) Discharge Instruction: Apply moisturizing lotion as directed Topical Primary Dressing Hydrofera Blue Ready Transfer Foam, 4x5 (in/in) Discharge Instruction: Apply to wound bed as instructed Secondary Dressing Woven Gauze Sponge, Non-Sterile 4x4 in Discharge Instruction: Apply over primary dressing as directed. Zetuvit Plus 4x8 in Discharge Instruction: Apply over primary dressing as directed. Secured With Compression Wrap Kerlix Roll 4.5x3.1 (in/yd) Discharge Instruction: Apply Kerlix and Coban compression as directed. Coban Self-Adherent Wrap 4x5 (in/yd) Discharge Instruction: Apply over Kerlix as directed. Tubular Netting #5 Compression Stockings Add-Ons Electronic  Signature(s) Signed: 03/23/2022 4:47:13 PM By: [name] RN Entered By: [name] on 03/23/2022 08:54:42 -------------------------------------------------------------------------------- Vitals Details Patient Name: Date of Service: [name], [name] 03/23/2022 8:30 A M Medical Record Number: [id number] Patient Account Number: [id number] Date of  Birth/Sex: Treating RN: May 08, 1945 (77 y.o. [name], [name] Primary Care Reed Dady: [name] Other Clinician: Referring Stefani Baik: Treating Katrell Milhorn/Extender: Kathreen Cosier in Treatment: 3 Vital Signs Time Taken: 08:32 Temperature (F): 97.9 Height (in): 60 Pulse (bpm): 76 Weight (lbs): 126 Respiratory Rate (breaths/min): 16 Body Mass Index (BMI): 24.6 Blood Pressure (mmHg): 169/73 Reference Range: 80 - 120 mg / dl SHANTAE, VANTOL ([id number]) [id number] Page 8 of 8 Electronic Signature(s) Signed: 03/23/2022 4:47:13 PM By: [name] RN Entered By: [name] on 03/23/2022 08:35:55

## 2022-03-23 NOTE — Progress Notes (Signed)
[name], [name] ([id number]) [id number] Page 1 of 9 Visit Report for 03/23/2022 Chief Complaint Document Details Patient Name: Date of Service: [name], [name] 03/23/2022 8:30 A M Medical Record Number: [id number] Patient Account Number: [id number] Date of Birth/Sex: Treating RN: [birth year] (77 y.o. F) Primary Care Provider: [name] Other Clinician: Referring Provider: Treating Provider/Extender: Kathreen Cosier in Treatment: 3 Information Obtained from: Patient Chief Complaint Patient seen for complaints of Non-Healing Wound. Electronic Signature(s) Signed: 03/23/2022 9:22:27 AM By: Fredirick Maudlin MD FACS Entered By: Fredirick Maudlin on 03/23/2022 09:22:26 -------------------------------------------------------------------------------- Debridement Details Patient Name: Date of Service: [name], [name] 03/23/2022 8:30 A M Medical Record Number: [id number] Patient Account Number: [id number] Date of Birth/Sex: Treating RN: [birth year] (77 y.o. [name] Primary Care Provider: [name] Other Clinician: Referring Provider: Treating Provider/Extender: Kathreen Cosier in Treatment: 3 Debridement Performed for Assessment: Wound #1 Right Achilles Performed By: Physician Fredirick Maudlin, MD Debridement Type: Debridement Level of Consciousness (Pre-procedure): Awake and Alert Pre-procedure Verification/Time Out Yes - 08:55 Taken: Start Time: 08:56 Pain Control: Lidocaine 5% topical ointment T Area Debrided (L x W): otal 2.2 (cm) x 3 (cm) = 6.6 (cm) Tissue and other material debrided: Non-Viable, Slough, Slough Level: Non-Viable Tissue Debridement Description: Selective/Open Wound Instrument: Curette Bleeding: Minimum Hemostasis Achieved: Silver Nitrate Response to Treatment: Procedure was tolerated well Level of Consciousness (Post- Awake and Alert procedure): Post Debridement Measurements of Total  Wound Length: (cm) 2.2 Width: (cm) 3 Depth: (cm) 0.1 Volume: (cm) 0.518 Character of Wound/Ulcer Post Debridement: Requires Further Debridement Post Procedure Diagnosis Same as Pre-procedure [name], [name] ([id number]) [id number] Page 2 of 9 Notes Scribed for Dr. Celine Ahr by [name], RN Electronic Signature(s) Signed: 03/23/2022 10:47:40 AM By: Fredirick Maudlin MD FACS Signed: 03/23/2022 4:47:13 PM By: [name] RN Entered By: [name] on 03/23/2022 08:57:10 -------------------------------------------------------------------------------- HPI Details Patient Name: Date of Service: [name] 03/23/2022 8:30 A M Medical Record Number: [id number] Patient Account Number: [id number] Date of Birth/Sex: Treating RN: 10-28-1945 (77 y.o. F) Primary Care Provider: [name] Other Clinician: Referring Provider: Treating Provider/Extender: Kathreen Cosier in Treatment: 3 History of Present Illness HPI Description: ADMISSION 03/01/2022 This is a 77 year old otherwise fairly healthy woman (not diabetic, not obese, does not smoke) who presents to the clinic with an ulcer on her right posterior leg. She has a history of a liposarcoma excised in 1973. The patient is not sure but believes she received radiation therapy to the location. She did have a skin graft. About 6 months ago, she developed a small ulcer in the area that seemed to start as an abscess. She has had an open wound in that area since then. A shave biopsy was taken by a dermatologist that showed inflammation without evidence of malignancy. She has been applying Vaseline to the site along with periwound TCA. ABI in clinic today is 0.94. Due to the failure of the wound to heal properly, she has been referred to the wound care center for further evaluation and management. 03/09/2022: The tissue on the wound is protruding further from the skin surface. It does not have a typical  appearance consistent with hypertrophic granulation tissue. I am concerned that it may represent malignancy. 03/17/2022: Fortunately, the biopsy that I took last week was negative for malignancy. It returned as consistent with an ulcer and inflamed granulation tissue. She still has hypertrophic granulation tissue at the site and some slough accumulation  on the surface. No significant change in the wound dimensions. 03/23/2022: The wound measured smaller today and the hypertrophic granulation tissue is less prominent. Due to the unusual shape of her leg from old surgery, however, there has been some friction from her compressive wrap. Electronic Signature(s) Signed: 03/23/2022 9:23:25 AM By: Fredirick Maudlin MD FACS Entered By: Fredirick Maudlin on 03/23/2022 09:23:25 -------------------------------------------------------------------------------- Physical Exam Details Patient Name: Date of Service: [name], [name] 03/23/2022 8:30 A M Medical Record Number: [id number] Patient Account Number: [id number] Date of Birth/Sex: Treating RN: [birth year] (77 y.o. F) Primary Care Provider: [name] Other Clinician: Referring Provider: Treating Provider/Extender: Kathreen Cosier in Treatment: 3 Constitutional Hypertensive, asymptomatic. . . . no acute distress. Respiratory Normal work of breathing on room air. Notes 03/23/2022: The wound measured smaller today and the hypertrophic granulation tissue is less prominent. Due to the unusual shape of her leg from old surgery, however, there has been some friction from her compressive wrap. [name], [name] ([id number]) [id number] Page 3 of 9 Electronic Signature(s) Signed: 03/23/2022 9:24:35 AM By: Fredirick Maudlin MD FACS Entered By: Fredirick Maudlin on 03/23/2022 09:24:35 -------------------------------------------------------------------------------- Physician Orders Details Patient Name: Date of Service: [name], [name] 03/23/2022 8:30 A M Medical Record Number: [id number] Patient Account Number: [id number] Date of Birth/Sex: Treating RN: 1945-12-07 (77 y.o. [name] Primary Care Provider: [name] Other Clinician: Referring Provider: Treating Provider/Extender: Kathreen Cosier in Treatment: 3 Verbal / Phone Orders: No Diagnosis Coding ICD-10 Coding Code Description [phone] Non-pressure chronic ulcer of other part of right lower leg with fat layer exposed I10 Essential (primary) hypertension Z85.831 Personal history of malignant neoplasm of soft tissue Z92.3 Personal history of irradiation Follow-up Appointments ppointment in 1 week. - Dr. Celine Ahr Room 3 Return A Anesthetic (In clinic) Topical Lidocaine 5% applied to wound bed Bathing/ Shower/ Hygiene May shower with protection but do not get wound dressing(s) wet. Protect dressing(s) with water repellant cover (for example, large plastic bag) or a cast cover and may then take shower. - Please do not get leg wraps wet on Right Leg. May use a cast protector on right leg- can purchase from [employer]; Medical supply store; [facility] etc. the cast protector costs approximately $18-$29 Edema Control - Lymphedema / SCD / Other void standing for long periods of time. - Rest and elevate legs throughout the day. A Moisturize legs daily. - Use a lotion or moisturizer for example :Sween,Eucerin;Aquaphor etc. Wound Treatment Wound #1 - Achilles Wound Laterality: Right Cleanser: Soap and Water 1 x Per Week/30 Days Discharge Instructions: May shower and wash wound with dial antibacterial soap and water prior to dressing change. Cleanser: Wound Cleanser 1 x Per Week/30 Days Discharge Instructions: Cleanse the wound with wound cleanser prior to applying a clean dressing using gauze sponges, not tissue or cotton balls. Peri-Wound Care: Triamcinolone 15 (g) 1 x Per Week/30 Days Discharge Instructions: Use triamcinolone 15 (g) as  directed Peri-Wound Care: Sween Lotion (Moisturizing lotion) 1 x Per Week/30 Days Discharge Instructions: Apply moisturizing lotion as directed Prim Dressing: Hydrofera Blue Ready Transfer Foam, 4x5 (in/in) 1 x Per Week/30 Days ary Discharge Instructions: Apply to wound bed as instructed Secondary Dressing: Woven Gauze Sponge, Non-Sterile 4x4 in 1 x Per Week/30 Days Discharge Instructions: Apply over primary dressing as directed. Secondary Dressing: Zetuvit Plus 4x8 in 1 x Per Week/30 Days Discharge Instructions: Apply over primary dressing as directed. Compression Wrap: Kerlix Roll 4.5x3.1 (in/yd) 1 x Per  Week/30 Days Discharge Instructions: Apply Kerlix and Coban compression as directed. Compression Wrap: Coban Self-Adherent Wrap 4x5 (in/yd) 1 x Per Week/30 Days Discharge Instructions: Apply over Kerlix as directed. [name], [name] ([id number]) [id number] Page 4 of 9 Compression Wrap: Tubular Netting #5 1 x Per Week/30 Days Electronic Signature(s) Signed: 03/23/2022 10:47:40 AM By: Fredirick Maudlin MD FACS Entered By: Fredirick Maudlin on 03/23/2022 09:25:11 -------------------------------------------------------------------------------- Problem List Details Patient Name: Date of Service: [name], [name] 03/23/2022 8:30 A M Medical Record Number: [id number] Patient Account Number: [id number] Date of Birth/Sex: Treating RN: [birth year] (77 y.o. [name] Primary Care Provider: [name] Other Clinician: Referring Provider: Treating Provider/Extender: Kathreen Cosier in Treatment: 3 Active Problems ICD-10 Encounter Code Description Active Date MDM Diagnosis [phone] Non-pressure chronic ulcer of other part of right lower leg with fat layer 03/01/2022 No Yes exposed [place] (primary) hypertension 03/01/2022 No Yes Z85.831 Personal history of malignant neoplasm of soft tissue 03/01/2022 No Yes Z92.3 Personal history of  irradiation 03/01/2022 No Yes Inactive Problems Resolved Problems Electronic Signature(s) Signed: 03/23/2022 9:22:12 AM By: Fredirick Maudlin MD FACS Entered By: Fredirick Maudlin on 03/23/2022 09:22:12 -------------------------------------------------------------------------------- Progress Note Details Patient Name: Date of Service: [name], [name] 03/23/2022 8:30 A M Medical Record Number: [id number] Patient Account Number: [id number] Date of Birth/Sex: Treating RN: [birth year] (77 y.o. F) Primary Care Provider: [name] Other Clinician: Referring Provider: Treating Provider/Extender: Kathreen Cosier in Treatment: [address] ULANI, DEGRASSE ([id number]) [id number] Page 5 of 9 Chief Complaint Information obtained from Patient Patient seen for complaints of Non-Healing Wound. History of Present Illness (HPI) ADMISSION 03/01/2022 This is a 77 year old otherwise fairly healthy woman (not diabetic, not obese, does not smoke) who presents to the clinic with an ulcer on her right posterior leg. She has a history of a liposarcoma excised in 1973. The patient is not sure but believes she received radiation therapy to the location. She did have a skin graft. About 6 months ago, she developed a small ulcer in the area that seemed to start as an abscess. She has had an open wound in that area since then. A shave biopsy was taken by a dermatologist that showed inflammation without evidence of malignancy. She has been applying Vaseline to the site along with periwound TCA. ABI in clinic today is 0.94. Due to the failure of the wound to heal properly, she has been referred to the wound care center for further evaluation and management. 03/09/2022: The tissue on the wound is protruding further from the skin surface. It does not have a typical appearance consistent with hypertrophic granulation tissue. I am concerned that it may represent  malignancy. 03/17/2022: Fortunately, the biopsy that I took last week was negative for malignancy. It returned as consistent with an ulcer and inflamed granulation tissue. She still has hypertrophic granulation tissue at the site and some slough accumulation on the surface. No significant change in the wound dimensions. 03/23/2022: The wound measured smaller today and the hypertrophic granulation tissue is less prominent. Due to the unusual shape of her leg from old surgery, however, there has been some friction from her compressive wrap. Patient History Information obtained from Patient. Allergies bee venom protein (honey bee) (Severity: Severe), moxifloxacin (Severity: Moderate), promethazine HCl (Severity: Moderate), balsam [place] (Severity: Moderate, Reaction: rash), thimerosal (Severity: Moderate, Reaction: rash) Family History Unknown History. Social History Never smoker, Marital Status - Married, Alcohol Use - Never, Drug Use - No History, Caffeine  Use - Never. Medical History Respiratory Patient has history of Asthma - Childhood asthma Cardiovascular Patient has history of Hypertension Oncologic Patient has history of Received Radiation - R/T Liposarcoma in 1972 Hospitalization/Surgery History - Left Breast Lumpectomy;Tonsillectomy ; Squamous cell carcinoma; Hysterectomy;Right Leg liposarcoma. 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 no acute distress. Vitals Time Taken:  8:32 AM, Height: 60 in, Weight: 126 lbs, BMI: 24.6, Temperature: 97.9 F, Pulse: 76 bpm, Respiratory Rate: 16 breaths/min, Blood Pressure: 169/73 mmHg. Respiratory Normal work of breathing on room air. ARINA, TORRY ([id number]) [id number] Page 6 of 9 General Notes: 03/23/2022: The wound measured smaller today and the hypertrophic granulation tissue is less prominent. Due to the unusual shape of her leg from old surgery, however, there has been some friction from her compressive wrap. Integumentary (Hair, Skin) Wound #1 status is Open. Original cause of wound was Gradually Appeared. The date acquired was: 08/30/2021. The wound has been in treatment 3 weeks. The wound is located on the Right Achilles. The wound measures 2.2cm length x 3cm width x 0.1cm depth; 5.184cm^2 area and 0.518cm^3 volume. There is Fat Layer (Subcutaneous Tissue) exposed. There is no tunneling or undermining noted. There is a medium amount of serosanguineous drainage noted. The wound margin is distinct with the outline attached to the wound base. There is medium (34-66%) red granulation within the wound bed. There is a medium (34-66%) amount of necrotic tissue within the wound bed. The periwound skin appearance had no abnormalities noted for texture. The periwound skin appearance had no abnormalities noted for moisture. The periwound skin appearance exhibited: Rubor. Periwound temperature was noted as No Abnormality. Assessment Active Problems ICD-10 Non-pressure chronic ulcer of other part of right lower leg with fat layer exposed Essential (primary) hypertension Personal history of malignant neoplasm of soft tissue Personal history of irradiation Procedures Wound #1 Pre-procedure diagnosis of Wound #1 is an Abscess located on the Right Achilles . There was a Selective/Open Wound Non-Viable Tissue Debridement with a total area of 6.6 sq cm performed by Fredirick Maudlin, MD. With the following  instrument(s): Curette to remove Non-Viable tissue/material. Material removed includes [facility] after achieving pain control using Lidocaine 5% topical ointment. No specimens were taken. A time out was conducted at 08:55, prior to the start of the procedure. A Minimum amount of bleeding was controlled with Silver Nitrate. The procedure was tolerated well. Post Debridement Measurements: 2.2cm length x 3cm width x 0.1cm depth; 0.518cm^3 volume. Character of Wound/Ulcer Post Debridement requires further debridement. Post procedure Diagnosis Wound #1: Same as Pre-Procedure General Notes: Scribed for Dr. Celine Ahr by [name], RN. Plan Follow-up Appointments: Return Appointment in 1 week. - Dr. Celine Ahr Room 3 Anesthetic: (In clinic) Topical Lidocaine 5% applied to wound bed Bathing/ Shower/ Hygiene: May shower with protection but do not get wound dressing(s) wet. Protect dressing(s) with water repellant cover (for example, large plastic bag) or a cast cover and may then take shower. - Please do not get leg wraps wet on Right Leg. May use a cast protector on right leg- can purchase from [employer]; Medical supply store; [facility] etc. the cast protector costs approximately $18-$29  Edema Control - Lymphedema / SCD / Other: Avoid standing for long periods of time. - Rest and elevate legs throughout the day. Moisturize legs daily. - Use a lotion or moisturizer for example :Sween,Eucerin;Aquaphor etc. WOUND #1: - Achilles Wound Laterality: Right Cleanser: Soap and Water 1 x Per Week/30 Days Discharge Instructions: May shower and wash wound with dial antibacterial soap and water prior to dressing change. Cleanser: Wound Cleanser 1 x Per Week/30 Days Discharge Instructions: Cleanse the wound with wound cleanser prior to applying a clean dressing using gauze sponges, not tissue or cotton balls. Peri-Wound Care: Triamcinolone 15 (g) 1 x Per Week/30 Days Discharge Instructions: Use triamcinolone 15 (g) as  directed Peri-Wound Care: Sween Lotion (Moisturizing lotion) 1 x Per Week/30 Days Discharge Instructions: Apply moisturizing lotion as directed Prim Dressing: Hydrofera Blue Ready Transfer Foam, 4x5 (in/in) 1 x Per Week/30 Days ary Discharge Instructions: Apply to wound bed as instructed Secondary Dressing: Woven Gauze Sponge, Non-Sterile 4x4 in 1 x Per Week/30 Days Discharge Instructions: Apply over primary dressing as directed. Secondary Dressing: Zetuvit Plus 4x8 in 1 x Per Week/30 Days Discharge Instructions: Apply over primary dressing as directed. Com pression Wrap: Kerlix Roll 4.5x3.1 (in/yd) 1 x Per Week/30 Days Discharge Instructions: Apply Kerlix and Coban compression as directed. Com pression Wrap: Coban Self-Adherent Wrap 4x5 (in/yd) 1 x Per Week/30 Days Discharge Instructions: Apply over Kerlix as directed. Com pression Wrap: Tubular Netting #5 1 x Per Week/30 Days 03/23/2022: The wound measured smaller today and the hypertrophic granulation tissue is less prominent. Due to the unusual shape of her leg from old surgery, however, there has been some friction from her compressive wrap. I used a curette to debride some slough from the wound and then chemically cauterized the hypertrophic granulation tissue again using silver nitrate. We will continue Hydrofera Blue to the wound with Kerlix and Coban compression. We will make some adjustments in our padding placement to accommodate for the [name], DENHERDER ([id number]) [phone].pdf Page 7 of 9 large divot in the back of her leg from her liposarcoma resection. Follow-up in 1 week. Electronic Signature(s) Signed: 03/23/2022 9:26:04 AM By: Fredirick Maudlin MD FACS Entered By: Fredirick Maudlin on 03/23/2022 09:26:04 -------------------------------------------------------------------------------- HxROS Details Patient Name: Date of Service: [place], Glenora [name]. 03/23/2022 8:30 A M Medical Record Number:  [id number] Patient Account Number: [id number] Date of Birth/Sex: Treating RN: 01/14/1946 (77 y.o. F) Primary Care Provider: [name] Other Clinician: Referring Provider: Treating Provider/Extender: Kathreen Cosier in Treatment: 3 Information Obtained From Patient Eyes Medical History: Past Medical History Notes: Pt. states she has pre-glaucoma Respiratory Medical History: Positive for: Asthma - Childhood asthma Cardiovascular Medical History: Positive for: Hypertension Past Medical History Notes: Hx: Angio-edema r/t Bee venom; Hyperlipidemia Endocrine Medical History: Past Medical History Notes: Hx: hypothyroidism Immunological Medical History: Past Medical History Notes: Hx: Currently being treated with Immunotherapy (r/t Bee-Venom) Integumentary (Skin) Medical History: Past Medical History Notes: Hx: Urticaria Musculoskeletal Medical History: Past Medical History Notes: Hx: Osteoporosis Oncologic Medical History: Positive for: Received Radiation - R/T Liposarcoma in 1972 Past Medical History Notes: 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)-Biopsy taken to rule out squamous cell carcinoma on right arm and right leg (02/24/22), still pending results Immunizations DARCEE, DEKKER ([id number]) [id number] Page 8 of 9 Pneumococcal Vaccine: Received Pneumococcal Vaccination: Yes Received Pneumococcal Vaccination On or After 60th Birthday: Yes Implantable Devices No devices added Hospitalization / Surgery History Type of Hospitalization/Surgery Left Breast Lumpectomy;Tonsillectomy ; Squamous  cell carcinoma; Hysterectomy;Right Leg liposarcoma Family and Social History Unknown History: Yes; Never smoker; Marital Status - Married; Alcohol Use: Never; Drug Use: No History; Caffeine Use: Never; Financial Concerns: No; Food, Clothing or Shelter Needs: No; Support System Lacking: No; Transportation  Concerns: No Electronic Signature(s) Signed: 03/23/2022 10:47:40 AM By: Fredirick Maudlin MD FACS Entered By: Fredirick Maudlin on 03/23/2022 09:24:09 -------------------------------------------------------------------------------- SuperBill Details Patient Name: Date of Service: [name], Laykin [name]. 03/23/2022 Medical Record Number: [id number] Patient Account Number: [id number] Date of Birth/Sex: Treating RN: Mar 03, 1946 (77 y.o. F) Primary Care Provider: [name] Other Clinician: Referring Provider: Treating Provider/Extender: Kathreen Cosier in Treatment: 3 Diagnosis Coding ICD-10 Codes Code Description [phone] Non-pressure chronic ulcer of other part of right lower leg with fat layer exposed I10 Essential (primary) hypertension Z85.831 Personal history of malignant neoplasm of soft tissue Z92.3 Personal history of irradiation Facility Procedures : CPT4 Code: [id number] Description: [phone] - DEBRIDE WOUND 1ST 20 SQ CM OR < ICD-10 Diagnosis Description L97.812 Non-pressure chronic ulcer of other part of right lower leg with fat layer expos Modifier: ed Quantity: 1 Physician Procedures : CPT4 Code Description Modifier [id number] - WC PHYS LEVEL 3 - EST PT 25 ICD-10 Diagnosis Description L97.812 Non-pressure chronic ulcer of other part of right lower leg with fat layer exposed Z85.831 Personal history of malignant neoplasm of soft  tissue Z92.3 Personal history of irradiation I10 Essential (primary) hypertension Quantity: 1 : [id number] - WC PHYS DEBR WO ANESTH 20 SQ CM ICD-10 Diagnosis Description I50.277 Non-pressure chronic ulcer of other part of right lower leg with fat layer exposed Quantity: 1 Electronic Signature(s) Signed: 03/23/2022 9:26:20 AM By: Fredirick Maudlin MD FACS Entered By: Fredirick Maudlin on 03/23/2022 09:26:20 [name] ([id number]) [id number] Page 9 of 9

## 2022-03-24 ENCOUNTER — Ambulatory Visit (INDEPENDENT_AMBULATORY_CARE_PROVIDER_SITE_OTHER): Payer: Medicare HMO | Admitting: Internal Medicine

## 2022-03-24 ENCOUNTER — Encounter: Payer: Self-pay | Admitting: Internal Medicine

## 2022-03-24 ENCOUNTER — Ambulatory Visit (HOSPITAL_BASED_OUTPATIENT_CLINIC_OR_DEPARTMENT_OTHER): Payer: Medicare HMO | Admitting: General Surgery

## 2022-03-24 VITALS — BP 158/74 | HR 81 | Temp 98.0°F | Resp 16 | Ht 60.0 in | Wt 125.5 lb

## 2022-03-24 DIAGNOSIS — I10 Essential (primary) hypertension: Secondary | ICD-10-CM

## 2022-03-24 DIAGNOSIS — Z0000 Encounter for general adult medical examination without abnormal findings: Secondary | ICD-10-CM

## 2022-03-24 DIAGNOSIS — E785 Hyperlipidemia, unspecified: Secondary | ICD-10-CM

## 2022-03-24 DIAGNOSIS — E039 Hypothyroidism, unspecified: Secondary | ICD-10-CM | POA: Diagnosis not present

## 2022-03-24 LAB — BASIC METABOLIC PANEL
BUN: 10 mg/dL (ref 6–23)
CO2: 29 mEq/L (ref 19–32)
Calcium: 9.9 mg/dL (ref 8.4–10.5)
Chloride: 99 mEq/L (ref 96–112)
Creatinine, Ser: 0.74 mg/dL (ref 0.40–1.20)
GFR: 78.51 mL/min (ref >60.00)
Glucose, Bld: 93 mg/dL (ref 70–99)
Potassium: 4 mEq/L (ref 3.5–5.1)
Sodium: 141 mEq/L (ref 135–145)

## 2022-03-24 LAB — TSH: TSH: 1.46 u[IU]/mL (ref 0.35–5.50)

## 2022-03-24 LAB — LIPID PANEL
Cholesterol: 189 mg/dL (ref 0–200)
HDL: 67.6 mg/dL (ref >39.00)
LDL Cholesterol: 109 mg/dL — ABNORMAL HIGH (ref 0–99)
NonHDL: 121.46
Total CHOL/HDL Ratio: 3
Triglycerides: 63 mg/dL (ref 0.0–149.0)
VLDL: 12.6 mg/dL (ref 0.0–40.0)

## 2022-03-24 MED ORDER — METOPROLOL SUCCINATE ER 25 MG PO TB24: 25.0000 mg | ORAL_TABLET | Freq: Every day | ORAL | 1 refills | Status: DC

## 2022-03-24 NOTE — Telephone Encounter (Signed)
Left message on machine to call back to see if she would like rx for Prolia to be sent to pharmacy which will be $100 or would she just like to get it from [place] for $301.  Please let me know if she would like sent to [place] for he to pick up.

## 2022-03-24 NOTE — Patient Instructions (Addendum)
Add a medication called metoprolol XL 25 mg 1 tablet daily. Check the  blood pressure regularly BP GOAL is between 110/65 and  135/85. If it is consistently higher or lower, let me know   Watch your salt intake  You are due for your next Prolia injection 04/01/2022  GO TO THE LAB : Get the blood work     [place], [place] Come back for checkup in 4 months

## 2022-03-24 NOTE — Progress Notes (Signed)
Subjective:    Patient ID: [name], female    DOB: [birth year], 77 y.o.   MRN: [id number]  DOS:  03/24/2022 Type of visit - description: CPX  Here for CPX Has a history of SCC, status post multiple biopsies. She also has a large wound at the right leg, managed by the wound care center. All of the above have her very worried.  She has not been able to exercise for several months.  BP has been elevated, she brought readings for the last month, during December and early January BPs were in the 130s, in the last few days BPs have been in the 150s and even 180s. Denies NSAIDs, admits to stress, see above. ? following a low-salt diet as before. No headaches, no chest pain.    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   Osteoporosis    SCC (squamous cell carcinoma)    Urticaria     Past Surgical History:  Procedure Laterality Date   ABDOMINAL HYSTERECTOMY     no oophorectomy   BREAST LUMPECTOMY     x2 left breast   R leg liposarcoma  1973   SQUAMOUS CELL CARCINOMA EXCISION     RLE   TONSILLECTOMY     Social History   Socioeconomic History   Marital status: Married    Spouse name: Not on file   Number of children: 2   Years of education: Not on file   Highest education level: Not on file  Occupational History   Occupation: retired -- Art therapist, class room assistant   Tobacco Use   Smoking status: Never   Smokeless tobacco: Never  Vaping Use   Vaping Use: Never used  Substance and Sexual Activity   Alcohol use: No   Drug use: No   Sexual activity: Not Currently    Birth control/protection: Post-menopausal  Other Topics Concern   Not on file  Social History Narrative   Lost a son when he was 52 y/o   Lives w/ husband      *02/09/2022 5th squamous cell removal  two weeks ago left leg near ankle - in one year.   Social Determinants of Health   Financial Resource Strain: Low Risk  (06/09/2021)   Overall Financial Resource Strain (CARDIA)    Difficulty of Paying Living Expenses: Not hard at all  Food Insecurity: No Food Insecurity (06/09/2021)   Hunger Vital Sign    Worried About Running Out of Food in the Last Year: Never true    Ran Out of Food in the Last Year: Never true  Transportation Needs: No Transportation Needs (06/09/2021)   PRAPARE - Hydrologist (Medical): No    Lack of Transportation (Non-Medical): No  Physical Activity: Sufficiently Active (06/03/2020)   Exercise Vital Sign    Days of Exercise per Week: 5 days    Minutes of Exercise per Session: 60 min  Stress: No Stress Concern Present (06/09/2021)   [address]    Feeling of Stress : Only a little  Social Connections: Moderately Integrated (06/09/2021)   Social Connection and Isolation Panel [NHANES]    Frequency of Communication with Friends  and Family: More than three times a week    Frequency of Social Gatherings with Friends and Family: Twice a week    Attends Religious Services: More than 4 times per year    Active Member of [employer] or Organizations: No    Attends Archivist Meetings: Never    Marital Status: Married  Human resources officer Violence: Not At Risk (06/09/2021)   Humiliation, Afraid, Rape, and Kick questionnaire    Fear of Current or Ex-Partner: No    Emotionally Abused: No    Physically Abused: No    Sexually Abused: No     Current Outpatient Medications  Medication Instructions   amLODipine (NORVASC) 10 mg, Oral, Daily   atorvastatin (LIPITOR) 20 MG tablet TAKE ONE TABLET BY MOUTH DAILY   calcium citrate-vitamin D (CITRACAL+D) 315-200 MG-UNIT per tablet 1 tablet   cholecalciferol (VITAMIN D) 1,000 Units, Oral, Daily   denosumab (PROLIA) 60 mg, Subcutaneous, Every  6 months   EPINEPHrine (EPI-PEN) 0.3 mg, Intramuscular, As needed   latanoprost (XALATAN) 0.005 % ophthalmic solution 1 drop, Both Eyes, Daily at bedtime   levothyroxine (SYNTHROID) 50 mcg, Oral, Daily before breakfast   metoprolol succinate (TOPROL-XL) 25 mg, Oral, Daily, Take with or immediately following a meal   Multiple Vitamin (MULTIVITAMIN) tablet 1 tablet, Oral, Daily,         Objective:   Physical Exam BP (!) 158/74   Pulse 81   Temp 98 F (36.7 C) (Oral)   Resp 16   Ht 5' (1.524 m)   Wt 125 lb 8 oz (56.9 kg)   SpO2 98%   BMI 24.51 kg/m  General: Well developed, NAD, BMI noted Neck: No  thyromegaly  HEENT:  Normocephalic . Face symmetric, atraumatic Lungs:  CTA B Normal respiratory effort, no intercostal retractions, no accessory muscle use. Heart: RRR,  no murmur.  Abdomen:  Not distended, soft, non-tender. No rebound or rigidity.   Lower extremities: no pretibial edema bilaterally  Skin: Exposed areas without rash. Not pale. Not jaundice Neurologic:  alert & oriented X3.  Speech normal, gait appropriate for age and unassisted Strength symmetric and appropriate for age.  Psych: Cognition and judgment appear intact.  Cooperative with normal attention span and concentration.  Behavior appropriate. No anxious or depressed appearing.     Assessment    Assessment HTN Hyperlipidemia Hypothyroidism Osteoporosis --T score 2013: -2.6,  T score 02-2014  -2.5 after  Reclast x 3. Last reclast 05-2013. T score 02-2016: -2.4. T score -2.6 (04-2018), Rx Prolia.  --Normal Vit D 2014 SCC, skin cancer, right lower extremity, sees derm q 6 months Liposarcoma right leg 1972 (near ankle, posteriorly)   PLAN: Here for CPX HTN: BP is a slightly elevated, currently on amlodipine.  Will add a low-dose of metoprolol XL 25.  Continue monitoring BPs, low-salt diet.  See AVS. Hyperlipidemia: On atorvastatin, check labs. Hypothyroidism: Good compliance with Synthroid, check  TSH Osteoporosis: Next Prolia dose due 04/01/2022, patient aware. SCC skin cancer: Patient reports had multiple biopsies, she also sees a wound care center for a wound on the right leg.  This is causing a lot of stress and worries.  She has been unable to exercise.  Listening therapy provided. RTC 4 months

## 2022-03-26 ENCOUNTER — Encounter: Payer: Self-pay | Admitting: Internal Medicine

## 2022-03-26 NOTE — Assessment & Plan Note (Signed)
-  Td 2023 -  Pneumonia shot 2012, Prevnar 02-2015; PNM 20: 2023 - Zostavax 2008; s/p shingrix x 2 - s/p RSV  -COVID vaccines: Up-to-date -  Had a  flu shot -CCS: 2011 colonoscopy normal Dr. Oletta Lamas; colonoscopy 05/2020:   + Polyps, hyperplastic, no further colonoscopy -Female care: does not see gyn regulalrly, SBE (-),  MMG November 01-2022 ([facility]) -Labs: BMP FLP TSH -Lifestyle discussed -ACP documents on file

## 2022-03-26 NOTE — Assessment & Plan Note (Signed)
Here for CPX HTN: BP is a slightly elevated, currently on amlodipine.  Will add a low-dose of metoprolol XL 25.  Continue monitoring BPs, low-salt diet.  See AVS. Hyperlipidemia: On atorvastatin, check labs. Hypothyroidism: Good compliance with Synthroid, check TSH Osteoporosis: Next Prolia dose due 04/01/2022, patient aware. SCC skin cancer: Patient reports had multiple biopsies, she also sees a wound care center for a wound on the right leg.  This is causing a lot of stress and worries.  She has been unable to exercise.  Listening therapy provided. RTC 4 months

## 2022-03-27 ENCOUNTER — Other Ambulatory Visit (HOSPITAL_COMMUNITY): Payer: Self-pay

## 2022-03-27 MED ORDER — DENOSUMAB 60 MG/ML ~~LOC~~ SOSY
PREFILLED_SYRINGE | SUBCUTANEOUS | 0 refills | Status: DC
  Filled 2022-03-27: qty 1, 180d supply, fill #0

## 2022-03-27 NOTE — Addendum Note (Signed)
Addended by: Kem Boroughs D on: 03/27/2022 03:09 PM   Modules accepted: Orders

## 2022-03-27 NOTE — Telephone Encounter (Signed)
Left message on machine to call back to schedule nurse visit for prolia end of next week to get prolia done.  Will need appt before medication can be sent to our office.

## 2022-03-27 NOTE — Telephone Encounter (Signed)
Patient would like to get from pharmacy.  Need to know if she can pick up or get it sent.

## 2022-03-27 NOTE — Telephone Encounter (Signed)
Pt scheduled for NV

## 2022-03-27 NOTE — Telephone Encounter (Signed)
Pt scheduled for 04/11/21 and rx sent to [facility] specialty pharmacy.  Pharmacy will sent before appointment and will contact patient.

## 2022-03-30 DIAGNOSIS — L2389 Allergic contact dermatitis due to other agents: Secondary | ICD-10-CM | POA: Diagnosis not present

## 2022-03-30 DIAGNOSIS — L905 Scar conditions and fibrosis of skin: Secondary | ICD-10-CM | POA: Diagnosis not present

## 2022-03-30 DIAGNOSIS — C4492 Squamous cell carcinoma of skin, unspecified: Secondary | ICD-10-CM | POA: Diagnosis not present

## 2022-03-30 DIAGNOSIS — C44622 Squamous cell carcinoma of skin of right upper limb, including shoulder: Secondary | ICD-10-CM | POA: Diagnosis not present

## 2022-03-31 ENCOUNTER — Encounter (HOSPITAL_BASED_OUTPATIENT_CLINIC_OR_DEPARTMENT_OTHER): Payer: Medicare HMO | Admitting: General Surgery

## 2022-03-31 DIAGNOSIS — I10 Essential (primary) hypertension: Secondary | ICD-10-CM | POA: Diagnosis not present

## 2022-03-31 DIAGNOSIS — Z923 Personal history of irradiation: Secondary | ICD-10-CM | POA: Diagnosis not present

## 2022-03-31 DIAGNOSIS — L02415 Cutaneous abscess of right lower limb: Secondary | ICD-10-CM | POA: Diagnosis not present

## 2022-03-31 DIAGNOSIS — L97812 Non-pressure chronic ulcer of other part of right lower leg with fat layer exposed: Secondary | ICD-10-CM | POA: Diagnosis not present

## 2022-03-31 DIAGNOSIS — E039 Hypothyroidism, unspecified: Secondary | ICD-10-CM | POA: Diagnosis not present

## 2022-03-31 DIAGNOSIS — E785 Hyperlipidemia, unspecified: Secondary | ICD-10-CM | POA: Diagnosis not present

## 2022-03-31 NOTE — Progress Notes (Signed)
[name], [name] ([id number]) [id number] Page 1 of 9 Visit Report for 03/31/2022 Chief Complaint Document Details Patient Name: Date of Service: [name], [name] 03/31/2022 8:15 A M Medical Record Number: [id number] Patient Account Number: [id number] Date of Birth/Sex: Treating RN: Aug 05, 1945 (77 y.o. F) Primary Care Provider: [name] Other Clinician: Referring Provider: Treating Provider/Extender: [name] in Treatment: 4 Information Obtained from: Patient Chief Complaint Patient seen for complaints of Non-Healing Wound. Electronic Signature(s) Signed: 03/31/2022 8:56:51 AM By: Fredirick Maudlin MD FACS Entered By: Fredirick Maudlin on 03/31/2022 08:56:50 -------------------------------------------------------------------------------- Debridement Details Patient Name: Date of Service: [name], Jaliana [name]. 03/31/2022 8:15 A M Medical Record Number: [id number] Patient Account Number: [id number] Date of Birth/Sex: Treating RN: 15-Mar-1945 (77 y.o. [name] Primary Care Provider: [name] Other Clinician: Referring Provider: Treating Provider/Extender: [name] in Treatment: 4 Debridement Performed for Assessment: Wound #1 Right Achilles Performed By: Physician Fredirick Maudlin, MD Debridement Type: Debridement Level of Consciousness (Pre-procedure): Awake and Alert Pre-procedure Verification/Time Out Yes - 08:47 Taken: Start Time: 08:48 Pain Control: Lidocaine 5% topical ointment T Area Debrided (L x W): otal 2.2 (cm) x 2.9 (cm) = 6.38 (cm) Tissue and other material debrided: Non-Viable, Slough, Slough Level: Non-Viable Tissue Debridement Description: Selective/Open Wound Instrument: Curette Bleeding: Minimum Hemostasis Achieved: Silver Nitrate Response to Treatment: Procedure was tolerated well Level of Consciousness (Post- Awake and Alert procedure): Post Debridement Measurements of Total  Wound Length: (cm) 2.2 Width: (cm) 2.9 Depth: (cm) 0.1 Volume: (cm) 0.501 Character of Wound/Ulcer Post Debridement: Requires Further Debridement Post Procedure Diagnosis Same as Pre-procedure Misty, EICHLER [name] ([id number]) [id number] Page 2 of 9 Notes Scribed for Dr. Celine Ahr by [name], RN Electronic Signature(s) Signed: 03/31/2022 9:00:42 AM By: Fredirick Maudlin MD FACS Signed: 03/31/2022 3:30:08 PM By: [name] RN Entered By: [name] on 03/31/2022 08:50:43 -------------------------------------------------------------------------------- HPI Details Patient Name: Date of Service: [name], [name] 03/31/2022 8:15 A M Medical Record Number: [id number] Patient Account Number: [id number] Date of Birth/Sex: Treating RN: [birth year] (77 y.o. F) Primary Care Provider: [name] Other Clinician: Referring Provider: Treating Provider/Extender: [name] in Treatment: 4 History of Present Illness HPI Description: ADMISSION 03/01/2022 This is a 77 year old otherwise fairly healthy woman (not diabetic, not obese, does not smoke) who presents to the clinic with an ulcer on her right posterior leg. She has a history of a liposarcoma excised in 1973. The patient is not sure but believes she received radiation therapy to the location. She did have a skin graft. About 6 months ago, she developed a small ulcer in the area that seemed to start as an abscess. She has had an open wound in that area since then. A shave biopsy was taken by a dermatologist that showed inflammation without evidence of malignancy. She has been applying Vaseline to the site along with periwound TCA. ABI in clinic today is 0.94. Due to the failure of the wound to heal properly, she has been referred to the wound care center for further evaluation and management. 03/09/2022: The tissue on the wound is protruding further from the skin surface. It does not have a typical  appearance consistent with hypertrophic granulation tissue. I am concerned that it may represent malignancy. 03/17/2022: Fortunately, the biopsy that I took last week was negative for malignancy. It returned as consistent with an ulcer and inflamed granulation tissue. She still has hypertrophic granulation tissue at the site and some slough accumulation  on the surface. No significant change in the wound dimensions. 03/23/2022: The wound measured smaller today and the hypertrophic granulation tissue is less prominent. Due to the unusual shape of her leg from old surgery, however, there has been some friction from her compressive wrap. 03/31/2022: The wound is slightly smaller today. There is light slough on the surface. There is still some hypertrophic granulation tissue present. Electronic Signature(s) Signed: 03/31/2022 8:57:24 AM By: Fredirick Maudlin MD FACS Entered By: Fredirick Maudlin on 03/31/2022 08:57:24 -------------------------------------------------------------------------------- Physical Exam Details Patient Name: Date of Service: [name], Arleny [name]. 03/31/2022 8:15 A M Medical Record Number: [id number] Patient Account Number: [id number] Date of Birth/Sex: Treating RN: 12/06/1945 (77 y.o. F) Primary Care Provider: [name] Other Clinician: Referring Provider: Treating Provider/Extender: Otelia Sergeant Weeks in Treatment: 4 Constitutional Hypertensive, asymptomatic. Slightly bradycardic. . . no acute distress. Respiratory Normal work of breathing on room air. Notes ODDIE, KUHLMANN ([id number]) [id number] Page 3 of 9 03/31/2022: The wound is slightly smaller today. There is light slough on the surface. There is still some hypertrophic granulation tissue present. Electronic Signature(s) Signed: 03/31/2022 8:58:10 AM By: Fredirick Maudlin MD FACS Entered By: Fredirick Maudlin on 03/31/2022  08:58:09 -------------------------------------------------------------------------------- Physician Orders Details Patient Name: Date of Service: [name], Laresha [name]. 03/31/2022 8:15 A M Medical Record Number: [id number] Patient Account Number: [id number] Date of Birth/Sex: Treating RN: 1946-01-27 (77 y.o. [name] Primary Care Provider: [name] Other Clinician: Referring Provider: Treating Provider/Extender: [name] in Treatment: 4 Verbal / Phone Orders: No Diagnosis Coding ICD-10 Coding Code Description [phone] Non-pressure chronic ulcer of other part of right lower leg with fat layer exposed I10 Essential (primary) hypertension Z85.831 Personal history of malignant neoplasm of soft tissue Z92.3 Personal history of irradiation Follow-up Appointments ppointment in 1 week. - Dr. Celine Ahr Room 3 Return A Wednesday, 04/12/22 at 3:45pm ppointment in 2 weeks. - Dr. Celine Ahr RM 4 Return A Monday 04/17/22 at 1:30pm Anesthetic (In clinic) Topical Lidocaine 5% applied to wound bed Cellular or Tissue Based Products Cellular or Tissue Based Product Type: - RUN IVR for Theraskin or Epicord [employer] May shower with protection but do not get wound dressing(s) wet. Protect dressing(s) with water repellant cover (for example, large plastic bag) or a cast cover and may then take shower. - Please do not get leg wraps wet on Right Leg. May use a cast protector on right leg- can purchase from [employer]; Medical supply store; [facility] etc. the cast protector costs approximately $18-$29 Edema Control - Lymphedema / SCD / Other void standing for long periods of time. - Rest and elevate legs throughout the day. A Moisturize legs daily. - Use a lotion or moisturizer for example :Sween,Eucerin;Aquaphor etc. Wound Treatment Wound #1 - Achilles Wound Laterality: Right Cleanser: Soap and Water 1 x Per Week/30 Days Discharge Instructions: May shower and wash wound with dial  antibacterial soap and water prior to dressing change. Cleanser: Wound Cleanser 1 x Per Week/30 Days Discharge Instructions: Cleanse the wound with wound cleanser prior to applying a clean dressing using gauze sponges, not tissue or cotton balls. Peri-Wound Care: Triamcinolone 15 (g) 1 x Per Week/30 Days Discharge Instructions: Use triamcinolone 15 (g) as directed Peri-Wound Care: Sween Lotion (Moisturizing lotion) 1 x Per Week/30 Days Discharge Instructions: Apply moisturizing lotion as directed Prim Dressing: Hydrofera Blue Ready Transfer Foam, 4x5 (in/in) 1 x Per Week/30 Days ary Discharge Instructions: Apply to wound bed as instructed Secondary Dressing:  Woven Gauze Sponge, Non-Sterile 4x4 in 1 x Per Week/30 Days Discharge Instructions: Apply over primary dressing as directed. Secondary Dressing: Zetuvit Plus 4x8 in 1 x Per Week/30 Days Discharge Instructions: Apply over primary dressing as directed. MOSETTA, FERDINAND ([id number]) [id number] Page 4 of 9 Compression Wrap: Kerlix Roll 4.5x3.1 (in/yd) 1 x Per Week/30 Days Discharge Instructions: Apply Kerlix and Coban compression as directed. Compression Wrap: Coban Self-Adherent Wrap 4x5 (in/yd) 1 x Per Week/30 Days Discharge Instructions: Apply over Kerlix as directed. Compression Wrap: Tubular Netting #5 1 x Per Week/30 Days Electronic Signature(s) Signed: 03/31/2022 9:00:42 AM By: Fredirick Maudlin MD FACS Entered By: Fredirick Maudlin on 03/31/2022 08:58:30 -------------------------------------------------------------------------------- Problem List Details Patient Name: Date of Service: [name], Fradel [name]. 03/31/2022 8:15 A M Medical Record Number: [id number] Patient Account Number: [id number] Date of Birth/Sex: Treating RN: 01/19/1946 (77 y.o. F) Primary Care Provider: [name] Other Clinician: Referring Provider: Treating Provider/Extender: Otelia Sergeant Weeks in Treatment: 4 Active  Problems ICD-10 Encounter Code Description Active Date MDM Diagnosis [id number] Non-pressure chronic ulcer of other part of right lower leg with fat layer 03/01/2022 No Yes exposed [place] (primary) hypertension 03/01/2022 No Yes Z85.831 Personal history of malignant neoplasm of soft tissue 03/01/2022 No Yes Z92.3 Personal history of irradiation 03/01/2022 No Yes Inactive Problems Resolved Problems Electronic Signature(s) Signed: 03/31/2022 8:54:01 AM By: Fredirick Maudlin MD FACS Entered By: Fredirick Maudlin on 03/31/2022 08:54:01 -------------------------------------------------------------------------------- Progress Note Details Patient Name: Date of Service: Misty [name], Rosell [name]. 03/31/2022 8:15 A Morton Peters, [name] ([id number]) [id number] Page 5 of 9 Medical Record Number: [id number] Patient Account Number: [id number] Date of Birth/Sex: Treating RN: April 03, 1945 (77 y.o. F) Primary Care Provider: [name] Other Clinician: Referring Provider: Treating Provider/Extender: [name] in Treatment: 4 Subjective Chief Complaint Information obtained from Patient Patient seen for complaints of Non-Healing Wound. History of Present Illness (HPI) ADMISSION 03/01/2022 This is a 77 year old otherwise fairly healthy woman (not diabetic, not obese, does not smoke) who presents to the clinic with an ulcer on her right posterior leg. She has a history of a liposarcoma excised in 1973. The patient is not sure but believes she received radiation therapy to the location. She did have a skin graft. About 6 months ago, she developed a small ulcer in the area that seemed to start as an abscess. She has had an open wound in that area since then. A shave biopsy was taken by a dermatologist that showed inflammation without evidence of malignancy. She has been applying Vaseline to the site along with periwound TCA. ABI in clinic today is 0.94. Due to the  failure of the wound to heal properly, she has been referred to the wound care center for further evaluation and management. 03/09/2022: The tissue on the wound is protruding further from the skin surface. It does not have a typical appearance consistent with hypertrophic granulation tissue. I am concerned that it may represent malignancy. 03/17/2022: Fortunately, the biopsy that I took last week was negative for malignancy. It returned as consistent with an ulcer and inflamed granulation tissue. She still has hypertrophic granulation tissue at the site and some slough accumulation on the surface. No significant change in the wound dimensions. 03/23/2022: The wound measured smaller today and the hypertrophic granulation tissue is less prominent. Due to the unusual shape of her leg from old surgery, however, there has been some friction from her compressive wrap. 03/31/2022: The wound is slightly smaller today. There is  light slough on the surface. There is still some hypertrophic granulation tissue present. Patient History Information obtained from Patient. Family History Unknown History. Social History Never smoker, Marital Status - Married, Alcohol Use - Never, Drug Use - No History, Caffeine Use - Never. Medical History Respiratory Patient has history of Asthma - Childhood asthma Cardiovascular Patient has history of Hypertension Oncologic Patient has history of Received Radiation - R/T Liposarcoma in 1972 Hospitalization/Surgery History - Left Breast Lumpectomy;Tonsillectomy ; Squamous cell carcinoma; Hysterectomy;Right Leg liposarcoma. Medical A Surgical History Notes nd Eyes Pt. states she has pre-glaucoma Cardiovascular Hx: Angio-edema r/t Bee venom; Hyperlipidemia Endocrine Hx: hypothyroidism Immunological Hx: Currently being treated with Immunotherapy (r/t Bee-Venom) Integumentary (Skin) Hx: Urticaria Musculoskeletal Hx: Osteoporosis Oncologic Right Leg Liposarcoma (1972)  Squamous cell carcinoma (bilateral legs)-Biopsy taken to rule out squamous cell carcinoma on right arm and right leg (02/24/22), still pending results Objective Constitutional Hypertensive, asymptomatic. Slightly bradycardic. no acute distress. Vitals Time Taken: 8:24 AM, Height: 60 in, Weight: 126 lbs, BMI: 24.6, Temperature: 98.5 F, Pulse: 56 bpm, Respiratory Rate: 16 breaths/min, Blood Pressure: 155/77 mmHg. MARZELL, ALLEMAND ([id number]) [id number] Page 6 of 9 Respiratory Normal work of breathing on room air. General Notes: 03/31/2022: The wound is slightly smaller today. There is light slough on the surface. There is still some hypertrophic granulation tissue present. Integumentary (Hair, Skin) Wound #1 status is Open. Original cause of wound was Gradually Appeared. The date acquired was: 08/30/2021. The wound has been in treatment 4 weeks. The wound is located on the Right Achilles. The wound measures 2.2cm length x 2.9cm width x 0.1cm depth; 5.011cm^2 area and 0.501cm^3 volume. There is Fat Layer (Subcutaneous Tissue) exposed. There is no tunneling or undermining noted. There is a medium amount of serosanguineous drainage noted. The wound margin is distinct with the outline attached to the wound base. There is medium (34-66%) red granulation within the wound bed. There is a medium (34-66%) amount of necrotic tissue within the wound bed. The periwound skin appearance had no abnormalities noted for texture. The periwound skin appearance had no abnormalities noted for moisture. The periwound skin appearance exhibited: Rubor. Periwound temperature was noted as No Abnormality. Assessment Active Problems ICD-10 Non-pressure chronic ulcer of other part of right lower leg with fat layer exposed Essential (primary) hypertension Personal history of malignant neoplasm of soft tissue Personal history of irradiation Procedures Wound #1 Pre-procedure diagnosis of Wound #1  is an Abscess located on the Right Achilles . There was a Selective/Open Wound Non-Viable Tissue Debridement with a total area of 6.38 sq cm performed by Fredirick Maudlin, MD. With the following instrument(s): Curette to remove Non-Viable tissue/material. Material removed includes [facility] after achieving pain control using Lidocaine 5% topical ointment. No specimens were taken. A time out was conducted at 08:47, prior to the start of the procedure. A Minimum amount of bleeding was controlled with Silver Nitrate. The procedure was tolerated well. Post Debridement Measurements: 2.2cm length x 2.9cm width x 0.1cm depth; 0.501cm^3 volume. Character of Wound/Ulcer Post Debridement requires further debridement. Post procedure Diagnosis Wound #1: Same as Pre-Procedure General Notes: Scribed for Dr. Celine Ahr by [name], RN. Plan Follow-up Appointments: Return Appointment in 1 week. - Dr. Celine Ahr Room 3 Wednesday, 04/12/22 at 3:45pm Return Appointment in 2 weeks. - Dr. Celine Ahr RM 4 Monday 04/17/22 at 1:30pm Anesthetic: (In clinic) Topical Lidocaine 5% applied to wound bed Cellular or Tissue Based Products: Cellular or Tissue Based Product Type: - RUN IVR for Theraskin or Epicord Midwife  Hygiene: May shower with protection but do not get wound dressing(s) wet. Protect dressing(s) with water repellant cover (for example, large plastic bag) or a cast cover and may then take shower. - Please do not get leg wraps wet on Right Leg. May use a cast protector on right leg- can purchase from [employer]; Medical supply store; [facility] etc. the cast protector costs approximately $18-$29 Edema Control - Lymphedema / SCD / Other: Avoid standing for long periods of time. - Rest and elevate legs throughout the day. Moisturize legs daily. - Use a lotion or moisturizer for example :Sween,Eucerin;Aquaphor etc. WOUND #1: - Achilles Wound Laterality: Right Cleanser: Soap and Water 1 x Per Week/30 Days Discharge  Instructions: May shower and wash wound with dial antibacterial soap and water prior to dressing change. Cleanser: Wound Cleanser 1 x Per Week/30 Days Discharge Instructions: Cleanse the wound with wound cleanser prior to applying a clean dressing using gauze sponges, not tissue or cotton balls. Peri-Wound Care: Triamcinolone 15 (g) 1 x Per Week/30 Days Discharge Instructions: Use triamcinolone 15 (g) as directed Peri-Wound Care: Sween Lotion (Moisturizing lotion) 1 x Per Week/30 Days Discharge Instructions: Apply moisturizing lotion as directed Prim Dressing: Hydrofera Blue Ready Transfer Foam, 4x5 (in/in) 1 x Per Week/30 Days ary Discharge Instructions: Apply to wound bed as instructed Secondary Dressing: Woven Gauze Sponge, Non-Sterile 4x4 in 1 x Per Week/30 Days Discharge Instructions: Apply over primary dressing as directed. Secondary Dressing: Zetuvit Plus 4x8 in 1 x Per Week/30 Days Discharge Instructions: Apply over primary dressing as directed. Com pression Wrap: Kerlix Roll 4.5x3.1 (in/yd) 1 x Per Week/30 Days Discharge Instructions: Apply Kerlix and Coban compression as directed. Com pression Wrap: Coban Self-Adherent Wrap 4x5 (in/yd) 1 x Per Week/30 Days Discharge Instructions: Apply over Kerlix as directed. Com pression Wrap: Tubular Netting #5 1 x Per Week/30 Days ANJELIQUE, MAKAR ([id number]) [phone].pdf Page 7 of 9 03/31/2022: The wound is slightly smaller today. There is light slough on the surface. There is still some hypertrophic granulation tissue present. I used a curette to debride the slough off of the wound surface. I then chemically cauterized the hypertrophic granulation tissue with silver nitrate. We will continue Hydrofera Blue with 3 layer compression. I think she might be a good candidate for a skin substitute so we will run her insurance for TheraSkin and Epicord. Follow-up in 1 week. Electronic Signature(s) Signed: 03/31/2022 8:59:17 AM  By: Fredirick Maudlin MD FACS Entered By: Fredirick Maudlin on 03/31/2022 08:59:17 -------------------------------------------------------------------------------- HxROS Details Patient Name: Date of Service: Misty [name], Matisha [name]. 03/31/2022 8:15 A M Medical Record Number: [id number] Patient Account Number: [id number] Date of Birth/Sex: Treating RN: 01-Aug-1945 (77 y.o. F) Primary Care Provider: [name] Other Clinician: Referring Provider: Treating Provider/Extender: [name] in Treatment: 4 Information Obtained From Patient Eyes Medical History: Past Medical History Notes: Pt. states she has pre-glaucoma Respiratory Medical History: Positive for: Asthma - Childhood asthma Cardiovascular Medical History: Positive for: Hypertension Past Medical History Notes: Hx: Angio-edema r/t Bee venom; Hyperlipidemia Endocrine Medical History: Past Medical History Notes: Hx: hypothyroidism Immunological Medical History: Past Medical History Notes: Hx: Currently being treated with Immunotherapy (r/t Bee-Venom) Integumentary (Skin) Medical History: Past Medical History Notes: Hx: Urticaria Musculoskeletal Medical History: Past Medical History Notes: Hx: Osteoporosis Oncologic Medical HistoryPHILLIPPA, STRAUB ([id number]) [id number] Page 8 of 9 Positive for: Received Radiation - R/T Liposarcoma in 1972 Past Medical History Notes: 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)-Biopsy taken to rule  out squamous cell carcinoma on right arm and right leg (02/24/22), still pending results Immunizations Pneumococcal Vaccine: Received Pneumococcal Vaccination: Yes Received Pneumococcal Vaccination On or After 60th Birthday: Yes Implantable Devices No devices added Hospitalization / Surgery History Type of Hospitalization/Surgery Left Breast Lumpectomy;Tonsillectomy ; Squamous cell carcinoma; Hysterectomy;Right Leg  liposarcoma Family and Social History Unknown History: Yes; Never smoker; Marital Status - Married; Alcohol Use: Never; Drug Use: No History; Caffeine Use: Never; Financial Concerns: No; Food, Clothing or Shelter Needs: No; Support System Lacking: No; Transportation Concerns: No Electronic Signature(s) Signed: 03/31/2022 9:00:42 AM By: Fredirick Maudlin MD FACS Entered By: Fredirick Maudlin on 03/31/2022 08:57:41 -------------------------------------------------------------------------------- SuperBill Details Patient Name: Date of Service: Misty Abbott Pao 03/31/2022 Medical Record Number: [id number] Patient Account Number: [id number] Date of Birth/Sex: Treating RN: 03/09/1945 (77 y.o. F) Primary Care Provider: [name] Other Clinician: Referring Provider: Treating Provider/Extender: Otelia Sergeant Weeks in Treatment: 4 Diagnosis Coding ICD-10 Codes Code Description [id number] Non-pressure chronic ulcer of other part of right lower leg with fat layer exposed [place] (primary) hypertension Z85.831 Personal history of malignant neoplasm of soft tissue Z92.3 Personal history of irradiation Facility Procedures : CPT4 Code: [id number] Description: [phone] - DEBRIDE WOUND 1ST 20 SQ CM OR < ICD-10 Diagnosis Description L97.812 Non-pressure chronic ulcer of other part of right lower leg with fat layer expos Modifier: ed Quantity: 1 Physician Procedures : CPT4 Code Description Modifier [id number] - WC PHYS LEVEL 3 - EST PT 25 ICD-10 Diagnosis Description L97.812 Non-pressure chronic ulcer of other part of right lower leg with fat layer exposed Z85.831 Personal history of malignant neoplasm of soft  tissue Z92.3 Personal history of irradiation I10 Essential (primary) hypertension Quantity: 1 : [id number] [id number] - WC PHYS DEBR WO ANESTH 20 SQ CM ICD-10 Diagnosis Description Q59.563 Non-pressure chronic ulcer of other part of right lower leg with fat layer exposed TELICIA, HODGKISS [name]  ([id number]) [phone].pdf Page 9 o Quantity: 1 f 9 Electronic Signature(s) Signed: 03/31/2022 9:00:31 AM By: Fredirick Maudlin MD FACS Entered By: Fredirick Maudlin on 03/31/2022 09:00:30

## 2022-03-31 NOTE — Progress Notes (Signed)
[name], [name] ([id number]) [id number] Page 1 of 7 Visit Report for 03/31/2022 Arrival Information Details Patient Name: Date of Service: [name] 03/31/2022 8:15 A M Medical Record Number: [id number] Patient Account Number: [id number] Date of Birth/Sex: Treating RN: 1945/08/05 (77 y.o. [name], [name] Primary Care Zharia Conrow: [name] Other Clinician: Referring Leandrea Ackley: Treating Raquelle Pietro/Extender: [name] in Treatment: 4 Visit Information History Since Last Visit Added or deleted any medications: No Patient Arrived: Ambulatory Any new allergies or adverse reactions: No Arrival Time: 08:17 Had a fall or experienced change in No Accompanied By: spouse activities of daily living that may affect Transfer Assistance: None risk of falls: Patient Requires Transmission-Based Precautions: No Signs or symptoms of abuse/neglect since last visito No Hospitalized since last visit: No Implantable device outside of the clinic excluding No cellular tissue based products placed in the center since last visit: Has Compression in Place as Prescribed: Yes Pain Present Now: No Electronic Signature(s) Signed: 03/31/2022 3:30:08 PM By: [name] RN Entered By: [name] on 03/31/2022 08:24:19 -------------------------------------------------------------------------------- Encounter Discharge Information Details Patient Name: Date of Service: [name], [name] 03/31/2022 8:15 A M Medical Record Number: [id number] Patient Account Number: [id number] Date of Birth/Sex: Treating RN: [birth year] (77 y.o. [name] Primary Care Pyper Olexa: [name] Other Clinician: Referring Bunnie Lederman: Treating Madissen Wyse/Extender: [name] in Treatment: 4 Encounter Discharge Information Items Post Procedure Vitals Discharge Condition: Stable Temperature (F): 98.5 Ambulatory Status: Ambulatory Pulse (bpm): 56 Discharge Destination:  Home Respiratory Rate (breaths/min): 16 Transportation: Private Auto Blood Pressure (mmHg): 155/77 Accompanied By: self Schedule Follow-up Appointment: Yes Clinical Summary of Care: Electronic Signature(s) Signed: 03/31/2022 3:30:08 PM By: [name] RN Entered By: [name] on 03/31/2022 09:05:04 [name] ([id number]) [id number] Page 2 of 7 -------------------------------------------------------------------------------- Lower Extremity Assessment Details Patient Name: Date of Service: [name] 03/31/2022 8:15 A M Medical Record Number: [id number] Patient Account Number: [id number] Date of Birth/Sex: Treating RN: April 01, 1945 (77 y.o. [name] Primary Care Angles Trevizo: [name] Other Clinician: Referring Lajuanna Pompa: Treating Jazma Pickel/Extender: [name] in Treatment: 4 Edema Assessment Assessed: [Left: No] [Right: No] [Left: Edema] [Right: :] Calf Left: Right: Point of Measurement: 33 cm From Medial Instep 30.8 cm Ankle Left: Right: Point of Measurement: 9 cm From Medial Instep 19.4 cm Vascular Assessment Pulses: Dorsalis Pedis Palpable: [Right:Yes] Electronic Signature(s) Signed: 03/31/2022 3:30:08 PM By: [name] RN Entered By: [name] on 03/31/2022 08:30:31 -------------------------------------------------------------------------------- Multi Wound Chart Details Patient Name: Date of Service: [name], [name] 03/31/2022 8:15 A M Medical Record Number: [id number] Patient Account Number: [id number] Date of Birth/Sex: Treating RN: 10/25/1945 (77 y.o. F) Primary Care Gevorg Brum: [name] Other Clinician: Referring Marai Teehan: Treating Abisola Carrero/Extender: [name] in Treatment: 4 Vital Signs Height(in): 60 Pulse(bpm): 56 Weight(lbs): 126 Blood Pressure(mmHg): 155/77 Body Mass Index(BMI): 24.6 Temperature(F): 98.5 Respiratory Rate(breaths/min): 16 [1:Photos:]  [N/A:N/A] Right Achilles N/A N/A Wound Location: Gradually Appeared N/A N/A Wounding Event: Abscess N/A N/A Primary Etiology: Asthma, Hypertension, Received N/A N/A Comorbid History: Radiation 08/30/2021 N/A N/A Date Acquired: 4 N/A N/A Weeks of Treatment: Open N/A N/A Wound Status: No N/A N/A Wound Recurrence: 2.2x2.9x0.1 N/A N/A Measurements L x W x [name] (cm) 5.011 N/A N/A A (cm) : rea 0.501 N/A N/A Volume (cm) : 24.90% N/A N/A % Reduction in A rea: 25.00% N/A N/A % Reduction in Volume: Full Thickness Without Exposed N/A N/A Classification: Support Structures  Medium N/A N/A Exudate A mount: Serosanguineous N/A N/A Exudate Type: red, brown N/A N/A Exudate Color: Distinct, outline attached N/A N/A Wound Margin: Medium (34-66%) N/A N/A Granulation A mount: Red N/A N/A Granulation Quality: Medium (34-66%) N/A N/A Necrotic A mount: Fat Layer (Subcutaneous Tissue): Yes N/A N/A Exposed Structures: Fascia: No Tendon: No Muscle: No Joint: No Bone: No Small (1-33%) N/A N/A Epithelialization: Debridement - Selective/Open Wound N/A N/A Debridement: Pre-procedure Verification/Time Out 08:47 N/A N/A Taken: Lidocaine 5% topical ointment N/A N/A Pain Control: Slough N/A N/A Tissue Debrided: Non-Viable Tissue N/A N/A Level: 6.38 N/A N/A Debridement A (sq cm): rea Curette N/A N/A Instrument: Minimum N/A N/A Bleeding: Silver Nitrate N/A N/A Hemostasis A chieved: Procedure was tolerated well N/A N/A Debridement Treatment Response: 2.2x2.9x0.1 N/A N/A Post Debridement Measurements L x W x [name] (cm) 0.501 N/A N/A Post Debridement Volume: (cm) Scarring: Yes N/A N/A Periwound Skin Texture: No Abnormalities Noted N/A N/A Periwound Skin Moisture: Rubor: Yes N/A N/A Periwound Skin Color: No Abnormality N/A N/A Temperature: Debridement N/A N/A Procedures Performed: Treatment Notes Electronic Signature(s) Signed: 03/31/2022 8:56:45 AM By: Fredirick Maudlin  MD FACS Entered By: Fredirick Maudlin on 03/31/2022 08:56:45 -------------------------------------------------------------------------------- Multi-Disciplinary Care Plan Details Patient Name: Date of Service: [name], [name] 03/31/2022 8:15 A M Medical Record Number: [id number] Patient Account Number: [id number] Date of Birth/Sex: Treating RN: 1946/01/07 (77 y.o. [name] Primary Care Karess Harner: [name] Other Clinician: Referring Hugh Kamara: Treating Elanna Bert/Extender: [name] in Treatment: 4 Active Inactive Wound/Skin Impairment TEXIE, TUPOU ([id number]) [id number] Page 4 of 7 Nursing Diagnoses: Knowledge deficit related to ulceration/compromised skin integrity Goals: Patient/caregiver will verbalize understanding of skin care regimen Date Initiated: 03/01/2022 Target Resolution Date: 05/04/2022 Goal Status: Active Interventions: Assess ulceration(s) every visit Treatment Activities: Skin care regimen initiated : 03/01/2022 Notes: Electronic Signature(s) Signed: 03/31/2022 3:30:08 PM By: [name] RN Entered By: [name] on 03/31/2022 08:53:13 -------------------------------------------------------------------------------- Pain Assessment Details Patient Name: Date of Service: [name], [name] 03/31/2022 8:15 A M Medical Record Number: [id number] Patient Account Number: [id number] Date of Birth/Sex: Treating RN: 1946/01/19 (77 y.o. [name] Primary Care Jahlen Bollman: [name] Other Clinician: Referring Ariam Mol: Treating Franshesca Chipman/Extender: [name] in Treatment: 4 Active Problems Location of Pain Severity and Description of Pain Patient Has Paino No Site Locations Rate the pain. Current Pain Level: 0 Pain Management and Medication Current Pain Management: Electronic Signature(s) Signed: 03/31/2022 3:30:08 PM By: [name] RN Entered By: [name] on 03/31/2022  08:30:21 [name], [name] ([id number]) [id number] Page 5 of 7 -------------------------------------------------------------------------------- Patient/Caregiver Education Details Patient Name: Date of Service: [name], [name] 1/26/2024andnbsp8:15 A M Medical Record Number: [id number] Patient Account Number: [id number] Date of Birth/Gender: Treating RN: 06/23/1945 (77 y.o. [name] Primary Care Physician: [name] Other Clinician: Referring Physician: Treating Physician/Extender: [name] in Treatment: 4 Education Assessment Education Provided To: Patient Education Topics Provided Wound Debridement: Methods: Explain/Verbal Responses: Reinforcements needed, State content correctly Wound/Skin Impairment: Methods: Explain/Verbal Responses: Reinforcements needed, State content correctly Electronic Signature(s) Signed: 03/31/2022 3:30:08 PM By: [name] RN Entered By: [name] on 03/31/2022 08:40:04 -------------------------------------------------------------------------------- Wound Assessment Details Patient Name: Date of Service: [name], [name] 03/31/2022 8:15 A M Medical Record Number: [id number] Patient Account Number: [id number] Date of Birth/Sex: Treating RN: Jun 19, 1945 (77 y.o. [name] Primary Care Peggyann Zwiefelhofer: [name] Other Clinician: Referring Bilan Tedesco: Treating Delayne Sanzo/Extender: Tonette Lederer, Ashley Murrain in  Treatment: 4 Wound Status Wound Number: 1 Primary Etiology: Abscess Wound Location: Right Achilles Wound Status: Open Wounding Event: Gradually Appeared Comorbid History: Asthma, Hypertension, Received Radiation Date Acquired: 08/30/2021 Weeks Of Treatment: 4 Clustered Wound: No Photos [name], DINGER ([id number]) [id number] Page 6 of 7 Wound Measurements Length: (cm) 2.2 Width: (cm) 2.9 Depth: (cm) 0.1 Area: (cm) 5.011 Volume: (cm) 0.501 % Reduction in  Area: 24.9% % Reduction in Volume: 25% Epithelialization: Small (1-33%) Tunneling: No Undermining: No Wound Description Classification: Full Thickness Without Exposed Support Structures Wound Margin: Distinct, outline attached Exudate Amount: Medium Exudate Type: Serosanguineous Exudate Color: red, brown Foul Odor After Cleansing: No Slough/Fibrino Yes Wound Bed Granulation Amount: Medium (34-66%) Exposed Structure Granulation Quality: Red Fascia Exposed: No Necrotic Amount: Medium (34-66%) Fat Layer (Subcutaneous Tissue) Exposed: Yes Tendon Exposed: No Muscle Exposed: No Joint Exposed: No Bone Exposed: No Periwound Skin Texture Texture Color No Abnormalities Noted: Yes No Abnormalities Noted: No Rubor: Yes Moisture No Abnormalities Noted: Yes Temperature / Pain Temperature: No Abnormality Treatment Notes Wound #1 (Achilles) Wound Laterality: Right Cleanser Soap and Water Discharge Instruction: May shower and wash wound with dial antibacterial soap and water prior to dressing change. Wound Cleanser Discharge Instruction: Cleanse the wound with wound cleanser prior to applying a clean dressing using gauze sponges, not tissue or cotton balls. Peri-Wound Care Triamcinolone 15 (g) Discharge Instruction: Use triamcinolone 15 (g) as directed Sween Lotion (Moisturizing lotion) Discharge Instruction: Apply moisturizing lotion as directed Topical Primary Dressing Hydrofera Blue Ready Transfer Foam, 4x5 (in/in) Discharge Instruction: Apply to wound bed as instructed Secondary Dressing Woven Gauze Sponge, Non-Sterile 4x4 in Discharge Instruction: Apply over primary dressing as directed. Zetuvit Plus 4x8 in Discharge Instruction: Apply over primary dressing as directed. Secured With Compression Wrap Kerlix Roll 4.5x3.1 (in/yd) Discharge Instruction: Apply Kerlix and Coban compression as directed. Coban Self-Adherent Wrap 4x5 (in/yd) Discharge Instruction: Apply over  Kerlix as directed. Tubular Netting #5 Compression Stockings Add-Ons Electronic Signature(s) [name], [name] ([id number]) [id number] Page 7 of 7 Signed: 03/31/2022 3:30:08 PM By: [name] RN Entered By: [name] on 03/31/2022 08:41:55 -------------------------------------------------------------------------------- Vitals Details Patient Name: Date of Service: [name], [name] 03/31/2022 8:15 A M Medical Record Number: [id number] Patient Account Number: [id number] Date of Birth/Sex: Treating RN: 12/17/1945 (77 y.o. [name], [name] Primary Care Macil Crady: [name] Other Clinician: Referring Emaline Karnes: Treating Camay Pedigo/Extender: [name] in Treatment: 4 Vital Signs Time Taken: 08:24 Temperature (F): 98.5 Height (in): 60 Pulse (bpm): 56 Weight (lbs): 126 Respiratory Rate (breaths/min): 16 Body Mass Index (BMI): 24.6 Blood Pressure (mmHg): 155/77 Reference Range: 80 - 120 mg / dl Electronic Signature(s) Signed: 03/31/2022 3:30:08 PM By: [name] RN Entered By: [name] on 03/31/2022 08:26:34

## 2022-04-04 ENCOUNTER — Other Ambulatory Visit (HOSPITAL_COMMUNITY): Payer: Self-pay

## 2022-04-05 ENCOUNTER — Other Ambulatory Visit (HOSPITAL_COMMUNITY): Payer: Self-pay

## 2022-04-05 ENCOUNTER — Other Ambulatory Visit: Payer: Self-pay | Admitting: Internal Medicine

## 2022-04-06 ENCOUNTER — Ambulatory Visit (INDEPENDENT_AMBULATORY_CARE_PROVIDER_SITE_OTHER): Payer: Medicare HMO

## 2022-04-06 DIAGNOSIS — T63441D Toxic effect of venom of bees, accidental (unintentional), subsequent encounter: Secondary | ICD-10-CM | POA: Diagnosis not present

## 2022-04-10 ENCOUNTER — Other Ambulatory Visit (HOSPITAL_COMMUNITY): Payer: Self-pay

## 2022-04-11 ENCOUNTER — Ambulatory Visit (INDEPENDENT_AMBULATORY_CARE_PROVIDER_SITE_OTHER): Payer: Medicare HMO

## 2022-04-11 ENCOUNTER — Other Ambulatory Visit (HOSPITAL_COMMUNITY): Payer: Self-pay

## 2022-04-11 DIAGNOSIS — M810 Age-related osteoporosis without current pathological fracture: Secondary | ICD-10-CM

## 2022-04-11 MED ORDER — DENOSUMAB 60 MG/ML ~~LOC~~ SOSY
60.0000 mg | PREFILLED_SYRINGE | Freq: Once | SUBCUTANEOUS | Status: AC
  Administered 2022-04-11: 60 mg via SUBCUTANEOUS

## 2022-04-11 NOTE — Progress Notes (Signed)
Pt here for Prolia shot per Dr. Larose Kells. Shot given in left SQ. Pt handled well.

## 2022-04-12 ENCOUNTER — Telehealth: Payer: Self-pay | Admitting: *Deleted

## 2022-04-12 ENCOUNTER — Encounter (HOSPITAL_BASED_OUTPATIENT_CLINIC_OR_DEPARTMENT_OTHER): Payer: Medicare HMO | Attending: General Surgery | Admitting: General Surgery

## 2022-04-12 ENCOUNTER — Other Ambulatory Visit (HOSPITAL_BASED_OUTPATIENT_CLINIC_OR_DEPARTMENT_OTHER): Payer: Self-pay

## 2022-04-12 ENCOUNTER — Other Ambulatory Visit (HOSPITAL_COMMUNITY): Payer: Self-pay

## 2022-04-12 DIAGNOSIS — L97812 Non-pressure chronic ulcer of other part of right lower leg with fat layer exposed: Secondary | ICD-10-CM | POA: Insufficient documentation

## 2022-04-12 DIAGNOSIS — L97818 Non-pressure chronic ulcer of other part of right lower leg with other specified severity: Secondary | ICD-10-CM | POA: Diagnosis not present

## 2022-04-12 DIAGNOSIS — Z808 Family history of malignant neoplasm of other organs or systems: Secondary | ICD-10-CM | POA: Diagnosis not present

## 2022-04-12 DIAGNOSIS — Z923 Personal history of irradiation: Secondary | ICD-10-CM | POA: Diagnosis not present

## 2022-04-12 DIAGNOSIS — I10 Essential (primary) hypertension: Secondary | ICD-10-CM | POA: Diagnosis not present

## 2022-04-12 DIAGNOSIS — D2272 Melanocytic nevi of left lower limb, including hip: Secondary | ICD-10-CM | POA: Diagnosis not present

## 2022-04-12 DIAGNOSIS — D225 Melanocytic nevi of trunk: Secondary | ICD-10-CM | POA: Diagnosis not present

## 2022-04-12 DIAGNOSIS — Z85831 Personal history of malignant neoplasm of soft tissue: Secondary | ICD-10-CM | POA: Insufficient documentation

## 2022-04-12 DIAGNOSIS — D485 Neoplasm of uncertain behavior of skin: Secondary | ICD-10-CM | POA: Diagnosis not present

## 2022-04-12 DIAGNOSIS — L02415 Cutaneous abscess of right lower limb: Secondary | ICD-10-CM | POA: Diagnosis not present

## 2022-04-12 DIAGNOSIS — L821 Other seborrheic keratosis: Secondary | ICD-10-CM | POA: Diagnosis not present

## 2022-04-12 DIAGNOSIS — L570 Actinic keratosis: Secondary | ICD-10-CM | POA: Diagnosis not present

## 2022-04-12 DIAGNOSIS — L97212 Non-pressure chronic ulcer of right calf with fat layer exposed: Secondary | ICD-10-CM | POA: Diagnosis not present

## 2022-04-12 DIAGNOSIS — Z85828 Personal history of other malignant neoplasm of skin: Secondary | ICD-10-CM | POA: Diagnosis not present

## 2022-04-12 DIAGNOSIS — Z5189 Encounter for other specified aftercare: Secondary | ICD-10-CM | POA: Diagnosis not present

## 2022-04-12 DIAGNOSIS — D2362 Other benign neoplasm of skin of left upper limb, including shoulder: Secondary | ICD-10-CM | POA: Diagnosis not present

## 2022-04-12 DIAGNOSIS — D2271 Melanocytic nevi of right lower limb, including hip: Secondary | ICD-10-CM | POA: Diagnosis not present

## 2022-04-12 DIAGNOSIS — L578 Other skin changes due to chronic exposure to nonionizing radiation: Secondary | ICD-10-CM | POA: Diagnosis not present

## 2022-04-12 DIAGNOSIS — D2262 Melanocytic nevi of left upper limb, including shoulder: Secondary | ICD-10-CM | POA: Diagnosis not present

## 2022-04-12 NOTE — Telephone Encounter (Signed)
Prolia was given on 04/11/22

## 2022-04-13 DIAGNOSIS — C44722 Squamous cell carcinoma of skin of right lower limb, including hip: Secondary | ICD-10-CM | POA: Diagnosis not present

## 2022-04-13 NOTE — Progress Notes (Signed)
Misty, [name] ([id number]) [id number] Page 1 of 10 Visit Report for 04/12/2022 Chief Complaint Document Details Patient Name: Date of Service: [name], [name] 04/12/2022 3:45 PM Medical Record Number: [id number] Patient Account Number: [id number] Date of Birth/Sex: Treating RN: Jun 03, 1945 (77 y.o. F) Primary Care Provider: [name] Other Clinician: Referring Provider: Treating Provider/Extender: [name] in Treatment: 6 Information Obtained from: Patient Chief Complaint Patient seen for complaints of Non-Healing Wound. Electronic Signature(s) Signed: 04/12/2022 5:17:33 PM By: Fredirick Maudlin MD FACS Entered By: Fredirick Maudlin on 04/12/2022 17:17:32 -------------------------------------------------------------------------------- Debridement Details Patient Name: Date of Service: [name], [name] 04/12/2022 3:45 PM Medical Record Number: [id number] Patient Account Number: [id number] Date of Birth/Sex: Treating RN: 04-20-1945 (77 y.o. [name] Primary Care Provider: [name] Other Clinician: Referring Provider: Treating Provider/Extender: [name] in Treatment: 6 Debridement Performed for Assessment: Wound #2 Right,Posterior Lower Leg Performed By: Physician Fredirick Maudlin, MD Debridement Type: Debridement Level of Consciousness (Pre-procedure): Awake and Alert Pre-procedure Verification/Time Out Yes - 16:11 Taken: Start Time: 16:11 Pain Control: Lidocaine 4% T opical Solution T Area Debrided (L x W): otal 0.6 (cm) x 1.5 (cm) = 0.9 (cm) Tissue and other material debrided: Non-Viable, Slough, Slough Level: Non-Viable Tissue Debridement Description: Selective/Open Wound Instrument: Curette Bleeding: Minimum Hemostasis Achieved: Pressure Response to Treatment: Procedure was tolerated well Level of Consciousness (Post- Awake and Alert procedure): Post Debridement Measurements of Total  Wound Length: (cm) 0.5 Width: (cm) 1.5 Depth: (cm) 0.1 Volume: (cm) 0.059 Character of Wound/Ulcer Post Debridement: Improved Post Procedure Diagnosis Same as Pre-procedure [name], [name] ([id number]) [id number] Page 2 of 10 Notes scribed for Dr. Celine Ahr by Adline Peals, RN Electronic Signature(s) Signed: 04/12/2022 4:35:29 PM By: Adline Peals Signed: 04/12/2022 5:26:12 PM By: Fredirick Maudlin MD FACS Entered By: Adline Peals on 04/12/2022 16:13:13 -------------------------------------------------------------------------------- Debridement Details Patient Name: Date of Service: [name], [name] 04/12/2022 3:45 PM Medical Record Number: [id number] Patient Account Number: [id number] Date of Birth/Sex: Treating RN: December 07, 1945 (77 y.o. [name] Primary Care Provider: [name] Other Clinician: Referring Provider: Treating Provider/Extender: [name] in Treatment: 6 Debridement Performed for Assessment: Wound #1 Right Achilles Performed By: Physician Fredirick Maudlin, MD Debridement Type: Debridement Level of Consciousness (Pre-procedure): Awake and Alert Pre-procedure Verification/Time Out Yes - 16:11 Taken: Start Time: 16:11 Pain Control: Lidocaine 4% T opical Solution T Area Debrided (L x W): otal 2.2 (cm) x 2.5 (cm) = 5.5 (cm) Tissue and other material debrided: Non-Viable, Slough, Subcutaneous, Slough Level: Skin/Subcutaneous Tissue Debridement Description: Excisional Instrument: Curette Bleeding: Minimum Hemostasis Achieved: Pressure Response to Treatment: Procedure was tolerated well Level of Consciousness (Post- Awake and Alert procedure): Post Debridement Measurements of Total Wound Length: (cm) 2.2 Width: (cm) 2.5 Depth: (cm) 0.1 Volume: (cm) 0.432 Character of Wound/Ulcer Post Debridement: Improved Post Procedure Diagnosis Same as Pre-procedure Notes scribed for Dr. Celine Ahr by Adline Peals, RN Electronic Signature(s) Signed: 04/12/2022 4:35:29 PM By: Adline Peals Signed: 04/12/2022 5:26:12 PM By: Fredirick Maudlin MD FACS Entered By: Adline Peals on 04/12/2022 16:15:52 -------------------------------------------------------------------------------- HPI Details Patient Name: Date of Service: [name] 04/12/2022 3:45 PM Medical Record Number: [id number] Patient Account Number: [id number] Date of Birth/Sex: Treating RN: 04-Jul-1945 (77 y.o. F) Primary Care Provider: [name] Other Clinician: LAZARIA, SCHABEN ([id number]) [id number] Page 3 of 10 Referring Provider: Treating Provider/Extender: Tonette Lederer, Ashley Murrain in Treatment: 6 History of Present Illness HPI Description: ADMISSION 03/01/2022  This is a 77 year old otherwise fairly healthy woman (not diabetic, not obese, does not smoke) who presents to the clinic with an ulcer on her right posterior leg. She has a history of a liposarcoma excised in 1973. The patient is not sure but believes she received radiation therapy to the location. She did have a skin graft. About 6 months ago, she developed a small ulcer in the area that seemed to start as an abscess. She has had an open wound in that area since then. A shave biopsy was taken by a dermatologist that showed inflammation without evidence of malignancy. She has been applying Vaseline to the site along with periwound TCA. ABI in clinic today is 0.94. Due to the failure of the wound to heal properly, she has been referred to the wound care center for further evaluation and management. 03/09/2022: The tissue on the wound is protruding further from the skin surface. It does not have a typical appearance consistent with hypertrophic granulation tissue. I am concerned that it may represent malignancy. 03/17/2022: Fortunately, the biopsy that I took last week was negative for malignancy. It returned as consistent with an ulcer  and inflamed granulation tissue. She still has hypertrophic granulation tissue at the site and some slough accumulation on the surface. No significant change in the wound dimensions. 03/23/2022: The wound measured smaller today and the hypertrophic granulation tissue is less prominent. Due to the unusual shape of her leg from old surgery, however, there has been some friction from her compressive wrap. 03/31/2022: The wound is slightly smaller today. There is light slough on the surface. There is still some hypertrophic granulation tissue present. 04/12/2022: She has a new wound near the top of her surgical defect. Where the skin rolls over, there has been some friction and abrasion that has resulted in tissue breakdown. There is slough buildup on the surface. The original wound is smaller and there is epithelialization around the borders. There is slough buildup on the surface again. She has been approved for TheraSkin but it is unclear what her financial responsibility would be at this point. We are still working on determining that. 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 Electronic Signature(s) Signed: 04/12/2022 5:19:35 PM By: Fredirick Maudlin MD FACS Entered By: Fredirick Maudlin on 04/12/2022 17:19:35 -------------------------------------------------------------------------------- Physical Exam Details Patient Name: Date of Service: [place], [name] 04/12/2022 3:45 PM Medical Record Number: [id number] Patient Account Number: [id number] Date of Birth/Sex: Treating RN: 10/31/1945 (77 y.o. F) Primary Care Provider: [name] Other Clinician: Referring Provider: Treating Provider/Extender: Otelia Sergeant Weeks in Treatment: 6 Constitutional Hypertensive, asymptomatic. . . . no acute distress. Notes 04/12/2022: She has a new wound near the top of her surgical defect. Where the skin rolls over, there has been some friction  and abrasion that has resulted in tissue breakdown. There is slough buildup on the surface. The original wound is smaller and there is epithelialization around the borders. There is slough buildup on the surface again. Electronic Signature(s) Signed: 04/12/2022 5:20:50 PM By: Fredirick Maudlin MD FACS Entered By: Fredirick Maudlin on 04/12/2022 17:20:50 -------------------------------------------------------------------------------- Physician Orders Details Patient Name: Date of Service: [name], [name] 04/12/2022 3:45 PM Medical Record Number: [id number] Patient Account Number: [id number] Date of Birth/Sex: Treating RN: 03-15-1945 (77 y.o. [name] Primary Care Provider: [name] Other Clinician: NARALY, FRITCHER ([id number]) [id number] Page 4 of 10 Referring Provider: Treating Provider/Extender: Fredirick Maudlin  Paz, Jose Weeks in Treatment: 6 Verbal / Phone Orders: No Diagnosis Coding ICD-10 Coding Code Description [phone] Non-pressure chronic ulcer of other part of right lower leg with fat layer exposed I10 Essential (primary) hypertension Z85.831 Personal history of malignant neoplasm of soft tissue Z92.3 Personal history of irradiation Follow-up Appointments ppointment in 1 week. - Dr. Celine Ahr Room 3 or 4 Return A Anesthetic (In clinic) Topical Lidocaine 4% applied to wound bed Cellular or Tissue Based Products Cellular or Tissue Based Product Type: - RUN IVR for Theraskin or Epicord [employer] May shower with protection but do not get wound dressing(s) wet. Protect dressing(s) with water repellant cover (for example, large plastic bag) or a cast cover and may then take shower. - Please do not get leg wraps wet on Right Leg. May use a cast protector on right leg- can purchase from [employer]; Medical supply store; [facility] etc. the cast protector costs approximately $18-$29 Edema Control - Lymphedema / SCD / Other void standing for long  periods of time. - Rest and elevate legs throughout the day. A Moisturize legs daily. - Use a lotion or moisturizer for example :Sween,Eucerin;Aquaphor etc. Wound Treatment Wound #1 - Achilles Wound Laterality: Right Cleanser: Soap and Water 1 x Per Week/30 Days Discharge Instructions: May shower and wash wound with dial antibacterial soap and water prior to dressing change. Cleanser: Wound Cleanser 1 x Per Week/30 Days Discharge Instructions: Cleanse the wound with wound cleanser prior to applying a clean dressing using gauze sponges, not tissue or cotton balls. Prim Dressing: Hydrofera Blue Ready Transfer Foam, 4x5 (in/in) 1 x Per Week/30 Days ary Discharge Instructions: Apply to wound bed as instructed Secondary Dressing: Zetuvit Plus Silicone Border Dressing 4x4 (in/in) 1 x Per Week/30 Days Discharge Instructions: Apply silicone border over primary dressing as directed. Wound #2 - Lower Leg Wound Laterality: Right, Posterior Cleanser: Soap and Water 1 x Per Week/30 Days Discharge Instructions: May shower and wash wound with dial antibacterial soap and water prior to dressing change. Cleanser: Wound Cleanser 1 x Per Week/30 Days Discharge Instructions: Cleanse the wound with wound cleanser prior to applying a clean dressing using gauze sponges, not tissue or cotton balls. Prim Dressing: Sorbalgon AG Dressing 2x2 (in/in) 1 x Per Week/30 Days ary Discharge Instructions: Apply to wound bed as instructed Secondary Dressing: Zetuvit Plus Silicone Border Dressing 4x4 (in/in) 1 x Per Week/30 Days Discharge Instructions: Apply silicone border over primary dressing as directed. Patient Medications llergies: bee venom protein (honey bee), moxifloxacin, promethazine HCl, balsam [place], thimerosal A Notifications Medication Indication Start End 04/12/2022 lidocaine DOSE topical 4 % cream - cream topical Electronic Signature(s) Signed: 04/12/2022 5:26:12 PM By: Fredirick Maudlin MD FACS Previous  Signature: 04/12/2022 4:35:29 PM Version By: Adline Peals Entered By: Fredirick Maudlin on 04/12/2022 17:21:11 [name] ([id number]) [id number] Page 5 of 10 -------------------------------------------------------------------------------- Problem List Details Patient Name: Date of Service: NAVNEET, SCHMUCK 04/12/2022 3:45 PM Medical Record Number: [id number] Patient Account Number: [id number] Date of Birth/Sex: Treating RN: 02-Jan-1946 (77 y.o. F) Primary Care Provider: [name] Other Clinician: Referring Provider: Treating Provider/Extender: Otelia Sergeant Weeks in Treatment: 6 Active Problems ICD-10 Encounter Code Description Active Date MDM Diagnosis [phone] Non-pressure chronic ulcer of other part of right lower leg with fat layer 03/01/2022 No Yes exposed [place] (primary) hypertension 03/01/2022 No Yes Z85.831 Personal history of malignant neoplasm of soft tissue 03/01/2022 No Yes Z92.3 Personal history of irradiation 03/01/2022 No Yes Inactive Problems Resolved Problems  Electronic Signature(s) Signed: 04/12/2022 5:17:11 PM By: Fredirick Maudlin MD FACS Entered By: Fredirick Maudlin on 04/12/2022 17:17:11 -------------------------------------------------------------------------------- Progress Note Details Patient Name: Date of Service: [place], [name] 04/12/2022 3:45 PM Medical Record Number: [id number] Patient Account Number: [id number] Date of Birth/Sex: Treating RN: Aug 16, 1945 (77 y.o. F) Primary Care Provider: [name] Other Clinician: Referring Provider: Treating Provider/Extender: [name] in Treatment: 6 Subjective Chief Complaint Information obtained from Patient Patient seen for complaints of Non-Healing Wound. History of Present Illness (HPI) ADMISSION 03/01/2022 This is a 77 year old otherwise fairly healthy woman (not diabetic, not obese, does not smoke) who presents to the clinic with  an ulcer on her right posterior leg. KRISTA, GODSIL ([id number]) [id number] Page 6 of 10 She has a history of a liposarcoma excised in 1973. The patient is not sure but believes she received radiation therapy to the location. She did have a skin graft. About 6 months ago, she developed a small ulcer in the area that seemed to start as an abscess. She has had an open wound in that area since then. A shave biopsy was taken by a dermatologist that showed inflammation without evidence of malignancy. She has been applying Vaseline to the site along with periwound TCA. ABI in clinic today is 0.94. Due to the failure of the wound to heal properly, she has been referred to the wound care center for further evaluation and management. 03/09/2022: The tissue on the wound is protruding further from the skin surface. It does not have a typical appearance consistent with hypertrophic granulation tissue. I am concerned that it may represent malignancy. 03/17/2022: Fortunately, the biopsy that I took last week was negative for malignancy. It returned as consistent with an ulcer and inflamed granulation tissue. She still has hypertrophic granulation tissue at the site and some slough accumulation on the surface. No significant change in the wound dimensions. 03/23/2022: The wound measured smaller today and the hypertrophic granulation tissue is less prominent. Due to the unusual shape of her leg from old surgery, however, there has been some friction from her compressive wrap. 03/31/2022: The wound is slightly smaller today. There is light slough on the surface. There is still some hypertrophic granulation tissue present. 04/12/2022: She has a new wound near the top of her surgical defect. Where the skin rolls over, there has been some friction and abrasion that has resulted in tissue breakdown. There is slough buildup on the surface. The original wound is smaller and there is epithelialization  around the borders. There is slough buildup on the surface again. She has been approved for TheraSkin but it is unclear what her financial responsibility would be at this point. We are still working on determining that. 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 Patient History Information obtained from Patient. Family History Unknown History. Social History Never smoker, Marital Status - Married, Alcohol Use - Never, Drug Use - No History, Caffeine Use - Never. Medical History Respiratory Patient has history of Asthma - Childhood asthma Cardiovascular Patient has history of Hypertension Oncologic Patient has history of Received Radiation - R/T Liposarcoma in 1972 Hospitalization/Surgery History - Left Breast Lumpectomy;Tonsillectomy ; Squamous cell carcinoma; Hysterectomy;Right Leg liposarcoma. Medical A Surgical History Notes nd Eyes Pt. states she has pre-glaucoma Cardiovascular Hx: Angio-edema r/t Bee venom; Hyperlipidemia Endocrine Hx: hypothyroidism Immunological Hx: Currently being treated with Immunotherapy (r/t Bee-Venom) Integumentary (Skin) Hx: Urticaria Musculoskeletal Hx: Osteoporosis Oncologic Right Leg  Liposarcoma (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 (bilateral legs)-Biopsy taken to rule out squamous cell carcinoma on right arm and right leg (02/24/22), still pending results Objective Constitutional Hypertensive, asymptomatic. no acute distress. Vitals Time Taken: 3:46 PM, Height: 60 in, Weight: 126 lbs, BMI: 24.6, Temperature: 98.2 F, Pulse: 69 bpm, Respiratory Rate: 16 breaths/min, Blood Pressure: 150/75 mmHg. General Notes: 04/12/2022: She has a new wound near the top of her surgical defect. Where the skin rolls over, there has been some friction and abrasion that has resulted in tissue breakdown. There is slough buildup on the surface. The original wound is smaller and there is epithelialization  around the borders. There is slough buildup on the surface again. Integumentary (Hair, Skin) Wound #1 status is Open. Original cause of wound was Gradually Appeared. The date acquired was: 08/30/2021. The wound has been in treatment 6 weeks. The wound is located on the Right Achilles. The wound measures 2.2cm length x 2.5cm width x 0.1cm depth; 4.32cm^2 area and 0.432cm^3 volume. There is Fat Layer (Subcutaneous Tissue) exposed. There is no tunneling or undermining noted. There is a medium amount of serosanguineous drainage noted. The wound margin is distinct with the outline attached to the wound base. There is medium (34-66%) red granulation within the wound bed. There is a medium (34-66%) amount of necrotic tissue within the wound bed. The periwound skin appearance had no abnormalities noted for texture. The periwound skin appearance had no abnormalities noted for moisture. The periwound skin appearance exhibited: Rubor. Periwound temperature was noted as No Abnormality. TEXIE, TUPOU ([id number]) [id number] Page 7 of 10 Wound #2 status is Open. Original cause of wound was Shear/Friction. The date acquired was: 04/08/2022. The wound is located on the Right,Posterior Lower Leg. The wound measures 0.6cm length x 1.5cm width x 0.1cm depth; 0.707cm^2 area and 0.071cm^3 volume. There is Fat Layer (Subcutaneous Tissue) exposed. There is no tunneling or undermining noted. There is a medium amount of serosanguineous drainage noted. The wound margin is distinct with the outline attached to the wound base. There is small (1-33%) red, pink granulation within the wound bed. There is a large (67-100%) amount of necrotic tissue within the wound bed including Adherent Slough. The periwound skin appearance had no abnormalities noted for texture. The periwound skin appearance had no abnormalities noted for moisture. The periwound skin appearance exhibited: Rubor. Periwound temperature was  noted as No Abnormality. Assessment Active Problems ICD-10 Non-pressure chronic ulcer of other part of right lower leg with fat layer exposed Essential (primary) hypertension Personal history of malignant neoplasm of soft tissue Personal history of irradiation Procedures Wound #1 Pre-procedure diagnosis of Wound #1 is an Abscess located on the Right Achilles . There was a Excisional Skin/Subcutaneous Tissue Debridement with a total area of 5.5 sq cm performed by Fredirick Maudlin, MD. With the following instrument(s): Curette to remove Non-Viable tissue/material. Material removed includes Subcutaneous Tissue and Slough and after achieving pain control using Lidocaine 4% T opical Solution. No specimens were taken. A time out was conducted at 16:11, prior to the start of the procedure. A Minimum amount of bleeding was controlled with Pressure. The procedure was tolerated well. Post Debridement Measurements: 2.2cm length x 2.5cm width x 0.1cm depth; 0.432cm^3 volume. Character of Wound/Ulcer Post Debridement is improved. Post procedure Diagnosis Wound #1: Same as Pre-Procedure General Notes: scribed for Dr. Celine Ahr by Adline Peals, RN. Wound #2 Pre-procedure diagnosis of Wound #2 is a T be determined located on the Right,Posterior Lower Leg . There was a Selective/Open  Wound Non-Viable Tissue o Debridement with a total area of 0.9 sq cm performed by Fredirick Maudlin, MD. With the following instrument(s): Curette to remove Non-Viable tissue/material. Material removed includes [facility] after achieving pain control using Lidocaine 4% Topical Solution. No specimens were taken. A time out was conducted at 16:11, prior to the start of the procedure. A Minimum amount of bleeding was controlled with Pressure. The procedure was tolerated well. Post Debridement Measurements: 0.5cm length x 1.5cm width x 0.1cm depth; 0.059cm^3 volume. Character of Wound/Ulcer Post Debridement is improved. Post procedure  Diagnosis Wound #2: Same as Pre-Procedure General Notes: scribed for Dr. Celine Ahr by Adline Peals, RN. Plan Follow-up Appointments: Return Appointment in 1 week. - Dr. Celine Ahr Room 3 or 4 Anesthetic: (In clinic) Topical Lidocaine 4% applied to wound bed Cellular or Tissue Based Products: Cellular or Tissue Based Product Type: - RUN IVR for Theraskin or Epicord Bathing/ Shower/ Hygiene: May shower with protection but do not get wound dressing(s) wet. Protect dressing(s) with water repellant cover (for example, large plastic bag) or a cast cover and may then take shower. - Please do not get leg wraps wet on Right Leg. May use a cast protector on right leg- can purchase from [employer]; Medical supply store; [facility] etc. the cast protector costs approximately $18-$29 Edema Control - Lymphedema / SCD / Other: Avoid standing for long periods of time. - Rest and elevate legs throughout the day. Moisturize legs daily. - Use a lotion or moisturizer for example :Sween,Eucerin;Aquaphor etc. The following medication(s) was prescribed: lidocaine topical 4 % cream cream topical was prescribed at facility WOUND #1: - Achilles Wound Laterality: Right Cleanser: Soap and Water 1 x Per Week/30 Days Discharge Instructions: May shower and wash wound with dial antibacterial soap and water prior to dressing change. Cleanser: Wound Cleanser 1 x Per Week/30 Days Discharge Instructions: Cleanse the wound with wound cleanser prior to applying a clean dressing using gauze sponges, not tissue or cotton balls. Prim Dressing: Hydrofera Blue Ready Transfer Foam, 4x5 (in/in) 1 x Per Week/30 Days ary Discharge Instructions: Apply to wound bed as instructed Secondary Dressing: Zetuvit Plus Silicone Border Dressing 4x4 (in/in) 1 x Per Week/30 Days Discharge Instructions: Apply silicone border over primary dressing as directed. WOUND #2: - Lower Leg Wound Laterality: Right, Posterior Cleanser: Soap and Water 1 x Per Week/30  Days Discharge Instructions: May shower and wash wound with dial antibacterial soap and water prior to dressing change. Cleanser: Wound Cleanser 1 x Per Week/30 Days Discharge Instructions: Cleanse the wound with wound cleanser prior to applying a clean dressing using gauze sponges, not tissue or cotton balls. Prim Dressing: Sorbalgon AG Dressing 2x2 (in/in) 1 x Per Week/30 Days ary Discharge Instructions: Apply to wound bed as instructed Secondary Dressing: Zetuvit Plus Silicone Border Dressing 4x4 (in/in) 1 x Per Week/30 Days Discharge Instructions: Apply silicone border over primary dressing as directed. [name], [name] ([id number]) [id number] Page 8 of 10 04/12/2022: She has a new wound near the top of her surgical defect. Where the skin rolls over, there has been some friction and abrasion that has resulted in tissue breakdown. There is slough buildup on the surface. The original wound is smaller and there is epithelialization around the borders. There is slough buildup on the surface again. I used a curette to debride slough from the new wound on her calf. I debrided slough and nonviable subcutaneous tissue from the wound at her Achilles. We will continue Hydrofera Blue at the Achilles and will use  silver alginate on the calf. As she is having surgery tomorrow, we will not wrap her leg; we will just put foam border dressings over each wound. She will follow-up in 1 week. Electronic Signature(s) Signed: 04/12/2022 5:22:22 PM By: Fredirick Maudlin MD FACS Entered By: Fredirick Maudlin on 04/12/2022 17:22:22 -------------------------------------------------------------------------------- HxROS Details Patient Name: Date of Service: [place], [name] 04/12/2022 3:45 PM Medical Record Number: [id number] Patient Account Number: [id number] Date of Birth/Sex: Treating RN: 1945-10-22 (77 y.o. 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 Respiratory Medical History: Positive for: Asthma - Childhood asthma Cardiovascular Medical History: Positive for: Hypertension Past Medical History Notes: Hx: Angio-edema r/t Bee venom; Hyperlipidemia Endocrine Medical History: Past Medical History Notes: Hx: hypothyroidism Immunological Medical History: Past Medical History Notes: Hx: Currently being treated with Immunotherapy (r/t Bee-Venom) Integumentary (Skin) Medical History: Past Medical History Notes: Hx: Urticaria Musculoskeletal Medical History: Past Medical History Notes: Hx: Osteoporosis [place], NYLUND ([id number]) [id number] Page 9 of 10 Oncologic Medical History: Positive for: Received Radiation - R/T Liposarcoma in 1972 Past Medical History Notes: 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)-Biopsy taken to rule out squamous cell carcinoma on right arm and right leg (02/24/22), still pending results Immunizations Pneumococcal Vaccine: Received Pneumococcal Vaccination: Yes Received Pneumococcal Vaccination On or After 60th Birthday: Yes Implantable Devices No devices added Hospitalization / Surgery History Type of Hospitalization/Surgery Left Breast Lumpectomy;Tonsillectomy ; Squamous cell carcinoma; Hysterectomy;Right Leg liposarcoma Family and Social History Unknown History: Yes; Never smoker; Marital Status - Married; Alcohol Use: Never; Drug Use: No History; Caffeine Use: Never; Financial Concerns: No; Food, Clothing or Shelter Needs: No; Support System Lacking: No; Transportation Concerns: No Electronic Signature(s) Signed: 04/12/2022 5:26:12 PM By: Fredirick Maudlin MD FACS Entered By: Fredirick Maudlin on 04/12/2022  17:20:00 -------------------------------------------------------------------------------- SuperBill Details Patient Name: Date of Service: [place] [facility] Pao 04/12/2022 Medical Record Number: [id number] Patient Account Number: [id number] Date of Birth/Sex: Treating RN: 1945/07/09 (77 y.o. F) Primary Care Provider: [name] Other Clinician: Referring Provider: Treating Provider/Extender: Otelia Sergeant Weeks in Treatment: 6 Diagnosis Coding ICD-10 Codes Code Description [phone] Non-pressure chronic ulcer of other part of right lower leg with fat layer exposed [place] (primary) hypertension Z85.831 Personal history of malignant neoplasm of soft tissue Z92.3 Personal history of irradiation Facility Procedures : CPT4 Code: [id number] Description: [place] - DEB SUBQ TISSUE 20 SQ CM/< ICD-10 Diagnosis Description A35.573 Non-pressure chronic ulcer of other part of right lower leg with fat layer expos Z92.3 Personal history of irradiation Modifier: ed Quantity: 1 : CPT4 Code: [id number] Description: 06237 - DEBRIDE WOUND 1ST 20 SQ CM OR < ICD-10 Diagnosis Description L97.812 Non-pressure chronic ulcer of other part of right lower leg with fat layer expos Modifier: ed Quantity: 1 Physician Procedures : CPT4 Code Description Modifier [id number] - WC PHYS LEVEL 4 - EST PT 25 ICD-10 Diagnosis Description L97.812 Non-pressure chronic ulcer of other part of right lower leg with fat layer exposed [place], Ricardo [phone]  [id number] Non-pressure chronic ulcer of other part of right lower leg with fat layer exposed Z85.831 Personal history of malignant neoplasm of soft tissue Z92.3 Personal history of irradiation I10 Essential (primary)  hypertension Quantity: 1 Page 10 of 10 : [id number] 11042 - WC PHYS SUBQ TISS 20 SQ CM 1 ICD-10 Diagnosis Description L97.812  Non-pressure chronic ulcer of other part of right lower leg with fat layer exposed Z92.3  Personal history of irradiation Quantity: : [id number] - WC PHYS DEBR WO ANESTH 20 SQ CM 1 ICD-10 Diagnosis Description H43.014 Non-pressure chronic ulcer of other part of right lower leg with fat layer exposed Quantity: Electronic Signature(s) Signed: 04/12/2022 5:24:45 PM By: Fredirick Maudlin MD FACS Entered By: Fredirick Maudlin on 04/12/2022 17:24:45

## 2022-04-17 ENCOUNTER — Encounter (HOSPITAL_BASED_OUTPATIENT_CLINIC_OR_DEPARTMENT_OTHER): Payer: Medicare HMO | Admitting: General Surgery

## 2022-04-17 DIAGNOSIS — L988 Other specified disorders of the skin and subcutaneous tissue: Secondary | ICD-10-CM | POA: Diagnosis not present

## 2022-04-17 DIAGNOSIS — L02415 Cutaneous abscess of right lower limb: Secondary | ICD-10-CM | POA: Diagnosis not present

## 2022-04-17 DIAGNOSIS — I10 Essential (primary) hypertension: Secondary | ICD-10-CM | POA: Diagnosis not present

## 2022-04-17 DIAGNOSIS — L97818 Non-pressure chronic ulcer of other part of right lower leg with other specified severity: Secondary | ICD-10-CM | POA: Diagnosis not present

## 2022-04-17 DIAGNOSIS — S81801A Unspecified open wound, right lower leg, initial encounter: Secondary | ICD-10-CM | POA: Diagnosis not present

## 2022-04-17 DIAGNOSIS — Z85831 Personal history of malignant neoplasm of soft tissue: Secondary | ICD-10-CM | POA: Diagnosis not present

## 2022-04-17 DIAGNOSIS — Z923 Personal history of irradiation: Secondary | ICD-10-CM | POA: Diagnosis not present

## 2022-04-17 DIAGNOSIS — L97812 Non-pressure chronic ulcer of other part of right lower leg with fat layer exposed: Secondary | ICD-10-CM | POA: Diagnosis not present

## 2022-04-17 NOTE — Progress Notes (Signed)
Misty, [name] ([id number]) [id number] Page 1 of 12 Visit Report for 04/17/2022 Arrival Information Details Patient Name: Date of Service: [name], [name] 04/17/2022 1:30 PM Medical Record Number: [id number] Patient Account Number: [id number] Date of Birth/Sex: Treating RN: [birth year] (76 y.o. [name], [name] Primary Care Lillis Nuttle: [name] Other Clinician: Referring Johntay Doolen: Treating Netanya Yazdani/Extender: [name] in Treatment: 6 Visit Information History Since Last Visit Added or deleted any medications: No Patient Arrived: Ambulatory Any new allergies or adverse reactions: No Arrival Time: 13:44 Had a fall or experienced change in No Accompanied By: self activities of daily living that may affect Transfer Assistance: None risk of falls: Patient Identification Verified: Yes Signs or symptoms of abuse/neglect since last visito No Secondary Verification Process Completed: Yes Hospitalized since last visit: No Patient Requires Transmission-Based Precautions: No Implantable device outside of the clinic excluding No cellular tissue based products placed in the center since last visit: Has Dressing in Place as Prescribed: Yes Pain Present Now: No Electronic Signature(s) Signed: 04/17/2022 4:55:37 PM By: [name] RN Entered By: [name] on 04/17/2022 13:44:59 -------------------------------------------------------------------------------- Compression Therapy Details Patient Name: Date of Service: [name], [name] 04/17/2022 1:30 PM Medical Record Number: [id number] Patient Account Number: [id number] Date of Birth/Sex: Treating RN: 22-Aug-1945 (77 y.o. [name] Primary Care Sunnie Odden: [name] Other Clinician: Referring Tanveer Brammer: Treating Arvie Villarruel/Extender: [name] in Treatment: 6 Compression Therapy Performed for Wound Assessment: Wound #1 Right Achilles Performed By: Clinician [name],  RN Compression Type: Three Layer Post Procedure Diagnosis Same as Pre-procedure Electronic Signature(s) Signed: 04/17/2022 4:35:46 PM By: [name] RN Entered By: [name] on 04/17/2022 16:35:46 [name] ([id number]) [id number] Page 2 of 12 -------------------------------------------------------------------------------- Compression Therapy Details Patient Name: Date of Service: [name], [name] 04/17/2022 1:30 PM Medical Record Number: [id number] Patient Account Number: [id number] Date of Birth/Sex: Treating RN: 03/28/1945 (77 y.o. [name] Primary Care Johnesha Acheampong: [name] Other Clinician: Referring Kaiyah Eber: Treating Oskar Cretella/Extender: [name] in Treatment: 6 Compression Therapy Performed for Wound Assessment: Wound #2 Right,Posterior Lower Leg Performed By: Clinician [name], RN Compression Type: Three Layer Post Procedure Diagnosis Same as Pre-procedure Electronic Signature(s) Signed: 04/17/2022 4:35:47 PM By: [name] RN Entered By: [name] on 04/17/2022 16:35:46 -------------------------------------------------------------------------------- Compression Therapy Details Patient Name: Date of Service: [name], [name] 04/17/2022 1:30 PM Medical Record Number: [id number] Patient Account Number: [id number] Date of Birth/Sex: Treating RN: 1945-04-18 (77 y.o. [name] Primary Care Graydon Fofana: [name] Other Clinician: Referring Jayce Boyko: Treating Memorie Yokoyama/Extender: [name] in Treatment: 6 Compression Therapy Performed for Wound Assessment: Wound #3 Right,Anterior Lower Leg Performed By: Clinician [name], RN Compression Type: Three Layer Post Procedure Diagnosis Same as Pre-procedure Electronic Signature(s) Signed: 04/17/2022 4:35:47 PM By: [name] RN Entered By: [name] on 04/17/2022  16:35:47 -------------------------------------------------------------------------------- Encounter Discharge Information Details Patient Name: Date of Service: [name], [name] 04/17/2022 1:30 PM Medical Record Number: [id number] Patient Account Number: [id number] Date of Birth/Sex: Treating RN: [birth year] (77 y.o. [name] Primary Care Allysia Ingles: [name] Other Clinician: Referring Hamda Klutts: Treating Arnice Vanepps/Extender: [name] in Treatment: 6 Encounter Discharge Information Items Post Procedure Vitals Discharge Condition: Stable Temperature (F): 98.3 Ambulatory Status: Ambulatory Pulse (bpm): 70 Discharge Destination: Home Respiratory Rate (breaths/min): 16 Transportation: Private Auto Blood Pressure (mmHg): 148/75 Accompanied By: self Schedule Follow-up Appointment: Yes Clinical Summary of Care: [name], Misty  [name] ([id number]) [id number] Page 3 of 12 Electronic Signature(s) Signed: 04/17/2022 4:55:37 PM By: [name] RN Entered By: [name] on 04/17/2022 14:25:20 -------------------------------------------------------------------------------- Lower Extremity Assessment Details Patient Name: Date of Service: [name], [name] 04/17/2022 1:30 PM Medical Record Number: [id number] Patient Account Number: [id number] Date of Birth/Sex: Treating RN: 21-Apr-1945 (77 y.o. [name] Primary Care Nghia Mcentee: [name] Other Clinician: Referring Webster Patrone: Treating Simone Tuckey/Extender: [name] in Treatment: 6 Edema Assessment Assessed: [Left: No] [Right: No] [Left: Edema] [Right: :] Calf Left: Right: Point of Measurement: 33 cm From Medial Instep 34 cm Ankle Left: Right: Point of Measurement: 9 cm From Medial Instep 19.5 cm Vascular Assessment Pulses: Dorsalis Pedis Palpable: [Right:Yes] Electronic Signature(s) Signed: 04/17/2022 4:55:37 PM By: [name] RN Entered By: [name] on  04/17/2022 13:51:31 -------------------------------------------------------------------------------- Multi Wound Chart Details Patient Name: Date of Service: [name], [name] 04/17/2022 1:30 PM Medical Record Number: [id number] Patient Account Number: [id number] Date of Birth/Sex: Treating RN: 04-19-1945 (77 y.o. F) Primary Care Montina Dorrance: [name] Other Clinician: Referring Maddyn Lieurance: Treating Trishelle Devora/Extender: [name] in Treatment: 6 Vital Signs Height(in): 60 Pulse(bpm): 70 Weight(lbs): 126 Blood Pressure(mmHg): 148/75 Body Mass Index(BMI): 24.6 Temperature(F): 98.3 Respiratory Rate(breaths/min): 16 [1:Photos:] [3:No Photos [id number] Page 4 of 12] Right Achilles Right, Posterior Lower Leg Right, Anterior Lower Leg Wound Location: Gradually Appeared Shear/Friction Other Lesion Wounding Event: Abscess T be determined o Lesion Primary Etiology: Asthma, Hypertension, Received Asthma, Hypertension, Received Asthma, Hypertension, Received Comorbid History: Radiation Radiation Radiation 08/30/2021 04/08/2022 03/09/2022 Date Acquired: 6 0 0 Weeks of Treatment: Open Open Open Wound Status: No No No Wound Recurrence: 2.1x2.3x0.1 0.4x1.5x0.1 1.7x1.5x0.1 Measurements L x W x D (cm) 3.793 0.471 2.003 A (cm) : rea 0.379 0.047 0.2 Volume (cm) : 43.20% 33.40% N/A % Reduction in A rea: 43.30% 33.80% N/A % Reduction in Volume: Full Thickness Without Exposed Full Thickness Without Exposed Full Thickness Without Exposed Classification: Support Structures Support Structures Support Structures Medium Medium Medium Exudate A mount: Serosanguineous Serosanguineous Serous Exudate Type: red, brown red, brown amber Exudate Color: Distinct, outline attached Distinct, outline attached N/A Wound Margin: Medium (34-66%) Small (1-33%) Medium (34-66%) Granulation A mount: Red, Hyper-granulation Red, Pink N/A Granulation  Quality: Medium (34-66%) Large (67-100%) Medium (34-66%) Necrotic A mount: Fat Layer (Subcutaneous Tissue): Yes Fat Layer (Subcutaneous Tissue): Yes Fat Layer (Subcutaneous Tissue): Yes Exposed Structures: Fascia: No Fascia: No Fascia: No Tendon: No Tendon: No Tendon: No Muscle: No Muscle: No Muscle: No Joint: No Joint: No Joint: No Bone: No Bone: No Bone: No Small (1-33%) None N/A Epithelialization: Debridement - Excisional Debridement - Excisional Debridement - Selective/Open Wound Debridement: Pre-procedure Verification/Time Out 14:03 14:03 14:03 Taken: Lidocaine 5% topical ointment Lidocaine 5% topical ointment Lidocaine 5% topical ointment Pain Control: Subcutaneous, Slough Subcutaneous, [employer] Tissue Debrided: Skin/Subcutaneous Tissue Skin/Subcutaneous Tissue Non-Viable Tissue Level: 4.83 0.6 2.55 Debridement A (sq cm): rea Curette Curette Curette Instrument: Minimum Minimum Minimum Bleeding: Silver Nitrate Pressure Silver Nitrate Hemostasis A chieved: Procedure was tolerated well Procedure was tolerated well Procedure was tolerated well Debridement Treatment Response: 2.1x2.3x0.1 0.4x1.5x0.1 1.7x1.5x0.1 Post Debridement Measurements L x W x D (cm) 0.379 0.047 0.2 Post Debridement Volume: (cm) Scarring: Yes No Abnormalities Noted No Abnormalities Noted Periwound Skin Texture: No Abnormalities Noted No Abnormalities Noted Dry/Scaly: Yes Periwound Skin Moisture: Rubor: Yes Rubor: Yes Rubor: Yes Periwound Skin Color: No Abnormality No Abnormality No Abnormality Temperature: N/A N/A Yes Tenderness on Palpation: Debridement Debridement Debridement Procedures  Performed: Treatment Notes Wound #1 (Achilles) Wound Laterality: Right Cleanser Soap and Water Discharge Instruction: May shower and wash wound with dial antibacterial soap and water prior to dressing change. Wound Cleanser Discharge Instruction: Cleanse the wound with wound cleanser  prior to applying a clean dressing using gauze sponges, not tissue or cotton balls. Peri-Wound Care Topical Primary Dressing Hydrofera Blue Ready Transfer Foam, 4x5 (in/in) Discharge Instruction: Apply to wound bed as instructed Secondary Dressing Zetuvit Plus Silicone Border Dressing 4x4 (in/in) Discharge Instruction: Apply silicone border over primary dressing as directed. Secured With JEROLYN, OSTERLUND ([id number]) [id number] Page 5 of 12 Compression Wrap Compression Stockings Add-Ons Wound #2 (Lower Leg) Wound Laterality: Right, Posterior Cleanser Soap and Water Discharge Instruction: May shower and wash wound with dial antibacterial soap and water prior to dressing change. Wound Cleanser Discharge Instruction: Cleanse the wound with wound cleanser prior to applying a clean dressing using gauze sponges, not tissue or cotton balls. Peri-Wound Care Topical Primary Dressing Hydrofera Blue Ready Transfer Foam, 2.5x2.5 (in/in) Discharge Instruction: Apply directly to wound bed as directed Secondary Dressing Zetuvit Plus Silicone Border Dressing 4x4 (in/in) Discharge Instruction: Apply silicone border over primary dressing as directed. Secured With Compression Wrap Compression Stockings Add-Ons Wound #3 (Lower Leg) Wound Laterality: Right, Anterior Cleanser Soap and Water Discharge Instruction: May shower and wash wound with dial antibacterial soap and water prior to dressing change. Wound Cleanser Discharge Instruction: Cleanse the wound with wound cleanser prior to applying a clean dressing using gauze sponges, not tissue or cotton balls. Peri-Wound Care Zinc Oxide Ointment 30g tube Discharge Instruction: Apply Zinc Oxide to periwound with each dressing change Sween Lotion (Moisturizing lotion) Discharge Instruction: Apply moisturizing lotion as directed Topical Primary Dressing Promogran Prisma Matrix, 4.34 (sq in) (silver collagen) Discharge  Instruction: Moisten collagen with hydrogel Secondary Dressing Zetuvit Plus Silicone Border Dressing 4x4 (in/in) Discharge Instruction: Apply silicone border over primary dressing as directed. Secured With Compression Wrap Compression Stockings Environmental education officer) Signed: 04/17/2022 2:32:15 PM By: Fredirick Maudlin MD FACS Entered By: Fredirick Maudlin on 04/17/2022 14:32:14 [name] ([id number]) [id number] Page 6 of 12 -------------------------------------------------------------------------------- Multi-Disciplinary Care Plan Details Patient Name: Date of Service: [name], [name] 04/17/2022 1:30 PM Medical Record Number: [id number] Patient Account Number: [id number] Date of Birth/Sex: Treating RN: 10/02/1945 (77 y.o. [name] Primary Care Deajah Erkkila: [name] Other Clinician: Referring Caylie Sandquist: Treating Selah Klang/Extender: [name] in Treatment: 6 Active Inactive Wound/Skin Impairment Nursing Diagnoses: Knowledge deficit related to ulceration/compromised skin integrity Goals: Patient/caregiver will verbalize understanding of skin care regimen Date Initiated: 03/01/2022 Target Resolution Date: 06/02/2022 Goal Status: Active Interventions: Assess ulceration(s) every visit Treatment Activities: Skin care regimen initiated : 03/01/2022 Notes: Electronic Signature(s) Signed: 04/17/2022 4:55:37 PM By: [name] RN Entered By: [name] on 04/17/2022 13:55:51 -------------------------------------------------------------------------------- Pain Assessment Details Patient Name: Date of Service: [name], [name] 04/17/2022 1:30 PM Medical Record Number: [id number] Patient Account Number: [id number] Date of Birth/Sex: Treating RN: Jul 30, 1945 (77 y.o. [name] Primary Care Anne-Marie Genson: [name] Other Clinician: Referring Bengie Kaucher: Treating Clevie Prout/Extender: [name] in  Treatment: 6 Active Problems Location of Pain Severity and Description of Pain Patient Has Paino No Site Locations Rate the pain. TANAJIA, BRAMLET ([id number]) [id number] Page 7 of 12 Rate the pain. Current Pain Level: 0 Pain Management and Medication Current Pain Management: Electronic Signature(s) Signed: 04/17/2022 4:55:37 PM By: [name] RN Entered By: [name] on 04/17/2022 13:51:24 -------------------------------------------------------------------------------- Patient/Caregiver Education  Details Patient Name: Date of Service: [name], [name] 2/12/2024andnbsp1:30 PM Medical Record Number: [id number] Patient Account Number: [id number] Date of Birth/Gender: Treating RN: 08/31/1945 (77 y.o. [name] Primary Care Physician: [name] Other Clinician: Referring Physician: Treating Physician/Extender: [name] in Treatment: 6 Education Assessment Education Provided To: Patient Education Topics Provided Wound Debridement: Methods: Explain/Verbal Responses: Reinforcements needed, State content correctly Wound/Skin Impairment: Methods: Explain/Verbal Responses: Reinforcements needed, State content correctly Electronic Signature(s) Signed: 04/17/2022 4:55:37 PM By: [name] RN Entered By: [name] on 04/17/2022 13:56:11 -------------------------------------------------------------------------------- Wound Assessment Details Patient Name: Date of Service: [name], [name] 04/17/2022 1:30 PM [name] ([id number]) [id number] Page 8 of 12 Medical Record Number: [id number] Patient Account Number: [id number] Date of Birth/Sex: Treating RN: 12/26/1945 (77 y.o. [name], [name] Primary Care Mekenzie Modeste: [name] Other Clinician: Referring Janalyn Higby: Treating Ashton Sabine/Extender: Otelia Sergeant Weeks in Treatment: 6 Wound Status Wound Number: 1 Primary Etiology:  Abscess Wound Location: Right Achilles Wound Status: Open Wounding Event: Gradually Appeared Comorbid History: Asthma, Hypertension, Received Radiation Date Acquired: 08/30/2021 Weeks Of Treatment: 6 Clustered Wound: No Photos Wound Measurements Length: (cm) 2.1 Width: (cm) 2.3 Depth: (cm) 0.1 Area: (cm) 3.793 Volume: (cm) 0.379 % Reduction in Area: 43.2% % Reduction in Volume: 43.3% Epithelialization: Small (1-33%) Tunneling: No Undermining: No Wound Description Classification: Full Thickness Without Exposed Support Structures Wound Margin: Distinct, outline attached Exudate Amount: Medium Exudate Type: Serosanguineous Exudate Color: red, brown Foul Odor After Cleansing: No Slough/Fibrino Yes Wound Bed Granulation Amount: Medium (34-66%) Exposed Structure Granulation Quality: Red, Hyper-granulation Fascia Exposed: No Necrotic Amount: Medium (34-66%) Fat Layer (Subcutaneous Tissue) Exposed: Yes Tendon Exposed: No Muscle Exposed: No Joint Exposed: No Bone Exposed: No Periwound Skin Texture Texture Color No Abnormalities Noted: Yes No Abnormalities Noted: No Rubor: Yes Moisture No Abnormalities Noted: Yes Temperature / Pain Temperature: No Abnormality Treatment Notes Wound #1 (Achilles) Wound Laterality: Right Cleanser Soap and Water Discharge Instruction: May shower and wash wound with dial antibacterial soap and water prior to dressing change. Wound Cleanser Discharge Instruction: Cleanse the wound with wound cleanser prior to applying a clean dressing using gauze sponges, not tissue or cotton balls. Peri-Wound Care Topical Primary Dressing QUENIA, NUCKOLS ([id number]) [id number] Page 9 of 12 Hydrofera Blue Ready Transfer Foam, 4x5 (in/in) Discharge Instruction: Apply to wound bed as instructed Secondary Dressing Zetuvit Plus Silicone Border Dressing 4x4 (in/in) Discharge Instruction: Apply silicone border over primary dressing as  directed. Secured With Compression Wrap Compression Stockings Environmental education officer) Signed: 04/17/2022 4:55:37 PM By: [name] RN Entered By: [name] on 04/17/2022 13:54:51 -------------------------------------------------------------------------------- Wound Assessment Details Patient Name: Date of Service: [name], [name] 04/17/2022 1:30 PM Medical Record Number: [id number] Patient Account Number: [id number] Date of Birth/Sex: Treating RN: Dec 04, 1945 (77 y.o. [name], [name] Primary Care Taisia Fantini: [name] Other Clinician: Referring Arshan Jabs: Treating Gilbert Manolis/Extender: Otelia Sergeant Weeks in Treatment: 6 Wound Status Wound Number: 2 Primary T be determined o Etiology: Wound Location: Right, Posterior Lower Leg Wound Open Wounding Event: Shear/Friction Status: Date Acquired: 04/08/2022 Notes: Pt states she started feeling a little sensation under her wrap Weeks Of Treatment: 0 and now has a new place Clustered Wound: No Comorbid Asthma, Hypertension, Received Radiation History: Photos Wound Measurements Length: (cm) 0.4 Width: (cm) 1.5 Depth: (cm) 0.1 Area: (cm) 0.471 Volume: (cm) 0.047 % Reduction in Area: 33.4% % Reduction in Volume: 33.8% Epithelialization: None Tunneling: No Undermining: No Wound Description  Classification: Full Thickness Without Exposed Support Structures Wound Margin: Distinct, outline attached Exudate Amount: Medium Exudate Type: Serosanguineous Exudate Color: red, brown Foul Odor After Cleansing: No Slough/Fibrino Yes Wound Bed IZZAH, ASANTE [name] ([id number]) [id number] Page 10 of 12 Granulation Amount: Small (1-33%) Exposed Structure Granulation Quality: Red, Pink Fascia Exposed: No Necrotic Amount: Large (67-100%) Fat Layer (Subcutaneous Tissue) Exposed: Yes Necrotic Quality: Adherent Slough Tendon Exposed: No Muscle Exposed: No Joint Exposed: No Bone Exposed:  No Periwound Skin Texture Texture Color No Abnormalities Noted: Yes No Abnormalities Noted: No Rubor: Yes Moisture No Abnormalities Noted: Yes Temperature / Pain Temperature: No Abnormality Treatment Notes Wound #2 (Lower Leg) Wound Laterality: Right, Posterior Cleanser Soap and Water Discharge Instruction: May shower and wash wound with dial antibacterial soap and water prior to dressing change. Wound Cleanser Discharge Instruction: Cleanse the wound with wound cleanser prior to applying a clean dressing using gauze sponges, not tissue or cotton balls. Peri-Wound Care Topical Primary Dressing Hydrofera Blue Ready Transfer Foam, 2.5x2.5 (in/in) Discharge Instruction: Apply directly to wound bed as directed Secondary Dressing Zetuvit Plus Silicone Border Dressing 4x4 (in/in) Discharge Instruction: Apply silicone border over primary dressing as directed. Secured With Compression Wrap Compression Stockings Environmental education officer) Signed: 04/17/2022 4:55:37 PM By: [name] RN Entered By: [name] on 04/17/2022 13:55:22 -------------------------------------------------------------------------------- Wound Assessment Details Patient Name: Date of Service: [name], ELIZET SOLLITTO 04/17/2022 1:30 PM Medical Record Number: [id number] Patient Account Number: [id number] Date of Birth/Sex: Treating RN: 11-05-1945 (77 y.o. [name] Primary Care Kehinde Bowdish: [name] Other Clinician: Referring Wesam Gearhart: Treating Malaysha Arlen/Extender: Otelia Sergeant Weeks in Treatment: 6 Wound Status Wound Number: 3 Primary Etiology: Lesion Wound Location: Right, Anterior Lower Leg Wound Status: Open Wounding Event: Other Lesion Comorbid History: Asthma, Hypertension, Received Radiation Date Acquired: 03/09/2022 Weeks Of Treatment: 0 Clustered Wound: No Photos MEGAHN, SHORTY ([id number]) [id number] Page 11 of 12 Wound Measurements Length: (cm)  1.7 Width: (cm) 1.5 Depth: (cm) 0.1 Area: (cm) 2.003 Volume: (cm) 0.2 % Reduction in Area: % Reduction in Volume: Tunneling: No Undermining: No Wound Description Classification: Full Thickness Without Exposed Support Structures Exudate Amount: Medium Exudate Type: Serous Exudate Color: amber Foul Odor After Cleansing: No Slough/Fibrino Yes Wound Bed Granulation Amount: Medium (34-66%) Exposed Structure Necrotic Amount: Medium (34-66%) Fascia Exposed: No Necrotic Quality: Adherent Slough Fat Layer (Subcutaneous Tissue) Exposed: Yes Tendon Exposed: No Muscle Exposed: No Joint Exposed: No Bone Exposed: No Periwound Skin Texture Texture Color No Abnormalities Noted: Yes No Abnormalities Noted: No Rubor: Yes Moisture No Abnormalities Noted: No Temperature / Pain Dry / Scaly: Yes Temperature: No Abnormality Tenderness on Palpation: Yes Treatment Notes Wound #3 (Lower Leg) Wound Laterality: Right, Anterior Cleanser Soap and Water Discharge Instruction: May shower and wash wound with dial antibacterial soap and water prior to dressing change. Wound Cleanser Discharge Instruction: Cleanse the wound with wound cleanser prior to applying a clean dressing using gauze sponges, not tissue or cotton balls. Peri-Wound Care Zinc Oxide Ointment 30g tube Discharge Instruction: Apply Zinc Oxide to periwound with each dressing change Sween Lotion (Moisturizing lotion) Discharge Instruction: Apply moisturizing lotion as directed Topical Primary Dressing Promogran Prisma Matrix, 4.34 (sq in) (silver collagen) Discharge Instruction: Moisten collagen with hydrogel Secondary Dressing Zetuvit Plus Silicone Border Dressing 4x4 (in/in) Discharge Instruction: Apply silicone border over primary dressing as directed. Secured With Compression BREALYNN, MCALEXANDER [name] ([id number]) [id number] Page 12 of 12 Compression Stockings Add-Ons Electronic Signature(s) Signed:  04/17/2022 4:55:37 PM By: [name] RN  Entered By: [name] on 04/17/2022 14:44:23 -------------------------------------------------------------------------------- Vitals Details Patient Name: Date of Service: Mulhall, DNAYA RAYNE 04/17/2022 1:30 PM Medical Record Number: [id number] Patient Account Number: [id number] Date of Birth/Sex: Treating RN: 1945/08/07 (77 y.o. [name], [name] Primary Care Ellyce Lafevers: [name] Other Clinician: Referring Yue Glasheen: Treating Randie Bloodgood/Extender: [name] in Treatment: 6 Vital Signs Time Taken: 13:45 Temperature (F): 98.3 Height (in): 60 Pulse (bpm): 70 Weight (lbs): 126 Respiratory Rate (breaths/min): 16 Body Mass Index (BMI): 24.6 Blood Pressure (mmHg): 148/75 Reference Range: 80 - 120 mg / dl Electronic Signature(s) Signed: 04/17/2022 4:55:37 PM By: [name] RN Entered By: [name] on 04/17/2022 13:51:15

## 2022-04-17 NOTE — Progress Notes (Signed)
Misty, [name] ([id number]) [id number] Page 1 of 11 Visit Report for 04/17/2022 Chief Complaint Document Details Patient Name: Date of Service: [name], [name] 04/17/2022 1:30 PM Medical Record Number: [id number] Patient Account Number: [id number] Date of Birth/Sex: Treating RN: 15-Jul-1945 (77 y.o. F) Primary Care Provider: [name] Other Clinician: Referring Provider: Treating Provider/Extender: [name] in Treatment: 6 Information Obtained from: Patient Chief Complaint Patient seen for complaints of Non-Healing Wound. Electronic Signature(s) Signed: 04/17/2022 2:32:57 PM By: Fredirick Maudlin MD FACS Entered By: Fredirick Maudlin on 04/17/2022 14:32:57 -------------------------------------------------------------------------------- Debridement Details Patient Name: Date of Service: [place], [name] 04/17/2022 1:30 PM Medical Record Number: [id number] Patient Account Number: [id number] Date of Birth/Sex: Treating RN: [birth year] (77 y.o. [name], [name] Primary Care Provider: [name] Other Clinician: Referring Provider: Treating Provider/Extender: [name] in Treatment: 6 Debridement Performed for Assessment: Wound #1 Right Achilles Performed By: Physician Fredirick Maudlin, MD Debridement Type: Debridement Level of Consciousness (Pre-procedure): Awake and Alert Pre-procedure Verification/Time Out Yes - 14:03 Taken: Start Time: 14:04 Pain Control: Lidocaine 5% topical ointment T Area Debrided (L x W): otal 2.1 (cm) x 2.3 (cm) = 4.83 (cm) Tissue and other material debrided: Viable, Non-Viable, Slough, Subcutaneous, Slough Level: Skin/Subcutaneous Tissue Debridement Description: Excisional Instrument: Curette Bleeding: Minimum Hemostasis Achieved: Silver Nitrate Response to Treatment: Procedure was tolerated well Level of Consciousness (Post- Awake and Alert procedure): Post Debridement Measurements of  Total Wound Length: (cm) 2.1 Width: (cm) 2.3 Depth: (cm) 0.1 Volume: (cm) 0.379 Character of Wound/Ulcer Post Debridement: Requires Further Debridement Post Procedure Diagnosis Same as Pre-procedure ZAKIA, POPHAM V ([id number]) [id number] Page 2 of 11 Notes Scribed for Dr. Celine Ahr by [name], RN Electronic Signature(s) Signed: 04/17/2022 4:34:30 PM By: Fredirick Maudlin MD FACS Signed: 04/17/2022 4:55:37 PM By: [name] RN Entered By: [name] on 04/17/2022 14:07:48 -------------------------------------------------------------------------------- Debridement Details Patient Name: Date of Service: [place], [name] 04/17/2022 1:30 PM Medical Record Number: [id number] Patient Account Number: [id number] Date of Birth/Sex: Treating RN: August 14, 1945 (77 y.o. [name], [name] Primary Care Provider: [name] Other Clinician: Referring Provider: Treating Provider/Extender: [name] in Treatment: 6 Debridement Performed for Assessment: Wound #2 Right,Posterior Lower Leg Performed By: Physician Fredirick Maudlin, MD Debridement Type: Debridement Level of Consciousness (Pre-procedure): Awake and Alert Pre-procedure Verification/Time Out Yes - 14:03 Taken: Start Time: 14:04 Pain Control: Lidocaine 5% topical ointment T Area Debrided (L x W): otal 0.4 (cm) x 1.5 (cm) = 0.6 (cm) Tissue and other material debrided: Viable, Non-Viable, Slough, Subcutaneous, Slough Level: Skin/Subcutaneous Tissue Debridement Description: Excisional Instrument: Curette Bleeding: Minimum Hemostasis Achieved: Pressure Response to Treatment: Procedure was tolerated well Level of Consciousness (Post- Awake and Alert procedure): Post Debridement Measurements of Total Wound Length: (cm) 0.4 Width: (cm) 1.5 Depth: (cm) 0.1 Volume: (cm) 0.047 Character of Wound/Ulcer Post Debridement: Requires Further Debridement Post Procedure Diagnosis Same as  Pre-procedure Notes Scribed for Dr. Celine Ahr by [name], RN Electronic Signature(s) Signed: 04/17/2022 4:34:30 PM By: Fredirick Maudlin MD FACS Signed: 04/17/2022 4:55:37 PM By: [name] RN Entered By: [name] on 04/17/2022 14:08:38 -------------------------------------------------------------------------------- Debridement Details Patient Name: Date of Service: [place], [name] 04/17/2022 1:30 PM Medical Record Number: [id number] Patient Account Number: [id number] Date of Birth/Sex: Treating RN: [birth year] (77 y.o. [name], Rollins Primary Care Provider: [name] Other Clinician: ARGUSTA, MIKKELSEN ([id number]) [id number] Page 3 of 11 Referring Provider: Treating Provider/Extender: Tonette Lederer, Ashley [place] in Treatment:  6 Debridement Performed for Assessment: Wound #3 Right,Anterior Lower Leg Performed By: Physician Fredirick Maudlin, MD Debridement Type: Debridement Level of Consciousness (Pre-procedure): Awake and Alert Pre-procedure Verification/Time Out Yes - 14:03 Taken: Start Time: 14:04 Pain Control: Lidocaine 5% topical ointment T Area Debrided (L x W): otal 1.7 (cm) x 1.5 (cm) = 2.55 (cm) Tissue and other material debrided: Viable, Non-Viable, Slough, Slough Level: Non-Viable Tissue Debridement Description: Selective/Open Wound Instrument: Curette Bleeding: Minimum Hemostasis Achieved: Silver Nitrate Response to Treatment: Procedure was tolerated well Level of Consciousness (Post- Awake and Alert procedure): Post Debridement Measurements of Total Wound Length: (cm) 1.7 Width: (cm) 1.5 Depth: (cm) 0.1 Volume: (cm) 0.2 Character of Wound/Ulcer Post Debridement: Requires Further Debridement Post Procedure Diagnosis Same as Pre-procedure Notes Scribed for Dr. Celine Ahr by [name], RN Electronic Signature(s) Signed: 04/17/2022 4:34:30 PM By: Fredirick Maudlin MD FACS Signed: 04/17/2022 4:55:37 PM By: [name]  RN Entered By: [name] on 04/17/2022 14:13:51 -------------------------------------------------------------------------------- HPI Details Patient Name: Date of Service: [name], [name] 04/17/2022 1:30 PM Medical Record Number: [id number] Patient Account Number: [id number] Date of Birth/Sex: Treating RN: [birth year] (77 y.o. F) Primary Care Provider: [name] Other Clinician: Referring Provider: Treating Provider/Extender: [name] in Treatment: 6 History of Present Illness HPI Description: ADMISSION 03/01/2022 This is a 77 year old otherwise fairly healthy woman (not diabetic, not obese, does not smoke) who presents to the clinic with an ulcer on her right posterior leg. She has a history of a liposarcoma excised in 1973. The patient is not sure but believes she received radiation therapy to the location. She did have a skin graft. About 6 months ago, she developed a small ulcer in the area that seemed to start as an abscess. She has had an open wound in that area since then. A shave biopsy was taken by a dermatologist that showed inflammation without evidence of malignancy. She has been applying Vaseline to the site along with periwound TCA. ABI in clinic today is 0.94. Due to the failure of the wound to heal properly, she has been referred to the wound care center for further evaluation and management. 03/09/2022: The tissue on the wound is protruding further from the skin surface. It does not have a typical appearance consistent with hypertrophic granulation tissue. I am concerned that it may represent malignancy. 03/17/2022: Fortunately, the biopsy that I took last week was negative for malignancy. It returned as consistent with an ulcer and inflamed granulation tissue. She still has hypertrophic granulation tissue at the site and some slough accumulation on the surface. No significant change in the wound dimensions. 03/23/2022: The wound measured smaller today  and the hypertrophic granulation tissue is less prominent. Due to the unusual shape of her leg from old surgery, however, there has been some friction from her compressive wrap. 03/31/2022: The wound is slightly smaller today. There is light slough on the surface. There is still some hypertrophic granulation tissue present. NAHDIA, PREYER ([id number]) [id number] Page 4 of 11 04/12/2022: She has a new wound near the top of her surgical defect. Where the skin rolls over, there has been some friction and abrasion that has resulted in tissue breakdown. There is slough buildup on the surface. The original wound is smaller and there is epithelialization around the borders. There is slough buildup on the surface again. She has been approved for TheraSkin but it is unclear what her financial responsibility would be at this point. We are still working on determining that.  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 04/17/2022: Both posterior leg wounds are smaller today. They both have slough accumulation. She had her Mohs surgery and there is a defect on her anterior tibial surface. I spoke with her dermatologist and we are going to assume care for this wound. There is a layer of rubbery slough on this wound and it appears a little dry. Electronic Signature(s) Signed: 04/17/2022 2:33:49 PM By: Fredirick Maudlin MD FACS Entered By: Fredirick Maudlin on 04/17/2022 14:33:49 -------------------------------------------------------------------------------- Physical Exam Details Patient Name: Date of Service: [place], Averleigh V. 04/17/2022 1:30 PM Medical Record Number: [id number] Patient Account Number: [id number] Date of Birth/Sex: Treating RN: [birth year] (77 y.o. F) Primary Care Provider: [name] Other Clinician: Referring Provider: Treating Provider/Extender: Otelia Sergeant Weeks in Treatment:  6 Constitutional Slightly hypertensive. . . . no acute distress. Respiratory Normal work of breathing on room air. Notes 04/17/2022: Both posterior leg wounds are smaller today. They both have slough accumulation. She had her Mohs surgery and there is a defect on her anterior tibial surface. There is a layer of rubbery slough on this wound and it appears a little dry. Electronic Signature(s) Signed: 04/17/2022 2:34:28 PM By: Fredirick Maudlin MD FACS Entered By: Fredirick Maudlin on 04/17/2022 14:34:28 -------------------------------------------------------------------------------- Physician Orders Details Patient Name: Date of Service: Misty [name] [name], Tenya V. 04/17/2022 1:30 PM Medical Record Number: [id number] Patient Account Number: [id number] Date of Birth/Sex: Treating RN: September 25, 1945 (77 y.o. 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 - Dr. Celine Ahr Room 3 or 4 Return A Anesthetic (In clinic) Topical Lidocaine 4% applied to wound bed Cellular or Tissue Based Products Cellular or Tissue Based Product Type: - RUN IVR for [employer] or [employer] May shower with protection but do not get wound dressing(s) wet. Protect dressing(s) with water repellant cover (for example, large plastic bag) or a cast cover and may then take shower. -  Please do not get leg wraps wet on Right Leg. May use a cast protector on right leg- can purchase from [employer]; Medical supply store; [facility] etc. the cast protector costs approximately $18-$29 Edema Control - Lymphedema / SCD / Other void standing for long periods of time. - Rest and elevate legs throughout the day. A Moisturize legs daily. - Use a lotion or moisturizer for example :Sween,Eucerin;Aquaphor etc. Wound Treatment Wound #1 - Achilles Wound Laterality: Right Cleanser: Soap and Water 1 x Per Week/30 Days Discharge Instructions: May shower and wash wound with dial antibacterial soap and water prior to dressing change. Cleanser: Wound Cleanser 1 x Per Week/30 Days Discharge Instructions: Cleanse the wound with wound cleanser prior to applying a clean dressing using gauze sponges, not tissue or cotton balls. Prim Dressing: Hydrofera Blue Ready Transfer Foam, 4x5 (in/in) 1 x Per Week/30 Days ary Discharge Instructions: Apply to wound bed as instructed Secondary Dressing: Zetuvit Plus Silicone Border Dressing 4x4 (in/in) 1 x Per  Week/30 Days Discharge Instructions: Apply silicone border over primary dressing as directed. Wound #2 - Lower Leg Wound Laterality: Right, Posterior Cleanser: Soap and Water 1 x Per Week/30 Days Discharge Instructions: May shower and wash wound with dial antibacterial soap and water prior to dressing change. Cleanser: Wound Cleanser 1 x Per Week/30 Days Discharge Instructions: Cleanse the wound with wound cleanser prior to applying a clean dressing using gauze sponges, not tissue or cotton balls. Prim Dressing: Hydrofera Blue Ready Transfer Foam, 2.5x2.5 (in/in) 1 x Per Week/30 Days ary Discharge Instructions: Apply directly to wound bed as directed Secondary Dressing: Zetuvit Plus Silicone Border Dressing 4x4 (in/in) 1 x Per Week/30 Days Discharge Instructions: Apply silicone border over primary dressing as directed. Wound #3 - Lower Leg Wound Laterality: Right,  Anterior Cleanser: Soap and Water 1 x Per Week/30 Days Discharge Instructions: May shower and wash wound with dial antibacterial soap and water prior to dressing change. Cleanser: Wound Cleanser 1 x Per Week/30 Days Discharge Instructions: Cleanse the wound with wound cleanser prior to applying a clean dressing using gauze sponges, not tissue or cotton balls. Peri-Wound Care: Zinc Oxide Ointment 30g tube 1 x Per Week/30 Days Discharge Instructions: Apply Zinc Oxide to periwound with each dressing change Peri-Wound Care: Sween Lotion (Moisturizing lotion) 1 x Per Week/30 Days Discharge Instructions: Apply moisturizing lotion as directed Prim Dressing: Promogran Prisma Matrix, 4.34 (sq in) (silver collagen) ary 1 x Per Week/30 Days Discharge Instructions: Moisten collagen with hydrogel Secondary Dressing: Zetuvit Plus Silicone Border Dressing 4x4 (in/in) 1 x Per Week/30 Days Discharge Instructions: Apply silicone border over primary dressing as directed. Electronic Signature(s) Signed: 04/17/2022 4:34:30 PM By: Fredirick Maudlin MD FACS Entered By: Fredirick Maudlin on 04/17/2022 14:34:45 [name] ([id number]) [id number] Page 6 of 11 -------------------------------------------------------------------------------- Problem List Details Patient Name: Date of Service: TORIANN, PACKER 04/17/2022 1:30 PM Medical Record Number: [id number] Patient Account Number: [id number] Date of Birth/Sex: Treating RN: 01-Nov-1945 (77 y.o. F) Primary Care Provider: [name] Other Clinician: Referring Provider: Treating Provider/Extender: Otelia Sergeant Weeks in Treatment: 6 Active Problems ICD-10 Encounter Code Description Active Date MDM Diagnosis [phone] Non-pressure chronic ulcer of other part of right lower leg with fat layer 03/01/2022 No Yes exposed L97.818 Non-pressure chronic ulcer of other part of right lower leg with other specified 04/17/2022 No  Yes severity I10 Essential (primary) hypertension 03/01/2022 No Yes Z85.831 Personal history of malignant neoplasm of soft tissue 03/01/2022 No Yes Z92.3 Personal history of irradiation 03/01/2022 No Yes Inactive Problems Resolved Problems Electronic Signature(s) Signed: 04/17/2022 2:31:58 PM By: Fredirick Maudlin MD FACS Entered By: Fredirick Maudlin on 04/17/2022 14:31:58 -------------------------------------------------------------------------------- Progress Note Details Patient Name: Date of Service: [name], Lirio V. 04/17/2022 1:30 PM Medical Record Number: [id number] Patient Account Number: [id number] Date of Birth/Sex: Treating RN: 01-24-1946 (77 y.o. F) Primary Care Provider: [name] Other Clinician: Referring Provider: Treating Provider/Extender: [name] in Treatment: 6 Subjective Chief Complaint Information obtained from Patient Patient seen for complaints of Non-Healing Wound. CAROLLE, DUELL ([id number]) [id number] Page 7 of 11 History of Present Illness (HPI) ADMISSION 03/01/2022 This is a 77 year old otherwise fairly healthy woman (not diabetic, not obese, does not smoke) who presents to the clinic with an ulcer on her right posterior leg. She has a history of a liposarcoma excised in 1973. The patient is not sure but believes she received radiation therapy to the location. She did have a skin graft. About 6 months ago, she  developed a small ulcer in the area that seemed to start as an abscess. She has had an open wound in that area since then. A shave biopsy was taken by a dermatologist that showed inflammation without evidence of malignancy. She has been applying Vaseline to the site along with periwound TCA. ABI in clinic today is 0.94. Due to the failure of the wound to heal properly, she has been referred to the wound care center for further evaluation and management. 03/09/2022: The tissue on the wound is protruding  further from the skin surface. It does not have a typical appearance consistent with hypertrophic granulation tissue. I am concerned that it may represent malignancy. 03/17/2022: Fortunately, the biopsy that I took last week was negative for malignancy. It returned as consistent with an ulcer and inflamed granulation tissue. She still has hypertrophic granulation tissue at the site and some slough accumulation on the surface. No significant change in the wound dimensions. 03/23/2022: The wound measured smaller today and the hypertrophic granulation tissue is less prominent. Due to the unusual shape of her leg from old surgery, however, there has been some friction from her compressive wrap. 03/31/2022: The wound is slightly smaller today. There is light slough on the surface. There is still some hypertrophic granulation tissue present. 04/12/2022: She has a new wound near the top of her surgical defect. Where the skin rolls over, there has been some friction and abrasion that has resulted in tissue breakdown. There is slough buildup on the surface. The original wound is smaller and there is epithelialization around the borders. There is slough buildup on the surface again. She has been approved for TheraSkin but it is unclear what her financial responsibility would be at this point. We are still working on determining that. 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 04/17/2022: Both posterior leg wounds are smaller today. They both have slough accumulation. She had her Mohs surgery and there is a defect on her anterior tibial surface. I spoke with her dermatologist and we are going to assume care for this wound. There is a layer of rubbery slough on this wound and it appears a little dry. Patient History Information obtained from Patient. Family History Unknown History. Social History Never smoker, Marital Status - Married, Alcohol Use -  Never, Drug Use - No History, Caffeine Use - Never. Medical History Respiratory Patient has history of Asthma - Childhood asthma Cardiovascular Patient has history of Hypertension Oncologic Patient has history of Received Radiation - R/T Liposarcoma in 1972 Hospitalization/Surgery History - Left Breast Lumpectomy;Tonsillectomy ; Squamous cell carcinoma; Hysterectomy;Right Leg liposarcoma. Medical A Surgical History Notes nd Eyes Pt. states she has pre-glaucoma Cardiovascular Hx: Angio-edema r/t Bee venom; Hyperlipidemia Endocrine Hx: hypothyroidism Immunological Hx: Currently being treated with Immunotherapy (r/t Bee-Venom) Integumentary (Skin) Hx: Urticaria Musculoskeletal Hx: Osteoporosis Oncologic 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)-Biopsy taken to rule out squamous cell carcinoma on right arm and right leg (02/24/22), still pending results Objective Constitutional Slightly hypertensive. no acute distress. Vitals Time Taken: 1:45 PM, Height: 60 in, Weight: 126 lbs, BMI: 24.6, Temperature: 98.3 F, Pulse: 70 bpm, Respiratory Rate: 16 breaths/min, Blood Pressure: 148/75 mmHg. Respiratory Normal work of breathing on room air. General Notes: 04/17/2022: Both posterior leg wounds are smaller today. They both have slough accumulation. She had her Mohs surgery and there is a defect DAVEENA, KRELLER ([id number]) [id number] Page 8 of 11 on her anterior tibial surface.  There is a layer of rubbery slough on this wound and it appears a little dry. Integumentary (Hair, Skin) Wound #1 status is Open. Original cause of wound was Gradually Appeared. The date acquired was: 08/30/2021. The wound has been in treatment 6 weeks. The wound is located on the Right Achilles. The wound measures 2.1cm length x 2.3cm width x 0.1cm depth; 3.793cm^2 area and 0.379cm^3 volume. There is Fat Layer (Subcutaneous Tissue) exposed. There is no tunneling  or undermining noted. There is a medium amount of serosanguineous drainage noted. The wound margin is distinct with the outline attached to the wound base. There is medium (34-66%) red, hyper - granulation within the wound bed. There is a medium (34- 66%) amount of necrotic tissue within the wound bed. The periwound skin appearance had no abnormalities noted for texture. The periwound skin appearance had no abnormalities noted for moisture. The periwound skin appearance exhibited: Rubor. Periwound temperature was noted as No Abnormality. Wound #2 status is Open. Original cause of wound was Shear/Friction. The date acquired was: 04/08/2022. The wound is located on the Right,Posterior Lower Leg. The wound measures 0.4cm length x 1.5cm width x 0.1cm depth; 0.471cm^2 area and 0.047cm^3 volume. There is Fat Layer (Subcutaneous Tissue) exposed. There is no tunneling or undermining noted. There is a medium amount of serosanguineous drainage noted. The wound margin is distinct with the outline attached to the wound base. There is small (1-33%) red, pink granulation within the wound bed. There is a large (67-100%) amount of necrotic tissue within the wound bed including Adherent Slough. The periwound skin appearance had no abnormalities noted for texture. The periwound skin appearance had no abnormalities noted for moisture. The periwound skin appearance exhibited: Rubor. Periwound temperature was noted as No Abnormality. Wound #3 status is Open. Original cause of wound was Other Lesion. The date acquired was: 03/09/2022. The wound is located on the Right,Anterior Lower Leg. The wound measures 1.7cm length x 1.5cm width x 0.1cm depth; 2.003cm^2 area and 0.2cm^3 volume. There is Fat Layer (Subcutaneous Tissue) exposed. There is no tunneling or undermining noted. There is a medium amount of serous drainage noted. There is medium (34-66%) granulation within the wound bed. There is a medium (34-66%) amount of necrotic  tissue within the wound bed including Adherent Slough. The periwound skin appearance had no abnormalities noted for texture. The periwound skin appearance exhibited: Dry/Scaly, Rubor. Periwound temperature was noted as No Abnormality. The periwound has tenderness on palpation. Assessment Active Problems ICD-10 Non-pressure chronic ulcer of other part of right lower leg with fat layer exposed Non-pressure chronic ulcer of other part of right lower leg with other specified severity Essential (primary) hypertension Personal history of malignant neoplasm of soft tissue Personal history of irradiation Procedures Wound #1 Pre-procedure diagnosis of Wound #1 is an Abscess located on the Right Achilles . There was a Excisional Skin/Subcutaneous Tissue Debridement with a total area of 4.83 sq cm performed by Fredirick Maudlin, MD. With the following instrument(s): Curette to remove Viable and Non-Viable tissue/material. Material removed includes Subcutaneous Tissue and Slough and after achieving pain control using Lidocaine 5% topical ointment. No specimens were taken. A time out was conducted at 14:03, prior to the start of the procedure. A Minimum amount of bleeding was controlled with Silver Nitrate. The procedure was tolerated well. Post Debridement Measurements: 2.1cm length x 2.3cm width x 0.1cm depth; 0.379cm^3 volume. Character of Wound/Ulcer Post Debridement requires further debridement. Post procedure Diagnosis Wound #1: Same as Pre-Procedure General Notes: Scribed for  Dr. Celine Ahr by [name], RN. Wound #2 Pre-procedure diagnosis of Wound #2 is a T be determined located on the Right,Posterior Lower Leg . There was a Excisional Skin/Subcutaneous Tissue o Debridement with a total area of 0.6 sq cm performed by Fredirick Maudlin, MD. With the following instrument(s): Curette to remove Viable and Non-Viable tissue/material. Material removed includes Subcutaneous Tissue and Slough and after  achieving pain control using Lidocaine 5% topical ointment. No specimens were taken. A time out was conducted at 14:03, prior to the start of the procedure. A Minimum amount of bleeding was controlled with Pressure. The procedure was tolerated well. Post Debridement Measurements: 0.4cm length x 1.5cm width x 0.1cm depth; 0.047cm^3 volume. Character of Wound/Ulcer Post Debridement requires further debridement. Post procedure Diagnosis Wound #2: Same as Pre-Procedure General Notes: Scribed for Dr. Celine Ahr by [name], RN. Wound #3 Pre-procedure diagnosis of Wound #3 is a Lesion located on the Right,Anterior Lower Leg . There was a Selective/Open Wound Non-Viable Tissue Debridement with a total area of 2.55 sq cm performed by Fredirick Maudlin, MD. With the following instrument(s): Curette to remove Viable and Non-Viable tissue/material. Material removed includes [facility] after achieving pain control using Lidocaine 5% topical ointment. No specimens were taken. A time out was conducted at 14:03, prior to the start of the procedure. A Minimum amount of bleeding was controlled with Silver Nitrate. The procedure was tolerated well. Post Debridement Measurements: 1.7cm length x 1.5cm width x 0.1cm depth; 0.2cm^3 volume. Character of Wound/Ulcer Post Debridement requires further debridement. Post procedure Diagnosis Wound #3: Same as Pre-Procedure General Notes: Scribed for Dr. Celine Ahr by [name], RN. Plan Follow-up Appointments: Return Appointment in 1 week. - Dr. Celine Ahr Room 3 or 4 Anesthetic: (In clinic) Topical Lidocaine 4% applied to wound bed Cellular or Tissue Based Products: Cellular or Tissue Based Product Type: - RUN IVR for [employer] or [employer]: SACRED, POWIS ([id number]) [id number] Page 9 of 11 May shower with protection but do not get wound dressing(s) wet. Protect dressing(s) with water repellant cover (for example, large plastic  bag) or a cast cover and may then take shower. - Please do not get leg wraps wet on Right Leg. May use a cast protector on right leg- can purchase from [employer]; Medical supply store; [facility] etc. the cast protector costs approximately $18-$29 Edema Control - Lymphedema / SCD / Other: Avoid standing for long periods of time. - Rest and elevate legs throughout the day. Moisturize legs daily. - Use a lotion or moisturizer for example :Sween,Eucerin;Aquaphor etc. WOUND #1: - Achilles Wound Laterality: Right Cleanser: Soap and Water 1 x Per Week/30 Days Discharge Instructions: May shower and wash wound with dial antibacterial soap and water prior to dressing change. Cleanser: Wound Cleanser 1 x Per Week/30 Days Discharge Instructions: Cleanse the wound with wound cleanser prior to applying a clean dressing using gauze sponges, not tissue or cotton balls. Prim Dressing: Hydrofera Blue Ready Transfer Foam, 4x5 (in/in) 1 x Per Week/30 Days ary Discharge Instructions: Apply to wound bed as instructed Secondary Dressing: Zetuvit Plus Silicone Border Dressing 4x4 (in/in) 1 x Per Week/30 Days Discharge Instructions: Apply silicone border over primary dressing as directed. WOUND #2: - Lower Leg Wound Laterality: Right, Posterior Cleanser: Soap and Water 1 x Per Week/30 Days Discharge Instructions: May shower and wash wound with dial antibacterial soap and water prior to dressing change. Cleanser: Wound Cleanser 1 x Per Week/30 Days Discharge Instructions: Cleanse the wound with wound cleanser prior to  applying a clean dressing using gauze sponges, not tissue or cotton balls. Prim Dressing: Hydrofera Blue Ready Transfer Foam, 2.5x2.5 (in/in) 1 x Per Week/30 Days ary Discharge Instructions: Apply directly to wound bed as directed Secondary Dressing: Zetuvit Plus Silicone Border Dressing 4x4 (in/in) 1 x Per Week/30 Days Discharge Instructions: Apply silicone border over primary dressing as directed. WOUND #3: -  Lower Leg Wound Laterality: Right, Anterior Cleanser: Soap and Water 1 x Per Week/30 Days Discharge Instructions: May shower and wash wound with dial antibacterial soap and water prior to dressing change. Cleanser: Wound Cleanser 1 x Per Week/30 Days Discharge Instructions: Cleanse the wound with wound cleanser prior to applying a clean dressing using gauze sponges, not tissue or cotton balls. Peri-Wound Care: Zinc Oxide Ointment 30g tube 1 x Per Week/30 Days Discharge Instructions: Apply Zinc Oxide to periwound with each dressing change Peri-Wound Care: Sween Lotion (Moisturizing lotion) 1 x Per Week/30 Days Discharge Instructions: Apply moisturizing lotion as directed Prim Dressing: Promogran Prisma Matrix, 4.34 (sq in) (silver collagen) 1 x Per Week/30 Days ary Discharge Instructions: Moisten collagen with hydrogel Secondary Dressing: Zetuvit Plus Silicone Border Dressing 4x4 (in/in) 1 x Per Week/30 Days Discharge Instructions: Apply silicone border over primary dressing as directed. 04/17/2022: Both posterior leg wounds are smaller today. They both have slough accumulation. She had her Mohs surgery and there is a defect on her anterior tibial surface. I spoke with her dermatologist and we are going to assume care for this wound. There is a layer of rubbery slough on this wound and it appears a little dry. I used a curette to debride slough from all of the wound surfaces. I then chemically cauterized the hypertrophic granulation tissue on her posterior calf wound with silver nitrate. We will continue Hydrofera Blue to both posterior wounds. We are going to use Prisma silver collagen moistened with hydrogel for the anterior tibial Mohs defect. Continue 3 layer compression. Follow-up in 1 week. Electronic Signature(s) Signed: 04/17/2022 2:35:43 PM By: Fredirick Maudlin MD FACS Entered By: Fredirick Maudlin on 04/17/2022  14:35:42 -------------------------------------------------------------------------------- HxROS Details Patient Name: Date of Service: Palmer, Sandra V. 04/17/2022 1:30 PM Medical Record Number: [id number] Patient Account Number: [id number] Date of Birth/Sex: Treating RN: 1945/09/11 (77 y.o. 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 Respiratory Medical History: MENA, BORGMAN ([id number]) [id number] Page 10 of 11 Positive for: Asthma - Childhood asthma Cardiovascular Medical History: Positive for: Hypertension Past Medical History Notes: Hx: Angio-edema r/t Bee venom; Hyperlipidemia Endocrine Medical History: Past Medical History Notes: Hx: hypothyroidism Immunological Medical History: Past Medical History Notes: Hx: Currently being treated with Immunotherapy (r/t Bee-Venom) Integumentary (Skin) Medical History: Past Medical History Notes: Hx: Urticaria Musculoskeletal Medical History: Past Medical History Notes: Hx: Osteoporosis Oncologic Medical History: Positive for: Received Radiation - R/T Liposarcoma in 1972 Past Medical History Notes: 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)-Biopsy taken to rule out squamous cell carcinoma on right arm and right leg (02/24/22), still pending results Immunizations Pneumococcal Vaccine: Received Pneumococcal Vaccination: Yes Received Pneumococcal Vaccination On or After 60th Birthday: Yes Implantable Devices No devices added Hospitalization / Surgery History Type of Hospitalization/Surgery Left Breast Lumpectomy;Tonsillectomy ; Squamous cell carcinoma; Hysterectomy;Right Leg liposarcoma Family and Social History Unknown History: Yes; Never smoker;  Marital Status - Married; Alcohol Use: Never; Drug Use: No History;  Caffeine Use: Never; Financial Concerns: No; Food, Clothing or Shelter Needs: No; Support System Lacking: No; Transportation Concerns: No Electronic Signature(s) Signed: 04/17/2022 4:34:30 PM By: Fredirick Maudlin MD FACS Entered By: Fredirick Maudlin on 04/17/2022 14:33:54 -------------------------------------------------------------------------------- SuperBill Details Patient Name: Date of Service: Misty [name] [name], THALIA GOOLEY 04/17/2022 Medical Record Number: [id number] Patient Account Number: [id number] Date of Birth/Sex: Treating RN: 08-20-1945 (77 y.o. F) Primary Care Provider: [name] Other Clinician: Referring Provider: Treating Provider/Extender: Otelia Sergeant [place], [name] ([id number]) [phone].pdf Page 11 of 11 Weeks in Treatment: 6 Diagnosis Coding ICD-10 Codes Code Description [phone] Non-pressure chronic ulcer of other part of right lower leg with fat layer exposed L97.818 Non-pressure chronic ulcer of other part of right lower leg with other specified severity I10 Essential (primary) hypertension Z85.831 Personal history of malignant neoplasm of soft tissue Z92.3 Personal history of irradiation Facility Procedures : CPT4 Code: [id number] Description: [id number] - DEB SUBQ TISSUE 20 SQ CM/< ICD-10 Diagnosis Description [id number] Non-pressure chronic ulcer of other part of right lower leg with fat layer exposed Modifier: Quantity: 1 : CPT4 Code: [id number] Description: [id number] - DEBRIDE WOUND 1ST 20 SQ CM OR < ICD-10 Diagnosis Description L97.818 Non-pressure chronic ulcer of other part of right lower leg with other specified s Modifier: everity Quantity: 1 Physician Procedures : CPT4 Code Description Modifier [id number] - WC PHYS LEVEL 3 - EST PT ICD-10 Diagnosis Description [id number] Non-pressure chronic ulcer of other part of right lower leg with fat layer exposed L97.818 Non-pressure  chronic ulcer of other part of right  lower leg with other specified severity Z85.831 Personal history of malignant neoplasm of soft tissue Z92.3 Personal history of irradiation Quantity: 1 : [id number] - WC PHYS SUBQ TISS 20 SQ CM ICD-10 Diagnosis Description [id number] Non-pressure chronic ulcer of other part of right lower leg with fat layer exposed Quantity: 1 : [id number] - WC PHYS DEBR WO ANESTH 20 SQ CM ICD-10 Diagnosis Description L97.818 Non-pressure chronic ulcer of other part of right lower leg with other specified severity Quantity: 1 Electronic Signature(s) Signed: 04/17/2022 2:37:10 PM By: Fredirick Maudlin MD FACS Entered By: Fredirick Maudlin on 04/17/2022 14:37:10

## 2022-04-18 DIAGNOSIS — T8149XA Infection following a procedure, other surgical site, initial encounter: Secondary | ICD-10-CM | POA: Diagnosis not present

## 2022-04-19 DIAGNOSIS — H2513 Age-related nuclear cataract, bilateral: Secondary | ICD-10-CM | POA: Diagnosis not present

## 2022-04-19 DIAGNOSIS — H40023 Open angle with borderline findings, high risk, bilateral: Secondary | ICD-10-CM | POA: Diagnosis not present

## 2022-04-19 DIAGNOSIS — H04123 Dry eye syndrome of bilateral lacrimal glands: Secondary | ICD-10-CM | POA: Diagnosis not present

## 2022-04-24 ENCOUNTER — Encounter (HOSPITAL_BASED_OUTPATIENT_CLINIC_OR_DEPARTMENT_OTHER): Payer: Medicare HMO | Admitting: General Surgery

## 2022-04-24 DIAGNOSIS — I10 Essential (primary) hypertension: Secondary | ICD-10-CM | POA: Diagnosis not present

## 2022-04-24 DIAGNOSIS — Z923 Personal history of irradiation: Secondary | ICD-10-CM | POA: Diagnosis not present

## 2022-04-24 DIAGNOSIS — L97818 Non-pressure chronic ulcer of other part of right lower leg with other specified severity: Secondary | ICD-10-CM | POA: Diagnosis not present

## 2022-04-24 DIAGNOSIS — L97812 Non-pressure chronic ulcer of other part of right lower leg with fat layer exposed: Secondary | ICD-10-CM | POA: Diagnosis not present

## 2022-04-24 DIAGNOSIS — L02415 Cutaneous abscess of right lower limb: Secondary | ICD-10-CM | POA: Diagnosis not present

## 2022-04-24 DIAGNOSIS — Z85831 Personal history of malignant neoplasm of soft tissue: Secondary | ICD-10-CM | POA: Diagnosis not present

## 2022-04-24 DIAGNOSIS — L988 Other specified disorders of the skin and subcutaneous tissue: Secondary | ICD-10-CM | POA: Diagnosis not present

## 2022-04-26 DIAGNOSIS — Z5189 Encounter for other specified aftercare: Secondary | ICD-10-CM | POA: Diagnosis not present

## 2022-04-27 NOTE — Progress Notes (Signed)
[name] ([id number]) [id number] Page 1 of 12 Visit Report for 04/24/2022 Arrival Information Details Patient Name: Date of Service: [name], [name] 04/24/2022 2:15 PM Medical Record Number: [id number] Patient Account Number: [id number] Date of Birth/Sex: Treating RN: 01-11-1946 (77 y.o. [name], [name] Primary Care Shakaria Raphael: [name] Other Clinician: Referring Orie Baxendale: Treating [place] Throckmorton/Extender: [name] in Treatment: 7 Visit Information History Since Last Visit Added or deleted any medications: No Patient Arrived: Ambulatory Any new allergies or adverse reactions: No Arrival Time: 14:18 Had a fall or experienced change in No Accompanied By: SPOUSE activities of daily living that may affect Transfer Assistance: None risk of falls: Patient Identification Verified: Yes Signs or symptoms of abuse/neglect since last visito No Secondary Verification Process Completed: Yes Hospitalized since last visit: No Patient Requires Transmission-Based Precautions: No Implantable device outside of the clinic excluding No cellular tissue based products placed in the center since last visit: Has Compression in Place as Prescribed: Yes Pain Present Now: Yes Electronic Signature(s) Signed: 04/26/2022 4:23:23 PM By: [name] RN Entered By: [name] on 04/24/2022 14:18:44 -------------------------------------------------------------------------------- Compression Therapy Details Patient Name: Date of Service: [name], [name] 04/24/2022 2:15 PM Medical Record Number: [id number] Patient Account Number: [id number] Date of Birth/Sex: Treating RN: [birth year] (77 y.o. [name] Primary Care Edit Ricciardelli: [name] Other Clinician: Referring Edin Kon: Treating Arzu Mcgaughey/Extender: [name] in Treatment: 7 Compression Therapy Performed for Wound Assessment: Wound #1 Right Achilles Performed By: Clinician [name], RN Compression Type: Three Layer Post Procedure Diagnosis Same as Pre-procedure Electronic Signature(s) Signed: 04/26/2022 4:23:23 PM By: [name] RN Entered By: [name] on 04/24/2022 14:51:48 [name] ([id number]) [id number] Page 2 of 12 -------------------------------------------------------------------------------- Compression Therapy Details Patient Name: Date of Service: [name] 04/24/2022 2:15 PM Medical Record Number: [id number] Patient Account Number: [id number] Date of Birth/Sex: Treating RN: [birth year] (77 y.o. [name] Primary Care Amadeo Coke: [name] Other Clinician: Referring Alarik Radu: Treating Sabriah Hobbins/Extender: [name] in Treatment: 7 Compression Therapy Performed for Wound Assessment: Wound #3 Right,Anterior Lower Leg Performed By: Clinician [name], RN Compression Type: Three Layer Post Procedure Diagnosis Same as Pre-procedure Electronic Signature(s) Signed: 04/26/2022 4:23:23 PM By: [name] RN Entered By: [name] on 04/24/2022 14:51:48 -------------------------------------------------------------------------------- Compression Therapy Details Patient Name: Date of Service: [name], Misty [name] 04/24/2022 2:15 PM Medical Record Number: [id number] Patient Account Number: [id number] Date of Birth/Sex: Treating RN: 1945/12/01 (76 y.o. [name] Primary Care Woodard Perrell: [name] Other Clinician: Referring [name]: Treating Shailah Gibbins/Extender: [name] in Treatment: 7 Compression Therapy Performed for Wound Assessment: Wound #2 Right,Posterior Lower Leg Performed By: Clinician [name], RN Compression Type: Three Layer Post Procedure Diagnosis Same as Pre-procedure Electronic Signature(s) Signed: 04/26/2022 4:23:23 PM By: [name] RN Entered By: [name] on 04/24/2022  14:51:49 -------------------------------------------------------------------------------- Encounter Discharge Information Details Patient Name: Date of Service: [name], [name] 04/24/2022 2:15 PM Medical Record Number: [id number] Patient Account Number: [id number] Date of Birth/Sex: Treating RN: April 15, 1945 (77 y.o. [name] Primary Care Juliette Standre: [name] Other Clinician: Referring Eline Geng: Treating Emalina Dubreuil/Extender: [name] in Treatment: 7 Encounter Discharge Information Items Post Procedure Vitals Discharge Condition: Stable Temperature (F): 98.3 Ambulatory Status: Ambulatory Pulse (bpm): 65 Discharge Destination: Home Respiratory Rate (breaths/min): 18 Transportation: Private Auto Blood Pressure (mmHg): 169/79 Accompanied By: spouse Schedule Follow-up Appointment: Yes Clinical Summary of Care: [name]  [name] ([id number] Page 3 of 12 Electronic Signature(s) Signed: 04/26/2022 4:23:23 PM By: [name] RN Entered By: [name] on 04/24/2022 14:53:28 -------------------------------------------------------------------------------- Lower Extremity Assessment Details Patient Name: Date of Service: [name] 04/24/2022 2:15 PM Medical Record Number: [id number] Patient Account Number: [id number] Date of Birth/Sex: Treating RN: 07/28/1945 (77 y.o. [name] Primary Care Shaneque Merkle: [name] Other Clinician: Referring Kellyjo Edgren: Treating Chirstine Defrain/Extender: Otelia Sergeant Weeks in Treatment: 7 Edema Assessment Assessed: [Left: No] [Right: No] [Left: Edema] [Right: :] Calf Left: Right: Point of Measurement: 33 cm From Medial Instep 34 cm Ankle Left: Right: Point of Measurement: 9 cm From Medial Instep 19.5 cm Electronic Signature(s) Signed: 04/26/2022 4:23:23 PM By: [name] RN Entered By: [name] on 04/24/2022  14:30:35 -------------------------------------------------------------------------------- Multi Wound Chart Details Patient Name: Date of Service: [name], [name] 04/24/2022 2:15 PM Medical Record Number: [id number] Patient Account Number: [id number] Date of Birth/Sex: Treating RN: 1945-08-23 (77 y.o. F) Primary Care Tamatha Gadbois: [name] Other Clinician: Referring Miyoko Hashimi: Treating Ellieanna Funderburg/Extender: [name] in Treatment: 7 Vital Signs Height(in): 60 Pulse(bpm): 65 Weight(lbs): 126 Blood Pressure(mmHg): 169/79 Body Mass Index(BMI): 24.6 Temperature(F): 98.3 Respiratory Rate(breaths/min): 18 [1:Photos:] [[id number] Page 4 of 12] Right Achilles Right, Posterior Lower Leg Right, Anterior Lower Leg Wound Location: Gradually Appeared Shear/Friction Other Lesion Wounding Event: Abscess T be determined o Lesion Primary Etiology: Asthma, Hypertension, Received Asthma, Hypertension, Received Asthma, Hypertension, Received Comorbid History: Radiation Radiation Radiation 08/30/2021 04/08/2022 03/09/2022 Date Acquired: 7 1 1 $ Weeks of Treatment: Open Open Open Wound Status: No No No Wound Recurrence: 2.1x2.4x0.1 0.8x1.6x0.1 1.7x1.5x0.1 Measurements L x W x D (cm) 3.958 1.005 2.003 A (cm) : rea 0.396 0.101 0.2 Volume (cm) : 40.70% -42.10% 0.00% % Reduction in A rea: 40.70% -42.30% 0.00% % Reduction in Volume: Full Thickness Without Exposed Full Thickness Without Exposed Full Thickness Without Exposed Classification: Support Structures Support Structures Support Structures Medium Medium Medium Exudate A mount: Serosanguineous Serosanguineous Serous Exudate Type: red, brown red, brown amber Exudate Color: Distinct, outline attached Distinct, outline attached N/A Wound Margin: Medium (34-66%) Small (1-33%) Medium (34-66%) Granulation A mount: Red, Hyper-granulation Red, Pink N/A Granulation Quality: Medium (34-66%)  Large (67-100%) Medium (34-66%) Necrotic A mount: Fat Layer (Subcutaneous Tissue): Yes Fat Layer (Subcutaneous Tissue): Yes Fat Layer (Subcutaneous Tissue): Yes Exposed Structures: Fascia: No Fascia: No Fascia: No Tendon: No Tendon: No Tendon: No Muscle: No Muscle: No Muscle: No Joint: No Joint: No Joint: No Bone: No Bone: No Bone: No Small (1-33%) None Small (1-33%) Epithelialization: Debridement - Selective/Open Wound Debridement - Selective/Open Wound Debridement - Selective/Open Wound Debridement: Pre-procedure Verification/Time Out 14:43 14:43 14:43 Taken: N/A Lidocaine 4% Topical Solution N/A Pain Control: [facility] Tissue Debrided: Non-Viable Tissue Non-Viable Tissue Non-Viable Tissue Level: 5.04 1.28 2.55 Debridement A (sq cm): rea Curette Curette Curette Instrument: Minimum Minimum Minimum Bleeding: Pressure Pressure Pressure Hemostasis A chieved: Procedure was tolerated well Procedure was tolerated well Procedure was tolerated well Debridement Treatment Response: 2.1x2.4x0.1 0.8x1.6x0.1 1.7x1.5x0.1 Post Debridement Measurements L x W x D (cm) 0.396 0.101 0.2 Post Debridement Volume: (cm) Scarring: Yes No Abnormalities Noted No Abnormalities Noted Periwound Skin Texture: No Abnormalities Noted No Abnormalities Noted Dry/Scaly: Yes Periwound Skin Moisture: Rubor: Yes Rubor: Yes Rubor: Yes Periwound Skin Color: No Abnormality No Abnormality No Abnormality Temperature: N/A N/A Yes Tenderness on Palpation: Compression Therapy Compression Therapy Compression Therapy Procedures Performed: Debridement Debridement Debridement Treatment Notes Wound #1 (Achilles) Wound Laterality: Right Cleanser  Soap and Water Discharge Instruction: May shower and wash wound with dial antibacterial soap and water prior to dressing change. Wound Cleanser Discharge Instruction: Cleanse the wound with wound cleanser prior to applying a clean dressing using  gauze sponges, not tissue or cotton balls. Peri-Wound Care Topical Primary Dressing Hydrofera Blue Ready Transfer Foam, 4x5 (in/in) Discharge Instruction: Apply to wound bed as instructed Secondary Dressing Zetuvit Plus Silicone Border Dressing 4x4 (in/in) Discharge Instruction: Apply silicone border over primary dressing as directed. Secured With Compression Wrap Compression Stockings Add-Ons JOSEPHINA, HATTAR ([id number]) [id number] Page 5 of 12 Wound #2 (Lower Leg) Wound Laterality: Right, Posterior Cleanser Soap and Water Discharge Instruction: May shower and wash wound with dial antibacterial soap and water prior to dressing change. Wound Cleanser Discharge Instruction: Cleanse the wound with wound cleanser prior to applying a clean dressing using gauze sponges, not tissue or cotton balls. Peri-Wound Care Topical Primary Dressing Hydrofera Blue Ready Transfer Foam, 2.5x2.5 (in/in) Discharge Instruction: Apply directly to wound bed as directed Secondary Dressing Zetuvit Plus Silicone Border Dressing 4x4 (in/in) Discharge Instruction: Apply silicone border over primary dressing as directed. Secured With Compression Wrap Compression Stockings Add-Ons Wound #3 (Lower Leg) Wound Laterality: Right, Anterior Cleanser Soap and Water Discharge Instruction: May shower and wash wound with dial antibacterial soap and water prior to dressing change. Wound Cleanser Discharge Instruction: Cleanse the wound with wound cleanser prior to applying a clean dressing using gauze sponges, not tissue or cotton balls. Peri-Wound Care Zinc Oxide Ointment 30g tube Discharge Instruction: Apply Zinc Oxide to periwound with each dressing change Sween Lotion (Moisturizing lotion) Discharge Instruction: Apply moisturizing lotion as directed Topical Primary Dressing Promogran Prisma Matrix, 4.34 (sq in) (silver collagen) Discharge Instruction: Moisten collagen with  hydrogel Secondary Dressing Zetuvit Plus Silicone Border Dressing 4x4 (in/in) Discharge Instruction: Apply silicone border over primary dressing as directed. Secured With Compression Wrap Compression Stockings Environmental education officer) Signed: 04/24/2022 3:59:26 PM By: Fredirick Maudlin MD FACS Entered By: Fredirick Maudlin on 04/24/2022 15:59:26 -------------------------------------------------------------------------------- Multi-Disciplinary Care Plan Details Patient Name: Date of Service: [place], Idania [name]. 04/24/2022 2:15 PM [name] ([id number]) [id number] Page 6 of 12 Medical Record Number: [id number] Patient Account Number: [id number] Date of Birth/Sex: Treating RN: [birth year] (77 y.o. [name] Primary Care Nohlan Burdin: [name] Other Clinician: Referring Loetta Connelley: Treating Callyn Severtson/Extender: [name] in Treatment: 7 Active Inactive Wound/Skin Impairment Nursing Diagnoses: Knowledge deficit related to ulceration/compromised skin integrity Goals: Patient/caregiver will verbalize understanding of skin care regimen Date Initiated: 03/01/2022 Target Resolution Date: 06/02/2022 Goal Status: Active Interventions: Assess ulceration(s) every visit Treatment Activities: Skin care regimen initiated : 03/01/2022 Notes: Electronic Signature(s) Signed: 04/26/2022 4:23:23 PM By: [name] RN Entered By: [name] on 04/24/2022 14:43:56 -------------------------------------------------------------------------------- Pain Assessment Details Patient Name: Date of Service: [name], Misty CARRO 04/24/2022 2:15 PM Medical Record Number: [id number] Patient Account Number: [id number] Date of Birth/Sex: Treating RN: [birth year] (77 y.o. [name] Primary Care Elija Mccamish: [name] Other Clinician: Referring Shaye Elling: Treating Galo Sayed/Extender: [name] in Treatment: 7 Active Problems Location of  Pain Severity and Description of Pain Patient Has Paino Yes Site Locations Pain Location: Pain in Ulcers Rate the pain. Current Pain Level: 3 Pain Management and Medication Current Pain Management: DAENERYS, WISOR ([id number]) [id number].pdf Page 7 of 12 Electronic Signature(s) Signed: 04/26/2022 4:23:23 PM By: [name] RN Entered By: [name] on 04/24/2022 14:19:11 -------------------------------------------------------------------------------- Patient/Caregiver Education Details Patient Name: Date of Service: Misty [name]  [name], Misty TOOT 2/19/2024andnbsp2:15 PM Medical Record Number: [id number] Patient Account Number: [id number] Date of Birth/Gender: Treating RN: Jan 10, 1946 (77 y.o. [name] Primary Care Physician: [name] Other Clinician: Referring Physician: Treating Physician/Extender: [name] in Treatment: 7 Education Assessment Education Provided To: Patient Education Topics Provided Wound Debridement: Methods: Explain/Verbal Responses: Reinforcements needed, State content correctly Wound/Skin Impairment: Methods: Explain/Verbal Responses: State content correctly Electronic Signature(s) Signed: 04/26/2022 4:23:23 PM By: [name] RN Entered By: [name] on 04/24/2022 14:52:10 -------------------------------------------------------------------------------- Wound Assessment Details Patient Name: Date of Service: [name], Misty BRINKMEYER 04/24/2022 2:15 PM Medical Record Number: [id number] Patient Account Number: [id number] Date of Birth/Sex: Treating RN: 1946-01-16 (77 y.o. [name] Primary Care Dangelo Guzzetta: [name] Other Clinician: Referring Erina Hamme: Treating Breya Cass/Extender: Otelia Sergeant Weeks in Treatment: 7 Wound Status Wound Number: 1 Primary Etiology: Abscess Wound Location: Right Achilles Wound Status: Open Wounding Event: Gradually Appeared Comorbid History: Asthma, Hypertension,  Received Radiation Date Acquired: 08/30/2021 Weeks Of Treatment: 7 Clustered Wound: No Photos DALLYN, BRUNN ([id number]) [id number].pdf Page 8 of 12 Wound Measurements Length: (cm) 2.1 Width: (cm) 2.4 Depth: (cm) 0.1 Area: (cm) 3.958 Volume: (cm) 0.396 % Reduction in Area: 40.7% % Reduction in Volume: 40.7% Epithelialization: Small (1-33%) Tunneling: No Undermining: No Wound Description Classification: Full Thickness Without Exposed Support Structures Wound Margin: Distinct, outline attached Exudate Amount: Medium Exudate Type: Serosanguineous Exudate Color: red, brown Foul Odor After Cleansing: No Slough/Fibrino Yes Wound Bed Granulation Amount: Medium (34-66%) Exposed Structure Granulation Quality: Red, Hyper-granulation Fascia Exposed: No Necrotic Amount: Medium (34-66%) Fat Layer (Subcutaneous Tissue) Exposed: Yes Tendon Exposed: No Muscle Exposed: No Joint Exposed: No Bone Exposed: No Periwound Skin Texture Texture Color No Abnormalities Noted: Yes No Abnormalities Noted: No Rubor: Yes Moisture No Abnormalities Noted: Yes Temperature / Pain Temperature: No Abnormality Treatment Notes Wound #1 (Achilles) Wound Laterality: Right Cleanser Soap and Water Discharge Instruction: May shower and wash wound with dial antibacterial soap and water prior to dressing change. Wound Cleanser Discharge Instruction: Cleanse the wound with wound cleanser prior to applying a clean dressing using gauze sponges, not tissue or cotton balls. Peri-Wound Care Topical Primary Dressing Hydrofera Blue Ready Transfer Foam, 4x5 (in/in) Discharge Instruction: Apply to wound bed as instructed Secondary Dressing Zetuvit Plus Silicone Border Dressing 4x4 (in/in) Discharge Instruction: Apply silicone border over primary dressing as directed. Secured With Compression Wrap Compression Stockings Environmental education officer) Signed: 04/26/2022 4:23:23 PM By:  [name] RN [name], [name] ([id number]) [id number] Page 9 of 12 Signed: 04/26/2022 4:23:23 PM By: [name] RN Entered By: [name] on 04/24/2022 14:29:15 -------------------------------------------------------------------------------- Wound Assessment Details Patient Name: Date of Service: Misty [name], [name] 04/24/2022 2:15 PM Medical Record Number: [id number] Patient Account Number: [id number] Date of Birth/Sex: Treating RN: [birth year] (77 y.o. [name], [name] Primary Care Zelig Gacek: [name] Other Clinician: Referring Shoua Ressler: Treating Ellar Hakala/Extender: Otelia Sergeant Weeks in Treatment: 7 Wound Status Wound Number: 2 Primary T be determined o Etiology: Wound Location: Right, Posterior Lower Leg Wound Open Wounding Event: Shear/Friction Status: Date Acquired: 04/08/2022 Notes: Pt states she started feeling a little sensation under her wrap Weeks Of Treatment: 1 and now has a new place Clustered Wound: No Comorbid Asthma, Hypertension, Received Radiation History: Photos Wound Measurements Length: (cm) 0.8 Width: (cm) 1.6 Depth: (cm) 0.1 Area: (cm) 1.005 Volume: (cm) 0.101 % Reduction in Area: -42.1% % Reduction in Volume: -42.3% Epithelialization: None Tunneling: No Undermining: No Wound Description Classification: Full  Thickness Without Exposed Support Structures Wound Margin: Distinct, outline attached Exudate Amount: Medium Exudate Type: Serosanguineous Exudate Color: red, brown Foul Odor After Cleansing: No Slough/Fibrino Yes Wound Bed Granulation Amount: Small (1-33%) Exposed Structure Granulation Quality: Red, Pink Fascia Exposed: No Necrotic Amount: Large (67-100%) Fat Layer (Subcutaneous Tissue) Exposed: Yes Necrotic Quality: Adherent Slough Tendon Exposed: No Muscle Exposed: No Joint Exposed: No Bone Exposed: No Periwound Skin Texture Texture Color No Abnormalities Noted: Yes No Abnormalities  Noted: No Rubor: Yes Moisture No Abnormalities Noted: Yes Temperature / Pain Temperature: No Abnormality TYRIANA, GREMS [name] ([id number]) [id number].pdf Page 10 of 12 Treatment Notes Wound #2 (Lower Leg) Wound Laterality: Right, Posterior Cleanser Soap and Water Discharge Instruction: May shower and wash wound with dial antibacterial soap and water prior to dressing change. Wound Cleanser Discharge Instruction: Cleanse the wound with wound cleanser prior to applying a clean dressing using gauze sponges, not tissue or cotton balls. Peri-Wound Care Topical Primary Dressing Hydrofera Blue Ready Transfer Foam, 2.5x2.5 (in/in) Discharge Instruction: Apply directly to wound bed as directed Secondary Dressing Zetuvit Plus Silicone Border Dressing 4x4 (in/in) Discharge Instruction: Apply silicone border over primary dressing as directed. Secured With Compression Wrap Compression Stockings Environmental education officer) Signed: 04/26/2022 4:23:23 PM By: [name] RN Entered By: [name] on 04/24/2022 14:29:37 -------------------------------------------------------------------------------- Wound Assessment Details Patient Name: Date of Service: [name], Misty TRACHTMAN 04/24/2022 2:15 PM Medical Record Number: [id number] Patient Account Number: [id number] Date of Birth/Sex: Treating RN: 1945-04-27 (77 y.o. [name], [name] Primary Care Husna Krone: [name] Other Clinician: Referring Lea Baine: Treating Alonda Weaber/Extender: Otelia Sergeant Weeks in Treatment: 7 Wound Status Wound Number: 3 Primary Etiology: Lesion Wound Location: Right, Anterior Lower Leg Wound Status: Open Wounding Event: Other Lesion Comorbid History: Asthma, Hypertension, Received Radiation Date Acquired: 03/09/2022 Weeks Of Treatment: 1 Clustered Wound: No Photos Wound Measurements Length: (cm) 1.7 Width: (cm) 1.5 KATHALINE, COCHENOUR [name] ([id number]) Depth: (cm) 0.1 Area: (cm) 2.003 Volume:  (cm) 0.2 % Reduction in Area: 0% % Reduction in Volume: 0% [id number] Page 11 of 12 Epithelialization: Small (1-33%) Tunneling: No Undermining: No Wound Description Classification: Full Thickness Without Exposed Support Structures Exudate Amount: Medium Exudate Type: Serous Exudate Color: amber Foul Odor After Cleansing: No Slough/Fibrino Yes Wound Bed Granulation Amount: Medium (34-66%) Exposed Structure Necrotic Amount: Medium (34-66%) Fascia Exposed: No Necrotic Quality: Adherent Slough Fat Layer (Subcutaneous Tissue) Exposed: Yes Tendon Exposed: No Muscle Exposed: No Joint Exposed: No Bone Exposed: No Periwound Skin Texture Texture Color No Abnormalities Noted: Yes No Abnormalities Noted: No Rubor: Yes Moisture No Abnormalities Noted: No Temperature / Pain Dry / Scaly: Yes Temperature: No Abnormality Tenderness on Palpation: Yes Treatment Notes Wound #3 (Lower Leg) Wound Laterality: Right, Anterior Cleanser Soap and Water Discharge Instruction: May shower and wash wound with dial antibacterial soap and water prior to dressing change. Wound Cleanser Discharge Instruction: Cleanse the wound with wound cleanser prior to applying a clean dressing using gauze sponges, not tissue or cotton balls. Peri-Wound Care Zinc Oxide Ointment 30g tube Discharge Instruction: Apply Zinc Oxide to periwound with each dressing change Sween Lotion (Moisturizing lotion) Discharge Instruction: Apply moisturizing lotion as directed Topical Primary Dressing Promogran Prisma Matrix, 4.34 (sq in) (silver collagen) Discharge Instruction: Moisten collagen with hydrogel Secondary Dressing Zetuvit Plus Silicone Border Dressing 4x4 (in/in) Discharge Instruction: Apply silicone border over primary dressing as directed. Secured With Compression Wrap Compression Stockings Environmental education officer) Signed: 04/26/2022 4:23:23 PM By: [name] RN Entered  By: [name] on 04/24/2022  14:30:10 VEOLA, HEGLAND ([id number]) [phone].pdf Page 12 of 12 -------------------------------------------------------------------------------- Vitals Details Patient Name: Date of Service: Misty MARLIESE, HOLSCHER 04/24/2022 2:15 PM Medical Record Number: [id number] Patient Account Number: [id number] Date of Birth/Sex: Treating RN: 09/17/1945 (77 y.o. [name], [name] Primary Care Tyjuan Demetro: [name] Other Clinician: Referring Marck Mcclenny: Treating Irish Breisch/Extender: [name] in Treatment: 7 Vital Signs Time Taken: 14:15 Temperature (F): 98.3 Height (in): 60 Pulse (bpm): 65 Weight (lbs): 126 Respiratory Rate (breaths/min): 18 Body Mass Index (BMI): 24.6 Blood Pressure (mmHg): 169/79 Reference Range: 80 - 120 mg / dl Electronic Signature(s) Signed: 04/26/2022 4:23:23 PM By: [name] RN Entered By: [name] on 04/24/2022 14:19:02

## 2022-04-27 NOTE — Progress Notes (Signed)
[name] ([id number]) [id number] Page 1 of 11 Visit Report for 04/24/2022 Chief Complaint Document Details Patient Name: Date of Service: Misty, [name] 04/24/2022 2:15 PM Medical Record Number: [id number] Patient Account Number: [id number] Date of Birth/Sex: Treating RN: [birth year] (77 y.o. F) Primary Care Provider: [name] Other Clinician: Referring Provider: Treating Provider/Extender: [name] in Treatment: 7 Information Obtained from: Patient Chief Complaint Patient seen for complaints of Non-Healing Wound. Electronic Signature(s) Signed: 04/24/2022 3:59:33 PM By: Fredirick Maudlin MD FACS Entered By: Fredirick Maudlin on 04/24/2022 15:59:33 -------------------------------------------------------------------------------- Debridement Details Patient Name: Date of Service: Misty [name] RE, [name] 04/24/2022 2:15 PM Medical Record Number: [id number] Patient Account Number: [id number] Date of Birth/Sex: Treating RN: 1945-06-22 (77 y.o. [name] Primary Care Provider: [name] Other Clinician: Referring Provider: Treating Provider/Extender: [name] in Treatment: 7 Debridement Performed for Assessment: Wound #3 Right,Anterior Lower Leg Performed By: Physician Fredirick Maudlin, MD Debridement Type: Debridement Level of Consciousness (Pre-procedure): Awake and Alert Pre-procedure Verification/Time Out Yes - 14:43 Taken: Start Time: 14:44 T Area Debrided (L x W): otal 1.7 (cm) x 1.5 (cm) = 2.55 (cm) Tissue and other material debrided: Non-Viable, Slough, Slough Level: Non-Viable Tissue Debridement Description: Selective/Open Wound Instrument: Curette Bleeding: Minimum Hemostasis Achieved: Pressure Response to Treatment: Procedure was tolerated well Level of Consciousness (Post- Awake and Alert procedure): Post Debridement Measurements of Total Wound Length: (cm) 1.7 Width: (cm) 1.5 Depth: (cm)  0.1 Volume: (cm) 0.2 Character of Wound/Ulcer Post Debridement: Requires Further Debridement Post Procedure Diagnosis Same as Pre-procedure Notes [name] ([id number]) [id number] Page 2 of 11 Scribed for Dr. Celine Ahr by [name], RN Electronic Signature(s) Signed: 04/24/2022 5:01:55 PM By: Fredirick Maudlin MD FACS Signed: 04/26/2022 4:23:23 PM By: [name] RN Entered By: [name] on 04/24/2022 14:45:25 -------------------------------------------------------------------------------- Debridement Details Patient Name: Date of Service: Misty [name] RE, [name] 04/24/2022 2:15 PM Medical Record Number: [id number] Patient Account Number: [id number] Date of Birth/Sex: Treating RN: 1945/05/13 (77 y.o. [name], [name] Primary Care Provider: [name] Other Clinician: Referring Provider: Treating Provider/Extender: [name] in Treatment: 7 Debridement Performed for Assessment: Wound #2 Right,Posterior Lower Leg Performed By: Physician Fredirick Maudlin, MD Debridement Type: Debridement Level of Consciousness (Pre-procedure): Awake and Alert Pre-procedure Verification/Time Out Yes - 14:43 Taken: Start Time: 14:44 Pain Control: Lidocaine 4% T opical Solution T Area Debrided (L x W): otal 0.8 (cm) x 1.6 (cm) = 1.28 (cm) Tissue and other material debrided: Non-Viable, Slough, Slough Level: Non-Viable Tissue Debridement Description: Selective/Open Wound Instrument: Curette Bleeding: Minimum Hemostasis Achieved: Pressure Response to Treatment: Procedure was tolerated well Level of Consciousness (Post- Awake and Alert procedure): Post Debridement Measurements of Total Wound Length: (cm) 0.8 Width: (cm) 1.6 Depth: (cm) 0.1 Volume: (cm) 0.101 Character of Wound/Ulcer Post Debridement: Requires Further Debridement Post Procedure Diagnosis Same as Pre-procedure Notes Scribed for Dr. Celine Ahr by [name], RN Electronic  Signature(s) Signed: 04/24/2022 5:01:55 PM By: Fredirick Maudlin MD FACS Signed: 04/26/2022 4:23:23 PM By: [name] RN Entered By: [name] on 04/24/2022 14:46:17 -------------------------------------------------------------------------------- Debridement Details Patient Name: Date of Service: Misty [name] RE, [name] 04/24/2022 2:15 PM Medical Record Number: [id number] Patient Account Number: [id number] Date of Birth/Sex: Treating RN: [birth year] (77 y.o. [name] Primary Care Provider: [name] Other Clinician: Referring Provider: Treating Provider/Extender: Otelia Sergeant [name], [name] ([id number]) [phone].pdf Page 3 of 11 Weeks in Treatment: 7 Debridement Performed for Assessment: Wound #1  Right Achilles Performed By: Physician Fredirick Maudlin, MD Debridement Type: Debridement Level of Consciousness (Pre-procedure): Awake and Alert Pre-procedure Verification/Time Out Yes - 14:43 Taken: Start Time: 14:44 T Area Debrided (L x W): otal 2.1 (cm) x 2.4 (cm) = 5.04 (cm) Tissue and other material debrided: Non-Viable, Slough, Slough Level: Non-Viable Tissue Debridement Description: Selective/Open Wound Instrument: Curette Bleeding: Minimum Hemostasis Achieved: Pressure Response to Treatment: Procedure was tolerated well Level of Consciousness (Post- Awake and Alert procedure): Post Debridement Measurements of Total Wound Length: (cm) 2.1 Width: (cm) 2.4 Depth: (cm) 0.1 Volume: (cm) 0.396 Character of Wound/Ulcer Post Debridement: Requires Further Debridement Post Procedure Diagnosis Same as Pre-procedure Notes Scribed for Dr. Celine Ahr by [name], RN Electronic Signature(s) Signed: 04/24/2022 5:01:55 PM By: Fredirick Maudlin MD FACS Signed: 04/26/2022 4:23:23 PM By: [name] RN Entered By: [name] on 04/24/2022 14:51:16 -------------------------------------------------------------------------------- HPI  Details Patient Name: Date of Service: Misty O RE, [name] 04/24/2022 2:15 PM Medical Record Number: [id number] Patient Account Number: [id number] Date of Birth/Sex: Treating RN: 27-Jun-1945 (77 y.o. F) Primary Care Provider: [name] Other Clinician: Referring Provider: Treating Provider/Extender: [name] in Treatment: 7 History of Present Illness HPI Description: ADMISSION 03/01/2022 This is a 77 year old otherwise fairly healthy woman (not diabetic, not obese, does not smoke) who presents to the clinic with an ulcer on her right posterior leg. She has a history of a liposarcoma excised in 1973. The patient is not sure but believes she received radiation therapy to the location. She did have a skin graft. About 6 months ago, she developed a small ulcer in the area that seemed to start as an abscess. She has had an open wound in that area since then. A shave biopsy was taken by a dermatologist that showed inflammation without evidence of malignancy. She has been applying Vaseline to the site along with periwound TCA. ABI in clinic today is 0.94. Due to the failure of the wound to heal properly, she has been referred to the wound care center for further evaluation and management. 03/09/2022: The tissue on the wound is protruding further from the skin surface. It does not have a typical appearance consistent with hypertrophic granulation tissue. I am concerned that it may represent malignancy. 03/17/2022: Fortunately, the biopsy that I took last week was negative for malignancy. It returned as consistent with an ulcer and inflamed granulation tissue. She still has hypertrophic granulation tissue at the site and some slough accumulation on the surface. No significant change in the wound dimensions. 03/23/2022: The wound measured smaller today and the hypertrophic granulation tissue is less prominent. Due to the unusual shape of her leg from old surgery, however, there has been  some friction from her compressive wrap. 03/31/2022: The wound is slightly smaller today. There is light slough on the surface. There is still some hypertrophic granulation tissue present. 04/12/2022: She has a new wound near the top of her surgical defect. Where the skin rolls over, there has been some friction and abrasion that has resulted in [place] V ([id number]) [phone].pdf Page 4 of 11 tissue breakdown. There is slough buildup on the surface. The original wound is smaller and there is epithelialization around the borders. There is slough buildup on the surface again. She has been approved for TheraSkin but it is unclear what her financial responsibility would be at this point. We are still working on determining that. She is scheduled to undergo surgical excision of the squamous cell carcinoma on her anterior tibial  surface. 04/17/2022: Both posterior leg wounds are smaller today. They both have slough accumulation. She had her Mohs surgery and there is a defect on her anterior tibial surface. I spoke with her dermatologist and we are going to assume care for this wound. There is a layer of rubbery slough on this wound and it appears a little dry. 04/24/2022: All 3 wounds have a thick layer of rubbery slough on them. There is hypertrophic granulation tissue underneath this on both the anterior tibial surface wound and the posterior Achilles wound. She has a wound on her right forearm that was cultured by her dermatologist and grew out heavy Serratia marcescens and moderate Staph aureus. Apparently they are just having her apply topical mupirocin to the site because she has previously had a reaction to moxifloxacin, which is apparently the antibiotic they wanted to use. She asks if I have any other recommendations. Electronic Signature(s) Signed: 04/24/2022 4:01:25 PM By: Fredirick Maudlin MD FACS Entered By: Fredirick Maudlin on 04/24/2022  16:01:24 -------------------------------------------------------------------------------- Physical Exam Details Patient Name: Date of Service: [name], [name] 04/24/2022 2:15 PM Medical Record Number: [id number] Patient Account Number: [id number] Date of Birth/Sex: Treating RN: November 23, 1945 (77 y.o. F) Primary Care Provider: [name] Other Clinician: Referring Provider: Treating Provider/Extender: Otelia Sergeant Weeks in Treatment: 7 Constitutional Hypertensive, asymptomatic. . . . no acute distress. Respiratory Normal work of breathing on room air. Notes 04/24/2022: All 3 wounds have a thick layer of rubbery slough on them. There is hypertrophic granulation tissue underneath this on both the anterior tibial surface wound and the posterior Achilles wound. Electronic Signature(s) Signed: 04/24/2022 4:10:19 PM By: Fredirick Maudlin MD FACS Entered By: Fredirick Maudlin on 04/24/2022 16:10:18 -------------------------------------------------------------------------------- Physician Orders Details Patient Name: Date of Service: Misty [name] RE, Anzleigh V. 04/24/2022 2:15 PM Medical Record Number: [id number] Patient Account Number: [id number] Date of Birth/Sex: Treating RN: December 24, 1945 (77 y.o. [name] Primary Care Provider: [name] Other Clinician: Referring Provider: Treating Provider/Extender: [name] in Treatment: 7 Verbal / Phone Orders: No Diagnosis Coding ICD-10 Coding Code Description [phone] Non-pressure chronic ulcer of other part of right lower leg with fat layer exposed L97.818 Non-pressure chronic ulcer of other part of right lower leg with other specified severity I10 Essential (primary) hypertension Z85.831 Personal history of malignant neoplasm of soft tissue Misty, [name] ([id number]) [id number] Page 5 of 11 Z92.3 Personal history of irradiation Follow-up Appointments ppointment in 1 week. - Dr. Celine Ahr Room 3  or 4 Return A Anesthetic (In clinic) Topical Lidocaine 4% applied to wound bed Cellular or Tissue Based Products Cellular or Tissue Based Product Type: - RUN IVR for [employer] or [employer] May shower with protection but do not get wound dressing(s) wet. Protect dressing(s) with water repellant cover (for example, large plastic bag) or a cast cover and may then take shower. - Please do not get leg wraps wet on Right Leg. May use a cast protector on right leg- can purchase from [employer]; Medical supply store; [facility] etc. the cast protector costs approximately $18-$29 Edema Control - Lymphedema / SCD / Other void standing for long periods of time. - Rest and elevate legs throughout the day. A Moisturize legs daily. - Use a lotion or moisturizer for example :Sween,Eucerin;Aquaphor etc. Wound Treatment Wound #1 - Achilles Wound Laterality: Right Cleanser: Soap and Water 1 x Per Week/30 Days Discharge Instructions: May shower and wash wound with dial antibacterial soap and water prior to dressing  change. Cleanser: Wound Cleanser 1 x Per Week/30 Days Discharge Instructions: Cleanse the wound with wound cleanser prior to applying a clean dressing using gauze sponges, not tissue or cotton balls. Prim Dressing: Hydrofera Blue Ready Transfer Foam, 4x5 (in/in) 1 x Per Week/30 Days ary Discharge Instructions: Apply to wound bed as instructed Secondary Dressing: Zetuvit Plus Silicone Border Dressing 4x4 (in/in) 1 x Per Week/30 Days Discharge Instructions: Apply silicone border over primary dressing as directed. Wound #2 - Lower Leg Wound Laterality: Right, Posterior Cleanser: Soap and Water 1 x Per Week/30 Days Discharge Instructions: May shower and wash wound with dial antibacterial soap and water prior to dressing change. Cleanser: Wound Cleanser 1 x Per Week/30 Days Discharge Instructions: Cleanse the wound with wound cleanser prior to applying a clean dressing using gauze  sponges, not tissue or cotton balls. Prim Dressing: Hydrofera Blue Ready Transfer Foam, 2.5x2.5 (in/in) 1 x Per Week/30 Days ary Discharge Instructions: Apply directly to wound bed as directed Secondary Dressing: Zetuvit Plus Silicone Border Dressing 4x4 (in/in) 1 x Per Week/30 Days Discharge Instructions: Apply silicone border over primary dressing as directed. Wound #3 - Lower Leg Wound Laterality: Right, Anterior Cleanser: Soap and Water 1 x Per Week/30 Days Discharge Instructions: May shower and wash wound with dial antibacterial soap and water prior to dressing change. Cleanser: Wound Cleanser 1 x Per Week/30 Days Discharge Instructions: Cleanse the wound with wound cleanser prior to applying a clean dressing using gauze sponges, not tissue or cotton balls. Peri-Wound Care: Zinc Oxide Ointment 30g tube 1 x Per Week/30 Days Discharge Instructions: Apply Zinc Oxide to periwound with each dressing change Peri-Wound Care: Sween Lotion (Moisturizing lotion) 1 x Per Week/30 Days Discharge Instructions: Apply moisturizing lotion as directed Prim Dressing: Hydrofera Blue Ready Transfer Foam, 4x5 (in/in) 1 x Per Week/30 Days ary Discharge Instructions: Apply to wound bed as instructed Secondary Dressing: Zetuvit Plus Silicone Border Dressing 4x4 (in/in) 1 x Per Week/30 Days Discharge Instructions: Apply silicone border over primary dressing as directed. Patient Medications llergies: bee venom protein (honey bee), moxifloxacin, promethazine HCl, balsam [place], thimerosal A Notifications Medication Indication Start End 04/24/2022 Bactrim DS DOSE oral 800 mg-160 mg tablet - 1 tab p.o. twice daily x 7 days Misty, [name] ([id number]) [id number] Page 6 of 11 Electronic Signature(s) Signed: 04/24/2022 5:01:55 PM By: Fredirick Maudlin MD FACS Previous Signature: 04/24/2022 4:13:11 PM Version By: Fredirick Maudlin MD FACS Entered By: Fredirick Maudlin on 04/24/2022  16:14:37 -------------------------------------------------------------------------------- Problem List Details Patient Name: Date of Service: Misty [name] RE, Mija V. 04/24/2022 2:15 PM Medical Record Number: [id number] Patient Account Number: [id number] Date of Birth/Sex: Treating RN: 1946-02-23 (77 y.o. F) Primary Care Provider: [name] Other Clinician: Referring Provider: Treating Provider/Extender: Otelia Sergeant Weeks in Treatment: 7 Active Problems ICD-10 Encounter Code Description Active Date MDM Diagnosis [phone] Non-pressure chronic ulcer of other part of right lower leg with fat layer 03/01/2022 No Yes exposed L97.818 Non-pressure chronic ulcer of other part of right lower leg with other specified 04/17/2022 No Yes severity I10 Essential (primary) hypertension 03/01/2022 No Yes Z85.831 Personal history of malignant neoplasm of soft tissue 03/01/2022 No Yes Z92.3 Personal history of irradiation 03/01/2022 No Yes Inactive Problems Resolved Problems Electronic Signature(s) Signed: 04/24/2022 3:59:17 PM By: Fredirick Maudlin MD FACS Entered By: Fredirick Maudlin on 04/24/2022 15:59:17 -------------------------------------------------------------------------------- Progress Note Details Patient Name: Date of Service: [name], [name] 04/24/2022 2:15 PM Medical Record Number: [id number] Patient Account Number: [id number] Date of Birth/Sex: Treating RN:  29-Oct-1945 (77 y.o. F) Primary Care Provider: [name] Other Clinician: Referring Provider: Treating Provider/Extender: [name] in Treatment: [address] [place] ([id number]) [id number] Page 7 of 11 Subjective Chief Complaint Information obtained from Patient Patient seen for complaints of Non-Healing Wound. History of Present Illness (HPI) ADMISSION 03/01/2022 This is a 77 year old otherwise fairly healthy woman (not diabetic, not obese, does not smoke) who presents to  the clinic with an ulcer on her right posterior leg. She has a history of a liposarcoma excised in 1973. The patient is not sure but believes she received radiation therapy to the location. She did have a skin graft. About 6 months ago, she developed a small ulcer in the area that seemed to start as an abscess. She has had an open wound in that area since then. A shave biopsy was taken by a dermatologist that showed inflammation without evidence of malignancy. She has been applying Vaseline to the site along with periwound TCA. ABI in clinic today is 0.94. Due to the failure of the wound to heal properly, she has been referred to the wound care center for further evaluation and management. 03/09/2022: The tissue on the wound is protruding further from the skin surface. It does not have a typical appearance consistent with hypertrophic granulation tissue. I am concerned that it may represent malignancy. 03/17/2022: Fortunately, the biopsy that I took last week was negative for malignancy. It returned as consistent with an ulcer and inflamed granulation tissue. She still has hypertrophic granulation tissue at the site and some slough accumulation on the surface. No significant change in the wound dimensions. 03/23/2022: The wound measured smaller today and the hypertrophic granulation tissue is less prominent. Due to the unusual shape of her leg from old surgery, however, there has been some friction from her compressive wrap. 03/31/2022: The wound is slightly smaller today. There is light slough on the surface. There is still some hypertrophic granulation tissue present. 04/12/2022: She has a new wound near the top of her surgical defect. Where the skin rolls over, there has been some friction and abrasion that has resulted in tissue breakdown. There is slough buildup on the surface. The original wound is smaller and there is epithelialization around the borders. There is slough buildup on the surface again.  She has been approved for TheraSkin but it is unclear what her financial responsibility would be at this point. We are still working on determining that. 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 04/17/2022: Both posterior leg wounds are smaller today. They both have slough accumulation. She had her Mohs surgery and there is a defect on her anterior tibial surface. I spoke with her dermatologist and we are going to assume care for this wound. There is a layer of rubbery slough on this wound and it appears a little dry. 04/24/2022: All 3 wounds have a thick layer of rubbery slough on them. There is hypertrophic granulation tissue underneath this on both the anterior tibial surface wound and the posterior Achilles wound. She has a wound on her right forearm that was cultured by her dermatologist and grew out heavy Serratia marcescens and moderate Staph aureus. Apparently they are just having her apply topical mupirocin to the site because she has previously had a reaction to moxifloxacin, which is apparently the antibiotic they wanted to use. She asks if I have any other recommendations. Patient History Information obtained from Patient. Family History Unknown  History. Social History Never smoker, Marital Status - Married, Alcohol Use - Never, Drug Use - No History, Caffeine Use - Never. Medical History Respiratory Patient has history of Asthma - Childhood asthma Cardiovascular Patient has history of Hypertension Oncologic Patient has history of Received Radiation - R/T Liposarcoma in 1972 Hospitalization/Surgery History - Left Breast Lumpectomy;Tonsillectomy ; Squamous cell carcinoma; Hysterectomy;Right Leg liposarcoma. Medical A Surgical History Notes nd Eyes Pt. states she has pre-glaucoma Cardiovascular Hx: Angio-edema r/t Bee venom; Hyperlipidemia Endocrine Hx: hypothyroidism Immunological Hx: Currently being treated  with Immunotherapy (r/t Bee-Venom) Integumentary (Skin) Hx: Urticaria Musculoskeletal Hx: Osteoporosis Oncologic 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)-Biopsy taken to rule out squamous cell carcinoma on right arm and right leg (02/24/22), still pending results Objective [name], [name] ([id number]) [id number] Page 8 of 11 Constitutional Hypertensive, asymptomatic. no acute distress. Vitals Time Taken: 2:15 PM, Height: 60 in, Weight: 126 lbs, BMI: 24.6, Temperature: 98.3 F, Pulse: 65 bpm, Respiratory Rate: 18 breaths/min, Blood Pressure: 169/79 mmHg. Respiratory Normal work of breathing on room air. General Notes: 04/24/2022: All 3 wounds have a thick layer of rubbery slough on them. There is hypertrophic granulation tissue underneath this on both the anterior tibial surface wound and the posterior Achilles wound. Integumentary (Hair, Skin) Wound #1 status is Open. Original cause of wound was Gradually Appeared. The date acquired was: 08/30/2021. The wound has been in treatment 7 weeks. The wound is located on the Right Achilles. The wound measures 2.1cm length x 2.4cm width x 0.1cm depth; 3.958cm^2 area and 0.396cm^3 volume. There is Fat Layer (Subcutaneous Tissue) exposed. There is no tunneling or undermining noted. There is a medium amount of serosanguineous drainage noted. The wound margin is distinct with the outline attached to the wound base. There is medium (34-66%) red, hyper - granulation within the wound bed. There is a medium (34- 66%) amount of necrotic tissue within the wound bed. The periwound skin appearance had no abnormalities noted for texture. The periwound skin appearance had no abnormalities noted for moisture. The periwound skin appearance exhibited: Rubor. Periwound temperature was noted as No Abnormality. Wound #2 status is Open. Original cause of wound was Shear/Friction. The date acquired was: 04/08/2022. The  wound has been in treatment 1 weeks. The wound is located on the Right,Posterior Lower Leg. The wound measures 0.8cm length x 1.6cm width x 0.1cm depth; 1.005cm^2 area and 0.101cm^3 volume. There is Fat Layer (Subcutaneous Tissue) exposed. There is no tunneling or undermining noted. There is a medium amount of serosanguineous drainage noted. The wound margin is distinct with the outline attached to the wound base. There is small (1-33%) red, pink granulation within the wound bed. There is a large (67- 100%) amount of necrotic tissue within the wound bed including Adherent Slough. The periwound skin appearance had no abnormalities noted for texture. The periwound skin appearance had no abnormalities noted for moisture. The periwound skin appearance exhibited: Rubor. Periwound temperature was noted as No Abnormality. Wound #3 status is Open. Original cause of wound was Other Lesion. The date acquired was: 03/09/2022. The wound has been in treatment 1 weeks. The wound is located on the Right,Anterior Lower Leg. The wound measures 1.7cm length x 1.5cm width x 0.1cm depth; 2.003cm^2 area and 0.2cm^3 volume. There is Fat Layer (Subcutaneous Tissue) exposed. There is no tunneling or undermining noted. There is a medium amount of serous drainage noted. There is medium (34- 66%) granulation within the wound bed. There is a medium (34-66%) amount of necrotic  tissue within the wound bed including Adherent Slough. The periwound skin appearance had no abnormalities noted for texture. The periwound skin appearance exhibited: Dry/Scaly, Rubor. Periwound temperature was noted as No Abnormality. The periwound has tenderness on palpation. Assessment Active Problems ICD-10 Non-pressure chronic ulcer of other part of right lower leg with fat layer exposed Non-pressure chronic ulcer of other part of right lower leg with other specified severity Essential (primary) hypertension Personal history of malignant neoplasm of  soft tissue Personal history of irradiation Procedures Wound #1 Pre-procedure diagnosis of Wound #1 is an Abscess located on the Right Achilles . There was a Selective/Open Wound Non-Viable Tissue Debridement with a total area of 5.04 sq cm performed by Fredirick Maudlin, MD. With the following instrument(s): Curette to remove Non-Viable tissue/material. Material removed includes [facility]. No specimens were taken. A time out was conducted at 14:43, prior to the start of the procedure. A Minimum amount of bleeding was controlled with Pressure. The procedure was tolerated well. Post Debridement Measurements: 2.1cm length x 2.4cm width x 0.1cm depth; 0.396cm^3 volume. Character of Wound/Ulcer Post Debridement requires further debridement. Post procedure Diagnosis Wound #1: Same as Pre-Procedure General Notes: Scribed for Dr. Celine Ahr by [name], RN. Pre-procedure diagnosis of Wound #1 is an Abscess located on the Right Achilles . There was a Three Layer Compression Therapy Procedure by [name], RN. Post procedure Diagnosis Wound #1: Same as Pre-Procedure Wound #2 Pre-procedure diagnosis of Wound #2 is a T be determined located on the Right,Posterior Lower Leg . There was a Selective/Open Wound Non-Viable Tissue o Debridement with a total area of 1.28 sq cm performed by Fredirick Maudlin, MD. With the following instrument(s): Curette to remove Non-Viable tissue/material. Material removed includes [facility] after achieving pain control using Lidocaine 4% Topical Solution. No specimens were taken. A time out was conducted at 14:43, prior to the start of the procedure. A Minimum amount of bleeding was controlled with Pressure. The procedure was tolerated well. Post Debridement Measurements: 0.8cm length x 1.6cm width x 0.1cm depth; 0.101cm^3 volume. Character of Wound/Ulcer Post Debridement requires further debridement. Post procedure Diagnosis Wound #2: Same as Pre-Procedure General Notes: Scribed  for Dr. Celine Ahr by [name], RN. Pre-procedure diagnosis of Wound #2 is a T be determined located on the Right,Posterior Lower Leg . There was a Three Layer Compression Therapy o Procedure by [name], RN. Post procedure Diagnosis Wound #2: Same as Pre-Procedure Wound #3 Misty, [name] ([id number]) [id number] Page 9 of 11 Pre-procedure diagnosis of Wound #3 is a Lesion located on the Right,Anterior Lower Leg . There was a Selective/Open Wound Non-Viable Tissue Debridement with a total area of 2.55 sq cm performed by Fredirick Maudlin, MD. With the following instrument(s): Curette to remove Non-Viable tissue/material. Material removed includes [facility]. No specimens were taken. A time out was conducted at 14:43, prior to the start of the procedure. A Minimum amount of bleeding was controlled with Pressure. The procedure was tolerated well. Post Debridement Measurements: 1.7cm length x 1.5cm width x 0.1cm depth; 0.2cm^3 volume. Character of Wound/Ulcer Post Debridement requires further debridement. Post procedure Diagnosis Wound #3: Same as Pre-Procedure General Notes: Scribed for Dr. Celine Ahr by [name], RN. Pre-procedure diagnosis of Wound #3 is a Lesion located on the Right,Anterior Lower Leg . There was a Three Layer Compression Therapy Procedure by [name], RN. Post procedure Diagnosis Wound #3: Same as Pre-Procedure Plan Follow-up Appointments: Return Appointment in 1 week. - Dr. Celine Ahr Room 3 or 4 Anesthetic: (In clinic) Topical  Lidocaine 4% applied to wound bed Cellular or Tissue Based Products: Cellular or Tissue Based Product Type: - RUN IVR for Theraskin or Epicord Bathing/ Shower/ Hygiene: May shower with protection but do not get wound dressing(s) wet. Protect dressing(s) with water repellant cover (for example, large plastic bag) or a cast cover and may then take shower. - Please do not get leg wraps wet on Right Leg. May use a cast  protector on right leg- can purchase from [employer]; Medical supply store; [facility] etc. the cast protector costs approximately $18-$29 Edema Control - Lymphedema / SCD / Other: Avoid standing for long periods of time. - Rest and elevate legs throughout the day. Moisturize legs daily. - Use a lotion or moisturizer for example :Sween,Eucerin;Aquaphor etc. The following medication(s) was prescribed: Bactrim DS oral 800 mg-160 mg tablet 1 tab p.o. twice daily x 7 days starting 04/24/2022 WOUND #1: - Achilles Wound Laterality: Right Cleanser: Soap and Water 1 x Per Week/30 Days Discharge Instructions: May shower and wash wound with dial antibacterial soap and water prior to dressing change. Cleanser: Wound Cleanser 1 x Per Week/30 Days Discharge Instructions: Cleanse the wound with wound cleanser prior to applying a clean dressing using gauze sponges, not tissue or cotton balls. Prim Dressing: Hydrofera Blue Ready Transfer Foam, 4x5 (in/in) 1 x Per Week/30 Days ary Discharge Instructions: Apply to wound bed as instructed Secondary Dressing: Zetuvit Plus Silicone Border Dressing 4x4 (in/in) 1 x Per Week/30 Days Discharge Instructions: Apply silicone border over primary dressing as directed. WOUND #2: - Lower Leg Wound Laterality: Right, Posterior Cleanser: Soap and Water 1 x Per Week/30 Days Discharge Instructions: May shower and wash wound with dial antibacterial soap and water prior to dressing change. Cleanser: Wound Cleanser 1 x Per Week/30 Days Discharge Instructions: Cleanse the wound with wound cleanser prior to applying a clean dressing using gauze sponges, not tissue or cotton balls. Prim Dressing: Hydrofera Blue Ready Transfer Foam, 2.5x2.5 (in/in) 1 x Per Week/30 Days ary Discharge Instructions: Apply directly to wound bed as directed Secondary Dressing: Zetuvit Plus Silicone Border Dressing 4x4 (in/in) 1 x Per Week/30 Days Discharge Instructions: Apply silicone border over primary dressing as  directed. WOUND #3: - Lower Leg Wound Laterality: Right, Anterior Cleanser: Soap and Water 1 x Per Week/30 Days Discharge Instructions: May shower and wash wound with dial antibacterial soap and water prior to dressing change. Cleanser: Wound Cleanser 1 x Per Week/30 Days Discharge Instructions: Cleanse the wound with wound cleanser prior to applying a clean dressing using gauze sponges, not tissue or cotton balls. Peri-Wound Care: Zinc Oxide Ointment 30g tube 1 x Per Week/30 Days Discharge Instructions: Apply Zinc Oxide to periwound with each dressing change Peri-Wound Care: Sween Lotion (Moisturizing lotion) 1 x Per Week/30 Days Discharge Instructions: Apply moisturizing lotion as directed Prim Dressing: Hydrofera Blue Ready Transfer Foam, 4x5 (in/in) 1 x Per Week/30 Days ary Discharge Instructions: Apply to wound bed as instructed Secondary Dressing: Zetuvit Plus Silicone Border Dressing 4x4 (in/in) 1 x Per Week/30 Days Discharge Instructions: Apply silicone border over primary dressing as directed. 04/24/2022: All 3 wounds have a thick layer of rubbery slough on them. There is hypertrophic granulation tissue underneath this on both the anterior tibial surface wound and the posterior Achilles wound. I used a curette to debride slough off of all of her wounds. I am going to use Hydrofera Blue to all sites along with 3 layer compression. In regards to the wound on her forearm, I reviewed the culture  and sensitivities that she presented to me. Both the Serratia and Staph aureus are sensitive to trimethoprim sulfamethoxazole and she says that she has tolerated sulfa drugs in the past. I prescribed a 7-day course of Bactrim. She will follow-up in 1 week. Electronic Signature(s) Signed: 04/24/2022 4:16:03 PM By: Fredirick Maudlin MD FACS Entered By: Fredirick Maudlin on 04/24/2022 Eland, [place] ([id number]) [id number] Page 10 of  11 -------------------------------------------------------------------------------- HxROS Details Patient Name: Date of Service: Misty YOLETTE, BELINSKI 04/24/2022 2:15 PM Medical Record Number: [id number] Patient Account Number: [id number] Date of Birth/Sex: Treating RN: 24-Apr-1945 (77 y.o. 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 Respiratory Medical History: Positive for: Asthma - Childhood asthma Cardiovascular Medical History: Positive for: Hypertension Past Medical History Notes: Hx: Angio-edema r/t Bee venom; Hyperlipidemia Endocrine Medical History: Past Medical History Notes: Hx: hypothyroidism Immunological Medical History: Past Medical History Notes: Hx: Currently being treated with Immunotherapy (r/t Bee-Venom) Integumentary (Skin) Medical History: Past Medical History Notes: Hx: Urticaria Musculoskeletal Medical History: Past Medical History Notes: Hx: Osteoporosis Oncologic Medical History: Positive for: Received Radiation - R/T Liposarcoma in 1972 Past Medical History Notes: 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)-Biopsy taken to rule out squamous cell carcinoma on right arm and right leg (02/24/22), still pending results Immunizations Pneumococcal Vaccine: Received Pneumococcal Vaccination: Yes Received Pneumococcal Vaccination On or After 60th Birthday: Yes Implantable Devices No devices added Hospitalization / Surgery History SHERADYN, KWIATEK ([id number]) [id number] Page 11 of 11 Type of Hospitalization/Surgery Left Breast Lumpectomy;Tonsillectomy ; Squamous cell carcinoma; Hysterectomy;Right Leg liposarcoma Family and Social History Unknown History: Yes; Never smoker; Marital  Status - Married; Alcohol Use: Never; Drug Use: No History; Caffeine Use: Never; Financial Concerns: No; Food, Clothing or Shelter Needs: No; Support System Lacking: No; Transportation Concerns: No Electronic Signature(s) Signed: 04/24/2022 5:01:55 PM By: Fredirick Maudlin MD FACS Entered By: Fredirick Maudlin on 04/24/2022 16:02:05 -------------------------------------------------------------------------------- SuperBill Details Patient Name: Date of Service: Misty [name] RE, [name] 04/24/2022 Medical Record Number: [id number] Patient Account Number: [id number] Date of Birth/Sex: Treating RN: 24-Oct-1945 (77 y.o. F) Primary Care Provider: [name] Other Clinician: Referring Provider: Treating Provider/Extender: Otelia Sergeant Weeks in Treatment: 7 Diagnosis Coding ICD-10 Codes Code Description [phone] Non-pressure chronic ulcer of other part of right lower leg with fat layer exposed L97.818 Non-pressure chronic ulcer of other part of right lower leg with other specified severity I10 Essential (primary) hypertension Z85.831 Personal history of malignant neoplasm of soft tissue Z92.3 Personal history of irradiation Facility Procedures : CPT4 Code: [id number] Description: [phone] - DEBRIDE WOUND 1ST 20 SQ CM OR < ICD-10 Diagnosis Description L97.812 Non-pressure chronic ulcer of other part of right lower leg with fat layer exposed L97.818 Non-pressure chronic ulcer of other part of right lower leg with other  specified s Modifier: everity Quantity: 1 Physician Procedures : CPT4 Code Description Modifier [id number] - WC PHYS LEVEL 4 - EST PT 25 ICD-10 Diagnosis Description L97.812 Non-pressure chronic ulcer of other part of right lower leg with fat layer exposed L97.818 Non-pressure chronic ulcer of other part of right  lower leg with other specified severity Z85.831 Personal history of malignant neoplasm of soft tissue Z92.3 Personal history of irradiation Quantity: 1 : [id number] -  WC PHYS DEBR WO ANESTH 20 SQ CM ICD-10 Diagnosis Description L97.812 Non-pressure chronic  ulcer of other part of right lower leg with fat layer exposed L97.818 Non-pressure chronic ulcer of other part of right lower leg with other  specified severity Quantity: 1 Electronic Signature(s) Signed: 04/24/2022 4:16:25 PM By: Fredirick Maudlin MD FACS Entered By: Fredirick Maudlin on 04/24/2022 16:16:24

## 2022-05-01 ENCOUNTER — Encounter (HOSPITAL_BASED_OUTPATIENT_CLINIC_OR_DEPARTMENT_OTHER): Payer: Medicare HMO | Admitting: General Surgery

## 2022-05-01 DIAGNOSIS — Z923 Personal history of irradiation: Secondary | ICD-10-CM | POA: Diagnosis not present

## 2022-05-01 DIAGNOSIS — I872 Venous insufficiency (chronic) (peripheral): Secondary | ICD-10-CM | POA: Diagnosis not present

## 2022-05-01 DIAGNOSIS — L02415 Cutaneous abscess of right lower limb: Secondary | ICD-10-CM | POA: Diagnosis not present

## 2022-05-01 DIAGNOSIS — L988 Other specified disorders of the skin and subcutaneous tissue: Secondary | ICD-10-CM | POA: Diagnosis not present

## 2022-05-01 DIAGNOSIS — L97812 Non-pressure chronic ulcer of other part of right lower leg with fat layer exposed: Secondary | ICD-10-CM | POA: Diagnosis not present

## 2022-05-01 DIAGNOSIS — Z85831 Personal history of malignant neoplasm of soft tissue: Secondary | ICD-10-CM | POA: Diagnosis not present

## 2022-05-01 DIAGNOSIS — I10 Essential (primary) hypertension: Secondary | ICD-10-CM | POA: Diagnosis not present

## 2022-05-01 DIAGNOSIS — L97212 Non-pressure chronic ulcer of right calf with fat layer exposed: Secondary | ICD-10-CM | POA: Diagnosis not present

## 2022-05-01 DIAGNOSIS — L97818 Non-pressure chronic ulcer of other part of right lower leg with other specified severity: Secondary | ICD-10-CM | POA: Diagnosis not present

## 2022-05-02 NOTE — Progress Notes (Signed)
[name] ([id number]) [id number] Page 1 of 11 Visit Report for 05/01/2022 Chief Complaint Document Details Patient Name: Date of Service: [name], [name] 05/01/2022 2:15 PM Medical Record Number: [id number] Patient Account Number: [id number] Date of Birth/Sex: Treating RN: [birth year] (77 y.o. F) Primary Care Provider: [name] Other Clinician: Referring Provider: Treating Provider/Extender: [name] in Treatment: 8 Information Obtained from: Patient Chief Complaint Patient seen for complaints of Non-Healing Wound. Electronic Signature(s) Signed: 05/01/2022 3:30:19 PM By: Fredirick Maudlin MD FACS Entered By: Fredirick Maudlin on 05/01/2022 15:30:19 -------------------------------------------------------------------------------- Debridement Details Patient Name: Date of Service: [name] 05/01/2022 2:15 PM Medical Record Number: [id number] Patient Account Number: [id number] Date of Birth/Sex: Treating RN: April 02, 1945 (77 y.o. [name] Primary Care Provider: [name] Other Clinician: Referring Provider: Treating Provider/Extender: [name] in Treatment: 8 Debridement Performed for Assessment: Wound #3 Right,Anterior Lower Leg Performed By: Physician Fredirick Maudlin, MD Debridement Type: Debridement Level of Consciousness (Pre-procedure): Awake and Alert Pre-procedure Verification/Time Out Yes - 14:54 Taken: Start Time: 14:55 T Area Debrided (L x W): otal 1.4 (cm) x 1 (cm) = 1.4 (cm) Tissue and other material debrided: Non-Viable, Slough, Slough Level: Non-Viable Tissue Debridement Description: Selective/Open Wound Instrument: Curette Bleeding: Minimum Hemostasis Achieved: Pressure Response to Treatment: Procedure was tolerated well Level of Consciousness (Post- Awake and Alert procedure): Post Debridement Measurements of Total Wound Length: (cm) 1.4 Width: (cm) 1 Depth: (cm)  0.1 Volume: (cm) 0.11 Character of Wound/Ulcer Post Debridement: Requires Further Debridement Post Procedure Diagnosis Same as Pre-procedure Notes Scribed for Dr. Celine Ahr by [name], RN Electronic Signature(s) Signed: 05/01/2022 4:56:09 PM By: [name] RN Signed: 05/01/2022 5:09:54 PM By: Fredirick Maudlin MD FACS Entered By: [name] on 05/01/2022 14:56:05 [name] ([id number]) [id number] Page 2 of 11 -------------------------------------------------------------------------------- Debridement Details Patient Name: Date of Service: [name] 05/01/2022 2:15 PM Medical Record Number: [id number] Patient Account Number: [id number] Date of Birth/Sex: Treating RN: 14-Feb-1946 (77 y.o. [name] Primary Care Provider: [name] Other Clinician: Referring Provider: Treating Provider/Extender: [name] in Treatment: 8 Debridement Performed for Assessment: Wound #1 Right Achilles Performed By: Physician Fredirick Maudlin, MD Debridement Type: Debridement Level of Consciousness (Pre-procedure): Awake and Alert Pre-procedure Verification/Time Out Yes - 14:54 Taken: Start Time: 14:55 T Area Debrided (L x W): otal 2 (cm) x 2.4 (cm) = 4.8 (cm) Tissue and other material debrided: Viable, Non-Viable, Slough, Subcutaneous, Slough Level: Skin/Subcutaneous Tissue Debridement Description: Excisional Instrument: Curette Bleeding: Minimum Hemostasis Achieved: Pressure Response to Treatment: Procedure was tolerated well Level of Consciousness (Post- Awake and Alert procedure): Post Debridement Measurements of Total Wound Length: (cm) 2 Width: (cm) 2.4 Depth: (cm) 0.1 Volume: (cm) 0.377 Character of Wound/Ulcer Post Debridement: Requires Further Debridement Post Procedure Diagnosis Same as Pre-procedure Notes Scribed for Dr. Celine Ahr by [name], RN Electronic Signature(s) Signed: 05/01/2022 4:56:09 PM By:  [name] RN Signed: 05/01/2022 5:09:54 PM By: Fredirick Maudlin MD FACS Entered By: [name] on 05/01/2022 14:57:06 -------------------------------------------------------------------------------- Debridement Details Patient Name: Date of Service: [name], [name] 05/01/2022 2:15 PM Medical Record Number: [id number] Patient Account Number: [id number] Date of Birth/Sex: Treating RN: 04-17-1945 (77 y.o. [name] Primary Care Provider: [name] Other Clinician: Referring Provider: Treating Provider/Extender: [name] in Treatment: 8 Debridement Performed for Assessment: Wound #2 Right,Posterior Lower Leg Performed By: Physician Fredirick Maudlin, MD Debridement Type: Debridement Severity of Tissue Pre  Debridement: Fat layer exposed Level of Consciousness (Pre-procedure): Awake and Alert Pre-procedure Verification/Time Out Yes - 14:54 Taken: Start Time: 14:55 T Area Debrided (L x W): otal 0.5 (cm) x 1.2 (cm) = 0.6 (cm) Tissue and other material debrided: Viable, Non-Viable, Slough, Subcutaneous, Slough Level: Skin/Subcutaneous Tissue Debridement Description: Excisional Instrument: Curette Bleeding: Minimum Hemostasis Achieved: Pressure Response to Treatment: Procedure was tolerated well Level of Consciousness (Post- Awake and Alert procedure): [name] ([id number]) [id number] Page 3 of 11 Post Debridement Measurements of Total Wound Length: (cm) 0.5 Width: (cm) 1.2 Depth: (cm) 0.1 Volume: (cm) 0.047 Character of Wound/Ulcer Post Debridement: Requires Further Debridement Severity of Tissue Post Debridement: Fat layer exposed Post Procedure Diagnosis Same as Pre-procedure Notes Scribed for Dr. Celine Ahr by [name], RN Electronic Signature(s) Signed: 05/01/2022 4:56:09 PM By: [name] RN Signed: 05/01/2022 5:09:54 PM By: Fredirick Maudlin MD FACS Entered By: [name] on 05/01/2022  14:58:54 -------------------------------------------------------------------------------- HPI Details Patient Name: Date of Service: [name], [name] 05/01/2022 2:15 PM Medical Record Number: [id number] Patient Account Number: [id number] Date of Birth/Sex: Treating RN: 1945-09-16 (77 y.o. F) Primary Care Provider: [name] Other Clinician: Referring Provider: Treating Provider/Extender: [name] in Treatment: 8 History of Present Illness HPI Description: ADMISSION 03/01/2022 This is a 77 year old otherwise fairly healthy woman (not diabetic, not obese, does not smoke) who presents to the clinic with an ulcer on her right posterior leg. She has a history of a liposarcoma excised in 1973. The patient is not sure but believes she received radiation therapy to the location. She did have a skin graft. About 6 months ago, she developed a small ulcer in the area that seemed to start as an abscess. She has had an open wound in that area since then. A shave biopsy was taken by a dermatologist that showed inflammation without evidence of malignancy. She has been applying Vaseline to the site along with periwound TCA. ABI in clinic today is 0.94. Due to the failure of the wound to heal properly, she has been referred to the wound care center for further evaluation and management. 03/09/2022: The tissue on the wound is protruding further from the skin surface. It does not have a typical appearance consistent with hypertrophic granulation tissue. I am concerned that it may represent malignancy. 03/17/2022: Fortunately, the biopsy that I took last week was negative for malignancy. It returned as consistent with an ulcer and inflamed granulation tissue. She still has hypertrophic granulation tissue at the site and some slough accumulation on the surface. No significant change in the wound dimensions. 03/23/2022: The wound measured smaller today and the hypertrophic granulation tissue is  less prominent. Due to the unusual shape of her leg from old surgery, however, there has been some friction from her compressive wrap. 03/31/2022: The wound is slightly smaller today. There is light slough on the surface. There is still some hypertrophic granulation tissue present. 04/12/2022: She has a new wound near the top of her surgical defect. Where the skin rolls over, there has been some friction and abrasion that has resulted in tissue breakdown. There is slough buildup on the surface. The original wound is smaller and there is epithelialization around the borders. There is slough buildup on the surface again. She has been approved for TheraSkin but it is unclear what her financial responsibility would be at this point. We are still working on determining that. 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 04/17/2022: Both posterior leg wounds are smaller today. They both have slough accumulation. She had her Mohs surgery and there is a defect on her anterior tibial surface. I spoke with her dermatologist and we are going to assume care for this wound. There is a layer of rubbery slough on this wound and it appears a little dry. 04/24/2022: All 3 wounds have a thick layer of rubbery slough on them. There is hypertrophic granulation tissue underneath this on both the anterior tibial surface wound and the posterior Achilles wound. She has a wound on her right forearm that was cultured by her dermatologist and grew out heavy Serratia marcescens and moderate Staph aureus. Apparently they are just having her apply topical mupirocin to the site because she has previously had a reaction to moxifloxacin, which is apparently the antibiotic they wanted to use. She asks if I have any other recommendations. 05/01/2022: All 3 wounds look better this week. The hypertrophic granulation tissue has resolved on her anterior tibia and posterior Achilles  wound. The wounds are all smaller. They have some slough accumulation. The moisture balance on the anterior tibial wound is good; the posterior wounds are a bit dry. Electronic Signature(s) Signed: 05/01/2022 3:38:38 PM By: Fredirick Maudlin MD FACS Entered By: Fredirick Maudlin on 05/01/2022 15:38:38 [name] ([id number]) [id number] Page 4 of 11 -------------------------------------------------------------------------------- Physical Exam Details Patient Name: Date of Service: [name], [name] 05/01/2022 2:15 PM Medical Record Number: [id number] Patient Account Number: [id number] Date of Birth/Sex: Treating RN: [birth year] (77 y.o. F) Primary Care Provider: [name] Other Clinician: Referring Provider: Treating Provider/Extender: Otelia Sergeant Weeks in Treatment: 8 Constitutional Hypertensive, asymptomatic. . . . no acute distress. Respiratory Normal work of breathing on room air. Notes 05/01/2022: All 3 wounds look better this week. The hypertrophic granulation tissue has resolved on her anterior tibia and posterior Achilles wound. The wounds are all smaller. They have some slough accumulation. The moisture balance on the anterior tibial wound is good; the posterior wounds are a bit dry. Electronic Signature(s) Signed: 05/01/2022 3:40:26 PM By: Fredirick Maudlin MD FACS Entered By: Fredirick Maudlin on 05/01/2022 15:40:26 -------------------------------------------------------------------------------- Physician Orders Details Patient Name: Date of Service: [name], [name] 05/01/2022 2:15 PM Medical Record Number: [id number] Patient Account Number: [id number] Date of Birth/Sex: Treating RN: 1945/07/17 (77 y.o. [name], [name] Primary Care Provider: [name] Other Clinician: Referring Provider: Treating Provider/Extender: [name] in Treatment: 8 Verbal / Phone Orders: No Diagnosis Coding ICD-10 Coding Code  Description [phone] Non-pressure chronic ulcer of other part of right lower leg with fat layer exposed L97.818 Non-pressure chronic ulcer of other part of right lower leg with other specified severity I10 Essential (primary) hypertension Z85.831 Personal history of malignant neoplasm of soft tissue Z92.3 Personal history of irradiation Follow-up Appointments ppointment in 1 week. - Dr. Celine Ahr Room 4 Return A Monday 05/08/22 at 14:15pm Anesthetic (In clinic) Topical Lidocaine 4% applied to wound bed Cellular or Tissue Based Products Cellular or Tissue Based Product Type: - RUN IVR for [employer] or [employer] May shower with protection but do not get wound dressing(s) wet. Protect dressing(s) with water repellant cover (for example, large plastic bag) or a cast cover and may then take shower. - Please do not get leg wraps wet on Right Leg. May use a cast protector on right leg- can purchase from [employer]; Medical supply store; [facility] etc. the cast protector costs approximately $18-$29 Edema  Control - Lymphedema / SCD / Other void standing for long periods of time. - Rest and elevate legs throughout the day. A Moisturize legs daily. - Use a lotion or moisturizer for example :Sween,Eucerin;Aquaphor etc. Wound Treatment Wound #1 - Achilles Wound Laterality: Right Cleanser: Soap and Water 1 x Per Week/30 Days Discharge Instructions: May shower and wash wound with dial antibacterial soap and water prior to dressing change. Cleanser: Wound Cleanser 1 x Per Week/30 Days Discharge Instructions: Cleanse the wound with wound cleanser prior to applying a clean dressing using gauze sponges, not tissue or cotton balls. [name], [name] ([id number]) [id number] Page 5 of 11 Prim Dressing: Promogran Prisma Matrix, 4.34 (sq in) (silver collagen) ary 1 x Per Week/30 Days Discharge Instructions: Moisten collagen with hydrogel Secondary Dressing: Zetuvit Plus Silicone  Border Dressing 4x4 (in/in) 1 x Per Week/30 Days Discharge Instructions: Apply silicone border over primary dressing as directed. Wound #2 - Lower Leg Wound Laterality: Right, Posterior Cleanser: Soap and Water 1 x Per Week/30 Days Discharge Instructions: May shower and wash wound with dial antibacterial soap and water prior to dressing change. Cleanser: Wound Cleanser 1 x Per Week/30 Days Discharge Instructions: Cleanse the wound with wound cleanser prior to applying a clean dressing using gauze sponges, not tissue or cotton balls. Prim Dressing: Promogran Prisma Matrix, 4.34 (sq in) (silver collagen) ary 1 x Per Week/30 Days Discharge Instructions: Moisten collagen with hydrogel Secondary Dressing: Zetuvit Plus Silicone Border Dressing 4x4 (in/in) 1 x Per Week/30 Days Discharge Instructions: Apply silicone border over primary dressing as directed. Wound #3 - Lower Leg Wound Laterality: Right, Anterior Cleanser: Soap and Water 1 x Per Week/30 Days Discharge Instructions: May shower and wash wound with dial antibacterial soap and water prior to dressing change. Cleanser: Wound Cleanser 1 x Per Week/30 Days Discharge Instructions: Cleanse the wound with wound cleanser prior to applying a clean dressing using gauze sponges, not tissue or cotton balls. Peri-Wound Care: Zinc Oxide Ointment 30g tube 1 x Per Week/30 Days Discharge Instructions: Apply Zinc Oxide to periwound with each dressing change Peri-Wound Care: Sween Lotion (Moisturizing lotion) 1 x Per Week/30 Days Discharge Instructions: Apply moisturizing lotion as directed Prim Dressing: Promogran Prisma Matrix, 4.34 (sq in) (silver collagen) ary 1 x Per Week/30 Days Discharge Instructions: Moisten collagen with hydrogel Secondary Dressing: Zetuvit Plus Silicone Border Dressing 4x4 (in/in) 1 x Per Week/30 Days Discharge Instructions: Apply silicone border over primary dressing as directed. Electronic Signature(s) Signed: 05/01/2022  5:09:54 PM By: Fredirick Maudlin MD FACS Previous Signature: 05/01/2022 2:52:20 PM Version By: [name] RN Entered By: Fredirick Maudlin on 05/01/2022 15:40:41 -------------------------------------------------------------------------------- Problem List Details Patient Name: Date of Service: [name], [name] 05/01/2022 2:15 PM Medical Record Number: [id number] Patient Account Number: [id number] Date of Birth/Sex: Treating RN: [birth year] (77 y.o. F) Primary Care Provider: [name] Other Clinician: Referring Provider: Treating Provider/Extender: Otelia Sergeant Weeks in Treatment: 8 Active Problems ICD-10 Encounter Code Description Active Date MDM Diagnosis [phone] Non-pressure chronic ulcer of other part of right lower leg with fat layer 03/01/2022 No Yes exposed L97.818 Non-pressure chronic ulcer of other part of right lower leg with other specified 04/17/2022 No Yes severity I10 Essential (primary) hypertension 03/01/2022 No Yes MKENZIE, TACKER ([id number]) [phone].pdf Page 6 of [phone] Personal history of malignant neoplasm of soft tissue 03/01/2022 No Yes Z92.3 Personal history of irradiation 03/01/2022 No Yes Inactive Problems Resolved Problems Electronic Signature(s) Signed: 05/01/2022 3:29:15 PM By: Fredirick Maudlin MD FACS Entered  By: Fredirick Maudlin on 05/01/2022 15:29:15 -------------------------------------------------------------------------------- Progress Note Details Patient Name: Date of Service: [name], [name] 05/01/2022 2:15 PM Medical Record Number: [id number] Patient Account Number: [id number] Date of Birth/Sex: Treating RN: [birth year] (77 y.o. F) Primary Care Provider: [name] Other Clinician: Referring Provider: Treating Provider/Extender: [name] in Treatment: 8 Subjective Chief Complaint Information obtained from Patient Patient seen for complaints of Non-Healing Wound. History of  Present Illness (HPI) ADMISSION 03/01/2022 This is a 77 year old otherwise fairly healthy woman (not diabetic, not obese, does not smoke) who presents to the clinic with an ulcer on her right posterior leg. She has a history of a liposarcoma excised in 1973. The patient is not sure but believes she received radiation therapy to the location. She did have a skin graft. About 6 months ago, she developed a small ulcer in the area that seemed to start as an abscess. She has had an open wound in that area since then. A shave biopsy was taken by a dermatologist that showed inflammation without evidence of malignancy. She has been applying Vaseline to the site along with periwound TCA. ABI in clinic today is 0.94. Due to the failure of the wound to heal properly, she has been referred to the wound care center for further evaluation and management. 03/09/2022: The tissue on the wound is protruding further from the skin surface. It does not have a typical appearance consistent with hypertrophic granulation tissue. I am concerned that it may represent malignancy. 03/17/2022: Fortunately, the biopsy that I took last week was negative for malignancy. It returned as consistent with an ulcer and inflamed granulation tissue. She still has hypertrophic granulation tissue at the site and some slough accumulation on the surface. No significant change in the wound dimensions. 03/23/2022: The wound measured smaller today and the hypertrophic granulation tissue is less prominent. Due to the unusual shape of her leg from old surgery, however, there has been some friction from her compressive wrap. 03/31/2022: The wound is slightly smaller today. There is light slough on the surface. There is still some hypertrophic granulation tissue present. 04/12/2022: She has a new wound near the top of her surgical defect. Where the skin rolls over, there has been some friction and abrasion that has resulted in tissue breakdown. There is  slough buildup on the surface. The original wound is smaller and there is epithelialization around the borders. There is slough buildup on the surface again. She has been approved for TheraSkin but it is unclear what her financial responsibility would be at this point. We are still working on determining that. 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 04/17/2022: Both posterior leg wounds are smaller today. They both have slough accumulation. She had her Mohs surgery and there is a defect on her anterior tibial surface. I spoke with her dermatologist and we are going to assume care for this wound. There is a layer of rubbery slough on this wound and it appears a little dry. 04/24/2022: All 3 wounds have a thick layer of rubbery slough on them. There is hypertrophic granulation tissue underneath this on both the anterior tibial surface wound and the posterior Achilles wound. She has a wound on her right forearm that was cultured by her dermatologist and grew out heavy Serratia marcescens and moderate Staph aureus. Apparently they are just having her apply topical mupirocin to the site because she has previously had a reaction to  moxifloxacin, which is apparently the antibiotic they wanted to use. She asks if I have any other recommendations. 05/01/2022: All 3 wounds look better this week. The hypertrophic granulation tissue has resolved on her anterior tibia and posterior Achilles wound. The wounds are all smaller. They have some slough accumulation. The moisture balance on the anterior tibial wound is good; the posterior wounds are a bit dry. Patient History Information obtained from Patient. Family History Unknown History. Social History [name], [name] ([id number]) [id number] Page 7 of 11 Never smoker, Marital Status - Married, Alcohol Use - Never, Drug Use - No History, Caffeine Use - Never. Medical  History Respiratory Patient has history of Asthma - Childhood asthma Cardiovascular Patient has history of Hypertension Oncologic Patient has history of Received Radiation - R/T Liposarcoma in 1972 Hospitalization/Surgery History - Left Breast Lumpectomy;Tonsillectomy ; Squamous cell carcinoma; Hysterectomy;Right Leg liposarcoma. 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 no acute distress. Vitals Time Taken: 2:28 PM, Height: 60 in, Weight: 126 lbs, BMI: 24.6, Temperature: 98.1 F, Pulse: 61 bpm, Respiratory Rate: 18 breaths/min, Blood Pressure: 167/70 mmHg. Respiratory Normal work of breathing on room air. General Notes: 05/01/2022: All 3 wounds look better this week. The hypertrophic granulation tissue has resolved on her anterior tibia and posterior Achilles wound. The wounds are all smaller. They have some slough accumulation. The moisture balance on the anterior tibial wound is good; the posterior wounds are a bit dry. Integumentary (Hair, Skin) Wound #1 status is Open. Original cause of wound was Gradually Appeared. The date acquired was: 08/30/2021. The wound has been in treatment 8 weeks. The wound is located on the Right Achilles. The wound measures 2cm length x 2.4cm width x 0.1cm depth; 3.77cm^2 area and 0.377cm^3 volume. There is Fat Layer (Subcutaneous Tissue) exposed. There is no tunneling or undermining noted. There is a medium amount of serosanguineous drainage  noted. The wound margin is distinct with the outline attached to the wound base. There is medium (34-66%) red, hyper - granulation within the wound bed. There is a medium (34- 66%) amount of necrotic tissue within the wound bed. The periwound skin appearance had no abnormalities noted for texture. The periwound skin appearance had no abnormalities noted for moisture. The periwound skin appearance exhibited: Rubor. Periwound temperature was noted as No Abnormality. Wound #2 status is Open. Original cause of wound was Shear/Friction. The date acquired was: 04/08/2022. The wound has been in treatment 2 weeks. The wound is located on the Right,Posterior Lower Leg. The wound measures 0.5cm length x 1.2cm width x 0.1cm depth; 0.471cm^2 area and 0.047cm^3 volume. There is Fat Layer (Subcutaneous Tissue) exposed. There is no tunneling or undermining noted. There is a medium amount of serosanguineous drainage noted. The wound margin is distinct with the outline attached to the wound base. There is medium (34-66%) pink granulation within the wound bed. There is a medium (34- 66%) amount of necrotic tissue within the wound bed including Adherent Slough. The periwound skin appearance had no abnormalities noted for texture. The periwound skin appearance had no abnormalities noted for moisture. The periwound skin appearance exhibited: Rubor. Periwound temperature was noted as No  Abnormality. Wound #3 status is Open. Original cause of wound was Other Lesion. The date acquired was: 03/09/2022. The wound has been in treatment 2 weeks. The wound is located on the Right,Anterior Lower Leg. The wound measures 1.4cm length x 1cm width x 0.1cm depth; 1.1cm^2 area and 0.11cm^3 volume. There is Fat Layer (Subcutaneous Tissue) exposed. There is no tunneling or undermining noted. There is a medium amount of serous drainage noted. There is medium (34- 66%) granulation within the wound bed. There is a medium (34-66%) amount of  necrotic tissue within the wound bed including Adherent Slough. The periwound skin appearance had no abnormalities noted for texture. The periwound skin appearance exhibited: Dry/Scaly, Rubor. Periwound temperature was noted as No Abnormality. The periwound has tenderness on palpation. Assessment Active Problems ICD-10 Non-pressure chronic ulcer of other part of right lower leg with fat layer exposed Non-pressure chronic ulcer of other part of right lower leg with other specified severity Essential (primary) hypertension [name], [name] ([id number]) [phone].pdf Page 8 of 11 Personal history of malignant neoplasm of soft tissue Personal history of irradiation Procedures Wound #1 Pre-procedure diagnosis of Wound #1 is an Abscess located on the Right Achilles . There was a Excisional Skin/Subcutaneous Tissue Debridement with a total area of 4.8 sq cm performed by Fredirick Maudlin, MD. With the following instrument(s): Curette to remove Viable and Non-Viable tissue/material. Material removed includes Subcutaneous Tissue and Slough and. No specimens were taken. A time out was conducted at 14:54, prior to the start of the procedure. A Minimum amount of bleeding was controlled with Pressure. The procedure was tolerated well. Post Debridement Measurements: 2cm length x 2.4cm width x 0.1cm depth; 0.377cm^3 volume. Character of Wound/Ulcer Post Debridement requires further debridement. Post procedure Diagnosis Wound #1: Same as Pre-Procedure General Notes: Scribed for Dr. Celine Ahr by [name], RN. Pre-procedure diagnosis of Wound #1 is an Abscess located on the Right Achilles . There was a Three Layer Compression Therapy Procedure by [name], RN. Post procedure Diagnosis Wound #1: Same as Pre-Procedure Wound #2 Pre-procedure diagnosis of Wound #2 is a Venous Leg Ulcer located on the Right,Posterior Lower Leg .Severity of Tissue Pre Debridement is: Fat layer exposed.  There was a Excisional Skin/Subcutaneous Tissue Debridement with a total area of 0.6 sq cm performed by Fredirick Maudlin, MD. With the following instrument(s): Curette to remove Viable and Non-Viable tissue/material. Material removed includes Subcutaneous Tissue and Slough and. No specimens were taken. A time out was conducted at 14:54, prior to the start of the procedure. A Minimum amount of bleeding was controlled with Pressure. The procedure was tolerated well. Post Debridement Measurements: 0.5cm length x 1.2cm width x 0.1cm depth; 0.047cm^3 volume. Character of Wound/Ulcer Post Debridement requires further debridement. Severity of Tissue Post Debridement is: Fat layer exposed. Post procedure Diagnosis Wound #2: Same as Pre-Procedure General Notes: Scribed for Dr. Celine Ahr by [name], RN. Pre-procedure diagnosis of Wound #2 is a Venous Leg Ulcer located on the Right,Posterior Lower Leg . There was a Three Layer Compression Therapy Procedure by [name], RN. Post procedure Diagnosis Wound #2: Same as Pre-Procedure Wound #3 Pre-procedure diagnosis of Wound #3 is a Lesion located on the Right,Anterior Lower Leg . There was a Selective/Open Wound Non-Viable Tissue Debridement with a total area of 1.4 sq cm performed by Fredirick Maudlin, MD. With the following instrument(s): Curette to remove Non-Viable tissue/material. Material removed includes [facility]. No specimens were taken. A time out was conducted at 14:54, prior to the start of the  procedure. A Minimum amount of bleeding was controlled with Pressure. The procedure was tolerated well. Post Debridement Measurements: 1.4cm length x 1cm width x 0.1cm depth; 0.11cm^3 volume. Character of Wound/Ulcer Post Debridement requires further debridement. Post procedure Diagnosis Wound #3: Same as Pre-Procedure General Notes: Scribed for Dr. Celine Ahr by [name], RN. Pre-procedure diagnosis of Wound #3 is a Lesion located on the Right,Anterior  Lower Leg . There was a Three Layer Compression Therapy Procedure by [name], RN. Post procedure Diagnosis Wound #3: Same as Pre-Procedure Plan Follow-up Appointments: Return Appointment in 1 week. - Dr. Celine Ahr Room 4 Monday 05/08/22 at 14:15pm Anesthetic: (In clinic) Topical Lidocaine 4% applied to wound bed Cellular or Tissue Based Products: Cellular or Tissue Based Product Type: - RUN IVR for Theraskin or Epicord Bathing/ Shower/ Hygiene: May shower with protection but do not get wound dressing(s) wet. Protect dressing(s) with water repellant cover (for example, large plastic bag) or a cast cover and may then take shower. - Please do not get leg wraps wet on Right Leg. May use a cast protector on right leg- can purchase from [employer]; Medical supply store; [facility] etc. the cast protector costs approximately $18-$29 Edema Control - Lymphedema / SCD / Other: Avoid standing for long periods of time. - Rest and elevate legs throughout the day. Moisturize legs daily. - Use a lotion or moisturizer for example :Sween,Eucerin;Aquaphor etc. WOUND #1: - Achilles Wound Laterality: Right Cleanser: Soap and Water 1 x Per Week/30 Days Discharge Instructions: May shower and wash wound with dial antibacterial soap and water prior to dressing change. Cleanser: Wound Cleanser 1 x Per Week/30 Days Discharge Instructions: Cleanse the wound with wound cleanser prior to applying a clean dressing using gauze sponges, not tissue or cotton balls. Prim Dressing: Promogran Prisma Matrix, 4.34 (sq in) (silver collagen) 1 x Per Week/30 Days ary Discharge Instructions: Moisten collagen with hydrogel Secondary Dressing: Zetuvit Plus Silicone Border Dressing 4x4 (in/in) 1 x Per Week/30 Days Discharge Instructions: Apply silicone border over primary dressing as directed. WOUND #2: - Lower Leg Wound Laterality: Right, Posterior Cleanser: Soap and Water 1 x Per Week/30 Days Discharge Instructions: May shower and wash  wound with dial antibacterial soap and water prior to dressing change. Cleanser: Wound Cleanser 1 x Per Week/30 Days Discharge Instructions: Cleanse the wound with wound cleanser prior to applying a clean dressing using gauze sponges, not tissue or cotton balls. Prim Dressing: Promogran Prisma Matrix, 4.34 (sq in) (silver collagen) 1 x Per Week/30 Days ary Discharge Instructions: Moisten collagen with hydrogel Secondary Dressing: Zetuvit Plus Silicone Border Dressing 4x4 (in/in) 1 x Per Week/30 Days [name], HEM ([id number]) [phone].pdf Page 9 of 11 Discharge Instructions: Apply silicone border over primary dressing as directed. WOUND #3: - Lower Leg Wound Laterality: Right, Anterior Cleanser: Soap and Water 1 x Per Week/30 Days Discharge Instructions: May shower and wash wound with dial antibacterial soap and water prior to dressing change. Cleanser: Wound Cleanser 1 x Per Week/30 Days Discharge Instructions: Cleanse the wound with wound cleanser prior to applying a clean dressing using gauze sponges, not tissue or cotton balls. Peri-Wound Care: Zinc Oxide Ointment 30g tube 1 x Per Week/30 Days Discharge Instructions: Apply Zinc Oxide to periwound with each dressing change Peri-Wound Care: Sween Lotion (Moisturizing lotion) 1 x Per Week/30 Days Discharge Instructions: Apply moisturizing lotion as directed Prim Dressing: Promogran Prisma Matrix, 4.34 (sq in) (silver collagen) 1 x Per Week/30 Days ary Discharge Instructions: Moisten collagen with hydrogel Secondary Dressing: Zetuvit  Plus Silicone Border Dressing 4x4 (in/in) 1 x Per Week/30 Days Discharge Instructions: Apply silicone border over primary dressing as directed. 05/01/2022: All 3 wounds look better this week. The hypertrophic granulation tissue has resolved on her anterior tibia and posterior Achilles wound. The wounds are all smaller. They have some slough accumulation. The moisture balance on the  anterior tibial wound is good; the posterior wounds are a bit dry. I used a curette to debride slough from the anterior tibial wound and slough and nonviable subcutaneous tissue from the posterior leg wounds. I am going to change all of the wounds to Prisma silver collagen with hydrogel as the contact layer to try and get better moisture balance. We will continue 3 layer compression. Follow-up in 1 week. Electronic Signature(s) Signed: 05/01/2022 3:41:48 PM By: Fredirick Maudlin MD FACS Entered By: Fredirick Maudlin on 05/01/2022 15:41:48 -------------------------------------------------------------------------------- HxROS Details Patient Name: Date of Service: [name], Angelia [name]. 05/01/2022 2:15 PM Medical Record Number: [id number] Patient Account Number: [id number] Date of Birth/Sex: Treating RN: 09/20/1945 (77 y.o. 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 Respiratory Medical History: Positive for: Asthma - Childhood asthma Cardiovascular Medical History: Positive for: Hypertension Past Medical History Notes: Hx: Angio-edema r/t Bee venom; Hyperlipidemia Endocrine Medical History: Past Medical History Notes: Hx: hypothyroidism Immunological Medical History: Past Medical History Notes: Hx: Currently being treated with Immunotherapy (r/t Bee-Venom) Integumentary (Skin) Medical History: Past Medical History Notes: Hx: Urticaria ALEXA, FAVELA ([id number]) [id number] Page 10 of 11 Musculoskeletal Medical History: Past Medical History Notes: Hx: Osteoporosis Oncologic Medical History: Positive for: Received Radiation - R/T Liposarcoma in 1972 Past Medical History Notes: 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)-Biopsy taken to rule out squamous cell carcinoma on right arm and right leg (02/24/22), still pending results Immunizations Pneumococcal Vaccine: Received Pneumococcal Vaccination: Yes Received Pneumococcal Vaccination On or After 60th Birthday: Yes Implantable Devices No devices added Hospitalization / Surgery History Type of Hospitalization/Surgery Left Breast Lumpectomy;Tonsillectomy ; Squamous cell carcinoma; Hysterectomy;Right Leg liposarcoma Family and Social History Unknown History: Yes; Never smoker; Marital Status - Married; Alcohol Use: Never; Drug Use: No History; Caffeine Use: Never; Financial Concerns: No; Food, Clothing or Shelter Needs: No; Support System Lacking: No; Transportation Concerns: No Electronic Signature(s) Signed: 05/01/2022 5:09:54 PM By: Fredirick Maudlin MD FACS Entered By: Fredirick Maudlin on 05/01/2022 15:39:57 -------------------------------------------------------------------------------- SuperBill Details Patient Name: Date of Service: Misty Abbott Pao 05/01/2022 Medical Record Number: [id number] Patient Account Number: [id number] Date of Birth/Sex: Treating RN: [birth year] (77 y.o. F) Primary Care Provider: [name] Other Clinician: Referring Provider: Treating Provider/Extender: Otelia Sergeant Weeks in Treatment: 8 Diagnosis Coding ICD-10 Codes Code Description [phone] Non-pressure chronic ulcer of other part of right lower leg with fat layer exposed L97.818 Non-pressure chronic ulcer of other part of right lower leg with other specified severity I10 Essential (primary) hypertension Z85.831 Personal history of malignant neoplasm of soft tissue Z92.3 Personal history of irradiation Facility Procedures : CPT4 Code: [id number] Description: 11042 - DEB SUBQ TISSUE 20 SQ CM/< ICD-10 Diagnosis Description L97.812 Non-pressure chronic ulcer of other part of right lower leg with fat layer exposed Modifier: Quantity:  1 : CPT4 Code: [id number] Description: [id number] - DEBRIDE WOUND 1ST 20 SQ CM OR < ICD-10 Diagnosis Description L97.818 Non-pressure chronic ulcer of other part of right lower  leg with other specified s Modifier: everity Quantity: 1 Physician Procedures : CPT4 Code Description Modifier WAYLYN, PROSISE ([id number]) [id number] - WC PHYS LEVEL 3 - EST PT 25 1 ICD-10 Diagnosis Description [id number] Non-pressure chronic ulcer of other part of right lower leg with fat  layer exposed L97.818 Non-pressure chronic ulcer of other part of right lower leg with other specified severity Z85.831 Personal history of malignant neoplasm of soft tissue Z92.3 Personal history of irradiation Quantity: Page 11 of 11 : [id number] - WC PHYS SUBQ TISS 20 SQ CM 1 ICD-10 Diagnosis Description L97.812 Non-pressure chronic ulcer of other part of right lower leg with fat layer exposed Quantity: : [id number] - WC PHYS DEBR WO ANESTH 20 SQ CM 1 ICD-10 Diagnosis Description L97.818 Non-pressure chronic ulcer of other part of right lower leg with other specified severity Quantity: Electronic Signature(s) Signed: 05/01/2022 3:43:56 PM By: Fredirick Maudlin MD FACS Entered By: Fredirick Maudlin on 05/01/2022 15:43:56

## 2022-05-02 NOTE — Progress Notes (Signed)
[name] [name], [name] [name] ([id number]) [id number] Page 1 of 11 Visit Report for 05/01/2022 Arrival Information Details Patient Name: Date of Service: [name] [name], [name] [name] 05/01/2022 2:15 PM Medical Record Number: [id number] Patient Account Number: [id number] Date of Birth/Sex: Treating RN: 05-May-1945 (77 y.o. [name] [name] Primary Care [name] [name]: [name] [name] Other Clinician: Referring [name] [name]: Treating [name] [name]/Extender: [name] [name] in Treatment: [name] Visit Information History Since Last Visit Added or deleted any medications: No Patient Arrived: Ambulatory Any new allergies or adverse reactions: No Arrival Time: 14:28 Had a fall or experienced change in No Accompanied By: self activities of daily living that may affect Transfer Assistance: None risk of falls: Patient Identification Verified: Yes Signs or symptoms of abuse/neglect since last visito No Secondary Verification Process Completed: Yes Hospitalized since last visit: No Patient Requires Transmission-Based Precautions: No Implantable device outside of the clinic excluding No cellular tissue based products placed in the center since last visit: Has Compression in Place as Prescribed: Yes Pain Present Now: No Electronic Signature(s) Signed: 05/01/2022 4:56:09 PM By: [name] East RN Entered By: [name] [name] on 05/01/2022 14:28:28 -------------------------------------------------------------------------------- Compression Therapy Details Patient Name: Date of Service: [name] [name], [name] V. 05/01/2022 2:15 PM Medical Record Number: [id number] Patient Account Number: [id number] Date of Birth/Sex: Treating RN: [birth year] (77 y.o. [name] [name] Primary Care Samaira [name]: [name] [name] Other Clinician: Referring [name] [name]: Treating [name] [name]/Extender: [name] [name] in Treatment: [name] Compression Therapy Performed for Wound Assessment: Wound #1 Right Achilles Performed By: Clinician [name] East, RN Compression Type: Three Layer Post Procedure Diagnosis Same as Pre-procedure Electronic Signature(s) Signed: 05/01/2022 4:56:09 PM By: [name] East RN Entered By: [name] [name] on 05/01/2022 14:59:13 -------------------------------------------------------------------------------- Compression Therapy Details Patient Name: Date of Service: [name] [name], [name] [name] 05/01/2022 2:15 PM Medical Record Number: [id number] Patient Account Number: [id number] Date of Birth/Sex: Treating RN: [birth year] (77 y.o. [name] [name] Primary Care Mekaila Tarnow: [name] [name] Other Clinician: Referring Matrice Herro: Treating Christohper Dube/Extender: [name] [name] in Treatment: [name] Compression Therapy Performed for Wound Assessment: Wound #2 Right,Posterior Lower Leg Performed By: Clinician [name] East, RN Compression Type: Three Layer Post Procedure Diagnosis Same as Pre-procedure YAMINAH, MICEK V ([id number]) [id number] Page 2 of 11 Electronic Signature(s) Signed: 05/01/2022 4:56:09 PM By: [name] East RN Entered By: [name] [name] on 05/01/2022 14:59:13 -------------------------------------------------------------------------------- Compression Therapy Details Patient Name: Date of Service: [name] [name] 05/01/2022 2:15 PM Medical Record Number: [id number] Patient Account Number: [id number] Date of Birth/Sex: Treating RN: 1946/03/01 (77 y.o. [name] [name] Primary Care Kron Everton: [name] [name] Other Clinician: Referring Amiyrah Lamere: Treating Awilda Covin/Extender: [name] [name] in Treatment: [name] Compression Therapy Performed for Wound Assessment: Wound #3 Right,Anterior Lower Leg Performed By: Clinician [name] East, RN Compression Type: Three Layer Post Procedure Diagnosis Same as Pre-procedure Electronic Signature(s) Signed: 05/01/2022 4:56:09 PM By: [name] East RN Entered By: [name] [name] on 05/01/2022  14:59:13 -------------------------------------------------------------------------------- Encounter Discharge Information Details Patient Name: Date of Service: [name] [name], [name] V. 05/01/2022 2:15 PM Medical Record Number: [id number] Patient Account Number: [id number] Date of Birth/Sex: Treating RN: [name] [name] (77 y.o. [name] [name] Primary Care Kisean Rollo: [name] [name] Other Clinician: Referring Harlea Goetzinger: Treating [name] [name]/Extender: [name] [name] in Treatment: [name] Encounter Discharge Information Items Post Procedure Vitals Discharge Condition: Stable Temperature (F): 98.1 Ambulatory Status: Ambulatory Pulse (bpm): 61 Discharge Destination: Home Respiratory Rate (breaths/min): 18 Transportation: Private Auto Blood Pressure (mmHg): 167/70 Accompanied By: self Schedule Follow-up Appointment: Yes Clinical Summary of Care: Electronic Signature(s)  Signed: 05/01/2022 4:56:09 PM By: [name] East RN Entered By: [name] [name] on 05/01/2022 15:01:30 -------------------------------------------------------------------------------- Lower Extremity Assessment Details Patient Name: Date of Service: [name] [name], [name] [name] 05/01/2022 2:15 PM Medical Record Number: [id number] Patient Account Number: [id number] Date of Birth/Sex: Treating RN: 07/31/1945 (77 y.o. [name] [name] Primary Care Shakora Nordquist: [name] [name] Other Clinician: Referring Aimy Sweeting: Treating Alieu Finnigan/Extender: [name] [name] Edema Assessment Assessed: [Left: No] [Right: No] [Left: Edema] [Right: :] Calf Left: Right: Point of Measurement: 33 cm From Medial Instep 31.2 cm [name] [name] ([id number].pdf Page 3 of 11 Ankle Left: Right: Point of Measurement: 9 cm From Medial Instep 19.2 cm Vascular Assessment Pulses: Dorsalis Pedis Palpable: [Right:Yes] Electronic Signature(s) Signed: 05/01/2022 4:56:09 PM By: [name] East RN Entered By: [name] [name] on  05/01/2022 14:35:13 -------------------------------------------------------------------------------- Multi Wound Chart Details Patient Name: Date of Service: [name] [name], [name] [name] 05/01/2022 2:15 PM Medical Record Number: [id number] Patient Account Number: [id number] Date of Birth/Sex: Treating RN: 07-03-1945 (77 y.o. F) Primary Care Helayne Metsker: [name] [name] Other Clinician: Referring Quintessa Simmerman: Treating [name] Battenfield/Extender: [name] [name] in Treatment: [name] Vital Signs Height(in): 60 Pulse(bpm): 59 Weight(lbs): 126 Blood Pressure(mmHg): 167/70 Body Mass Index(BMI): 24.6 Temperature(F): 98.1 Respiratory Rate(breaths/min): 18 [1:Photos:] Right Achilles Right, Posterior Lower Leg Right, Anterior Lower Leg Wound Location: Gradually Appeared Shear/Friction Other Lesion Wounding Event: Abscess Venous Leg Ulcer Lesion Primary Etiology: Asthma, Hypertension, Received Asthma, Hypertension, Received Asthma, Hypertension, Received Comorbid History: Radiation Radiation Radiation 08/30/2021 04/08/2022 03/09/2022 Date Acquired: '[name] 2 2 '$ Weeks of Treatment: Open Open Open Wound Status: No No No Wound Recurrence: 2x2.4x0.1 0.5x1.2x0.1 1.4x1x0.1 Measurements L x W x D (cm) 3.77 0.471 1.1 A (cm) : rea 0.377 0.047 0.11 Volume (cm) : 43.50% 33.40% 45.10% % Reduction in Area: 43.60% 33.80% 45.00% % Reduction in Volume: Full Thickness Without Exposed Full Thickness Without Exposed Full Thickness Without Exposed Classification: Support Structures Support Structures Support Structures Medium Medium Medium Exudate Amount: Serosanguineous Serosanguineous Serous Exudate Type: red, brown red, brown amber Exudate Color: Distinct, outline attached Distinct, outline attached N/A Wound Margin: Medium (34-66%) Medium (34-66%) Medium (34-66%) Granulation Amount: Red, Hyper-granulation Pink N/A Granulation Quality: Medium (34-66%) Medium (34-66%) Medium (34-66%) Necrotic Amount: Fat Layer  (Subcutaneous Tissue): Yes Fat Layer (Subcutaneous Tissue): Yes Fat Layer (Subcutaneous Tissue): Yes Exposed Structures: Fascia: No Fascia: No Fascia: No Tendon: No Tendon: No Tendon: No Muscle: No Muscle: No Muscle: No Joint: No Joint: No Joint: No Bone: No Bone: No Bone: No Small (1-33%) None Small (1-33%) Epithelialization: Debridement - Excisional Debridement - Excisional Debridement - Selective/Open Wound Debridement: KENSLEY, CASCELLA ([id number] Page 4 of 11 14:54 14:54 14:54 Pre-procedure Verification/Time Out Taken: Subcutaneous, Slough Subcutaneous, [employer] Tissue Debrided: Skin/Subcutaneous Tissue Skin/Subcutaneous Tissue Non-Viable Tissue Level: 4.[name] 0.6 1.4 Debridement A (sq cm): rea Curette Curette Curette Instrument: Minimum Minimum Minimum Bleeding: Pressure Pressure Pressure Hemostasis A chieved: Procedure was tolerated well Procedure was tolerated well Procedure was tolerated well Debridement Treatment Response: 2x2.4x0.1 0.5x1.2x0.1 1.4x1x0.1 Post Debridement Measurements L x W x D (cm) 0.377 0.047 0.11 Post Debridement Volume: (cm) Scarring: Yes No Abnormalities Noted No Abnormalities Noted Periwound Skin Texture: No Abnormalities Noted No Abnormalities Noted Dry/Scaly: Yes Periwound Skin Moisture: Rubor: Yes Rubor: Yes Rubor: Yes Periwound Skin Color: No Abnormality No Abnormality No Abnormality Temperature: N/A N/A Yes Tenderness on Palpation: Compression Therapy Compression Therapy Compression Therapy Procedures Performed: Debridement Debridement Debridement Treatment Notes Wound #1 (Achilles) Wound Laterality: Right Cleanser Soap and Water  Discharge Instruction: May shower and wash wound with dial antibacterial soap and water prior to dressing change. Wound Cleanser Discharge Instruction: Cleanse the wound with wound cleanser prior to applying a clean dressing using gauze sponges, not  tissue or cotton balls. Peri-Wound Care Topical Primary Dressing Promogran Prisma Matrix, 4.34 (sq in) (silver collagen) Discharge Instruction: Moisten collagen with hydrogel Secondary Dressing Zetuvit Plus Silicone Border Dressing 4x4 (in/in) Discharge Instruction: Apply silicone border over primary dressing as directed. Secured With Compression Wrap Compression Stockings Add-Ons Wound #2 (Lower Leg) Wound Laterality: Right, Posterior Cleanser Soap and Water Discharge Instruction: May shower and wash wound with dial antibacterial soap and water prior to dressing change. Wound Cleanser Discharge Instruction: Cleanse the wound with wound cleanser prior to applying a clean dressing using gauze sponges, not tissue or cotton balls. Peri-Wound Care Topical Primary Dressing Promogran Prisma Matrix, 4.34 (sq in) (silver collagen) Discharge Instruction: Moisten collagen with hydrogel Secondary Dressing Zetuvit Plus Silicone Border Dressing 4x4 (in/in) Discharge Instruction: Apply silicone border over primary dressing as directed. Secured With Compression Wrap Compression Stockings Add-Ons Wound #3 (Lower Leg) Wound Laterality: Right, Anterior JAYDON, MAROTTE V ([id number]) [id number] Page 5 of 11 Cleanser Soap and Water Discharge Instruction: May shower and wash wound with dial antibacterial soap and water prior to dressing change. Wound Cleanser Discharge Instruction: Cleanse the wound with wound cleanser prior to applying a clean dressing using gauze sponges, not tissue or cotton balls. Peri-Wound Care Zinc Oxide Ointment 30g tube Discharge Instruction: Apply Zinc Oxide to periwound with each dressing change Sween Lotion (Moisturizing lotion) Discharge Instruction: Apply moisturizing lotion as directed Topical Primary Dressing Promogran Prisma Matrix, 4.34 (sq in) (silver collagen) Discharge Instruction: Moisten collagen with hydrogel Secondary  Dressing Zetuvit Plus Silicone Border Dressing 4x4 (in/in) Discharge Instruction: Apply silicone border over primary dressing as directed. Secured With Compression Wrap Compression Stockings Environmental education officer) Signed: 05/01/2022 3:30:12 PM By: Fredirick Maudlin MD FACS Entered By: Fredirick Maudlin on 05/01/2022 15:30:12 -------------------------------------------------------------------------------- Multi-Disciplinary Care Plan Details Patient Name: Date of Service: [name] [name], Nakyia V. 05/01/2022 2:15 PM Medical Record Number: [id number] Patient Account Number: [id number] Date of Birth/Sex: Treating RN: 03-Jan-1946 (77 y.o. [name] [name] Primary Care Paulmichael Schreck: [name] [name] Other Clinician: Referring Xara Paulding: Treating Elliet Goodnow/Extender: [name] [name] in Treatment: [name] Active Inactive Wound/Skin Impairment Nursing Diagnoses: Knowledge deficit related to ulceration/compromised skin integrity Goals: Patient/caregiver will verbalize understanding of skin care regimen Date Initiated: 03/01/2022 Target Resolution Date: 06/02/2022 Goal Status: Active Interventions: Assess ulceration(s) every visit Treatment Activities: Skin care regimen initiated : 03/01/2022 Notes: Electronic Signature(s) Signed: 05/01/2022 4:56:09 PM By: [name] East RN Previous Signature: 05/01/2022 2:52:29 PM Version By: [name] East RN Entered By: [name] [name] on 05/01/2022 15:00:31 [name] [name] ([id number] Page 6 of 11 -------------------------------------------------------------------------------- Pain Assessment Details Patient Name: Date of Service: [name] [name], [name] [name] 05/01/2022 2:15 PM Medical Record Number: [id number] Patient Account Number: [id number] Date of Birth/Sex: Treating RN: 09/23/1945 (77 y.o. [name] [name] Primary Care Philopater Mucha: [name] [name] Other Clinician: Referring Jameel Quant: Treating Doneisha Ivey/Extender: [name] [name] in Treatment: [name] Active Problems Location of Pain Severity and Description of Pain Patient Has Paino No Site Locations Rate the pain. Current Pain Level: 0 Pain Management and Medication Current Pain Management: Electronic Signature(s) Signed: 05/01/2022 4:56:09 PM By: [name] East RN Entered By: [name] [name] on 05/01/2022 14:34:42 -------------------------------------------------------------------------------- Patient/Caregiver Education Details Patient Name: Date of Service: [name] [name] 2/26/2024andnbsp2:15 PM Medical Record Number: [id number] Patient  Account Number: [id number] Date of Birth/Gender: Treating RN: [birth year] (77 y.o. [name] [name] Primary Care Physician: [name] [name] Other Clinician: Referring Physician: Treating Physician/Extender: [name] [name] in Treatment: [name] Education Assessment Education Provided To: Patient Education Topics Provided Wound Debridement: Methods: Explain/Verbal Responses: Reinforcements needed, State content correctly Wound/Skin Impairment: Methods: Explain/Verbal Responses: Reinforcements needed, State content correctly Electronic Signature(s) Signed: 05/01/2022 4:56:09 PM By: [name] East RN Laurance Flatten, Manpreet V ([id number] Page 7 of 11 Signed: 05/01/2022 4:56:09 PM By: [name] East RN Entered By: [name] [name] on 05/01/2022 15:00:49 -------------------------------------------------------------------------------- Wound Assessment Details Patient Name: Date of Service: [name] [name], [name] [name] 05/01/2022 2:15 PM Medical Record Number: [id number] Patient Account Number: [id number] Date of Birth/Sex: Treating RN: Feb 03, 1946 (77 y.o. [name] [name] Primary Care Kenith Trickel: [name] [name] Other Clinician: Referring Xochitl Egle: Treating Armine Rizzolo/Extender: [name] [name] Wound Status Wound Number: 1 Primary Etiology: Abscess Wound Location: Right  Achilles Wound Status: Open Wounding Event: Gradually Appeared Comorbid History: Asthma, Hypertension, Received Radiation Date Acquired: 08/30/2021 Weeks Of Treatment: [name] Clustered Wound: No Photos Wound Measurements Length: (cm) 2 Width: (cm) 2.4 Depth: (cm) 0.1 Area: (cm) 3.77 Volume: (cm) 0.377 % Reduction in Area: 43.5% % Reduction in Volume: 43.6% Epithelialization: Small (1-33%) Tunneling: No Undermining: No Wound Description Classification: Full Thickness Without Exposed Support Structures Wound Margin: Distinct, outline attached Exudate Amount: Medium Exudate Type: Serosanguineous Exudate Color: red, brown Foul Odor After Cleansing: No Slough/Fibrino Yes Wound Bed Granulation Amount: Medium (34-66%) Exposed Structure Granulation Quality: Red, Hyper-granulation Fascia Exposed: No Necrotic Amount: Medium (34-66%) Fat Layer (Subcutaneous Tissue) Exposed: Yes Tendon Exposed: No Muscle Exposed: No Joint Exposed: No Bone Exposed: No Periwound Skin Texture Texture Color No Abnormalities Noted: Yes No Abnormalities Noted: No Rubor: Yes Moisture No Abnormalities Noted: Yes Temperature / Pain Temperature: No Abnormality Treatment Notes Wound #1 (Achilles) Wound Laterality: Right Cleanser Soap and Water Discharge Instruction: May shower and wash wound with dial antibacterial soap and water prior to dressing change. RHENA, OLIVERSON ([id number]) [id number] Page [name] of 11 Wound Cleanser Discharge Instruction: Cleanse the wound with wound cleanser prior to applying a clean dressing using gauze sponges, not tissue or cotton balls. Peri-Wound Care Topical Primary Dressing Promogran Prisma Matrix, 4.34 (sq in) (silver collagen) Discharge Instruction: Moisten collagen with hydrogel Secondary Dressing Zetuvit Plus Silicone Border Dressing 4x4 (in/in) Discharge Instruction: Apply silicone border over primary dressing as directed. Secured  With Compression Wrap Compression Stockings Environmental education officer) Signed: 05/01/2022 4:56:09 PM By: [name] East RN Signed: 05/01/2022 4:59:48 PM By: Dellie Catholic RN Entered By: Dellie Catholic on 05/01/2022 14:42:17 -------------------------------------------------------------------------------- Wound Assessment Details Patient Name: Date of Service: [name] [name], [name] V. 05/01/2022 2:15 PM Medical Record Number: [id number] Patient Account Number: [id number] Date of Birth/Sex: Treating RN: Sep 27, 1945 (77 y.o. [name] [name] Primary Care Jood Retana: [name] [name] Other Clinician: Referring Kennidy Lamke: Treating Rosamae Rocque/Extender: [name] [name] Wound Status Wound Number: 2 Primary Venous Leg Ulcer Etiology: Wound Location: Right, Posterior Lower Leg Wound Open Wounding Event: Shear/Friction Status: Date Acquired: 04/08/2022 Notes: Pt states she started feeling a little sensation under her wrap Weeks Of Treatment: 2 and now has a new place Clustered Wound: No Comorbid Asthma, Hypertension, Received Radiation History: Photos Wound Measurements Length: (cm) 0.5 Width: (cm) 1.2 Depth: (cm) 0.1 Area: (cm) 0.471 Volume: (cm) 0.047 % Reduction in Area: 33.4% % Reduction in Volume: 33.[name]% Epithelialization: None Tunneling: No Undermining: No Wound Description Classification: Full Thickness Without  Exposed Support Structures Wound Margin: Distinct, outline attached Exudate Amount: Medium Exudate Type: Serosanguineous Vidovich, Hazyl V ([id number]) Exudate Color: red, brown Foul Odor After Cleansing: No Slough/Fibrino Yes [id number] Page 9 of 11 Wound Bed Granulation Amount: Medium (34-66%) Exposed Structure Granulation Quality: Pink Fascia Exposed: No Necrotic Amount: Medium (34-66%) Fat Layer (Subcutaneous Tissue) Exposed: Yes Necrotic Quality: Adherent Slough Tendon Exposed: No Muscle Exposed: No Joint  Exposed: No Bone Exposed: No Periwound Skin Texture Texture Color No Abnormalities Noted: Yes No Abnormalities Noted: No Rubor: Yes Moisture No Abnormalities Noted: Yes Temperature / Pain Temperature: No Abnormality Treatment Notes Wound #2 (Lower Leg) Wound Laterality: Right, Posterior Cleanser Soap and Water Discharge Instruction: May shower and wash wound with dial antibacterial soap and water prior to dressing change. Wound Cleanser Discharge Instruction: Cleanse the wound with wound cleanser prior to applying a clean dressing using gauze sponges, not tissue or cotton balls. Peri-Wound Care Topical Primary Dressing Promogran Prisma Matrix, 4.34 (sq in) (silver collagen) Discharge Instruction: Moisten collagen with hydrogel Secondary Dressing Zetuvit Plus Silicone Border Dressing 4x4 (in/in) Discharge Instruction: Apply silicone border over primary dressing as directed. Secured With Compression Wrap Compression Stockings Environmental education officer) Signed: 05/01/2022 4:56:09 PM By: [name] East RN Signed: 05/01/2022 4:59:48 PM By: Dellie Catholic RN Entered By: Dellie Catholic on 05/01/2022 14:43:05 -------------------------------------------------------------------------------- Wound Assessment Details Patient Name: Date of Service: [name] [name], Hillari V. 05/01/2022 2:15 PM Medical Record Number: [id number] Patient Account Number: [id number] Date of Birth/Sex: Treating RN: [name] [name] (77 y.o. [name] [name] Primary Care Christabella Alvira: [name] [name] Other Clinician: Referring Rovena Hearld: Treating Malania Gawthrop/Extender: [name] [name] Wound Status Wound Number: 3 Primary Etiology: Lesion Wound Location: Right, Anterior Lower Leg Wound Status: Open Wounding Event: Other Lesion Comorbid History: Asthma, Hypertension, Received Radiation Date Acquired: 03/09/2022 Weeks Of Treatment: 2 Clustered Wound: No Photos CHARNESSA, METZNER ([id number])  [id number] Page 10 of 11 Wound Measurements Length: (cm) Width: (cm) Depth: (cm) Area: (cm) Volume: (cm) 1.4 % Reduction in Area: 45.1% 1 % Reduction in Volume: 45% 0.1 Epithelialization: Small (1-33%) 1.1 Tunneling: No 0.11 Undermining: No Wound Description Classification: Full Thickness Without Exposed Support Structures Exudate Amount: Medium Exudate Type: Serous Exudate Color: amber Foul Odor After Cleansing: No Slough/Fibrino Yes Wound Bed Granulation Amount: Medium (34-66%) Exposed Structure Necrotic Amount: Medium (34-66%) Fascia Exposed: No Necrotic Quality: Adherent Slough Fat Layer (Subcutaneous Tissue) Exposed: Yes Tendon Exposed: No Muscle Exposed: No Joint Exposed: No Bone Exposed: No Periwound Skin Texture Texture Color No Abnormalities Noted: Yes No Abnormalities Noted: No Rubor: Yes Moisture No Abnormalities Noted: No Temperature / Pain Dry / Scaly: Yes Temperature: No Abnormality Tenderness on Palpation: Yes Treatment Notes Wound #3 (Lower Leg) Wound Laterality: Right, Anterior Cleanser Soap and Water Discharge Instruction: May shower and wash wound with dial antibacterial soap and water prior to dressing change. Wound Cleanser Discharge Instruction: Cleanse the wound with wound cleanser prior to applying a clean dressing using gauze sponges, not tissue or cotton balls. Peri-Wound Care Zinc Oxide Ointment 30g tube Discharge Instruction: Apply Zinc Oxide to periwound with each dressing change Sween Lotion (Moisturizing lotion) Discharge Instruction: Apply moisturizing lotion as directed Topical Primary Dressing Promogran Prisma Matrix, 4.34 (sq in) (silver collagen) Discharge Instruction: Moisten collagen with hydrogel Secondary Dressing Zetuvit Plus Silicone Border Dressing 4x4 (in/in) Discharge Instruction: Apply silicone border over primary dressing as directed. Secured With [employer], [place] V  ([id number]) [id number] Page 11 of 11 Compression Stockings Add-Ons Electronic Signature(s) Signed: 05/01/2022  4:56:09 PM By: [name] East RN Signed: 05/01/2022 4:59:48 PM By: Dellie Catholic RN Entered By: Dellie Catholic on 05/01/2022 14:43:30 -------------------------------------------------------------------------------- Vitals Details Patient Name: Date of Service: [name] [name], Rexanne V. 05/01/2022 2:15 PM Medical Record Number: [id number] Patient Account Number: [id number] Date of Birth/Sex: Treating RN: April 04, 1945 (77 y.o. [name] [name] Primary [facility] Gee Habig: [name] [name] Other Clinician: Referring Dalaysia Harms: Treating Syndey Jaskolski/Extender: [name] [name] in Treatment: [name] Vital Signs Time Taken: 14:28 Temperature (F): 98.1 Height (in): 60 Pulse (bpm): 61 Weight (lbs): 126 Respiratory Rate (breaths/min): 18 Body Mass Index (BMI): 24.6 Blood Pressure (mmHg): 167/70 Reference Range: 80 - 120 mg / dl Electronic Signature(s) Signed: 05/01/2022 4:56:09 PM By: [name] East RN Entered By: [name] [name] on 05/01/2022 14:28:56

## 2022-05-04 ENCOUNTER — Ambulatory Visit (INDEPENDENT_AMBULATORY_CARE_PROVIDER_SITE_OTHER): Payer: Medicare HMO

## 2022-05-04 DIAGNOSIS — T63441D Toxic effect of venom of bees, accidental (unintentional), subsequent encounter: Secondary | ICD-10-CM | POA: Diagnosis not present

## 2022-05-08 ENCOUNTER — Encounter (HOSPITAL_BASED_OUTPATIENT_CLINIC_OR_DEPARTMENT_OTHER): Payer: Medicare HMO | Attending: General Surgery | Admitting: General Surgery

## 2022-05-08 DIAGNOSIS — L988 Other specified disorders of the skin and subcutaneous tissue: Secondary | ICD-10-CM | POA: Diagnosis not present

## 2022-05-08 DIAGNOSIS — L97818 Non-pressure chronic ulcer of other part of right lower leg with other specified severity: Secondary | ICD-10-CM | POA: Insufficient documentation

## 2022-05-08 DIAGNOSIS — Z923 Personal history of irradiation: Secondary | ICD-10-CM | POA: Insufficient documentation

## 2022-05-08 DIAGNOSIS — J45909 Unspecified asthma, uncomplicated: Secondary | ICD-10-CM | POA: Diagnosis not present

## 2022-05-08 DIAGNOSIS — L97812 Non-pressure chronic ulcer of other part of right lower leg with fat layer exposed: Secondary | ICD-10-CM | POA: Insufficient documentation

## 2022-05-08 DIAGNOSIS — I10 Essential (primary) hypertension: Secondary | ICD-10-CM | POA: Insufficient documentation

## 2022-05-08 DIAGNOSIS — E039 Hypothyroidism, unspecified: Secondary | ICD-10-CM | POA: Insufficient documentation

## 2022-05-08 DIAGNOSIS — L97212 Non-pressure chronic ulcer of right calf with fat layer exposed: Secondary | ICD-10-CM | POA: Diagnosis not present

## 2022-05-08 DIAGNOSIS — E785 Hyperlipidemia, unspecified: Secondary | ICD-10-CM | POA: Diagnosis not present

## 2022-05-08 DIAGNOSIS — L02415 Cutaneous abscess of right lower limb: Secondary | ICD-10-CM | POA: Diagnosis not present

## 2022-05-08 DIAGNOSIS — I872 Venous insufficiency (chronic) (peripheral): Secondary | ICD-10-CM | POA: Diagnosis not present

## 2022-05-09 NOTE — Progress Notes (Signed)
[name], [name] ([id number]) [id number] Page 1 of 11 Visit Report for 05/08/2022 Chief Complaint Document Details Patient Name: Date of Service: [name], [name] 05/08/2022 2:15 PM Medical Record Number: [id number] Patient Account Number: [id number] Date of Birth/Sex: Treating RN: [name][birth year] (77 y.o. F) Primary Care Provider: Kathlene [name] Other Clinician: Referring Provider: Treating Provider/Extender: [name] in Treatment: 9 Information Obtained from: Patient Chief Complaint Patient seen for complaints of Non-Healing Wound. Electronic Signature(s) Signed: 05/08/2022 3:19:05 PM By: Misty Maudlin MD FACS Entered By: [name] on 03/[name]/2024 15:19:05 -------------------------------------------------------------------------------- Debridement Details Patient Name: Date of Service: [name], [name] 05/08/2022 2:15 PM Medical Record Number: [id number] Patient Account Number: [id number] Date of Birth/Sex: Treating RN: [name], [name] (77 y.o. [name] Primary Care Provider: Kathlene [name] Other Clinician: Referring Provider: Treating Provider/Extender: [name] in Treatment: 9 Debridement Performed for Assessment: Wound #3 Right,Anterior Lower Leg Performed By: Physician Misty Maudlin, MD Debridement Type: Debridement Level of Consciousness (Pre-procedure): Awake and Alert Pre-procedure Verification/Time Out Yes - 14:49 Taken: Start Time: 14:50 T Area Debrided (L x W): otal 0.9 (cm) x 0.5 (cm) = 0.45 (cm) Tissue and other material debrided: [place] Level: Non-Viable Tissue Debridement Description: Selective/Open Wound Instrument: Curette Bleeding: Minimum Hemostasis Achieved: Pressure Response to Treatment: Procedure was tolerated well Level of Consciousness (Post- Awake and Alert procedure): Post Debridement Measurements of Total Wound Length: (cm) 0.9 Width: (cm) 0.5 Depth: (cm) 0.1 Volume:  (cm) 0.035 Character of Wound/Ulcer Post Debridement: Stable Post Procedure Diagnosis Same as Pre-procedure Notes Scribed for Dr. Celine [name] by Misty East, RN Electronic Signature(s) Signed: 05/08/2022 4:55:03 PM By: Misty East RN Signed: 05/08/2022 5:08:25 PM By: Misty Maudlin MD FACS Entered By: [name] on 03/[name]/2024 14:51:35 [name] ([id number]) [id number] Page 2 of 11 -------------------------------------------------------------------------------- Debridement Details Patient Name: Date of Service: [name], [name] 05/08/2022 2:15 PM Medical Record Number: [id number] Patient Account Number: [id number] Date of Birth/Sex: Treating RN: [name], [name] (77 y.o. [name], [name] Primary Care Provider: Kathlene [name] Other Clinician: Referring Provider: Treating Provider/Extender: [name] in Treatment: 9 Debridement Performed for Assessment: Wound #2 Right,Posterior Lower Leg Performed By: Physician Misty Maudlin, MD Debridement Type: Debridement Severity of Tissue Pre Debridement: Fat layer exposed Level of Consciousness (Pre-procedure): Awake and Alert Pre-procedure Verification/Time Out Yes - 14:49 Taken: Start Time: 14:50 T Area Debrided (L x W): otal 0.5 (cm) x 1.2 (cm) = 0.6 (cm) Tissue and other material debrided: Non-Viable, Slough, Slough Level: Non-Viable Tissue Debridement Description: Selective/Open Wound Instrument: Curette Bleeding: Minimum Hemostasis Achieved: Pressure Response to Treatment: Procedure was tolerated well Level of Consciousness (Post- Awake and Alert procedure): Post Debridement Measurements of Total Wound Length: (cm) 0.5 Width: (cm) 1.2 Depth: (cm) 0.1 Volume: (cm) 0.047 Character of Wound/Ulcer Post Debridement: Stable Severity of Tissue Post Debridement: Fat layer exposed Post Procedure Diagnosis Same as Pre-procedure Notes Scribed for Dr. Celine [name] by Misty East, RN Electronic  Signature(s) Signed: 05/08/2022 4:55:03 PM By: Misty East RN Signed: 05/08/2022 5:08:25 PM By: Misty Maudlin MD FACS Entered By: [name] on 03/[name]/2024 14:52:44 -------------------------------------------------------------------------------- Debridement Details Patient Name: Date of Service: [name], [name] 05/08/2022 2:15 PM Medical Record Number: [id number] Patient Account Number: [id number] Date of Birth/Sex: Treating RN: [birth year], [name] (77 y.o. [name] Primary Care Provider: Kathlene [name] Other Clinician: Referring Provider: Treating Provider/Extender: [name] in Treatment: 9 Debridement Performed for Assessment: Wound #1 Right Achilles Performed By: Physician  Misty Maudlin, MD Debridement Type: Debridement Level of Consciousness (Pre-procedure): Awake and Alert Pre-procedure Verification/Time Out Yes - 14:49 Taken: Start Time: 14:50 T Area Debrided (L x W): otal 1.8 (cm) x 2 (cm) = 3.6 (cm) Tissue and other material debrided: Viable, Slough, Subcutaneous, Slough Level: Skin/Subcutaneous Tissue Debridement Description: Excisional Instrument: Curette Bleeding: Minimum Hemostasis Achieved: Pressure Response to Treatment: Procedure was tolerated well Level of Consciousness (Post- Awake and Alert procedure): [name], [name] ([id number]) [id number] Page 3 of 11 Post Debridement Measurements of Total Wound Length: (cm) 1.8 Width: (cm) 2 Depth: (cm) 0.1 Volume: (cm) 0.283 Character of Wound/Ulcer Post Debridement: Requires Further Debridement Post Procedure Diagnosis Same as Pre-procedure Notes Scribed for Dr. Celine [name] by Misty East, RN Electronic Signature(s) Signed: 05/08/2022 4:55:03 PM By: Misty East RN Signed: 05/08/2022 5:08:25 PM By: Misty Maudlin MD FACS Entered By: [name] on 03/[name]/2024 14:52:58 -------------------------------------------------------------------------------- HPI Details Patient  Name: Date of Service: [name], [name] 05/08/2022 2:15 PM Medical Record Number: [id number] Patient Account Number: [id number] Date of Birth/Sex: Treating RN: 08-[birth year]-[name] (77 y.o. F) Primary Care Provider: Kathlene [name] Other Clinician: Referring Provider: Treating Provider/Extender: [name] in Treatment: 9 History of Present Illness HPI Description: ADMISSION 03/01/2022 This is a 77 year old otherwise fairly healthy woman (not diabetic, not obese, does not smoke) who presents to the clinic with an ulcer on her right posterior leg. She has a history of a liposarcoma excised in 1973. The patient is not sure but believes she received radiation therapy to the location. She did have a skin graft. About 6 months ago, she developed a small ulcer in the area that seemed to start as an abscess. She has had an open wound in that area since then. A shave biopsy was taken by a dermatologist that showed inflammation without evidence of malignancy. She has been applying Vaseline to the site along with periwound TCA. ABI in clinic today is 0.94. Due to the failure of the wound to heal properly, she has been referred to the wound care center for further evaluation and management. 03/09/2022: The tissue on the wound is protruding further from the skin surface. It does not have a typical appearance consistent with hypertrophic granulation tissue. I am concerned that it may represent malignancy. 03/17/2022: Fortunately, the biopsy that I took last week was negative for malignancy. It returned as consistent with an ulcer and inflamed granulation tissue. She still has hypertrophic granulation tissue at the site and some slough accumulation on the surface. No significant change in the wound dimensions. 03/23/2022: The wound measured smaller today and the hypertrophic granulation tissue is less prominent. Due to the unusual shape of her leg from old surgery, however, there has been some friction from  her compressive wrap. 03/31/2022: The wound is slightly smaller today. There is light slough on the surface. There is still some hypertrophic granulation tissue present. 04/12/2022: She has a new wound near the top of her surgical defect. Where the skin rolls over, there has been some friction and abrasion that has resulted in tissue breakdown. There is slough buildup on the surface. The original wound is smaller and there is epithelialization around the borders. There is slough buildup on the surface again. She has been approved for TheraSkin but it is unclear what her financial responsibility would be at this point. We are still working on determining that. 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 04/17/2022: Both posterior leg  wounds are smaller today. They both have slough accumulation. She had her Mohs surgery and there is a defect on her anterior tibial surface. I spoke with her dermatologist and we are going to assume care for this wound. There is a layer of rubbery slough on this wound and it appears a little dry. 04/24/2022: All 3 wounds have a thick layer of rubbery slough on them. There is hypertrophic granulation tissue underneath this on both the anterior tibial surface wound and the posterior Achilles wound. She has a wound on her right forearm that was cultured by her dermatologist and grew out heavy Serratia marcescens and moderate Staph aureus. Apparently they are just having her apply topical mupirocin to the site because she has previously had a reaction to moxifloxacin, which is apparently the antibiotic they wanted to use. She asks if I have any other recommendations. 05/01/2022: All 3 wounds look better this week. The hypertrophic granulation tissue has resolved on her anterior tibia and posterior Achilles wound. The wounds are all smaller. They have some slough accumulation. The moisture balance on the anterior tibial  wound is good; the posterior wounds are a bit dry. 05/08/2022: The anterior tibial wound is smaller without any hypertrophic granulation tissue. There is a little bit of slough buildup. There is more slough on the posterior Achilles wound. The posterior wound under the flap of skin is a little bit macerated. Electronic Signature(s) Signed: 05/08/2022 3:20:47 PM By: Misty Maudlin MD FACS Entered By: [name] on 03/[name]/2024 15:20:47 [name] ([id number]) [id number] Page 4 of 11 -------------------------------------------------------------------------------- Physical Exam Details Patient Name: Date of Service: [name], [name] 05/08/2022 2:15 PM Medical Record Number: [id number] Patient Account Number: [id number] Date of Birth/Sex: Treating RN: [birth year] (77 y.o. F) Primary Care Provider: Kathlene [name] Other Clinician: Referring Provider: Treating Provider/Extender: [name] in Treatment: 9 Constitutional Hypertensive, asymptomatic. . . . no acute distress. Respiratory Normal work of breathing on room air. Notes 05/08/2022: The anterior tibial wound is smaller without any hypertrophic granulation tissue. There is a little bit of slough buildup. There is more slough on the posterior Achilles wound. The posterior wound under the flap of skin is a little bit macerated. Electronic Signature(s) Signed: 05/08/2022 3:21:38 PM By: Misty Maudlin MD FACS Entered By: [name] on 03/[name]/2024 15:21:38 -------------------------------------------------------------------------------- Physician Orders Details Patient Name: Date of Service: [name], Yulieth V. 05/08/2022 2:15 PM Medical Record Number: [id number] Patient Account Number: [id number] Date of Birth/Sex: Treating RN: [name][birth year] (77 y.o. [name], [name] Primary Care Provider: Kathlene [name] Other Clinician: Referring Provider: Treating Provider/Extender: [name] in  Treatment: 9 Verbal / Phone Orders: No Diagnosis Coding ICD-10 Coding Code Description [phone] Non-pressure chronic ulcer of other part of right lower leg with fat layer exposed L97.818 Non-pressure chronic ulcer of other part of right lower leg with other specified severity I10 Essential (primary) hypertension Z85.831 Personal history of malignant neoplasm of soft tissue Z92.3 Personal history of irradiation Follow-up Appointments ppointment in 1 week. - Dr. Celine [name] Room 4 Return A Monday 05/15/22 at 14:15pm Anesthetic (In clinic) Topical Lidocaine 4% applied to wound bed Cellular or Tissue Based Products Cellular or Tissue Based Product Type: - RUN IVR for [employer] or [employer] May shower with protection but do not get wound dressing(s) wet. Protect dressing(s) with water repellant cover (for example, large plastic bag) or a cast cover and may then take shower. - Please do not get leg wraps  wet on Right Leg. May use a cast protector on right leg- can purchase from [employer]; Medical supply store; [facility] etc. the cast protector costs approximately $18-$29 Edema Control - Lymphedema / SCD / Other void standing for long periods of time. - Rest and elevate legs throughout the day. A Moisturize legs daily. - Use a lotion or moisturizer for example :Sween,Eucerin;Aquaphor etc. Wound Treatment Wound #1 - Achilles Wound Laterality: Right Cleanser: Soap and Water 1 x Per Week/30 Days Discharge Instructions: May shower and wash wound with dial antibacterial soap and water prior to dressing change. Cleanser: Wound Cleanser 1 x Per Week/30 Days Discharge Instructions: Cleanse the wound with wound cleanser prior to applying a clean dressing using gauze sponges, not tissue or cotton balls. MAFALDA, WASHA ([id number]) [id number] Page 5 of 11 Prim Dressing: Promogran Prisma Matrix, 4.34 (sq in) (silver collagen) ary 1 x Per Week/30 Days Discharge  Instructions: Moisten collagen with hydrogel Secondary Dressing: Zetuvit Plus Silicone Border Dressing 4x4 (in/in) 1 x Per Week/30 Days Discharge Instructions: Apply silicone border over primary dressing as directed. Wound #2 - Lower Leg Wound Laterality: Right, Posterior Cleanser: Soap and Water 1 x Per Week/30 Days Discharge Instructions: May shower and wash wound with dial antibacterial soap and water prior to dressing change. Cleanser: Wound Cleanser 1 x Per Week/30 Days Discharge Instructions: Cleanse the wound with wound cleanser prior to applying a clean dressing using gauze sponges, not tissue or cotton balls. Peri-Wound Care: Zinc Oxide Ointment 30g tube 1 x Per Week/30 Days Discharge Instructions: Apply Zinc Oxide to periwound with each dressing change Topical: Ketoconazole Cream 2% 1 x Per Week/30 Days Discharge Instructions: Apply Ketoconazole as directed Prim Dressing: Maxorb Extra Calcium Alginate, 2x2 (in/in) 1 x Per Week/30 Days ary Discharge Instructions: Apply to wound bed as instructed Secondary Dressing: Zetuvit Plus Silicone Border Dressing 4x4 (in/in) 1 x Per Week/30 Days Discharge Instructions: Apply silicone border over primary dressing as directed. Wound #3 - Lower Leg Wound Laterality: Right, Anterior Cleanser: Soap and Water 1 x Per Week/30 Days Discharge Instructions: May shower and wash wound with dial antibacterial soap and water prior to dressing change. Cleanser: Wound Cleanser 1 x Per Week/30 Days Discharge Instructions: Cleanse the wound with wound cleanser prior to applying a clean dressing using gauze sponges, not tissue or cotton balls. Peri-Wound Care: Zinc Oxide Ointment 30g tube 1 x Per Week/30 Days Discharge Instructions: Apply Zinc Oxide to periwound with each dressing change Peri-Wound Care: Sween Lotion (Moisturizing lotion) 1 x Per Week/30 Days Discharge Instructions: Apply moisturizing lotion as directed Prim Dressing: Promogran Prisma Matrix,  4.34 (sq in) (silver collagen) ary 1 x Per Week/30 Days Discharge Instructions: Moisten collagen with hydrogel Secondary Dressing: Zetuvit Plus Silicone Border Dressing 4x4 (in/in) 1 x Per Week/30 Days Discharge Instructions: Apply silicone border over primary dressing as directed. Electronic Signature(s) Signed: 05/08/2022 5:08:25 PM By: Misty Maudlin MD FACS Entered By: [name] on 03/[name]/2024 15:21:53 -------------------------------------------------------------------------------- Problem List Details Patient Name: Date of Service: [name], Tameyah V. 05/08/2022 2:15 PM Medical Record Number: [id number] Patient Account Number: [id number] Date of Birth/Sex: Treating RN: 05-09-[name] (77 y.o. F) Primary Care Provider: Kathlene [name] Other Clinician: Referring Provider: Treating Provider/Extender: [name] in Treatment: 9 Active Problems ICD-10 Encounter Code Description Active Date MDM Diagnosis L97.812 Non-pressure chronic ulcer of other part of right lower leg with fat layer 03/01/2022 No Yes exposed L97.818 Non-pressure chronic ulcer of other part of right lower leg with other specified  04/17/2022 No Yes severity KAYLEENA, HEARNE ([id number]) [id number] Page 6 of 11 I10 Essential (primary) hypertension 03/01/2022 No Yes Z85.831 Personal history of malignant neoplasm of soft tissue 03/01/2022 No Yes Z92.3 Personal history of irradiation 03/01/2022 No Yes Inactive Problems Resolved Problems Electronic Signature(s) Signed: 05/08/2022 3:18:45 PM By: Misty Maudlin MD FACS Entered By: [name] on 03/[name]/2024 15:18:45 -------------------------------------------------------------------------------- Progress Note Details Patient Name: Date of Service: [name], [name] 05/08/2022 2:15 PM Medical Record Number: [id number] Patient Account Number: [id number] Date of Birth/Sex: Treating RN: Nov [name], [name] (77 y.o. F) Primary Care Provider:  Kathlene [name] Other Clinician: Referring Provider: Treating Provider/Extender: [name] in Treatment: 9 Subjective Chief Complaint Information obtained from Patient Patient seen for complaints of Non-Healing Wound. History of Present Illness (HPI) ADMISSION 03/01/2022 This is a 77 year old otherwise fairly healthy woman (not diabetic, not obese, does not smoke) who presents to the clinic with an ulcer on her right posterior leg. She has a history of a liposarcoma excised in 1973. The patient is not sure but believes she received radiation therapy to the location. She did have a skin graft. About 6 months ago, she developed a small ulcer in the area that seemed to start as an abscess. She has had an open wound in that area since then. A shave biopsy was taken by a dermatologist that showed inflammation without evidence of malignancy. She has been applying Vaseline to the site along with periwound TCA. ABI in clinic today is 0.94. Due to the failure of the wound to heal properly, she has been referred to the wound care center for further evaluation and management. 03/09/2022: The tissue on the wound is protruding further from the skin surface. It does not have a typical appearance consistent with hypertrophic granulation tissue. I am concerned that it may represent malignancy. 03/17/2022: Fortunately, the biopsy that I took last week was negative for malignancy. It returned as consistent with an ulcer and inflamed granulation tissue. She still has hypertrophic granulation tissue at the site and some slough accumulation on the surface. No significant change in the wound dimensions. 03/23/2022: The wound measured smaller today and the hypertrophic granulation tissue is less prominent. Due to the unusual shape of her leg from old surgery, however, there has been some friction from her compressive wrap. 03/31/2022: The wound is slightly smaller today. There is light slough on the  surface. There is still some hypertrophic granulation tissue present. 04/12/2022: She has a new wound near the top of her surgical defect. Where the skin rolls over, there has been some friction and abrasion that has resulted in tissue breakdown. There is slough buildup on the surface. The original wound is smaller and there is epithelialization around the borders. There is slough buildup on the surface again. She has been approved for TheraSkin but it is unclear what her financial responsibility would be at this point. We are still working on determining that. 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 04/17/2022: Both posterior leg wounds are smaller today. They both have slough accumulation. She had her Mohs surgery and there is a defect on her anterior tibial surface. I spoke with her dermatologist and we are going to assume care for this wound. There is a layer of rubbery slough on this wound and it appears a little dry. 04/24/2022: All 3 wounds have a thick layer of rubbery slough on them. There is hypertrophic granulation tissue underneath this  on both the anterior tibial surface wound and the posterior Achilles wound. She has a wound on her right forearm that was cultured by her dermatologist and grew out heavy Serratia marcescens and moderate Staph aureus. Apparently they are just having her apply topical mupirocin to the site because she has previously had a reaction to moxifloxacin, which is apparently the antibiotic they wanted to use. She asks if I have any other recommendations. 05/01/2022: All 3 wounds look better this week. The hypertrophic granulation tissue has resolved on her anterior tibia and posterior Achilles wound. The wounds are all smaller. They have some slough accumulation. The moisture balance on the anterior tibial wound is good; the posterior wounds are a bit dry. 05/08/2022: The anterior tibial wound is smaller  without any hypertrophic granulation tissue. There is a little bit of slough buildup. There is more slough on the NONNIE, LEBRUN ([id number]) [id number] Page 7 of 11 posterior Achilles wound. The posterior wound under the flap of skin is a little bit macerated. Patient History Information obtained from Patient. Family History Unknown History. Social History Never smoker, Marital Status - Married, Alcohol Use - Never, Drug Use - No History, Caffeine Use - Never. Medical History Respiratory Patient has history of Asthma - Childhood asthma Cardiovascular Patient has history of Hypertension Oncologic Patient has history of Received Radiation - R/T Liposarcoma in 1972 Hospitalization/Surgery History - Left Breast Lumpectomy;Tonsillectomy ; Squamous cell carcinoma; Hysterectomy;Right Leg liposarcoma. 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 no acute distress. Vitals Time Taken: 2:30 PM, Height: 60 in, Weight: 126 lbs, BMI: 24.6, Temperature: 97.9 F, Pulse: 64 bpm, Respiratory Rate: 16 breaths/min, Blood Pressure: 155/60 mmHg. Respiratory Normal work of breathing on room air. General Notes: 05/08/2022: The anterior tibial wound is smaller without any hypertrophic granulation tissue. There is a little bit of slough buildup. There is more slough on the posterior Achilles wound. The posterior wound under the flap of skin is a little bit  macerated. Integumentary (Hair, Skin) Wound #1 status is Open. Original cause of wound was Gradually Appeared. The date acquired was: 08/30/2021. The wound has been in treatment 9 weeks. The wound is located on the Right Achilles. The wound measures 1.8cm length x 2cm width x 0.1cm depth; 2.827cm^2 area and 0.283cm^3 volume. There is Fat Layer (Subcutaneous Tissue) exposed. There is no tunneling or undermining noted. There is a medium amount of serosanguineous drainage noted. The wound margin is distinct with the outline attached to the wound base. There is medium (34-66%) red, hyper - granulation within the wound bed. There is a medium (34- 66%) amount of necrotic tissue within the wound bed. The periwound skin appearance had no abnormalities noted for texture. The periwound skin appearance had no abnormalities noted for moisture. The periwound skin appearance exhibited: Rubor. Periwound temperature was noted as No Abnormality. Wound #2 status is Open. Original cause of wound was Shear/Friction. The date acquired was: 04/08/2022. The wound has been in treatment 3 weeks. The wound is located on the Right,Posterior Lower Leg. The wound measures 0.5cm length x 1.2cm width x 0.1cm depth; 0.471cm^2 area and 0.047cm^3 volume. There is Fat Layer (  Subcutaneous Tissue) exposed. There is no tunneling or undermining noted. There is a medium amount of serosanguineous drainage noted. The wound margin is distinct with the outline attached to the wound base. There is medium (34-66%) pink granulation within the wound bed. There is a medium (34- 66%) amount of necrotic tissue within the wound bed including Adherent Slough. The periwound skin appearance had no abnormalities noted for texture. The periwound skin appearance had no abnormalities noted for moisture. The periwound skin appearance exhibited: Rubor. Periwound temperature was noted as No Abnormality. Wound #3 status is Open. Original cause of wound was Other  Lesion. The date acquired was: 03/09/2022. The wound has been in treatment 3 weeks. The wound is located on the Right,Anterior Lower Leg. The wound measures 0.9cm length x 0.5cm width x 0.1cm depth; 0.353cm^2 area and 0.035cm^3 volume. There is Fat Layer (Subcutaneous Tissue) exposed. There is no tunneling or undermining noted. There is a medium amount of serous drainage noted. There is large (67- 100%) granulation within the wound bed. There is a small (1-33%) amount of necrotic tissue within the wound bed including Adherent Slough. The periwound skin appearance had no abnormalities noted for texture. The periwound skin appearance exhibited: Dry/Scaly, Rubor. Periwound temperature was noted as No Abnormality. The periwound has tenderness on palpation. [name], [name] ([id number]) [id number] Page 8 of 11 Assessment Active Problems ICD-10 Non-pressure chronic ulcer of other part of right lower leg with fat layer exposed Non-pressure chronic ulcer of other part of right lower leg with other specified severity Essential (primary) hypertension Personal history of malignant neoplasm of soft tissue Personal history of irradiation Procedures Wound #1 Pre-procedure diagnosis of Wound #1 is an Abscess located on the Right Achilles . There was a Excisional Skin/Subcutaneous Tissue Debridement with a total area of 3.6 sq cm performed by Misty Maudlin, MD. With the following instrument(s): Curette to remove Viable tissue/material. Material removed includes Subcutaneous Tissue and Slough and. No specimens were taken. A time out was conducted at 14:49, prior to the start of the procedure. A Minimum amount of bleeding was controlled with Pressure. The procedure was tolerated well. Post Debridement Measurements: 1.8cm length x 2cm width x 0.1cm depth; 0.283cm^3 volume. Character of Wound/Ulcer Post Debridement requires further debridement. Post procedure Diagnosis Wound #1: Same as  Pre-Procedure General Notes: Scribed for Dr. Celine [name] by Misty East, RN. Pre-procedure diagnosis of Wound #1 is an Abscess located on the Right Achilles . There was a Three Layer Compression Therapy Procedure by Misty East, RN. Post procedure Diagnosis Wound #1: Same as Pre-Procedure Wound #2 Pre-procedure diagnosis of Wound #2 is a Venous Leg Ulcer located on the Right,Posterior Lower Leg .Severity of Tissue Pre Debridement is: Fat layer exposed. There was a Selective/Open Wound Non-Viable Tissue Debridement with a total area of 0.6 sq cm performed by Misty Maudlin, MD. With the following instrument(s): Curette to remove Non-Viable tissue/material. Material removed includes [facility]. No specimens were taken. A time out was conducted at 14:49, prior to the start of the procedure. A Minimum amount of bleeding was controlled with Pressure. The procedure was tolerated well. Post Debridement Measurements: 0.5cm length x 1.2cm width x 0.1cm depth; 0.047cm^3 volume. Character of Wound/Ulcer Post Debridement is stable. Severity of Tissue Post Debridement is: Fat layer exposed. Post procedure Diagnosis Wound #2: Same as Pre-Procedure General Notes: Scribed for Dr. Celine [name] by Misty East, RN. Pre-procedure diagnosis of Wound #2 is a Venous Leg Ulcer located on the Right,Posterior Lower Leg . There was a Three Layer  Compression Therapy Procedure by Misty East, RN. Post procedure Diagnosis Wound #2: Same as Pre-Procedure Wound #3 Pre-procedure diagnosis of Wound #3 is a Lesion located on the Right,Anterior Lower Leg . There was a Selective/Open Wound Non-Viable Tissue Debridement with a total area of 0.45 sq cm performed by Misty Maudlin, MD. With the following instrument(s): Curette Material removed includes [facility]. No specimens were taken. A time out was conducted at 14:49, prior to the start of the procedure. A Minimum amount of bleeding was controlled with Pressure. The procedure was  tolerated well. Post Debridement Measurements: 0.9cm length x 0.5cm width x 0.1cm depth; 0.035cm^3 volume. Character of Wound/Ulcer Post Debridement is stable. Post procedure Diagnosis Wound #3: Same as Pre-Procedure General Notes: Scribed for Dr. Celine [name] by Misty East, RN. Pre-procedure diagnosis of Wound #3 is a Lesion located on the Right,Anterior Lower Leg . There was a Three Layer Compression Therapy Procedure by Misty East, RN. Post procedure Diagnosis Wound #3: Same as Pre-Procedure Plan Follow-up Appointments: Return Appointment in 1 week. - Dr. Celine [name] Room 4 Monday 05/15/22 at 14:15pm Anesthetic: (In clinic) Topical Lidocaine 4% applied to wound bed Cellular or Tissue Based Products: Cellular or Tissue Based Product Type: - RUN IVR for Theraskin or Epicord Bathing/ Shower/ Hygiene: May shower with protection but do not get wound dressing(s) wet. Protect dressing(s) with water repellant cover (for example, large plastic bag) or a cast cover and may then take shower. - Please do not get leg wraps wet on Right Leg. May use a cast protector on right leg- can purchase from [employer]; Medical supply store; [facility] etc. the cast protector costs approximately $18-$29 Edema Control - Lymphedema / SCD / Other: Avoid standing for long periods of time. - Rest and elevate legs throughout the day. Moisturize legs daily. - Use a lotion or moisturizer for example :Sween,Eucerin;Aquaphor etc. WOUND #1: - Achilles Wound Laterality: Right Cleanser: Soap and Water 1 x Per Week/30 Days Discharge Instructions: May shower and wash wound with dial antibacterial soap and water prior to dressing change. Cleanser: Wound Cleanser 1 x Per Week/30 Days Discharge Instructions: Cleanse the wound with wound cleanser prior to applying a clean dressing using gauze sponges, not tissue or cotton balls. Prim Dressing: Promogran Prisma Matrix, 4.34 (sq in) (silver collagen) 1 x Per Week/30 Days ary Discharge Instructions:  Moisten collagen with hydrogel Secondary Dressing: Zetuvit Plus Silicone Border Dressing 4x4 (in/in) 1 x Per Week/30 Days DAO, MIEDEMA ([id number]) [id number] Page 9 of 11 Discharge Instructions: Apply silicone border over primary dressing as directed. WOUND #2: - Lower Leg Wound Laterality: Right, Posterior Cleanser: Soap and Water 1 x Per Week/30 Days Discharge Instructions: May shower and wash wound with dial antibacterial soap and water prior to dressing change. Cleanser: Wound Cleanser 1 x Per Week/30 Days Discharge Instructions: Cleanse the wound with wound cleanser prior to applying a clean dressing using gauze sponges, not tissue or cotton balls. Peri-Wound Care: Zinc Oxide Ointment 30g tube 1 x Per Week/30 Days Discharge Instructions: Apply Zinc Oxide to periwound with each dressing change Topical: Ketoconazole Cream 2% 1 x Per Week/30 Days Discharge Instructions: Apply Ketoconazole as directed Prim Dressing: Maxorb Extra Calcium Alginate, 2x2 (in/in) 1 x Per Week/30 Days ary Discharge Instructions: Apply to wound bed as instructed Secondary Dressing: Zetuvit Plus Silicone Border Dressing 4x4 (in/in) 1 x Per Week/30 Days Discharge Instructions: Apply silicone border over primary dressing as directed. WOUND #3: - Lower Leg Wound Laterality: Right, Anterior Cleanser: Soap and Water 1 x  Per Week/30 Days Discharge Instructions: May shower and wash wound with dial antibacterial soap and water prior to dressing change. Cleanser: Wound Cleanser 1 x Per Week/30 Days Discharge Instructions: Cleanse the wound with wound cleanser prior to applying a clean dressing using gauze sponges, not tissue or cotton balls. Peri-Wound Care: Zinc Oxide Ointment 30g tube 1 x Per Week/30 Days Discharge Instructions: Apply Zinc Oxide to periwound with each dressing change Peri-Wound Care: Sween Lotion (Moisturizing lotion) 1 x Per Week/30 Days Discharge Instructions: Apply  moisturizing lotion as directed Prim Dressing: Promogran Prisma Matrix, 4.34 (sq in) (silver collagen) 1 x Per Week/30 Days ary Discharge Instructions: Moisten collagen with hydrogel Secondary Dressing: Zetuvit Plus Silicone Border Dressing 4x4 (in/in) 1 x Per Week/30 Days Discharge Instructions: Apply silicone border over primary dressing as directed. 05/08/2022: The anterior tibial wound is smaller without any hypertrophic granulation tissue. There is a little bit of slough buildup. There is more slough on the posterior Achilles wound. The posterior wound under the flap of skin is a little bit macerated. I used a curette to debride slough from the anterior tibial wound and the posterior wound under the flap of skin. I debrided slough and nonviable subcutaneous tissue from the Achilles wound. We will continue Prisma silver collagen to the anterior tibial and posterior Achilles wound, but I want something a little bit more absorbent under the skin flap so we will use silver alginate there. Continue 3 layer compression. Follow-up in 1 week. Electronic Signature(s) Signed: 05/08/2022 3:22:56 PM By: Misty Maudlin MD FACS Entered By: [name] on 03/[name]/2024 15:22:56 -------------------------------------------------------------------------------- HxROS Details Patient Name: Date of Service: [name], Kadedra V. 05/08/2022 2:15 PM Medical Record Number: [id number] Patient Account Number: [id number] Date of Birth/Sex: Treating RN: [name] (77 y.o. 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 Respiratory Medical History: Positive for: Asthma - Childhood asthma Cardiovascular Medical History: Positive for: Hypertension Past Medical History Notes: Hx: Angio-edema r/t Bee  venom; Hyperlipidemia Endocrine Medical History: Past Medical History Notes: Hx: hypothyroidism ADRIENN, SHIFFMAN ([id number]) [id number] Page 10 of 11 Immunological Medical History: Past Medical History Notes: Hx: Currently being treated with Immunotherapy (r/t Bee-Venom) Integumentary (Skin) Medical History: Past Medical History Notes: Hx: Urticaria Musculoskeletal Medical History: Past Medical History Notes: Hx: Osteoporosis Oncologic Medical History: Positive for: Received Radiation - R/T Liposarcoma in 1972 Past Medical History Notes: 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)-Biopsy taken to rule out squamous cell carcinoma on right arm and right leg (02/24/22), still pending results Immunizations Pneumococcal Vaccine: Received Pneumococcal Vaccination: Yes Received Pneumococcal Vaccination On or After 60th Birthday: Yes Implantable Devices No devices added Hospitalization / Surgery History Type of Hospitalization/Surgery Left Breast Lumpectomy;Tonsillectomy ; Squamous cell carcinoma; Hysterectomy;Right Leg liposarcoma Family and Social History Unknown History: Yes; Never smoker; Marital Status - Married; Alcohol Use: Never; Drug Use: No History; Caffeine Use: Never; Financial Concerns: No; Food, Clothing or Shelter Needs: No; Support System Lacking: No; Transportation Concerns: No Electronic Signature(s) Signed: 05/08/2022 5:08:25 PM By: Misty Maudlin MD FACS Entered By: [name] on 03/[name]/2024 15:20:52 -------------------------------------------------------------------------------- SuperBill Details Patient Name: Date of Service: [name], TAKARRA GAGEL 05/08/2022 Medical Record Number: [id number] Patient Account Number: [id number] Date of Birth/Sex: Treating RN: [birth year], [name] (77 y.o. F) Primary Care Provider: Kathlene [name] Other Clinician: Referring Provider: Treating Provider/Extender: Tonette Lederer,  Jose Weeks  in Treatment: 9 Diagnosis Coding ICD-10 Codes Code Description [phone] Non-pressure chronic ulcer of other part of right lower leg with fat layer exposed L97.818 Non-pressure chronic ulcer of other part of right lower leg with other specified severity I10 Essential (primary) hypertension Z85.831 Personal history of malignant neoplasm of soft tissue Z92.3 Personal history of irradiation Facility Procedures : RUMAISA, CASTIGLIONI Code: [id number] UE V ([id number]) ICD Description: [id number] - DEB SUBQ TISSUE 20 SQ CM/< ICD-10 Diagnosis Description [phone] Diagnosis Description L97.812 Non-pressure chronic ulcer of other part of right lower leg with fat layer exposed Modifier: [id number] Quantity: 1 Page 11 of 11 : CPT4 Code: [id number] I Description: [id number] - DEBRIDE WOUND 1ST 20 SQ CM OR < CD-10 Diagnosis Description L97.818 Non-pressure chronic ulcer of other part of right lower leg with other specified sever Modifier: 1 ity Quantity: Physician Procedures : CPT4 Code Description Modifier [id number] - WC PHYS LEVEL 3 - EST PT 25 ICD-10 Diagnosis Description L97.812 Non-pressure chronic ulcer of other part of right lower leg with fat layer exposed L97.818 Non-pressure chronic ulcer of other part of right  lower leg with other specified severity Z85.831 Personal history of malignant neoplasm of soft tissue Z92.3 Personal history of irradiation Quantity: 1 : [id number] - WC PHYS SUBQ TISS 20 SQ CM ICD-10 Diagnosis Description [id number] Non-pressure chronic ulcer of other part of right lower leg with fat layer exposed Quantity: 1 : [id number] - WC PHYS DEBR WO ANESTH 20 SQ CM ICD-10 Diagnosis Description L97.818 Non-pressure chronic ulcer of other part of right lower leg with other specified severity Quantity: 1 Electronic Signature(s) Signed: 05/08/2022 3:23:30 PM By: Misty Maudlin MD FACS Entered By: [name] on 03/[name]/2024 15:23:30

## 2022-05-09 NOTE — Progress Notes (Signed)
[name], [name] ([name]) [id number] Page 1 of 10 Visit Report for 05/08/2022 Arrival Information Details Patient Name: Date of Service: [name] 05/08/2022 2:15 PM Medical Record Number: [name] Patient Account Number: [id number] Date of Birth/Sex: Treating RN: March 11, 1945 (77 y.o. [name], [name] Primary Care Malea Swilling: [name] Other Clinician: Referring Jaylea Plourde: Treating [name]/Extender: [name] in Treatment: 9 Visit Information History Since Last Visit Added or deleted any medications: No Patient Arrived: Ambulatory Any new allergies or adverse reactions: No Arrival Time: 14:23 Had a fall or experienced change in No Accompanied By: self activities of daily living that may affect Transfer Assistance: None risk of falls: Patient Identification Verified: Yes Signs or symptoms of abuse/neglect since last visito No Secondary Verification Process Completed: Yes Hospitalized since last visit: No Patient Requires Transmission-Based Precautions: No Implantable device outside of the clinic excluding No cellular tissue based products placed in the center since last visit: Has Compression in Place as Prescribed: Yes Pain Present Now: Yes Electronic Signature(s) Signed: 05/08/2022 4:55:03 PM By: [name] RN Entered By: [name] on 05/08/2022 14:23:23 -------------------------------------------------------------------------------- Compression Therapy Details Patient Name: Date of Service: [name], [name] 05/08/2022 2:15 PM Medical Record Number: [name] Patient Account Number: [id number] Date of Birth/Sex: Treating RN: [birth year] (77 y.o. [name] Primary Care Schuyler Behan: [name] Other Clinician: Referring Marlaysia Lenig: Treating Jordann Grime/Extender: [name] in Treatment: 9 Compression Therapy Performed for Wound Assessment: Wound #1 Right Achilles Performed By: Clinician [name],  RN Compression Type: Three Layer Post Procedure Diagnosis Same as Pre-procedure Electronic Signature(s) Signed: 05/08/2022 4:55:03 PM By: [name] RN Entered By: [name] on 05/08/2022 14:53:27 -------------------------------------------------------------------------------- Compression Therapy Details Patient Name: Date of Service: [name], [name] 05/08/2022 2:15 PM Medical Record Number: [name] Patient Account Number: [id number] Date of Birth/Sex: Treating RN: [birth year] (77 y.o. [name] Primary Care Kitt Ledet: [name] Other Clinician: Referring Rosemarie Galvis: Treating Wacey Zieger/Extender: [name] in Treatment: 9 Compression Therapy Performed for Wound Assessment: Wound #2 Right,Posterior Lower Leg Performed By: Clinician [name], RN Compression Type: Three Layer Post Procedure Diagnosis Same as Pre-procedure [name] ([name]) [id number] Page 2 of 10 Electronic Signature(s) Signed: 05/08/2022 4:55:03 PM By: [name] RN Entered By: [name] on 05/08/2022 14:53:27 -------------------------------------------------------------------------------- Compression Therapy Details Patient Name: Date of Service: [name], [name] 05/08/2022 2:15 PM Medical Record Number: [name] Patient Account Number: [id number] Date of Birth/Sex: Treating RN: 1945-09-21 (77 y.o. [name] Primary Care Orabelle Rylee: [name] Other Clinician: Referring Luqman Perrelli: Treating Dereon Williamsen/Extender: [name] in Treatment: 9 Compression Therapy Performed for Wound Assessment: Wound #3 Right,Anterior Lower Leg Performed By: Clinician [name], RN Compression Type: Three Layer Post Procedure Diagnosis Same as Pre-procedure Electronic Signature(s) Signed: 05/08/2022 4:55:03 PM By: [name] RN Entered By: [name] on 05/08/2022  14:53:27 -------------------------------------------------------------------------------- Encounter Discharge Information Details Patient Name: Date of Service: [name], [name] 05/08/2022 2:15 PM Medical Record Number: [name] Patient Account Number: [id number] Date of Birth/Sex: Treating RN: November 20, 1945 (77 y.o. [name] Primary Care Mubarak Bevens: [name] Other Clinician: Referring Saharra Santo: Treating Carinne Brandenburger/Extender: [name] in Treatment: 9 Encounter Discharge Information Items Post Procedure Vitals Discharge Condition: Stable Temperature (F): 97.9 Ambulatory Status: Ambulatory Pulse (bpm): 64 Discharge Destination: Home Respiratory Rate (breaths/min): 16 Transportation: Private Auto Blood Pressure (mmHg): 155/60 Accompanied By: self Schedule Follow-up Appointment: Yes Clinical Summary of Care: Electronic Signature(s)  Signed: 05/08/2022 4:46:45 PM By: [name] RN Entered By: [name] on 05/08/2022 16:46:44 -------------------------------------------------------------------------------- Lower Extremity Assessment Details Patient Name: Date of Service: [name], [name] 05/08/2022 2:15 PM Medical Record Number: [name] Patient Account Number: [id number] Date of Birth/Sex: Treating RN: 1945-09-17 (77 y.o. [name] Primary Care Jarielys Girardot: [name] Other Clinician: Referring Ariam Mol: Treating Jamori Biggar/Extender: Otelia Sergeant Weeks in Treatment: 9 Edema Assessment Assessed: [Left: No] [Right: No] [Left: Edema] [Right: :] Calf Left: Right: Point of Measurement: 33 cm From Medial Instep 32 cm [name] ([name]) [phone].pdf Page 3 of 10 Ankle Left: Right: Point of Measurement: 9 cm From Medial Instep 19 cm Vascular Assessment Pulses: Dorsalis Pedis Palpable: [Right:Yes] Electronic Signature(s) Signed: 05/08/2022 4:55:03 PM By: [name] RN Entered By: [name] on 05/08/2022  14:33:22 -------------------------------------------------------------------------------- Multi Wound Chart Details Patient Name: Date of Service: [name], [name] 05/08/2022 2:15 PM Medical Record Number: [name] Patient Account Number: [id number] Date of Birth/Sex: Treating RN: 1945/08/26 (77 y.o. F) Primary Care Journee Bobrowski: [name] Other Clinician: Referring Dallis Czaja: Treating Artem Bunte/Extender: [name] in Treatment: 9 Vital Signs Height(in): 60 Pulse(bpm): 74 Weight(lbs): 126 Blood Pressure(mmHg): 155/60 Body Mass Index(BMI): 24.6 Temperature(F): 97.9 Respiratory Rate(breaths/min): 16 [1:Photos:] Right Achilles Right, Posterior Lower Leg Right, Anterior Lower Leg Wound Location: Gradually Appeared Shear/Friction Other Lesion Wounding Event: Abscess Venous Leg Ulcer Lesion Primary Etiology: Asthma, Hypertension, Received Asthma, Hypertension, Received Asthma, Hypertension, Received Comorbid History: Radiation Radiation Radiation 08/30/2021 04/08/2022 03/09/2022 Date Acquired: '9 3 3 '$ Weeks of Treatment: Open Open Open Wound Status: No No No Wound Recurrence: 1.8x2x0.1 0.5x1.2x0.1 0.9x0.5x0.1 Measurements L x W x D (cm) 2.827 0.471 0.353 A (cm) : rea 0.283 0.047 0.035 Volume (cm) : 57.70% 33.40% 82.40% % Reduction in Area: 57.60% 33.80% 82.50% % Reduction in Volume: Full Thickness Without Exposed Full Thickness Without Exposed Full Thickness Without Exposed Classification: Support Structures Support Structures Support Structures Medium Medium Medium Exudate Amount: Serosanguineous Serosanguineous Serous Exudate Type: red, brown red, brown amber Exudate Color: Distinct, outline attached Distinct, outline attached N/A Wound Margin: Medium (34-66%) Medium (34-66%) Large (67-100%) Granulation Amount: Red, Hyper-granulation Pink N/A Granulation Quality: Medium (34-66%) Medium (34-66%) Small (1-33%) Necrotic Amount: Fat Layer  (Subcutaneous Tissue): Yes Fat Layer (Subcutaneous Tissue): Yes Fat Layer (Subcutaneous Tissue): Yes Exposed Structures: Fascia: No Fascia: No Fascia: No Tendon: No Tendon: No Tendon: No Muscle: No Muscle: No Muscle: No Joint: No Joint: No Joint: No Bone: No Bone: No Bone: No Small (1-33%) Small (1-33%) Small (1-33%) Epithelialization: Debridement - Excisional Debridement - Selective/Open Wound Debridement - Selective/Open Wound Debridement: ZARETH, REZNICEK ([name]) [id number] Page 4 of 10 14:49 14:49 14:49 Pre-procedure Verification/Time Out Taken: Subcutaneous, [facility] Tissue Debrided: Skin/Subcutaneous Tissue Non-Viable Tissue Non-Viable Tissue Level: 3.6 0.6 0.45 Debridement A (sq cm): rea Curette Curette Curette Instrument: Minimum Minimum Minimum Bleeding: Pressure Pressure Pressure Hemostasis A chieved: Procedure was tolerated well Procedure was tolerated well Procedure was tolerated well Debridement Treatment Response: 1.8x2x0.1 0.5x1.2x0.1 0.9x0.5x0.1 Post Debridement Measurements L x W x D (cm) 0.283 0.047 0.035 Post Debridement Volume: (cm) Scarring: Yes No Abnormalities Noted No Abnormalities Noted Periwound Skin Texture: No Abnormalities Noted No Abnormalities Noted Dry/Scaly: Yes Periwound Skin Moisture: Rubor: Yes Rubor: Yes Rubor: Yes Periwound Skin Color: No Abnormality No Abnormality No Abnormality Temperature: N/A N/A Yes Tenderness on Palpation: Compression Therapy Compression Therapy Compression Therapy Procedures Performed: Debridement Debridement Debridement Treatment Notes Electronic Signature(s) Signed: 05/08/2022 3:18:59 PM By: Fredirick Maudlin  MD FACS Entered By: Fredirick Maudlin on 05/08/2022 15:18:58 -------------------------------------------------------------------------------- Multi-Disciplinary Care Plan Details Patient Name: Date of Service: [name], [name] 05/08/2022 2:15  PM Medical Record Number: [name] Patient Account Number: [id number] Date of Birth/Sex: Treating RN: Apr 05, 1945 (77 y.o. [name] Primary Care Leyton Magoon: [name] Other Clinician: Referring Jayjay Littles: Treating Itzayanna Kaster/Extender: [name] in Treatment: 9 Active Inactive Wound/Skin Impairment Nursing Diagnoses: Knowledge deficit related to ulceration/compromised skin integrity Goals: Patient/caregiver will verbalize understanding of skin care regimen Date Initiated: 03/01/2022 Target Resolution Date: 06/02/2022 Goal Status: Active Interventions: Assess ulceration(s) every visit Treatment Activities: Skin care regimen initiated : 03/01/2022 Notes: Electronic Signature(s) Signed: 05/08/2022 4:55:03 PM By: [name] RN Entered By: [name] on 05/08/2022 14:49:48 -------------------------------------------------------------------------------- Pain Assessment Details Patient Name: Date of Service: [name], [name] 05/08/2022 2:15 PM Medical Record Number: [name] Patient Account Number: [id number] Date of Birth/Sex: Treating RN: February 13, 1946 (77 y.o. [name] Primary Care Fleeta Kunde: [name] Other Clinician: Referring Melda Mermelstein: Treating Donzella Carrol/Extender: [name] in Treatment: [address], [place] ([name]) [id number] Page 5 of 10 Active Problems Location of Pain Severity and Description of Pain Patient Has Paino Yes Site Locations Pain Location: Pain in Ulcers Rate the pain. Current Pain Level: 0 Pain Management and Medication Current Pain Management: Electronic Signature(s) Signed: 05/08/2022 4:55:03 PM By: [name] RN Entered By: [name] on 05/08/2022 14:32:38 -------------------------------------------------------------------------------- Patient/Caregiver Education Details Patient Name: Date of Service: Misty Abbott Pao 3/4/2024andnbsp2:15 PM Medical Record Number:  [name] Patient Account Number: [id number] Date of Birth/Gender: Treating RN: 11-Jun-1945 (77 y.o. [name] Primary Care Physician: [name] Other Clinician: Referring Physician: Treating Physician/Extender: [name] in Treatment: 9 Education Assessment Education Provided To: Patient Education Topics Provided Wound Debridement: Methods: Explain/Verbal Responses: Reinforcements needed, State content correctly Wound/Skin Impairment: Responses: Reinforcements needed, State content correctly Electronic Signature(s) Signed: 05/08/2022 4:55:03 PM By: [name] RN Entered By: [name] on 05/08/2022 14:50:33 -------------------------------------------------------------------------------- Wound Assessment Details Patient Name: Date of Service: [name], MARITA LODER 05/08/2022 2:15 PM Medical Record Number: [name] Patient Account Number: [id number] Date of Birth/Sex: Treating RN: [birth year] (77 y.o. [name] Primary Care Kashawn Manzano: [name] Other Clinician: Referring Taimur Fier: Treating Tyrann Donaho/Extender: [name] in Treatment: [address] [place] ([name]) [id number] Page 6 of 10 Wound Status Wound Number: 1 Primary Etiology: Abscess Wound Location: Right Achilles Wound Status: Open Wounding Event: Gradually Appeared Comorbid History: Asthma, Hypertension, Received Radiation Date Acquired: 08/30/2021 Weeks Of Treatment: 9 Clustered Wound: No Photos Wound Measurements Length: (cm) 1.8 Width: (cm) 2 Depth: (cm) 0.1 Area: (cm) 2.827 Volume: (cm) 0.283 % Reduction in Area: 57.7% % Reduction in Volume: 57.6% Epithelialization: Small (1-33%) Tunneling: No Undermining: No Wound Description Classification: Full Thickness Without Exposed Support Structures Wound Margin: Distinct, outline attached Exudate Amount: Medium Exudate Type: Serosanguineous Exudate Color: red, brown Foul Odor  After Cleansing: No Slough/Fibrino Yes Wound Bed Granulation Amount: Medium (34-66%) Exposed Structure Granulation Quality: Red, Hyper-granulation Fascia Exposed: No Necrotic Amount: Medium (34-66%) Fat Layer (Subcutaneous Tissue) Exposed: Yes Tendon Exposed: No Muscle Exposed: No Joint Exposed: No Bone Exposed: No Periwound Skin Texture Texture Color No Abnormalities Noted: Yes No Abnormalities Noted: No Rubor: Yes Moisture No Abnormalities Noted: Yes Temperature / Pain Temperature: No Abnormality Treatment Notes Wound #1 (Achilles) Wound Laterality: Right Cleanser Soap and Water Discharge Instruction: May shower and wash wound with dial antibacterial soap and water prior to  dressing change. Wound Cleanser Discharge Instruction: Cleanse the wound with wound cleanser prior to applying a clean dressing using gauze sponges, not tissue or cotton balls. Peri-Wound Care Topical Primary Dressing Promogran Prisma Matrix, 4.34 (sq in) (silver collagen) Discharge Instruction: Moisten collagen with hydrogel Secondary Dressing Zetuvit Plus Silicone Border Dressing 4x4 (in/in) KASI, REDDICKS [name] ([name]) [phone].pdf Page 7 of 10 Discharge Instruction: Apply silicone border over primary dressing as directed. Secured With Compression Wrap Compression Stockings Environmental education officer) Signed: 05/08/2022 4:55:03 PM By: [name] RN Entered By: [name] on 05/08/2022 14:39:46 -------------------------------------------------------------------------------- Wound Assessment Details Patient Name: Date of Service: [name], TEAIRRA NEVEU 05/08/2022 2:15 PM Medical Record Number: [name] Patient Account Number: [id number] Date of Birth/Sex: Treating RN: 03/01/1946 (77 y.o. [name], [place] Primary Care Elyssia Strausser: [name] Other Clinician: Referring Yafet Cline: Treating Delta Deshmukh/Extender: Otelia Sergeant Weeks in Treatment: 9 Wound Status Wound  Number: 2 Primary Venous Leg Ulcer Etiology: Wound Location: Right, Posterior Lower Leg Wound Open Wounding Event: Shear/Friction Status: Date Acquired: 04/08/2022 Notes: Pt states she started feeling a little sensation under her wrap Weeks Of Treatment: 3 and now has a new place Clustered Wound: No Comorbid Asthma, Hypertension, Received Radiation History: Photos Wound Measurements Length: (cm) 0.5 Width: (cm) 1.2 Depth: (cm) 0.1 Area: (cm) 0.471 Volume: (cm) 0.047 % Reduction in Area: 33.4% % Reduction in Volume: 33.8% Epithelialization: Small (1-33%) Tunneling: No Undermining: No Wound Description Classification: Full Thickness Without Exposed Support Structures Wound Margin: Distinct, outline attached Exudate Amount: Medium Exudate Type: Serosanguineous Exudate Color: red, brown Foul Odor After Cleansing: No Slough/Fibrino Yes Wound Bed Granulation Amount: Medium (34-66%) Exposed Structure Granulation Quality: Pink Fascia Exposed: No Necrotic Amount: Medium (34-66%) Fat Layer (Subcutaneous Tissue) Exposed: Yes Necrotic Quality: Adherent Slough Tendon Exposed: No Muscle Exposed: No Joint Exposed: No Bone Exposed: No [address] JALAINE, GUARINI [name] ([name]) [id number] Page 8 of 10 No Abnormalities Noted: Yes No Abnormalities Noted: No Rubor: Yes Moisture No Abnormalities Noted: Yes Temperature / Pain Temperature: No Abnormality Treatment Notes Wound #2 (Lower Leg) Wound Laterality: Right, Posterior Cleanser Soap and Water Discharge Instruction: May shower and wash wound with dial antibacterial soap and water prior to dressing change. Wound Cleanser Discharge Instruction: Cleanse the wound with wound cleanser prior to applying a clean dressing using gauze sponges, not tissue or cotton balls. Peri-Wound Care Zinc Oxide Ointment 30g tube Discharge Instruction: Apply Zinc Oxide to periwound with each dressing  change Topical Ketoconazole Cream 2% Discharge Instruction: Apply Ketoconazole as directed Primary Dressing Maxorb Extra Calcium Alginate, 2x2 (in/in) Discharge Instruction: Apply to wound bed as instructed Secondary Dressing Zetuvit Plus Silicone Border Dressing 4x4 (in/in) Discharge Instruction: Apply silicone border over primary dressing as directed. Secured With Compression Wrap Compression Stockings Environmental education officer) Signed: 05/08/2022 4:55:03 PM By: [name] RN Entered By: [name] on 05/08/2022 14:40:13 -------------------------------------------------------------------------------- Wound Assessment Details Patient Name: Date of Service: [name], SKILER GARTH 05/08/2022 2:15 PM Medical Record Number: [name] Patient Account Number: [id number] Date of Birth/Sex: Treating RN: Sep 17, 1945 (77 y.o. [name] Primary Care Jaydrien Wassenaar: [name] Other Clinician: Referring Jahmeek Shirk: Treating Isidor Bromell/Extender: Otelia Sergeant Weeks in Treatment: 9 Wound Status Wound Number: 3 Primary Etiology: Lesion Wound Location: Right, Anterior Lower Leg Wound Status: Open Wounding Event: Other Lesion Comorbid History: Asthma, Hypertension, Received Radiation Date Acquired: 03/09/2022 Weeks Of Treatment: 3 Clustered Wound: No Photos NIKERA, FURER ([name]) [id number] Page 9 of 10 Wound Measurements Length: (cm) 0.9  Width: (cm) 0.5 Depth: (cm) 0.1 Area: (cm) 0.353 Volume: (cm) 0.035 % Reduction in Area: 82.4% % Reduction in Volume: 82.5% Epithelialization: Small (1-33%) Tunneling: No Undermining: No Wound Description Classification: Full Thickness Without Exposed Support Structures Exudate Amount: Medium Exudate Type: Serous Exudate Color: amber Foul Odor After Cleansing: No Slough/Fibrino Yes Wound Bed Granulation Amount: Large (67-100%) Exposed Structure Necrotic Amount: Small (1-33%) Fascia Exposed:  No Necrotic Quality: Adherent Slough Fat Layer (Subcutaneous Tissue) Exposed: Yes Tendon Exposed: No Muscle Exposed: No Joint Exposed: No Bone Exposed: No Periwound Skin Texture Texture Color No Abnormalities Noted: Yes No Abnormalities Noted: No Rubor: Yes Moisture No Abnormalities Noted: No Temperature / Pain Dry / Scaly: Yes Temperature: No Abnormality Tenderness on Palpation: Yes Treatment Notes Wound #3 (Lower Leg) Wound Laterality: Right, Anterior Cleanser Soap and Water Discharge Instruction: May shower and wash wound with dial antibacterial soap and water prior to dressing change. Wound Cleanser Discharge Instruction: Cleanse the wound with wound cleanser prior to applying a clean dressing using gauze sponges, not tissue or cotton balls. Peri-Wound Care Zinc Oxide Ointment 30g tube Discharge Instruction: Apply Zinc Oxide to periwound with each dressing change Sween Lotion (Moisturizing lotion) Discharge Instruction: Apply moisturizing lotion as directed Topical Primary Dressing Promogran Prisma Matrix, 4.34 (sq in) (silver collagen) Discharge Instruction: Moisten collagen with hydrogel Secondary Dressing Zetuvit Plus Silicone Border Dressing 4x4 (in/in) Discharge Instruction: Apply silicone border over primary dressing as directed. Secured With [employer] [name] ([id number]) [id number] Page 10 of 10 Compression Stockings Add-Ons Electronic Signature(s) Signed: 05/08/2022 4:55:03 PM By: [name] RN Entered By: [name] on 05/08/2022 14:40:32 -------------------------------------------------------------------------------- Vitals Details Patient Name: Date of Service: [name], Malita [name]. 05/08/2022 2:15 PM Medical Record Number: [id number] Patient Account Number: [id number] Date of Birth/Sex: Treating RN: 01-Feb-1946 (77 y.o. [name], [name] Primary Care Avionna Bower: [name] Other Clinician: Referring Lillymae Duet: Treating  Kashana Breach/Extender: [name] in Treatment: 9 Vital Signs Time Taken: 14:30 Temperature (F): 97.9 Height (in): 60 Pulse (bpm): 64 Weight (lbs): 126 Respiratory Rate (breaths/min): 16 Body Mass Index (BMI): 24.6 Blood Pressure (mmHg): 155/60 Reference Range: 80 - 120 mg / dl Electronic Signature(s) Signed: 05/08/2022 4:55:03 PM By: [name] RN Entered By: [name] on 05/08/2022 14:32:21

## 2022-05-13 ENCOUNTER — Other Ambulatory Visit: Payer: Self-pay | Admitting: Internal Medicine

## 2022-05-15 ENCOUNTER — Encounter (HOSPITAL_BASED_OUTPATIENT_CLINIC_OR_DEPARTMENT_OTHER): Payer: Medicare HMO | Admitting: General Surgery

## 2022-05-15 DIAGNOSIS — L97212 Non-pressure chronic ulcer of right calf with fat layer exposed: Secondary | ICD-10-CM | POA: Diagnosis not present

## 2022-05-15 DIAGNOSIS — E039 Hypothyroidism, unspecified: Secondary | ICD-10-CM | POA: Diagnosis not present

## 2022-05-15 DIAGNOSIS — I10 Essential (primary) hypertension: Secondary | ICD-10-CM | POA: Diagnosis not present

## 2022-05-15 DIAGNOSIS — L97818 Non-pressure chronic ulcer of other part of right lower leg with other specified severity: Secondary | ICD-10-CM | POA: Diagnosis not present

## 2022-05-15 DIAGNOSIS — E785 Hyperlipidemia, unspecified: Secondary | ICD-10-CM | POA: Diagnosis not present

## 2022-05-15 DIAGNOSIS — J45909 Unspecified asthma, uncomplicated: Secondary | ICD-10-CM | POA: Diagnosis not present

## 2022-05-15 DIAGNOSIS — L02415 Cutaneous abscess of right lower limb: Secondary | ICD-10-CM | POA: Diagnosis not present

## 2022-05-15 DIAGNOSIS — Z923 Personal history of irradiation: Secondary | ICD-10-CM | POA: Diagnosis not present

## 2022-05-15 DIAGNOSIS — L97812 Non-pressure chronic ulcer of other part of right lower leg with fat layer exposed: Secondary | ICD-10-CM | POA: Diagnosis not present

## 2022-05-15 DIAGNOSIS — I872 Venous insufficiency (chronic) (peripheral): Secondary | ICD-10-CM | POA: Diagnosis not present

## 2022-05-16 NOTE — Progress Notes (Signed)
Misty, [name] ([id number]) [id number] Page 1 of 10 Visit Report for 05/15/2022 Chief Complaint Document Details Patient Name: Date of Service: [name], [name] 05/15/2022 2:15 PM Medical Record Number: [id number] Patient Account Number: [id number] Date of Birth/Sex: Treating RN: 1945-06-25 (77 y.o. F) Primary Care Provider: [name] Other Clinician: Referring Provider: Treating Provider/Extender: [name] in Treatment: 10 Information Obtained from: Patient Chief Complaint Patient seen for complaints of Non-Healing Wound. Electronic Signature(s) Signed: 05/15/2022 5:16:15 PM By: Fredirick Maudlin MD FACS Entered By: Fredirick Maudlin on 05/15/2022 17:16:15 -------------------------------------------------------------------------------- Debridement Details Patient Name: Date of Service: MO [name] RE, Oliver V. 05/15/2022 2:15 PM Medical Record Number: [id number] Patient Account Number: [id number] Date of Birth/Sex: Treating RN: Jul 03, 1945 (77 y.o. [name], [name] Primary Care Provider: [name] Other Clinician: Referring Provider: Treating Provider/Extender: [name] in Treatment: 10 Debridement Performed for Assessment: Wound #1 Right Achilles Performed By: Physician Fredirick Maudlin, MD Debridement Type: Debridement Level of Consciousness (Pre-procedure): Awake and Alert Pre-procedure Verification/Time Out Yes - 14:52 Taken: Start Time: 14:53 Pain Control: Lidocaine 5% topical ointment T Area Debrided (L x W): otal 1.5 (cm) x 1.5 (cm) = 2.25 (cm) Tissue and other material debrided: Non-Viable, Slough, Slough Level: Non-Viable Tissue Debridement Description: Selective/Open Wound Instrument: Curette Bleeding: Minimum Hemostasis Achieved: Pressure Response to Treatment: Procedure was tolerated well Level of Consciousness (Post- Awake and Alert procedure): Post Debridement Measurements of Total Wound Length:  (cm) 1.5 Width: (cm) 1.5 Depth: (cm) 0.1 Volume: (cm) 0.177 Character of Wound/Ulcer Post Debridement: Requires Further Debridement Post Procedure Diagnosis Same as Pre-procedure Notes Scribed for Dr. Celine Ahr by [name], RN Electronic Signature(s) Signed: 05/15/2022 4:41:37 PM By: [name] RN Signed: 05/16/2022 7:40:18 AM By: Fredirick Maudlin MD FACS Entered By: [name] on 05/15/2022 14:55:51 [name] ([id number]) [id number] Page 2 of 10 -------------------------------------------------------------------------------- Debridement Details Patient Name: Date of Service: MO Misty, [name] 05/15/2022 2:15 PM Medical Record Number: [id number] Patient Account Number: [id number] Date of Birth/Sex: Treating RN: May 26, 1945 (77 y.o. [name], [name] Primary Care Provider: [name] Other Clinician: Referring Provider: Treating Provider/Extender: [name] in Treatment: 10 Debridement Performed for Assessment: Wound #2 Right,Posterior Lower Leg Performed By: Physician Fredirick Maudlin, MD Debridement Type: Debridement Severity of Tissue Pre Debridement: Fat layer exposed Level of Consciousness (Pre-procedure): Awake and Alert Pre-procedure Verification/Time Out Yes - 14:52 Taken: Start Time: 14:53 Pain Control: Lidocaine 5% topical ointment T Area Debrided (L x W): otal 1 (cm) x 2 (cm) = 2 (cm) Tissue and other material debrided: Non-Viable, Slough, Slough Level: Non-Viable Tissue Debridement Description: Selective/Open Wound Instrument: Curette Bleeding: Minimum Hemostasis Achieved: Pressure Response to Treatment: Procedure was tolerated well Level of Consciousness (Post- Awake and Alert procedure): Post Debridement Measurements of Total Wound Length: (cm) 1 Width: (cm) 2 Depth: (cm) 0.1 Volume: (cm) 0.157 Character of Wound/Ulcer Post Debridement: Requires Further Debridement Severity of Tissue Post  Debridement: Fat layer exposed Post Procedure Diagnosis Same as Pre-procedure Notes Scribed for Dr. Celine Ahr by [name], RN Electronic Signature(s) Signed: 05/15/2022 3:16:33 PM By: [name] RN Signed: 05/16/2022 7:40:18 AM By: Fredirick Maudlin MD FACS Entered By: [name] on 05/15/2022 15:16:33 -------------------------------------------------------------------------------- HPI Details Patient Name: Date of Service: [name], [name] 05/15/2022 2:15 PM Medical Record Number: [id number] Patient Account Number: [id number] Date of Birth/Sex: Treating RN: May 17, 1945 (77 y.o. F) Primary Care Provider: [name] Other Clinician: Referring Provider: Treating Provider/Extender: Tonette Lederer, Ashley Murrain  in Treatment: 10 History of Present Illness HPI Description: ADMISSION 03/01/2022 This is a 77 year old otherwise fairly healthy woman (not diabetic, not obese, does not smoke) who presents to the clinic with an ulcer on her right posterior leg. She has a history of a liposarcoma excised in 1973. The patient is not sure but believes she received radiation therapy to the location. She did have a skin graft. About 6 months ago, she developed a small ulcer in the area that seemed to start as an abscess. She has had an open wound in that area since then. A shave biopsy was taken by a dermatologist that showed inflammation without evidence of malignancy. She has been applying Vaseline to the site along with periwound TCA. ABI in clinic today is 0.94. Due to the failure of the wound to heal properly, she has been referred to the wound care center for further evaluation and management. 03/09/2022: The tissue on the wound is protruding further from the skin surface. It does not have a typical appearance consistent with hypertrophic granulation tissue. I am concerned that it may represent malignancy. 03/17/2022: Fortunately, the biopsy that I took last week was negative for malignancy. It  returned as consistent with an ulcer and inflamed granulation tissue. Misty, [name] ([id number]) [id number] Page 3 of 10 She still has hypertrophic granulation tissue at the site and some slough accumulation on the surface. No significant change in the wound dimensions. 03/23/2022: The wound measured smaller today and the hypertrophic granulation tissue is less prominent. Due to the unusual shape of her leg from old surgery, however, there has been some friction from her compressive wrap. 03/31/2022: The wound is slightly smaller today. There is light slough on the surface. There is still some hypertrophic granulation tissue present. 04/12/2022: She has a new wound near the top of her surgical defect. Where the skin rolls over, there has been some friction and abrasion that has resulted in tissue breakdown. There is slough buildup on the surface. The original wound is smaller and there is epithelialization around the borders. There is slough buildup on the surface again. She has been approved for TheraSkin but it is unclear what her financial responsibility would be at this point. We are still working on determining that. 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 04/17/2022: Both posterior leg wounds are smaller today. They both have slough accumulation. She had her Mohs surgery and there is a defect on her anterior tibial surface. I spoke with her dermatologist and we are going to assume care for this wound. There is a layer of rubbery slough on this wound and it appears a little dry. 04/24/2022: All 3 wounds have a thick layer of rubbery slough on them. There is hypertrophic granulation tissue underneath this on both the anterior tibial surface wound and the posterior Achilles wound. She has a wound on her right forearm that was cultured by her dermatologist and grew out heavy Serratia marcescens and moderate  Staph aureus. Apparently they are just having her apply topical mupirocin to the site because she has previously had a reaction to moxifloxacin, which is apparently the antibiotic they wanted to use. She asks if I have any other recommendations. 05/01/2022: All 3 wounds look better this week. The hypertrophic granulation tissue has resolved on her anterior tibia and posterior Achilles wound. The wounds are all smaller. They have some slough accumulation. The moisture balance on the anterior tibial wound is good;  the posterior wounds are a bit dry. 05/08/2022: The anterior tibial wound is smaller without any hypertrophic granulation tissue. There is a little bit of slough buildup. There is more slough on the posterior Achilles wound. The posterior wound under the flap of skin is a little bit macerated. 05/15/2022: The anterior tibial wound is healed. The posterior Achilles wound has heavy slough buildup. The posterior wound under the flap of skin continues to be macerated with slough accumulation. Electronic Signature(s) Signed: 05/15/2022 5:17:09 PM By: Fredirick Maudlin MD FACS Entered By: Fredirick Maudlin on 05/15/2022 17:17:09 -------------------------------------------------------------------------------- Physical Exam Details Patient Name: Date of Service: MO [name] RE, Niyana V. 05/15/2022 2:15 PM Medical Record Number: [id number] Patient Account Number: [id number] Date of Birth/Sex: Treating RN: 10-23-1945 (77 y.o. F) Primary Care Provider: [name] Other Clinician: Referring Provider: Treating Provider/Extender: [name] in Treatment: 10 Constitutional Hypertensive, asymptomatic. . . . no acute distress. Respiratory Normal work of breathing on room air. Notes 05/15/2022: The anterior tibial wound is healed. The posterior Achilles wound has heavy slough buildup. The posterior wound under the flap of skin continues to be macerated with slough accumulation. Electronic  Signature(s) Signed: 05/15/2022 5:17:47 PM By: Fredirick Maudlin MD FACS Entered By: Fredirick Maudlin on 05/15/2022 17:17:47 -------------------------------------------------------------------------------- Physician Orders Details Patient Name: Date of Service: [name], Safiyya V. 05/15/2022 2:15 PM Medical Record Number: [id number] Patient Account Number: [id number] Date of Birth/Sex: Treating RN: 06-Jul-1945 (77 y.o. [name] Primary Care Provider: [name] Other Clinician: Referring Provider: Treating Provider/Extender: [name] in Treatment: 10 Verbal / Phone Orders: No Diagnosis Coding ICD-10 Coding Code Description [phone] Non-pressure chronic ulcer of other part of right lower leg with fat layer exposed KIRTI, MOYNAHAN ([id number]) [phone].pdf Page 4 of 10 L97.818 Non-pressure chronic ulcer of other part of right lower leg with other specified severity I10 Essential (primary) hypertension Z85.831 Personal history of malignant neoplasm of soft tissue Z92.3 Personal history of irradiation Follow-up Appointments ppointment in 2 weeks. - Dr. Celine Ahr Room 4 Return A Nurse Visit: - return in 1 week for nurse visit Anesthetic (In clinic) Topical Lidocaine 4% applied to wound bed Cellular or Tissue Based Products Cellular or Tissue Based Product Type: - RUN IVR for [employer] or [employer] May shower with protection but do not get wound dressing(s) wet. Protect dressing(s) with water repellant cover (for example, large plastic bag) or a cast cover and may then take shower. - Please do not get leg wraps wet on Right Leg. May use a cast protector on right leg- can purchase from [employer]; Medical supply store; [facility] etc. the cast protector costs approximately $18-$29 Edema Control - Lymphedema / SCD / Other void standing for long periods of time. - Rest and elevate legs throughout the day. A Moisturize legs daily. -  Use a lotion or moisturizer for example :Sween,Eucerin;Aquaphor etc. Wound Treatment Wound #1 - Achilles Wound Laterality: Right Cleanser: Soap and Water 1 x Per Week/30 Days Discharge Instructions: May shower and wash wound with dial antibacterial soap and water prior to dressing change. Cleanser: Wound Cleanser 1 x Per Week/30 Days Discharge Instructions: Cleanse the wound with wound cleanser prior to applying a clean dressing using gauze sponges, not tissue or cotton balls. Prim Dressing: Hydrofera Blue Ready Transfer Foam, 4x5 (in/in) 1 x Per Week/30 Days ary Discharge Instructions: Apply to wound bed as instructed Secondary Dressing: CarboFLEX Odor Control Dressing, 4x4 in 1 x Per Week/30 Days Discharge Instructions:  Apply over primary dressing as directed. Secondary Dressing: Zetuvit Plus Silicone Border Dressing 4x4 (in/in) 1 x Per Week/30 Days Discharge Instructions: Apply silicone border over primary dressing as directed. Wound #2 - Lower Leg Wound Laterality: Right, Posterior Cleanser: Soap and Water 1 x Per Week/30 Days Discharge Instructions: May shower and wash wound with dial antibacterial soap and water prior to dressing change. Cleanser: Wound Cleanser 1 x Per Week/30 Days Discharge Instructions: Cleanse the wound with wound cleanser prior to applying a clean dressing using gauze sponges, not tissue or cotton balls. Peri-Wound Care: Zinc Oxide Ointment 30g tube 1 x Per Week/30 Days Discharge Instructions: Apply Zinc Oxide to periwound with each dressing change Topical: Ketoconazole Cream 2% 1 x Per Week/30 Days Discharge Instructions: Apply Ketoconazole as directed Prim Dressing: Hydrofera Blue Ready Transfer Foam, 4x5 (in/in) 1 x Per Week/30 Days ary Discharge Instructions: Apply to wound bed as instructed Secondary Dressing: CarboFLEX Odor Control Dressing, 4x4 in 1 x Per Week/30 Days Discharge Instructions: Apply over primary dressing as directed. Secondary Dressing:  Zetuvit Plus Silicone Border Dressing 4x4 (in/in) 1 x Per Week/30 Days Discharge Instructions: Apply silicone border over primary dressing as directed. Electronic Signature(s) Signed: 05/16/2022 7:40:18 AM By: Fredirick Maudlin MD FACS Previous Signature: 05/15/2022 4:41:37 PM Version By: [name] RN Previous Signature: 05/15/2022 2:41:39 PM Version By: [name] RN Entered By: Fredirick Maudlin on 05/15/2022 17:18:13 Problem List Details -------------------------------------------------------------------------------- [name] ([id number] Page 5 of 10 Patient Name: Date of Service: MCCALL, DEBERG 05/15/2022 2:15 PM Medical Record Number: [id number] Patient Account Number: [id number] Date of Birth/Sex: Treating RN: December 26, 1945 (77 y.o. F) Primary Care Provider: [name] Other Clinician: Referring Provider: Treating Provider/Extender: Otelia Sergeant Weeks in Treatment: 10 Active Problems ICD-10 Encounter Code Description Active Date MDM Diagnosis L97.812 Non-pressure chronic ulcer of other part of right lower leg with fat layer 03/01/2022 No Yes exposed L97.818 Non-pressure chronic ulcer of other part of right lower leg with other specified 04/17/2022 No Yes severity I10 Essential (primary) hypertension 03/01/2022 No Yes Z85.831 Personal history of malignant neoplasm of soft tissue 03/01/2022 No Yes Z92.3 Personal history of irradiation 03/01/2022 No Yes Inactive Problems Resolved Problems Electronic Signature(s) Signed: 05/15/2022 5:15:59 PM By: Fredirick Maudlin MD FACS Entered By: Fredirick Maudlin on 05/15/2022 17:15:58 -------------------------------------------------------------------------------- Progress Note Details Patient Name: Date of Service: [name], Jenay V. 05/15/2022 2:15 PM Medical Record Number: [id number] Patient Account Number: [id number] Date of Birth/Sex: Treating RN: [birth year] (77 y.o. F) Primary Care  Provider: [name] Other Clinician: Referring Provider: Treating Provider/Extender: [name] in Treatment: 10 Subjective Chief Complaint Information obtained from Patient Patient seen for complaints of Non-Healing Wound. History of Present Illness (HPI) ADMISSION 03/01/2022 This is a 77 year old otherwise fairly healthy woman (not diabetic, not obese, does not smoke) who presents to the clinic with an ulcer on her right posterior leg. She has a history of a liposarcoma excised in 1973. The patient is not sure but believes she received radiation therapy to the location. She did have a skin graft. About 6 months ago, she developed a small ulcer in the area that seemed to start as an abscess. She has had an open wound in that area since then. A shave biopsy was taken by a dermatologist that showed inflammation without evidence of malignancy. She has been applying Vaseline to the site along with periwound TCA. ABI in clinic today is 0.94. Due to the failure of the wound  to heal properly, she has been referred to the wound care center for further evaluation and management. 03/09/2022: The tissue on the wound is protruding further from the skin surface. It does not have a typical appearance consistent with hypertrophic granulation tissue. I am concerned that it may represent malignancy. 03/17/2022: Fortunately, the biopsy that I took last week was negative for malignancy. It returned as consistent with an ulcer and inflamed granulation tissue. She still has hypertrophic granulation tissue at the site and some slough accumulation on the surface. No significant change in the wound dimensions. ORIEL, GAL ([id number]) [id number] Page 6 of 10 03/23/2022: The wound measured smaller today and the hypertrophic granulation tissue is less prominent. Due to the unusual shape of her leg from old surgery, however, there has been some friction from her  compressive wrap. 03/31/2022: The wound is slightly smaller today. There is light slough on the surface. There is still some hypertrophic granulation tissue present. 04/12/2022: She has a new wound near the top of her surgical defect. Where the skin rolls over, there has been some friction and abrasion that has resulted in tissue breakdown. There is slough buildup on the surface. The original wound is smaller and there is epithelialization around the borders. There is slough buildup on the surface again. She has been approved for TheraSkin but it is unclear what her financial responsibility would be at this point. We are still working on determining that. 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 04/17/2022: Both posterior leg wounds are smaller today. They both have slough accumulation. She had her Mohs surgery and there is a defect on her anterior tibial surface. I spoke with her dermatologist and we are going to assume care for this wound. There is a layer of rubbery slough on this wound and it appears a little dry. 04/24/2022: All 3 wounds have a thick layer of rubbery slough on them. There is hypertrophic granulation tissue underneath this on both the anterior tibial surface wound and the posterior Achilles wound. She has a wound on her right forearm that was cultured by her dermatologist and grew out heavy Serratia marcescens and moderate Staph aureus. Apparently they are just having her apply topical mupirocin to the site because she has previously had a reaction to moxifloxacin, which is apparently the antibiotic they wanted to use. She asks if I have any other recommendations. 05/01/2022: All 3 wounds look better this week. The hypertrophic granulation tissue has resolved on her anterior tibia and posterior Achilles wound. The wounds are all smaller. They have some slough accumulation. The moisture balance on the anterior tibial  wound is good; the posterior wounds are a bit dry. 05/08/2022: The anterior tibial wound is smaller without any hypertrophic granulation tissue. There is a little bit of slough buildup. There is more slough on the posterior Achilles wound. The posterior wound under the flap of skin is a little bit macerated. 05/15/2022: The anterior tibial wound is healed. The posterior Achilles wound has heavy slough buildup. The posterior wound under the flap of skin continues to be macerated with slough accumulation. Patient History Information obtained from Patient. Family History Unknown History. Social History Never smoker, Marital Status - Married, Alcohol Use - Never, Drug Use - No History, Caffeine Use - Never. Medical History Respiratory Patient has history of Asthma - Childhood asthma Cardiovascular Patient has history of Hypertension Oncologic Patient has history of Received Radiation - R/T Liposarcoma in 1972  Hospitalization/Surgery History - Left Breast Lumpectomy;Tonsillectomy ; Squamous cell carcinoma; Hysterectomy;Right Leg liposarcoma. 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 no acute distress. Vitals Time Taken: 2:26 PM, Height: 60 in, Weight: 126 lbs, BMI: 24.6, Temperature: 97.4 F, Pulse: 73 bpm, Respiratory Rate: 20 breaths/min, Blood Pressure: 171/80 mmHg. Respiratory Normal work of breathing on room air. General Notes: 05/15/2022: The anterior tibial wound is healed.  The posterior Achilles wound has heavy slough buildup. The posterior wound under the flap of skin continues to be macerated with slough accumulation. LAVONDA, IMDIEKE ([id number]) [id number] Page 7 of 10 Integumentary (Hair, Skin) Wound #1 status is Open. Original cause of wound was Gradually Appeared. The date acquired was: 08/30/2021. The wound has been in treatment 10 weeks. The wound is located on the Right Achilles. The wound measures 1.5cm length x 1.5cm width x 0.1cm depth; 1.767cm^2 area and 0.177cm^3 volume. There is Fat Layer (Subcutaneous Tissue) exposed. There is no tunneling or undermining noted. There is a medium amount of serosanguineous drainage noted. The wound margin is distinct with the outline attached to the wound base. There is small (1-33%) red granulation within the wound bed. There is a large (67-100%) amount of necrotic tissue within the wound bed. The periwound skin appearance had no abnormalities noted for texture. The periwound skin appearance had no abnormalities noted for moisture. The periwound skin appearance did not exhibit: Atrophie Blanche, Cyanosis, Ecchymosis, Hemosiderin Staining, Mottled, Pallor, Rubor, Erythema. Periwound temperature was noted as No Abnormality. Wound #2 status is Open. Original cause of wound was Shear/Friction. The date acquired was: 04/08/2022. The wound has been in treatment 4 weeks. The wound is located on the Right,Posterior Lower Leg. The wound measures 1cm length x 2cm width x 0.1cm depth; 1.571cm^2 area and 0.157cm^3 volume. There is Fat Layer (Subcutaneous Tissue) exposed. There is no tunneling or undermining noted. There is a medium amount of serosanguineous drainage noted. The wound margin is distinct with the outline attached to the wound base. There is large (67-100%) pink granulation within the wound bed. There is a small (1-33%) amount of necrotic tissue within the wound bed including Adherent Slough. The  periwound skin appearance had no abnormalities noted for texture. The periwound skin appearance had no abnormalities noted for moisture. The periwound skin appearance did not exhibit: Atrophie Blanche, Cyanosis, Ecchymosis, Hemosiderin Staining, Mottled, Pallor, Rubor, Erythema. Periwound temperature was noted as No Abnormality. Wound #3 status is Healed - Epithelialized. Original cause of wound was Other Lesion. The date acquired was: 03/09/2022. The wound has been in treatment 4 weeks. The wound is located on the Right,Anterior Lower Leg. The wound measures 0cm length x 0cm width x 0cm depth; 0cm^2 area and 0cm^3 volume. There is no tunneling or undermining noted. There is a none present amount of drainage noted. The wound margin is distinct with the outline attached to the wound base. There is no granulation within the wound bed. There is no necrotic tissue within the wound bed. The periwound skin appearance had no abnormalities noted for texture. The periwound skin appearance did not exhibit: Dry/Scaly, Maceration, Atrophie Blanche, Cyanosis, Ecchymosis, Hemosiderin Staining, Mottled, Pallor, Rubor, Erythema.  Periwound temperature was noted as No Abnormality. The periwound has tenderness on palpation. Assessment Active Problems ICD-10 Non-pressure chronic ulcer of other part of right lower leg with fat layer exposed Non-pressure chronic ulcer of other part of right lower leg with other specified severity Essential (primary) hypertension Personal history of malignant neoplasm of soft tissue Personal history of irradiation Procedures Wound #1 Pre-procedure diagnosis of Wound #1 is an Abscess located on the Right Achilles . There was a Selective/Open Wound Non-Viable Tissue Debridement with a total area of 2.25 sq cm performed by Fredirick Maudlin, MD. With the following instrument(s): Curette to remove Non-Viable tissue/material. Material removed includes [facility] after achieving pain control using  Lidocaine 5% topical ointment. No specimens were taken. A time out was conducted at 14:52, prior to the start of the procedure. A Minimum amount of bleeding was controlled with Pressure. The procedure was tolerated well. Post Debridement Measurements: 1.5cm length x 1.5cm width x 0.1cm depth; 0.177cm^3 volume. Character of Wound/Ulcer Post Debridement requires further debridement. Post procedure Diagnosis Wound #1: Same as Pre-Procedure General Notes: Scribed for Dr. Celine Ahr by [name], RN. Pre-procedure diagnosis of Wound #1 is an Abscess located on the Right Achilles . There was a Three Layer Compression Therapy Procedure by [name], RN. Post procedure Diagnosis Wound #1: Same as Pre-Procedure Wound #2 Pre-procedure diagnosis of Wound #2 is a Venous Leg Ulcer located on the Right,Posterior Lower Leg .Severity of Tissue Pre Debridement is: Fat layer exposed. There was a Selective/Open Wound Non-Viable Tissue Debridement with a total area of 2 sq cm performed by Fredirick Maudlin, MD. With the following instrument(s): Curette to remove Non-Viable tissue/material. Material removed includes [facility] after achieving pain control using Lidocaine 5% topical ointment. No specimens were taken. A time out was conducted at 14:52, prior to the start of the procedure. A Minimum amount of bleeding was controlled with Pressure. The procedure was tolerated well. Post Debridement Measurements: 1cm length x 2cm width x 0.1cm depth; 0.157cm^3 volume. Character of Wound/Ulcer Post Debridement requires further debridement. Severity of Tissue Post Debridement is: Fat layer exposed. Post procedure Diagnosis Wound #2: Same as Pre-Procedure General Notes: Scribed for Dr. Celine Ahr by [name], RN. Pre-procedure diagnosis of Wound #2 is a Venous Leg Ulcer located on the Right,Posterior Lower Leg . There was a Three Layer Compression Therapy Procedure by [name], RN. Post procedure Diagnosis Wound #2: Same  as Pre-Procedure Plan Follow-up Appointments: Return Appointment in 2 weeks. - Dr. Celine Ahr Room 4 Nurse Visit: - return in 1 week for nurse visit Anesthetic: (In clinic) Topical Lidocaine 4% applied to wound bed Cellular or Tissue Based Products: TAJAHNAE, REISDORF ([id number]) [phone].pdf Page 8 of 10 Cellular or Tissue Based Product Type: - RUN IVR for Theraskin or Epicord Bathing/ Shower/ Hygiene: May shower with protection but do not get wound dressing(s) wet. Protect dressing(s) with water repellant cover (for example, large plastic bag) or a cast cover and may then take shower. - Please do not get leg wraps wet on Right Leg. May use a cast protector on right leg- can purchase from [employer]; Medical supply store; [facility] etc. the cast protector costs approximately $18-$29 Edema Control - Lymphedema / SCD / Other: Avoid standing for long periods of time. - Rest and elevate legs throughout the day. Moisturize legs daily. - Use a lotion or moisturizer for example :Sween,Eucerin;Aquaphor etc. WOUND #1: - Achilles Wound Laterality: Right Cleanser: Soap and Water 1 x Per Week/30 Days Discharge Instructions: May shower and wash  wound with dial antibacterial soap and water prior to dressing change. Cleanser: Wound Cleanser 1 x Per Week/30 Days Discharge Instructions: Cleanse the wound with wound cleanser prior to applying a clean dressing using gauze sponges, not tissue or cotton balls. Prim Dressing: Hydrofera Blue Ready Transfer Foam, 4x5 (in/in) 1 x Per Week/30 Days ary Discharge Instructions: Apply to wound bed as instructed Secondary Dressing: CarboFLEX Odor Control Dressing, 4x4 in 1 x Per Week/30 Days Discharge Instructions: Apply over primary dressing as directed. Secondary Dressing: Zetuvit Plus Silicone Border Dressing 4x4 (in/in) 1 x Per Week/30 Days Discharge Instructions: Apply silicone border over primary dressing as directed. WOUND #2: - Lower Leg Wound  Laterality: Right, Posterior Cleanser: Soap and Water 1 x Per Week/30 Days Discharge Instructions: May shower and wash wound with dial antibacterial soap and water prior to dressing change. Cleanser: Wound Cleanser 1 x Per Week/30 Days Discharge Instructions: Cleanse the wound with wound cleanser prior to applying a clean dressing using gauze sponges, not tissue or cotton balls. Peri-Wound Care: Zinc Oxide Ointment 30g tube 1 x Per Week/30 Days Discharge Instructions: Apply Zinc Oxide to periwound with each dressing change Topical: Ketoconazole Cream 2% 1 x Per Week/30 Days Discharge Instructions: Apply Ketoconazole as directed Prim Dressing: Hydrofera Blue Ready Transfer Foam, 4x5 (in/in) 1 x Per Week/30 Days ary Discharge Instructions: Apply to wound bed as instructed Secondary Dressing: CarboFLEX Odor Control Dressing, 4x4 in 1 x Per Week/30 Days Discharge Instructions: Apply over primary dressing as directed. Secondary Dressing: Zetuvit Plus Silicone Border Dressing 4x4 (in/in) 1 x Per Week/30 Days Discharge Instructions: Apply silicone border over primary dressing as directed. 05/15/2022: The anterior tibial wound is healed. The posterior Achilles wound has heavy slough buildup. The posterior wound under the flap of skin continues to be macerated with slough accumulation. I used a curette to debride slough off of both posterior leg wounds. I am going to change her dressing to [facility] Blue classic moistened with wound cleanser. 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 Continue with 3 layer compression. She will have a nurse visit next week to change her dressings and will follow-up with me in 2 weeks. Electronic Signature(s) Signed: 05/15/2022 5:20:26 PM By: Fredirick Maudlin MD FACS Entered By: Fredirick Maudlin on 05/15/2022  17:20:26 -------------------------------------------------------------------------------- HxROS Details Patient Name: Date of Service: [name], Ma V. 05/15/2022 2:15 PM Medical Record Number: [id number] Patient Account Number: [id number] Date of Birth/Sex: Treating RN: 1945/05/31 (77 y.o. 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 Respiratory Medical History: Positive for: Asthma - Childhood asthma Cardiovascular Medical History: Positive for: Hypertension Past Medical History Notes: Hx: Angio-edema r/t Bee venom; Hyperlipidemia NAVI, POLIS ([id number]) [id number] Page 9 of 10 Endocrine Medical History: Past Medical History Notes: Hx: hypothyroidism Immunological Medical History: Past Medical History Notes: Hx: Currently being treated with Immunotherapy (r/t Bee-Venom) Integumentary (Skin) Medical History: Past Medical History Notes: Hx: Urticaria Musculoskeletal Medical History: Past Medical History Notes: Hx: Osteoporosis Oncologic Medical History: Positive for: Received Radiation - R/T Liposarcoma in 1972 Past Medical History Notes: 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)-Biopsy taken to rule out squamous cell carcinoma on right arm and right leg (02/24/22), still pending  results Immunizations Pneumococcal Vaccine: Received Pneumococcal Vaccination: Yes Received Pneumococcal Vaccination On or After 60th Birthday: Yes Implantable Devices No devices added Hospitalization / Surgery History Type of Hospitalization/Surgery Left Breast Lumpectomy;Tonsillectomy ; Squamous cell carcinoma; Hysterectomy;Right Leg liposarcoma Family and Social History Unknown History: Yes; Never smoker;  Marital Status - Married; Alcohol Use: Never; Drug Use: No History; Caffeine Use: Never; Financial Concerns: No; Food, Clothing or Shelter Needs: No; Support System Lacking: No; Transportation Concerns: No Electronic Signature(s) Signed: 05/16/2022 7:40:18 AM By: Fredirick Maudlin MD FACS Entered By: Fredirick Maudlin on 05/15/2022 17:17:15 -------------------------------------------------------------------------------- SuperBill Details Patient Name: Date of Service: [name], Diavion V. 05/15/2022 Medical Record Number: [id number] Patient Account Number: [id number] Date of Birth/Sex: Treating RN: Dec 03, 1945 (77 y.o. F) Primary Care Provider: [name] Other Clinician: Referring Provider: Treating Provider/Extender: Otelia Sergeant Weeks in Treatment: 10 Diagnosis Coding ICD-10 Codes Code Description [phone] Non-pressure chronic ulcer of other part of right lower leg with fat layer exposed L97.818 Non-pressure chronic ulcer of other part of right lower leg with other specified severity I10 Essential (primary) hypertension Z85.831 Personal history of malignant neoplasm of soft tissue ALEXUIS, PERSICO ([id number]) [id number] Page 10 of 10 Z92.3 Personal history of irradiation Facility Procedures : CPT4 Code: [id number] Description: [id number] - DEBRIDE WOUND 1ST 20 SQ CM OR < ICD-10 Diagnosis Description [id number] Non-pressure chronic ulcer of other part of right lower leg with fat layer exposed L97.818 Non-pressure chronic ulcer of other part of right lower leg with other  specified s Modifier: everity Quantity: 1 Physician Procedures : CPT4 Code Description Modifier [id number] - WC PHYS LEVEL 3 - EST PT 25 ICD-10 Diagnosis Description L97.812 Non-pressure chronic ulcer of other part of right lower leg with fat layer exposed L97.818 Non-pressure chronic ulcer of other part of right  lower leg with other specified severity Z85.831 Personal history of malignant  neoplasm of soft tissue Z92.3 Personal history of irradiation Quantity: 1 : [id number] - WC PHYS DEBR WO ANESTH 20 SQ CM ICD-10 Diagnosis Description [id number] Non-pressure chronic ulcer of other part of right lower leg with fat layer exposed L97.818 Non-pressure chronic ulcer of other part of right lower leg with other  specified severity Quantity: 1 Electronic Signature(s) Signed: 05/15/2022 5:20:49 PM By: Fredirick Maudlin MD FACS Entered By: Fredirick Maudlin on 05/15/2022 17:20:49

## 2022-05-17 DIAGNOSIS — Z5189 Encounter for other specified aftercare: Secondary | ICD-10-CM | POA: Diagnosis not present

## 2022-05-17 DIAGNOSIS — Z85828 Personal history of other malignant neoplasm of skin: Secondary | ICD-10-CM | POA: Diagnosis not present

## 2022-05-17 NOTE — Progress Notes (Signed)
GERRE, YOSHINO ([id number]) [id number] Page 1 of 11 Visit Report for 05/15/2022 Arrival Information Details Patient Name: Date of Service: [name], [name] 05/15/2022 2:15 PM Medical Record Number: [id number] Patient Account Number: [id number] Date of Birth/Sex: Treating RN: [birth year] (77 y.o. [name], [name] Primary Care Ameria Sanjurjo: [name] Other Clinician: Referring [name]: Treating [name]/Extender: [name] in Treatment: 10 Visit Information History Since Last Visit Added or deleted any medications: No Patient Arrived: Ambulatory Any new allergies or adverse reactions: No Arrival Time: 14:24 Had a fall or experienced change in No Accompanied By: self activities of daily living that may affect Transfer Assistance: None risk of falls: Patient Identification Verified: Yes Signs or symptoms of abuse/neglect since last visito No Secondary Verification Process Completed: Yes Hospitalized since last visit: No Patient Requires Transmission-Based Precautions: No Implantable device outside of the clinic excluding No Patient Has Alerts: No cellular tissue based products placed in the center since last visit: Has Dressing in Place as Prescribed: Yes Has Compression in Place as Prescribed: Yes Pain Present Now: Yes Electronic Signature(s) Signed: 05/16/2022 4:59:46 PM By: Misty Pilling RN, BSN Entered By: [name] on 05/15/2022 14:25:05 -------------------------------------------------------------------------------- Compression Therapy Details Patient Name: Date of Service: [name], [name] 05/15/2022 2:15 PM Medical Record Number: [id number] Patient Account Number: [id number] Date of Birth/Sex: Treating RN: 01/14/1946 (77 y.o. [name] Primary Care [name]: [name] Other Clinician: Referring Emilio Baylock: Treating [name]/Extender: [name] in Treatment: 10 Compression Therapy Performed for Wound  Assessment: Wound #1 Right Achilles Performed By: Clinician Misty East, RN Compression Type: Three Layer Post Procedure Diagnosis Same as Pre-procedure Electronic Signature(s) Signed: 05/15/2022 3:16:47 PM By: Misty East RN Entered By: [name] on 05/15/2022 15:16:47 -------------------------------------------------------------------------------- Compression Therapy Details Patient Name: Date of Service: [name], [name] 05/15/2022 2:15 PM Medical Record Number: [id number] Patient Account Number: [id number] Date of Birth/Sex: Treating RN: [birth year] (77 y.o. [name] Primary Care Taniqua Issa: [name] Other Clinician: Referring Lenka Zhao: Treating [name]/Extender: [name] in Treatment: 10 Compression Therapy Performed for Wound Assessment: Wound #2 Right,Posterior Lower Leg Performed By: Clinician Misty East, RN Compression Type: Three Layer Post Procedure Diagnosis Same as Pre-procedure [name] [name] ([id number]) [id number] Page 2 of 11 Electronic Signature(s) Signed: 05/15/2022 3:16:47 PM By: Misty East RN Entered By: [name] on 05/15/2022 15:16:47 -------------------------------------------------------------------------------- Encounter Discharge Information Details Patient Name: Date of Service: [name], [name] 05/15/2022 2:15 PM Medical Record Number: [id number] Patient Account Number: [id number] Date of Birth/Sex: Treating RN: [birth year] (77 y.o. [name] Primary Care Trica Usery: [name] Other Clinician: Referring Irbin Fines: Treating [name]/Extender: [name] in Treatment: 10 Encounter Discharge Information Items Post Procedure Vitals Discharge Condition: Stable Temperature (F): 97.4 Ambulatory Status: Ambulatory Pulse (bpm): 73 Discharge Destination: Home Respiratory Rate (breaths/min): 20 Transportation: Private Auto Blood Pressure (mmHg):  171/80 Accompanied By: self Schedule Follow-up Appointment: Yes Clinical Summary of Care: Electronic Signature(s) Signed: 05/15/2022 3:20:14 PM By: Misty East RN Entered By: [name] on 05/15/2022 15:20:14 -------------------------------------------------------------------------------- Lower Extremity Assessment Details Patient Name: Date of Service: Misty [name], [name] 05/15/2022 2:15 PM Medical Record Number: [id number] Patient Account Number: [id number] Date of Birth/Sex: Treating RN: [birth year] (77 y.o. [name] Primary Care Adiel Mcnamara: [name] Other Clinician: Referring [name]: Treating Tully Mcinturff/Extender: [name] Weeks in Treatment: 10 Edema Assessment Assessed: [Left: No] [Right: Yes] Edema: [Left: Ye] [Right: s] Calf Left: Right: Point  of Measurement: 33 cm From Medial Instep 31 cm Ankle Left: Right: Point of Measurement: 9 cm From Medial Instep 20 cm Vascular Assessment Pulses: Dorsalis Pedis Palpable: [Right:Yes] Electronic Signature(s) Signed: 05/16/2022 4:59:46 PM By: Misty Pilling RN, BSN Entered By: [name] on 05/15/2022 14:28:55 Multi Wound Chart Details -------------------------------------------------------------------------------- [name] ([id number]) [id number] Page 3 of 11 Patient Name: Date of Service: Misty [name] 05/15/2022 2:15 PM Medical Record Number: [id number] Patient Account Number: [id number] Date of Birth/Sex: Treating RN: Jun 08, 1945 (77 y.o. F) Primary Care [name]: [name] Other Clinician: Referring Alessandra Sawdey: Treating Leonda Cristo/Extender: [name] in Treatment: 10 Vital Signs Height(in): 60 Pulse(bpm): 10 Weight(lbs): 126 Blood Pressure(mmHg): 171/80 Body Mass Index(BMI): 24.6 Temperature(F): 97.4 Respiratory Rate(breaths/min): 20 Wound Assessments Wound Number: '1 2 3 '$ Photos: No Photos Right Achilles Right, Posterior Lower Leg Right,  Anterior Lower Leg Wound Location: Gradually Appeared Shear/Friction Other Lesion Wounding Event: Abscess Venous Leg Ulcer Lesion Primary Etiology: Asthma, Hypertension, Received Asthma, Hypertension, Received Asthma, Hypertension, Received Comorbid History: Radiation Radiation Radiation 08/30/2021 04/08/2022 03/09/2022 Date Acquired: '10 4 4 '$ Weeks of Treatment: Open Open Healed - Epithelialized Wound Status: No No No Wound Recurrence: 1.5x1.5x0.1 1x2x0.1 0x0x0 Measurements L x W x D (cm) 1.767 1.571 0 A (cm) : rea 0.177 0.157 0 Volume (cm) : 73.50% -122.20% 100.00% % Reduction in A rea: 73.50% -121.10% 100.00% % Reduction in Volume: Full Thickness Without Exposed Full Thickness Without Exposed Full Thickness Without Exposed Classification: Support Structures Support Structures Support Structures Medium Medium None Present Exudate A mount: Serosanguineous Serosanguineous N/A Exudate Type: red, brown red, brown N/A Exudate Color: Distinct, outline attached Distinct, outline attached Distinct, outline attached Wound Margin: Small (1-33%) Large (67-100%) None Present (0%) Granulation A mount: Red Pink N/A Granulation Quality: Large (67-100%) Small (1-33%) None Present (0%) Necrotic A mount: Fat Layer (Subcutaneous Tissue): Yes Fat Layer (Subcutaneous Tissue): Yes Fascia: No Exposed Structures: Fascia: No Fascia: No Fat Layer (Subcutaneous Tissue): No Tendon: No Tendon: No Tendon: No Muscle: No Muscle: No Muscle: No Joint: No Joint: No Joint: No Bone: No Bone: No Bone: No Small (1-33%) Medium (34-66%) None Epithelialization: Debridement - Selective/Open Wound Debridement - Selective/Open Wound N/A Debridement: Pre-procedure Verification/Time Out 14:52 14:52 N/A Taken: Lidocaine 5% topical ointment Lidocaine 5% topical ointment N/A Pain Control: [employer] N/A Tissue Debrided: Non-Viable Tissue Non-Viable Tissue N/A Level: 2.25 2 N/A Debridement A  (sq cm): rea Curette Curette N/A Instrument: Minimum Minimum N/A Bleeding: Pressure Pressure N/A Hemostasis A chieved: Procedure was tolerated well Procedure was tolerated well N/A Debridement Treatment Response: 1.5x1.5x0.1 1x2x0.1 N/A Post Debridement Measurements L x W x D (cm) 0.177 0.157 N/A Post Debridement Volume: (cm) Excoriation: No Excoriation: No Excoriation: No Periwound Skin Texture: Induration: No Induration: No Induration: No Callus: No Callus: No Callus: No Crepitus: No Crepitus: No Crepitus: No Rash: No Rash: No Rash: No Scarring: No Scarring: No Scarring: No Maceration: No Maceration: No Maceration: No Periwound Skin Moisture: Dry/Scaly: No Dry/Scaly: No Dry/Scaly: No Atrophie Misty: No Atrophie Misty: No Atrophie Misty: No Periwound Skin Color: Cyanosis: No Cyanosis: No Cyanosis: No Ecchymosis: No Ecchymosis: No Ecchymosis: No Erythema: No Erythema: No Erythema: No Hemosiderin Staining: No Hemosiderin Staining: No Hemosiderin Staining: No LILLYANNAH, LEHL ([id number]) [phone].pdf Page 4 of 11 Mottled: No Mottled: No Mottled: No Pallor: No Pallor: No Pallor: No Rubor: No Rubor: No Rubor: No No Abnormality No Abnormality No Abnormality Temperature: N/A N/A Yes Tenderness on Palpation: Compression Therapy Compression Therapy N/A  Procedures Performed: Debridement Debridement Treatment Notes Wound #1 (Achilles) Wound Laterality: Right Cleanser Soap and Water Discharge Instruction: May shower and wash wound with dial antibacterial soap and water prior to dressing change. Wound Cleanser Discharge Instruction: Cleanse the wound with wound cleanser prior to applying a clean dressing using gauze sponges, not tissue or cotton balls. Peri-Wound Care Topical Primary Dressing Promogran Prisma Matrix, 4.34 (sq in) (silver collagen) Discharge Instruction: Moisten collagen with hydrogel Secondary  Dressing Zetuvit Plus Silicone Border Dressing 4x4 (in/in) Discharge Instruction: Apply silicone border over primary dressing as directed. Secured With Compression Wrap Compression Stockings Add-Ons Wound #2 (Lower Leg) Wound Laterality: Right, Posterior Cleanser Soap and Water Discharge Instruction: May shower and wash wound with dial antibacterial soap and water prior to dressing change. Wound Cleanser Discharge Instruction: Cleanse the wound with wound cleanser prior to applying a clean dressing using gauze sponges, not tissue or cotton balls. Peri-Wound Care Zinc Oxide Ointment 30g tube Discharge Instruction: Apply Zinc Oxide to periwound with each dressing change Topical Ketoconazole Cream 2% Discharge Instruction: Apply Ketoconazole as directed Primary Dressing Maxorb Extra Calcium Alginate, 2x2 (in/in) Discharge Instruction: Apply to wound bed as instructed Secondary Dressing Zetuvit Plus Silicone Border Dressing 4x4 (in/in) Discharge Instruction: Apply silicone border over primary dressing as directed. Secured With Compression Wrap Compression Stockings Add-Ons Wound #3 (Lower Leg) Wound Laterality: Right, Anterior Cleanser Soap and Water Discharge Instruction: May shower and wash wound with dial antibacterial soap and water prior to dressing change. Wound Cleanser [name], [name] ([id number]) [id number] Page 5 of 11 Discharge Instruction: Cleanse the wound with wound cleanser prior to applying a clean dressing using gauze sponges, not tissue or cotton balls. Peri-Wound Care Zinc Oxide Ointment 30g tube Discharge Instruction: Apply Zinc Oxide to periwound with each dressing change Sween Lotion (Moisturizing lotion) Discharge Instruction: Apply moisturizing lotion as directed Topical Primary Dressing Promogran Prisma Matrix, 4.34 (sq in) (silver collagen) Discharge Instruction: Moisten collagen with hydrogel Secondary Dressing Zetuvit Plus  Silicone Border Dressing 4x4 (in/in) Discharge Instruction: Apply silicone border over primary dressing as directed. Secured With Compression Wrap Compression Stockings Environmental education officer) Signed: 05/15/2022 5:16:08 PM By: Fredirick Maudlin MD FACS Entered By: Fredirick Maudlin on 05/15/2022 17:16:08 -------------------------------------------------------------------------------- Multi-Disciplinary Care Plan Details Patient Name: Date of Service: [name], [name] 05/15/2022 2:15 PM Medical Record Number: [id number] Patient Account Number: [id number] Date of Birth/Sex: Treating RN: 08-15-1945 (77 y.o. [name] Primary Care Ileene Allie: [name] Other Clinician: Referring Edwing Figley: Treating Ari Engelbrecht/Extender: [name] in Treatment: 10 Active Inactive Wound/Skin Impairment Nursing Diagnoses: Knowledge deficit related to ulceration/compromised skin integrity Goals: Patient/caregiver will verbalize understanding of skin care regimen Date Initiated: 03/01/2022 Target Resolution Date: 06/02/2022 Goal Status: Active Interventions: Assess ulceration(s) every visit Treatment Activities: Skin care regimen initiated : 03/01/2022 Notes: Electronic Signature(s) Signed: 05/15/2022 2:41:46 PM By: Misty East RN Entered By: [name] on 05/15/2022 14:41:45 -------------------------------------------------------------------------------- Pain Assessment Details Patient Name: Date of Service: Misty [name], Armenta [name]. 05/15/2022 2:15 PM [name] ([id number]) [id number] Page 6 of 11 Medical Record Number: [id number] Patient Account Number: [id number] Date of Birth/Sex: Treating RN: 29-Jul-1945 (77 y.o. [name] Primary Care Nalu Troublefield: [name] Other Clinician: Referring Marqui Formby: Treating Hadli Vandemark/Extender: [name] in Treatment: 10 Active Problems Location of Pain Severity and Description of  Pain Patient Has Paino Yes Site Locations Rate the pain. Current Pain Level: 2 Pain Management and Medication Current Pain Management: Medication: No Cold Application: No Rest: No  Massage: No Activity: No T.E.N.S.: No Heat Application: No Leg drop or elevation: No Is the Current Pain Management Adequate: Adequate How does your wound impact your activities of daily livingo Sleep: No Bathing: No Appetite: No Relationship With Others: No Bladder Continence: No Emotions: No Bowel Continence: No Work: No Toileting: No Drive: No Dressing: No Hobbies: No Notes per patient like rubbing from compression wrap. Electronic Signature(s) Signed: 05/16/2022 4:59:46 PM By: Misty Pilling RN, BSN Entered By: [name] on 05/15/2022 14:25:57 -------------------------------------------------------------------------------- Patient/Caregiver Education Details Patient Name: Date of Service: Misty Abbott Pao 3/11/2024andnbsp2:15 PM Medical Record Number: [id number] Patient Account Number: [id number] Date of Birth/Gender: Treating RN: 05/18/1945 (77 y.o. [name] Primary Care Physician: [name] Other Clinician: Referring Physician: Treating Physician/Extender: [name] in Treatment: 10 Education Assessment Education Provided To: Patient Education Topics Provided Wound Debridement: Methods: Explain/Verbal [name], [name] ([id number]) [phone].pdf Page 7 of 11 Responses: Reinforcements needed, State content correctly Wound/Skin Impairment: Methods: Explain/Verbal Responses: Reinforcements needed, State content correctly Electronic Signature(s) Signed: 05/15/2022 4:41:37 PM By: Misty East RN Entered By: [name] on 05/15/2022 14:42:12 -------------------------------------------------------------------------------- Wound Assessment Details Patient Name: Date of Service: [place], [name] 05/15/2022 2:15 PM Medical Record  Number: [id number] Patient Account Number: [id number] Date of Birth/Sex: Treating RN: 1946/02/03 (77 y.o. [name], [name] Primary Care Charmel Pronovost: [name] Other Clinician: Referring Neha Waight: Treating Jada Fass/Extender: [name] Weeks in Treatment: 10 Wound Status Wound Number: 1 Primary Etiology: Abscess Wound Location: Right Achilles Wound Status: Open Wounding Event: Gradually Appeared Comorbid History: Asthma, Hypertension, Received Radiation Date Acquired: 08/30/2021 Weeks Of Treatment: 10 Clustered Wound: No Photos Wound Measurements Length: (cm) 1.5 Width: (cm) 1.5 Depth: (cm) 0.1 Area: (cm) 1.767 Volume: (cm) 0.177 % Reduction in Area: 73.5% % Reduction in Volume: 73.5% Epithelialization: Small (1-33%) Tunneling: No Undermining: No Wound Description Classification: Full Thickness Without Exposed Support Structures Wound Margin: Distinct, outline attached Exudate Amount: Medium Exudate Type: Serosanguineous Exudate Color: red, brown Foul Odor After Cleansing: No Slough/Fibrino Yes Wound Bed Granulation Amount: Small (1-33%) Exposed Structure Granulation Quality: Red Fascia Exposed: No Necrotic Amount: Large (67-100%) Fat Layer (Subcutaneous Tissue) Exposed: Yes Tendon Exposed: No Muscle Exposed: No Joint Exposed: No Bone Exposed: No Periwound Skin Texture Texture Color No Abnormalities Noted: Yes No Abnormalities Noted: No Atrophie Misty: No [address] [name], [name] ([id number]) [id number] Page 8 of 11 Cyanosis: No No Abnormalities Noted: Yes Ecchymosis: No Erythema: No Hemosiderin Staining: No Mottled: No Pallor: No Rubor: No Temperature / Pain Temperature: No Abnormality Treatment Notes Wound #1 (Achilles) Wound Laterality: Right Cleanser Soap and Water Discharge Instruction: May shower and wash wound with dial antibacterial soap and water prior to dressing change. Wound Cleanser Discharge  Instruction: Cleanse the wound with wound cleanser prior to applying a clean dressing using gauze sponges, not tissue or cotton balls. Peri-Wound Care Topical Primary Dressing Promogran Prisma Matrix, 4.34 (sq in) (silver collagen) Discharge Instruction: Moisten collagen with hydrogel Secondary Dressing Zetuvit Plus Silicone Border Dressing 4x4 (in/in) Discharge Instruction: Apply silicone border over primary dressing as directed. Secured With Compression Wrap Compression Stockings Environmental education officer) Signed: 05/15/2022 4:41:37 PM By: Misty East RN Signed: 05/16/2022 4:59:46 PM By: Misty Pilling RN, BSN Entered By: [name] on 05/15/2022 14:36:18 -------------------------------------------------------------------------------- Wound Assessment Details Patient Name: Date of Service: [name], [name] 05/15/2022 2:15 PM Medical Record Number: [id number] Patient Account Number: [id number] Date of Birth/Sex: Treating RN: 1945/06/09 (77 y.o. [name], [name] Primary  Care Alexy Bringle: [name] Other Clinician: Referring Radwan Cowley: Treating Angenette Daily/Extender: [name] Weeks in Treatment: 10 Wound Status Wound Number: 2 Primary Venous Leg Ulcer Etiology: Wound Location: Right, Posterior Lower Leg Wound Open Wounding Event: Shear/Friction Status: Date Acquired: 04/08/2022 Notes: Pt states she started feeling a little sensation under her wrap Weeks Of Treatment: 4 and now has a new place Clustered Wound: No Comorbid Asthma, Hypertension, Received Radiation History: Wound Measurements Length: (cm) 1 Width: (cm) 2 Depth: (cm) 0.1 Area: (cm) 1.571 Volume: (cm) 0.157 % Reduction in Area: -122.2% % Reduction in Volume: -121.1% Epithelialization: Medium (34-66%) Tunneling: No Undermining: No Wound Description Classification: Full Thickness Without Exposed Support Structures [name], MAIDONADO [name] ([id number]) Wound Margin: Distinct, outline attached Exudate  Amount: Medium Exudate Type: Serosanguineous Exudate Color: red, brown Foul Odor After Cleansing: No [id number] Page 9 of 11 Slough/Fibrino Yes Wound Bed Granulation Amount: Large (67-100%) Exposed Structure Granulation Quality: Pink Fascia Exposed: No Necrotic Amount: Small (1-33%) Fat Layer (Subcutaneous Tissue) Exposed: Yes Necrotic Quality: Adherent Slough Tendon Exposed: No Muscle Exposed: No Joint Exposed: No Bone Exposed: No Periwound Skin Texture Texture Color No Abnormalities Noted: Yes No Abnormalities Noted: No Atrophie Misty: No Moisture Cyanosis: No No Abnormalities Noted: Yes Ecchymosis: No Erythema: No Hemosiderin Staining: No Mottled: No Pallor: No Rubor: No Temperature / Pain Temperature: No Abnormality Treatment Notes Wound #2 (Lower Leg) Wound Laterality: Right, Posterior Cleanser Soap and Water Discharge Instruction: May shower and wash wound with dial antibacterial soap and water prior to dressing change. Wound Cleanser Discharge Instruction: Cleanse the wound with wound cleanser prior to applying a clean dressing using gauze sponges, not tissue or cotton balls. Peri-Wound Care Zinc Oxide Ointment 30g tube Discharge Instruction: Apply Zinc Oxide to periwound with each dressing change Topical Ketoconazole Cream 2% Discharge Instruction: Apply Ketoconazole as directed Primary Dressing Maxorb Extra Calcium Alginate, 2x2 (in/in) Discharge Instruction: Apply to wound bed as instructed Secondary Dressing Zetuvit Plus Silicone Border Dressing 4x4 (in/in) Discharge Instruction: Apply silicone border over primary dressing as directed. Secured With Compression Wrap Compression Stockings Environmental education officer) Signed: 05/16/2022 4:59:46 PM By: Misty Pilling RN, BSN Entered By: [name] on 05/15/2022 14:34:43 -------------------------------------------------------------------------------- Wound Assessment  Details Patient Name: Date of Service: [name], Laveyah [name]. 05/15/2022 2:15 PM Medical Record Number: [id number] Patient Account Number: [id number] Date of Birth/Sex: Treating RN: 20-Oct-1945 (77 y.o. [address], [address] [place], Edwards [name] ([id number]) [id number] Page 10 of 11 Primary Care Natalee Tomkiewicz: [name] Other Clinician: Referring Hevin Jeffcoat: Treating Mickenzie Stolar/Extender: [name] Weeks in Treatment: 10 Wound Status Wound Number: 3 Primary Etiology: Lesion Wound Location: Right, Anterior Lower Leg Wound Status: Healed - Epithelialized Wounding Event: Other Lesion Comorbid History: Asthma, Hypertension, Received Radiation Date Acquired: 03/09/2022 Weeks Of Treatment: 4 Clustered Wound: No Photos Wound Measurements Length: (cm) Width: (cm) Depth: (cm) Area: (cm) Volume: (cm) 0 % Reduction in Area: 100% 0 % Reduction in Volume: 100% 0 Epithelialization: None 0 Tunneling: No 0 Undermining: No Wound Description Classification: Full Thickness Without Exposed Support Structures Wound Margin: Distinct, outline attached Exudate Amount: None Present Foul Odor After Cleansing: No Slough/Fibrino No Wound Bed Granulation Amount: None Present (0%) Exposed Structure Necrotic Amount: None Present (0%) Fascia Exposed: No Fat Layer (Subcutaneous Tissue) Exposed: No Tendon Exposed: No Muscle Exposed: No Joint Exposed: No Bone Exposed: No Periwound Skin Texture Texture Color No Abnormalities Noted: Yes No Abnormalities Noted: No Atrophie Misty: No Moisture Cyanosis: No No Abnormalities Noted: No Ecchymosis: No Dry / Scaly:  No Erythema: No Maceration: No Hemosiderin Staining: No Mottled: No Pallor: No Rubor: No Temperature / Pain Temperature: No Abnormality Tenderness on Palpation: Yes Treatment Notes Wound #3 (Lower Leg) Wound Laterality: Right, Anterior Cleanser Soap and Water Discharge Instruction: May shower and wash wound with dial  antibacterial soap and water prior to dressing change. Wound Cleanser Discharge Instruction: Cleanse the wound with wound cleanser prior to applying a clean dressing using gauze sponges, not tissue or cotton balls. Peri-Wound Care CEDRICKA, WOLLAN ([id number]) [id number] Page 11 of 11 Zinc Oxide Ointment 30g tube Discharge Instruction: Apply Zinc Oxide to periwound with each dressing change Sween Lotion (Moisturizing lotion) Discharge Instruction: Apply moisturizing lotion as directed Topical Primary Dressing Promogran Prisma Matrix, 4.34 (sq in) (silver collagen) Discharge Instruction: Moisten collagen with hydrogel Secondary Dressing Zetuvit Plus Silicone Border Dressing 4x4 (in/in) Discharge Instruction: Apply silicone border over primary dressing as directed. Secured With Compression Wrap Compression Stockings Environmental education officer) Signed: 05/15/2022 4:41:37 PM By: Misty East RN Signed: 05/16/2022 4:59:46 PM By: Misty Pilling RN, BSN Entered By: [name] on 05/15/2022 14:57:15 -------------------------------------------------------------------------------- Vitals Details Patient Name: Date of Service: Misty [name], Ariyah [name]. 05/15/2022 2:15 PM Medical Record Number: [id number] Patient Account Number: [id number] Date of Birth/Sex: Treating RN: 07-18-1945 (77 y.o. [name], [name] Primary Care Marketia Stallsmith: [name] Other Clinician: Referring Dani Danis: Treating Kaidence Sant/Extender: [name] in Treatment: 10 Vital Signs Time Taken: 14:26 Temperature (F): 97.4 Height (in): 60 Pulse (bpm): 73 Weight (lbs): 126 Respiratory Rate (breaths/min): 20 Body Mass Index (BMI): 24.6 Blood Pressure (mmHg): 171/80 Reference Range: 80 - 120 mg / dl Electronic Signature(s) Signed: 05/16/2022 4:59:46 PM By: Misty Pilling RN, BSN Entered By: [name] on 05/15/2022 14:26:17

## 2022-05-22 ENCOUNTER — Encounter (HOSPITAL_BASED_OUTPATIENT_CLINIC_OR_DEPARTMENT_OTHER): Payer: Medicare HMO | Admitting: Internal Medicine

## 2022-05-22 DIAGNOSIS — L97812 Non-pressure chronic ulcer of other part of right lower leg with fat layer exposed: Secondary | ICD-10-CM | POA: Diagnosis not present

## 2022-05-22 DIAGNOSIS — E039 Hypothyroidism, unspecified: Secondary | ICD-10-CM | POA: Diagnosis not present

## 2022-05-22 DIAGNOSIS — E785 Hyperlipidemia, unspecified: Secondary | ICD-10-CM | POA: Diagnosis not present

## 2022-05-22 DIAGNOSIS — L97818 Non-pressure chronic ulcer of other part of right lower leg with other specified severity: Secondary | ICD-10-CM | POA: Diagnosis not present

## 2022-05-22 DIAGNOSIS — Z923 Personal history of irradiation: Secondary | ICD-10-CM | POA: Diagnosis not present

## 2022-05-22 DIAGNOSIS — J45909 Unspecified asthma, uncomplicated: Secondary | ICD-10-CM | POA: Diagnosis not present

## 2022-05-22 DIAGNOSIS — I10 Essential (primary) hypertension: Secondary | ICD-10-CM | POA: Diagnosis not present

## 2022-05-24 ENCOUNTER — Encounter: Payer: Self-pay | Admitting: Internal Medicine

## 2022-05-25 ENCOUNTER — Other Ambulatory Visit: Payer: Self-pay | Admitting: Internal Medicine

## 2022-05-29 ENCOUNTER — Encounter (HOSPITAL_BASED_OUTPATIENT_CLINIC_OR_DEPARTMENT_OTHER): Payer: Medicare HMO | Admitting: General Surgery

## 2022-05-29 DIAGNOSIS — L02415 Cutaneous abscess of right lower limb: Secondary | ICD-10-CM | POA: Diagnosis not present

## 2022-05-29 DIAGNOSIS — L97818 Non-pressure chronic ulcer of other part of right lower leg with other specified severity: Secondary | ICD-10-CM | POA: Diagnosis not present

## 2022-05-29 DIAGNOSIS — E039 Hypothyroidism, unspecified: Secondary | ICD-10-CM | POA: Diagnosis not present

## 2022-05-29 DIAGNOSIS — Z923 Personal history of irradiation: Secondary | ICD-10-CM | POA: Diagnosis not present

## 2022-05-29 DIAGNOSIS — I10 Essential (primary) hypertension: Secondary | ICD-10-CM | POA: Diagnosis not present

## 2022-05-29 DIAGNOSIS — J45909 Unspecified asthma, uncomplicated: Secondary | ICD-10-CM | POA: Diagnosis not present

## 2022-05-29 DIAGNOSIS — L97212 Non-pressure chronic ulcer of right calf with fat layer exposed: Secondary | ICD-10-CM | POA: Diagnosis not present

## 2022-05-29 DIAGNOSIS — I872 Venous insufficiency (chronic) (peripheral): Secondary | ICD-10-CM | POA: Diagnosis not present

## 2022-05-29 DIAGNOSIS — E785 Hyperlipidemia, unspecified: Secondary | ICD-10-CM | POA: Diagnosis not present

## 2022-05-29 DIAGNOSIS — L97812 Non-pressure chronic ulcer of other part of right lower leg with fat layer exposed: Secondary | ICD-10-CM | POA: Diagnosis not present

## 2022-05-29 NOTE — Progress Notes (Signed)
[name], [name] ([id number]) [id number] Page 1 of 9 Visit Report for 05/29/2022 Arrival Information Details Patient Name: Date of Service: [name], [name] 05/29/2022 2:30 PM Medical Record Number: [id number] Patient Account Number: [id number] Date of Birth/Sex: Treating RN: Feb 15, 1946 (77 y.o. [name] Primary Care Taneesha Edgin: [name] Other Clinician: Referring Fran Mcree: Treating Chenelle Benning/Extender: [name] in Treatment: 12 Visit Information History Since Last Visit Added or deleted any medications: No Patient Arrived: Ambulatory Any new allergies or adverse reactions: No Arrival Time: 14:31 Had a fall or experienced change in No Accompanied By: self activities of daily living that may affect Transfer Assistance: None risk of falls: Patient Identification Verified: Yes Signs or symptoms of abuse/neglect since last visito No Secondary Verification Process Completed: Yes Hospitalized since last visit: No Patient Requires Transmission-Based Precautions: No Implantable device outside of the clinic excluding No Patient Has Alerts: No cellular tissue based products placed in the center since last visit: Has Dressing in Place as Prescribed: Yes Has Compression in Place as Prescribed: Yes Pain Present Now: No Electronic Signature(s) Signed: 05/29/2022 4:39:46 PM By: Adline Peals Entered By: Adline Peals on 05/29/2022 14:35:01 -------------------------------------------------------------------------------- Compression Therapy Details Patient Name: Date of Service: [name], [name] 05/29/2022 2:30 PM Medical Record Number: [id number] Patient Account Number: [id number] Date of Birth/Sex: Treating RN: 1945/11/11 (77 y.o. [name] Primary Care Amarisa Wilinski: [name] Other Clinician: Referring Cache Decoursey: Treating Pola Furno/Extender: [name] in Treatment: 12 Compression Therapy Performed  for Wound Assessment: Wound #1 Right Achilles Performed By: Clinician Adline Peals, RN Compression Type: Double Layer Post Procedure Diagnosis Same as Pre-procedure Electronic Signature(s) Signed: 05/29/2022 4:39:46 PM By: Sabas Sous By: Adline Peals on 05/29/2022 15:00:56 -------------------------------------------------------------------------------- Encounter Discharge Information Details Patient Name: Date of Service: [name], [name] 05/29/2022 2:30 PM Medical Record Number: [id number] Patient Account Number: [id number] Date of Birth/Sex: Treating RN: [birth year] (77 y.o. [name] Primary Care Vanden Fawaz: [name] Other Clinician: Referring Lovinia Snare: Treating Ciaran Begay/Extender: [name] in Treatment: 12 Encounter Discharge Information Items Post Procedure Vitals Discharge Condition: Stable Temperature (F): 97.9 Ambulatory Status: Ambulatory Pulse (bpm): 76 Discharge Destination: Home Respiratory Rate (breaths/min): 18 Transportation: Private Auto Blood Pressure (mmHg): 169/75 MEGNAN, EARNHARDT ([id number]) [id number].pdf Page 2 of 9 Accompanied By: self Schedule Follow-up Appointment: Yes Clinical Summary of Care: Patient Declined Electronic Signature(s) Signed: 05/29/2022 4:39:46 PM By: Sabas Sous By: Adline Peals on 05/29/2022 15:23:24 -------------------------------------------------------------------------------- Lower Extremity Assessment Details Patient Name: Date of Service: Misty PARRISH, RAHIMI 05/29/2022 2:30 PM Medical Record Number: [id number] Patient Account Number: [id number] Date of Birth/Sex: Treating RN: 03/31/1945 (77 y.o. [name] Primary Care Uldine Fuster: [name] Other Clinician: Referring Travers Goodley: Treating Baird Polinski/Extender: [name] in Treatment: 12 Edema Assessment Assessed: [Left: No] [Right: No] Edema: [Left: Ye]  [Right: s] Calf Left: Right: Point of Measurement: 33 cm From Medial Instep 31.3 cm Ankle Left: Right: Point of Measurement: 9 cm From Medial Instep 19.5 cm Vascular Assessment Pulses: Dorsalis Pedis Palpable: [Right:Yes] Electronic Signature(s) Signed: 05/29/2022 4:39:46 PM By: Adline Peals Entered By: Adline Peals on 05/29/2022 14:41:03 -------------------------------------------------------------------------------- Multi Wound Chart Details Patient Name: Date of Service: [name], [name] 05/29/2022 2:30 PM Medical Record Number: [id number] Patient Account Number: [id number] Date of Birth/Sex: Treating RN: 03/28/1945 (77 y.o. F) Primary Care Krissie Merrick: [name] Other Clinician: Referring Albina Gosney: Treating Dail Lerew/Extender: [name] in Treatment: 12 Vital Signs  Height(in): 60 Pulse(bpm): 76 Weight(lbs): 126 Blood Pressure(mmHg): 169/75 Body Mass Index(BMI): 24.6 Temperature(F): 97.9 Respiratory Rate(breaths/min): 18 [1:Photos:] [N/A:N/A [id number] Page 3 of 9] Right Achilles Right, Posterior Lower Leg N/A Wound Location: Gradually Appeared Shear/Friction N/A Wounding Event: Abscess Venous Leg Ulcer N/A Primary Etiology: Asthma, Hypertension, Received Asthma, Hypertension, Received N/A Comorbid History: Radiation Radiation 08/30/2021 04/08/2022 N/A Date Acquired: 12 6 N/A Weeks of Treatment: Open Open N/A Wound Status: No No N/A Wound Recurrence: 2x2.5x0.1 0.4x1.3x0.1 N/A Measurements L x W x D (cm) 3.927 0.408 N/A A (cm) : rea 0.393 0.041 N/A Volume (cm) : 41.20% 42.30% N/A % Reduction in A rea: 41.20% 42.30% N/A % Reduction in Volume: Full Thickness Without Exposed Full Thickness Without Exposed N/A Classification: Support Structures Support Structures Medium Medium N/A Exudate A mount: Serosanguineous Serosanguineous N/A Exudate Type: red, brown red, brown N/A Exudate  Color: Distinct, outline attached Distinct, outline attached N/A Wound Margin: Small (1-33%) Medium (34-66%) N/A Granulation A mount: Red Pink N/A Granulation Quality: Large (67-100%) Medium (34-66%) N/A Necrotic A mount: Fat Layer (Subcutaneous Tissue): Yes Fat Layer (Subcutaneous Tissue): Yes N/A Exposed Structures: Fascia: No Fascia: No Tendon: No Tendon: No Muscle: No Muscle: No Joint: No Joint: No Bone: No Bone: No Small (1-33%) Medium (34-66%) N/A Epithelialization: Debridement - Excisional Debridement - Excisional N/A Debridement: Pre-procedure Verification/Time Out 14:57 14:57 N/A Taken: Lidocaine 4% Topical Solution Lidocaine 4% Topical Solution N/A Pain Control: Subcutaneous, Slough Subcutaneous, Slough N/A Tissue Debrided: Skin/Subcutaneous Tissue Skin/Subcutaneous Tissue N/A Level: 5 0.52 N/A Debridement A (sq cm): rea Curette Curette N/A Instrument: Minimum Minimum N/A Bleeding: Pressure Pressure N/A Hemostasis A chieved: Procedure was tolerated well Procedure was tolerated well N/A Debridement Treatment Response: 2x2.5x0.1 0.4x1.3x0.1 N/A Post Debridement Measurements L x W x D (cm) 0.393 0.041 N/A Post Debridement Volume: (cm) Excoriation: No Excoriation: No N/A Periwound Skin Texture: Induration: No Induration: No Callus: No Callus: No Crepitus: No Crepitus: No Rash: No Rash: No Scarring: No Scarring: No Maceration: No Maceration: No N/A Periwound Skin Moisture: Dry/Scaly: No Dry/Scaly: No Rubor: Yes Atrophie Blanche: No N/A Periwound Skin Color: Atrophie Blanche: No Cyanosis: No Cyanosis: No Ecchymosis: No Ecchymosis: No Erythema: No Erythema: No Hemosiderin Staining: No Hemosiderin Staining: No Mottled: No Mottled: No Pallor: No Pallor: No Rubor: No No Abnormality No Abnormality N/A Temperature: Compression Therapy Debridement N/A Procedures Performed: Debridement Treatment Notes Wound #1 (Achilles) Wound  Laterality: Right Cleanser Soap and Water Discharge Instruction: May shower and wash wound with dial antibacterial soap and water prior to dressing change. Wound Cleanser Discharge Instruction: Cleanse the wound with wound cleanser prior to applying a clean dressing using gauze sponges, not tissue or cotton balls. Peri-Wound Care Triamcinolone 15 (g) Discharge Instruction: Use triamcinolone 15 (g) as directed Zinc Oxide Ointment 30g tube Discharge Instruction: Apply Zinc Oxide to periwound with each dressing change Sween Lotion (Moisturizing lotion) CHEY, PEASLEE V ([id number]) [id number] Page 4 of 9 Discharge Instruction: Apply moisturizing lotion as directed Topical Primary Dressing Hydrofera Blue Classic Foam, 4x4 in Discharge Instruction: Moisten with saline prior to applying to wound bed Secondary Dressing CarboFLEX Odor Control Dressing, 4x4 in Discharge Instruction: Apply over primary dressing as directed. Zetuvit Plus Silicone Border Dressing 4x4 (in/in) Discharge Instruction: Apply silicone border over primary dressing as directed. Secured With Compression Wrap Urgo K2 Lite, two layer compression system, regular Discharge Instruction: Apply Urgo K2 Lite as directed (alternative to 3 layer compression). Compression Stockings Add-Ons Wound #2 (Lower Leg) Wound Laterality: Right, Posterior Cleanser Soap and Water  Discharge Instruction: May shower and wash wound with dial antibacterial soap and water prior to dressing change. Wound Cleanser Discharge Instruction: Cleanse the wound with wound cleanser prior to applying a clean dressing using gauze sponges, not tissue or cotton balls. Peri-Wound Care Triamcinolone 15 (g) Discharge Instruction: Use triamcinolone 15 (g) as directed Zinc Oxide Ointment 30g tube Discharge Instruction: Apply Zinc Oxide to periwound with each dressing change Sween Lotion (Moisturizing lotion) Discharge Instruction: Apply  moisturizing lotion as directed Topical Primary Dressing Maxorb Extra Ag+ Alginate Dressing, 2x2 (in/in) Discharge Instruction: Apply to wound bed as instructed Secondary Dressing CarboFLEX Odor Control Dressing, 4x4 in Discharge Instruction: Apply over primary dressing as directed. Zetuvit Plus Silicone Border Dressing 4x4 (in/in) Discharge Instruction: Apply silicone border over primary dressing as directed. Secured With Compression Wrap Urgo K2 Lite, two layer compression system, regular Discharge Instruction: Apply Urgo K2 Lite as directed (alternative to 3 layer compression). Compression Stockings Add-Ons Electronic Signature(s) Signed: 05/29/2022 3:24:32 PM By: Fredirick Maudlin MD FACS Entered By: Fredirick Maudlin on 05/29/2022 15:24:31 -------------------------------------------------------------------------------- Multi-Disciplinary Care Plan Details Patient Name: Date of Service: [place], Copeland V. 05/29/2022 2:30 PM [name] ([id number].pdf Page 5 of 9 Medical Record Number: [id number] Patient Account Number: [id number] Date of Birth/Sex: Treating RN: April 02, 1945 (77 y.o. [name] Primary Care Karisma Meiser: [name] Other Clinician: Referring Glenford Garis: Treating Stephania Macfarlane/Extender: [name] in Treatment: 12 Active Inactive Wound/Skin Impairment Nursing Diagnoses: Knowledge deficit related to ulceration/compromised skin integrity Goals: Patient/caregiver will verbalize understanding of skin care regimen Date Initiated: 03/01/2022 Target Resolution Date: 06/02/2022 Goal Status: Active Interventions: Assess ulceration(s) every visit Treatment Activities: Skin care regimen initiated : 03/01/2022 Notes: Electronic Signature(s) Signed: 05/29/2022 4:39:46 PM By: Adline Peals Entered By: Adline Peals on 05/29/2022  14:31:50 -------------------------------------------------------------------------------- Pain Assessment Details Patient Name: Date of Service: [name], [name] 05/29/2022 2:30 PM Medical Record Number: [id number] Patient Account Number: [id number] Date of Birth/Sex: Treating RN: Nov 22, 1945 (77 y.o. [name] Primary Care Bentli Llorente: [name] Other Clinician: Referring Danay Mckellar: Treating Seren Chaloux/Extender: [name] in Treatment: 12 Active Problems Location of Pain Severity and Description of Pain Patient Has Paino No Site Locations Rate the pain. Current Pain Level: 0 Pain Management and Medication Current Pain Management: Electronic Signature(s) Signed: 05/29/2022 4:39:46 PM By: Adline Peals Entered By: Adline Peals on 05/29/2022 14:31:42 [name] ([id number]) [id number].pdf Page 6 of 9 -------------------------------------------------------------------------------- Patient/Caregiver Education Details Patient Name: Date of Service: [name], [name] 3/25/2024andnbsp2:30 PM Medical Record Number: [id number] Patient Account Number: [id number] Date of Birth/Gender: Treating RN: 09-12-1945 (77 y.o. [name] Primary Care Physician: [name] Other Clinician: Referring Physician: Treating Physician/Extender: [name] in Treatment: 12 Education Assessment Education Provided To: Patient Education Topics Provided Wound/Skin Impairment: Methods: Explain/Verbal Responses: Reinforcements needed, State content correctly Electronic Signature(s) Signed: 05/29/2022 4:39:46 PM By: Adline Peals Entered By: Adline Peals on 05/29/2022 14:32:00 -------------------------------------------------------------------------------- Wound Assessment Details Patient Name: Date of Service: [name], [name] 05/29/2022 2:30 PM Medical Record Number: [id number] Patient Account Number:  [id number] Date of Birth/Sex: Treating RN: 10/24/1945 (77 y.o. [name] Primary Care Jennalee Greaves: [name] Other Clinician: Referring Leily Capek: Treating Oneal Schoenberger/Extender: Otelia Sergeant Weeks in Treatment: 12 Wound Status Wound Number: 1 Primary Etiology: Abscess Wound Location: Right Achilles Wound Status: Open Wounding Event: Gradually Appeared Comorbid History: Asthma, Hypertension, Received Radiation Date Acquired: 08/30/2021 Weeks Of Treatment: 12 Clustered Wound: No Photos Wound Measurements  Length: (cm) 2 Width: (cm) 2.5 Depth: (cm) 0.1 Area: (cm) 3.927 Volume: (cm) 0.393 % Reduction in Area: 41.2% % Reduction in Volume: 41.2% Epithelialization: Small (1-33%) Tunneling: No Undermining: No Wound Description Classification: Full Thickness Without Exposed Support Structures Wound Margin: Distinct, outline attached MARYON, EARNHEART V ([id number]) Exudate Amount: Medium Exudate Type: Serosanguineous Exudate Color: red, brown Foul Odor After Cleansing: No Slough/Fibrino Yes [id number] Page 7 of 9 Wound Bed Granulation Amount: Small (1-33%) Exposed Structure Granulation Quality: Red Fascia Exposed: No Necrotic Amount: Large (67-100%) Fat Layer (Subcutaneous Tissue) Exposed: Yes Necrotic Quality: Adherent Slough Tendon Exposed: No Muscle Exposed: No Joint Exposed: No Bone Exposed: No Periwound Skin Texture Texture Color No Abnormalities Noted: Yes No Abnormalities Noted: No Atrophie Blanche: No Moisture Cyanosis: No No Abnormalities Noted: Yes Ecchymosis: No Erythema: No Hemosiderin Staining: No Mottled: No Pallor: No Rubor: Yes Temperature / Pain Temperature: No Abnormality Treatment Notes Wound #1 (Achilles) Wound Laterality: Right Cleanser Soap and Water Discharge Instruction: May shower and wash wound with dial antibacterial soap and water prior to dressing change. Wound Cleanser Discharge  Instruction: Cleanse the wound with wound cleanser prior to applying a clean dressing using gauze sponges, not tissue or cotton balls. Peri-Wound Care Triamcinolone 15 (g) Discharge Instruction: Use triamcinolone 15 (g) as directed Zinc Oxide Ointment 30g tube Discharge Instruction: Apply Zinc Oxide to periwound with each dressing change Sween Lotion (Moisturizing lotion) Discharge Instruction: Apply moisturizing lotion as directed Topical Primary Dressing Hydrofera Blue Classic Foam, 4x4 in Discharge Instruction: Moisten with saline prior to applying to wound bed Secondary Dressing CarboFLEX Odor Control Dressing, 4x4 in Discharge Instruction: Apply over primary dressing as directed. Zetuvit Plus Silicone Border Dressing 4x4 (in/in) Discharge Instruction: Apply silicone border over primary dressing as directed. Secured With Compression Wrap Urgo K2 Lite, two layer compression system, regular Discharge Instruction: Apply Urgo K2 Lite as directed (alternative to 3 layer compression). Compression Stockings Add-Ons Electronic Signature(s) Signed: 05/29/2022 4:39:46 PM By: Adline Peals Entered By: Adline Peals on 05/29/2022 14:44:55 [name] ([id number] Page 8 of 9 -------------------------------------------------------------------------------- Wound Assessment Details Patient Name: Date of Service: [name], [name] 05/29/2022 2:30 PM Medical Record Number: [id number] Patient Account Number: [id number] Date of Birth/Sex: Treating RN: 1946-02-08 (77 y.o. [name] Primary Care Artur Winningham: [name] Other Clinician: Referring Elvenia Godden: Treating Vollie Brunty/Extender: Otelia Sergeant Weeks in Treatment: 12 Wound Status Wound Number: 2 Primary Venous Leg Ulcer Etiology: Wound Location: Right, Posterior Lower Leg Wound Open Wounding Event: Shear/Friction Status: Date Acquired: 04/08/2022 Notes: Pt states she started  feeling a little sensation under her wrap Weeks Of Treatment: 6 and now has a new place Clustered Wound: No Comorbid Asthma, Hypertension, Received Radiation History: Photos Wound Measurements Length: (cm) 0.4 Width: (cm) 1.3 Depth: (cm) 0.1 Area: (cm) 0.408 Volume: (cm) 0.041 % Reduction in Area: 42.3% % Reduction in Volume: 42.3% Epithelialization: Medium (34-66%) Tunneling: No Undermining: No Wound Description Classification: Full Thickness Without Exposed Support Structures Wound Margin: Distinct, outline attached Exudate Amount: Medium Exudate Type: Serosanguineous Exudate Color: red, brown Foul Odor After Cleansing: No Slough/Fibrino Yes Wound Bed Granulation Amount: Medium (34-66%) Exposed Structure Granulation Quality: Pink Fascia Exposed: No Necrotic Amount: Medium (34-66%) Fat Layer (Subcutaneous Tissue) Exposed: Yes Necrotic Quality: Adherent Slough Tendon Exposed: No Muscle Exposed: No Joint Exposed: No Bone Exposed: No Periwound Skin Texture Texture Color No Abnormalities Noted: Yes No Abnormalities Noted: No Atrophie Blanche: No Moisture Cyanosis: No No Abnormalities Noted: Yes Ecchymosis: No Erythema: No Hemosiderin  Staining: No Mottled: No Pallor: No Rubor: No Temperature / Pain Temperature: No Abnormality Treatment Notes Wound #2 (Lower Leg) Wound Laterality: Right, Posterior [name], Misty V ([id number]) [phone].pdf Page 9 of 9 Cleanser Soap and Water Discharge Instruction: May shower and wash wound with dial antibacterial soap and water prior to dressing change. Wound Cleanser Discharge Instruction: Cleanse the wound with wound cleanser prior to applying a clean dressing using gauze sponges, not tissue or cotton balls. Peri-Wound Care Triamcinolone 15 (g) Discharge Instruction: Use triamcinolone 15 (g) as directed Zinc Oxide Ointment 30g tube Discharge Instruction: Apply Zinc Oxide to periwound with each  dressing change Sween Lotion (Moisturizing lotion) Discharge Instruction: Apply moisturizing lotion as directed Topical Primary Dressing Maxorb Extra Ag+ Alginate Dressing, 2x2 (in/in) Discharge Instruction: Apply to wound bed as instructed Secondary Dressing CarboFLEX Odor Control Dressing, 4x4 in Discharge Instruction: Apply over primary dressing as directed. Zetuvit Plus Silicone Border Dressing 4x4 (in/in) Discharge Instruction: Apply silicone border over primary dressing as directed. Secured With Compression Wrap Urgo K2 Lite, two layer compression system, regular Discharge Instruction: Apply Urgo K2 Lite as directed (alternative to 3 layer compression). Compression Stockings Add-Ons Electronic Signature(s) Signed: 05/29/2022 4:39:46 PM By: Adline Peals Entered By: Adline Peals on 05/29/2022 14:45:22 -------------------------------------------------------------------------------- Vitals Details Patient Name: Date of Service: [name], [name] 05/29/2022 2:30 PM Medical Record Number: [id number] Patient Account Number: [id number] Date of Birth/Sex: Treating RN: 1945/03/20 (77 y.o. [name] Primary Care Lemoyne Scarpati: [name] Other Clinician: Referring Cleland Simkins: Treating Brainard Highfill/Extender: [name] in Treatment: 12 Vital Signs Time Taken: 14:35 Temperature (F): 97.9 Height (in): 60 Pulse (bpm): 76 Weight (lbs): 126 Respiratory Rate (breaths/min): 18 Body Mass Index (BMI): 24.6 Blood Pressure (mmHg): 169/75 Reference Range: 80 - 120 mg / dl Electronic Signature(s) Signed: 05/29/2022 4:39:46 PM By: Adline Peals Entered By: Adline Peals on 05/29/2022 14:35:36

## 2022-05-29 NOTE — Progress Notes (Signed)
Misty, [name] ([id number]) [id number] Page 1 of 10 Visit Report for 05/29/2022 Chief Complaint Document Details Patient Name: Date of Service: [name], [name] 05/29/2022 2:30 PM Medical Record Number: [id number] Patient Account Number: [id number] Date of Birth/Sex: Treating RN: Oct 17, 1945 (77 y.o. F) Primary Care Provider: Kathlene [name] Other Clinician: Referring Provider: Treating Provider/Extender: [name] in Treatment: 12 Information Obtained from: Patient Chief Complaint Patient seen for complaints of Non-Healing Wound. Electronic Signature(s) Signed: 05/29/2022 3:24:40 PM By: Misty Maudlin MD FACS Entered By: [name] on 05/29/2022 15:24:40 -------------------------------------------------------------------------------- Debridement Details Patient Name: Date of Service: [name], [name] 05/29/2022 2:30 PM Medical Record Number: [id number] Patient Account Number: [id number] Date of Birth/Sex: Treating RN: [birth year] (77 y.o. [name] Primary Care Provider: Kathlene [name] Other Clinician: Referring Provider: Treating Provider/Extender: [name] in Treatment: 12 Debridement Performed for Assessment: Wound #1 Right Achilles Performed By: Physician Misty Maudlin, MD Debridement Type: Debridement Level of Consciousness (Pre-procedure): Awake and Alert Pre-procedure Verification/Time Out Yes - 14:57 Taken: Start Time: 14:57 Pain Control: Lidocaine 4% T opical Solution T Area Debrided (L x W): otal 2 (cm) x 2.5 (cm) = 5 (cm) Tissue and other material debrided: Non-Viable, Slough, Subcutaneous, Slough Level: Skin/Subcutaneous Tissue Debridement Description: Excisional Instrument: Curette Bleeding: Minimum Hemostasis Achieved: Pressure Response to Treatment: Procedure was tolerated well Level of Consciousness (Post- Awake and Alert procedure): Post Debridement Measurements of Total  Wound Length: (cm) 2 Width: (cm) 2.5 Depth: (cm) 0.1 Volume: (cm) 0.393 Character of Wound/Ulcer Post Debridement: Improved Post Procedure Diagnosis Same as Pre-procedure Notes scribed for Dr. Celine [name] by Misty Peals, RN Electronic Signature(s) Signed: 05/29/2022 4:08:45 PM By: Misty Maudlin MD FACS Signed: 05/29/2022 4:39:46 PM By: [name] Entered By: [name] on 05/29/2022 15:00:10 [name] ([id number]) [id number] Page 2 of 10 -------------------------------------------------------------------------------- Debridement Details Patient Name: Date of Service: [name], [name] 05/29/2022 2:30 PM Medical Record Number: [id number] Patient Account Number: [id number] Date of Birth/Sex: Treating RN: Oct 19, 1945 (77 y.o. [name] Primary Care Provider: Kathlene [name] Other Clinician: Referring Provider: Treating Provider/Extender: [name] in Treatment: 12 Debridement Performed for Assessment: Wound #2 Right,Posterior Lower Leg Performed By: Physician Misty Maudlin, MD Debridement Type: Debridement Severity of Tissue Pre Debridement: Fat layer exposed Level of Consciousness (Pre-procedure): Awake and Alert Pre-procedure Verification/Time Out Yes - 14:57 Taken: Start Time: 14:57 Pain Control: Lidocaine 4% T opical Solution T Area Debrided (L x W): otal 0.4 (cm) x 1.3 (cm) = 0.52 (cm) Tissue and other material debrided: Non-Viable, Slough, Subcutaneous, Slough Level: Skin/Subcutaneous Tissue Debridement Description: Excisional Instrument: Curette Bleeding: Minimum Hemostasis Achieved: Pressure Response to Treatment: Procedure was tolerated well Level of Consciousness (Post- Awake and Alert procedure): Post Debridement Measurements of Total Wound Length: (cm) 0.4 Width: (cm) 1.3 Depth: (cm) 0.1 Volume: (cm) 0.041 Character of Wound/Ulcer Post Debridement: Improved Severity of Tissue  Post Debridement: Fat layer exposed Post Procedure Diagnosis Same as Pre-procedure Notes scribed for Dr. Celine [name] by Misty Peals, RN Electronic Signature(s) Signed: 05/29/2022 4:08:45 PM By: Misty Maudlin MD FACS Signed: 05/29/2022 4:39:46 PM By: [name] Entered By: [name] on 05/29/2022 15:00:36 -------------------------------------------------------------------------------- HPI Details Patient Name: Date of Service: [name], [name] 05/29/2022 2:30 PM Medical Record Number: [id number] Patient Account Number: [id number] Date of Birth/Sex: Treating RN: Oct 08, 1945 (77 y.o. F) Primary Care Provider: Kathlene [name] Other Clinician: Referring Provider: Treating Provider/Extender: [name] in Treatment: 12 History  of Present Illness HPI Description: ADMISSION 03/01/2022 This is a 77 year old otherwise fairly healthy woman (not diabetic, not obese, does not smoke) who presents to the clinic with an ulcer on her right posterior leg. She has a history of a liposarcoma excised in 1973. The patient is not sure but believes she received radiation therapy to the location. She did have a skin graft. About 6 months ago, she developed a small ulcer in the area that seemed to start as an abscess. She has had an open wound in that area since then. A shave biopsy was taken by a dermatologist that showed inflammation without evidence of malignancy. She has been applying Vaseline to the site along with periwound TCA. ABI in clinic today is 0.94. Due to the failure of the wound to heal properly, she has been referred to the wound care center for further evaluation and management. 03/09/2022: The tissue on the wound is protruding further from the skin surface. It does not have a typical appearance consistent with hypertrophic granulation tissue. I am concerned that it may represent malignancy. 03/17/2022: Fortunately, the biopsy that I took last week was negative for  malignancy. It returned as consistent with an ulcer and inflamed granulation tissue. Misty, [name] ([id number]) [id number] Page 3 of 10 She still has hypertrophic granulation tissue at the site and some slough accumulation on the surface. No significant change in the wound dimensions. 03/23/2022: The wound measured smaller today and the hypertrophic granulation tissue is less prominent. Due to the unusual shape of her leg from old surgery, however, there has been some friction from her compressive wrap. 03/31/2022: The wound is slightly smaller today. There is light slough on the surface. There is still some hypertrophic granulation tissue present. 04/12/2022: She has a new wound near the top of her surgical defect. Where the skin rolls over, there has been some friction and abrasion that has resulted in tissue breakdown. There is slough buildup on the surface. The original wound is smaller and there is epithelialization around the borders. There is slough buildup on the surface again. She has been approved for TheraSkin but it is unclear what her financial responsibility would be at this point. We are still working on determining that. 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 04/17/2022: Both posterior leg wounds are smaller today. They both have slough accumulation. She had her Mohs surgery and there is a defect on her anterior tibial surface. I spoke with her dermatologist and we are going to assume care for this wound. There is a layer of rubbery slough on this wound and it appears a little dry. 04/24/2022: All 3 wounds have a thick layer of rubbery slough on them. There is hypertrophic granulation tissue underneath this on both the anterior tibial surface wound and the posterior Achilles wound. She has a wound on her right forearm that was cultured by her dermatologist and grew out heavy Serratia marcescens  and moderate Staph aureus. Apparently they are just having her apply topical mupirocin to the site because she has previously had a reaction to moxifloxacin, which is apparently the antibiotic they wanted to use. She asks if I have any other recommendations. 05/01/2022: All 3 wounds look better this week. The hypertrophic granulation tissue has resolved on her anterior tibia and posterior Achilles wound. The wounds are all smaller. They have some slough accumulation. The moisture balance on the anterior tibial wound is good; the posterior wounds are  a bit dry. 05/08/2022: The anterior tibial wound is smaller without any hypertrophic granulation tissue. There is a little bit of slough buildup. There is more slough on the posterior Achilles wound. The posterior wound under the flap of skin is a little bit macerated. 05/15/2022: The anterior tibial wound is healed. The posterior Achilles wound has heavy slough buildup. The posterior wound under the flap of skin continues to be macerated with slough accumulation. 05/29/2022: We are still dealing with a lot of moisture accumulation under the skin flap. There was an odor coming from the wound today when her dressing was removed. The Achilles wound has slough accumulation once again. Electronic Signature(s) Signed: 05/29/2022 3:25:46 PM By: Misty Maudlin MD FACS Entered By: [name] on 05/29/2022 15:25:46 -------------------------------------------------------------------------------- Physical Exam Details Patient Name: Date of Service: [name], [name] 05/29/2022 2:30 PM Medical Record Number: [id number] Patient Account Number: [id number] Date of Birth/Sex: Treating RN: 02/23/1946 (77 y.o. F) Primary Care Provider: Kathlene [name] Other Clinician: Referring Provider: Treating Provider/Extender: [name] in Treatment: 12 Constitutional Hypertensive, asymptomatic. . . . no acute distress. Respiratory Normal work of breathing on  room air. Notes 05/29/2022: We are still dealing with a lot of moisture accumulation under the skin flap. There was an odor coming from the wound today when her dressing was removed. The Achilles wound has slough accumulation once again. Electronic Signature(s) Signed: 05/29/2022 3:26:16 PM By: Misty Maudlin MD FACS Entered By: [name] on 05/29/2022 15:26:16 -------------------------------------------------------------------------------- Physician Orders Details Patient Name: Date of Service: [name], [name] 05/29/2022 2:30 PM Medical Record Number: [id number] Patient Account Number: [id number] Date of Birth/Sex: Treating RN: Jan 22, 1946 (77 y.o. [name] Primary Care Provider: Kathlene [name] Other Clinician: Referring Provider: Treating Provider/Extender: [name] in Treatment: 12 Verbal / Phone Orders: No Diagnosis [address] GATHEL, GUINAN ([id number]) [id number] Page 4 of 10 ICD-10 Coding Code Description [phone] Non-pressure chronic ulcer of other part of right lower leg with fat layer exposed L97.818 Non-pressure chronic ulcer of other part of right lower leg with other specified severity I10 Essential (primary) hypertension Z85.831 Personal history of malignant neoplasm of soft tissue Z92.3 Personal history of irradiation Follow-up Appointments ppointment in 1 week. - Dr. Celine [name] Return A Anesthetic (In clinic) Topical Lidocaine 4% applied to wound bed Bathing/ Shower/ Hygiene May shower with protection but do not get wound dressing(s) wet. Protect dressing(s) with water repellant cover (for example, large plastic bag) or a cast cover and may then take shower. - Please do not get leg wraps wet on Right Leg. May use a cast protector on right leg- can purchase from [employer]; Medical supply store; [facility] etc. the cast protector costs approximately $18-$29 Edema Control - Lymphedema / SCD / Other void standing for long  periods of time. - Rest and elevate legs throughout the day. A Moisturize legs daily. - Use a lotion or moisturizer for example :Sween,Eucerin;Aquaphor etc. Wound Treatment Wound #1 - Achilles Wound Laterality: Right Cleanser: Soap and Water 1 x Per Week/30 Days Discharge Instructions: May shower and wash wound with dial antibacterial soap and water prior to dressing change. Cleanser: Wound Cleanser 1 x Per Week/30 Days Discharge Instructions: Cleanse the wound with wound cleanser prior to applying a clean dressing using gauze sponges, not tissue or cotton balls. Peri-Wound Care: Triamcinolone 15 (g) 1 x Per Week/30 Days Discharge Instructions: Use triamcinolone 15 (g) as directed Peri-Wound Care: Zinc Oxide Ointment 30g tube 1 x Per  Week/30 Days Discharge Instructions: Apply Zinc Oxide to periwound with each dressing change Peri-Wound Care: Sween Lotion (Moisturizing lotion) 1 x Per Week/30 Days Discharge Instructions: Apply moisturizing lotion as directed Prim Dressing: Hydrofera Blue Classic Foam, 4x4 in 1 x Per Week/30 Days ary Discharge Instructions: Moisten with wound cleanser prior to applying to wound bed Secondary Dressing: CarboFLEX Odor Control Dressing, 4x4 in 1 x Per Week/30 Days Discharge Instructions: Apply over primary dressing as directed. Secondary Dressing: Zetuvit Plus Silicone Border Dressing 4x4 (in/in) 1 x Per Week/30 Days Discharge Instructions: Apply silicone border over primary dressing as directed. Compression Wrap: Urgo K2 Lite, two layer compression system, regular 1 x Per Week/30 Days Discharge Instructions: Apply Urgo K2 Lite as directed (alternative to 3 layer compression). Wound #2 - Lower Leg Wound Laterality: Right, Posterior Cleanser: Soap and Water 1 x Per Week/30 Days Discharge Instructions: May shower and wash wound with dial antibacterial soap and water prior to dressing change. Cleanser: Wound Cleanser 1 x Per Week/30 Days Discharge Instructions:  Cleanse the wound with wound cleanser prior to applying a clean dressing using gauze sponges, not tissue or cotton balls. Peri-Wound Care: Triamcinolone 15 (g) 1 x Per Week/30 Days Discharge Instructions: Use triamcinolone 15 (g) as directed Peri-Wound Care: Zinc Oxide Ointment 30g tube 1 x Per Week/30 Days Discharge Instructions: Apply Zinc Oxide to periwound with each dressing change Peri-Wound Care: Sween Lotion (Moisturizing lotion) 1 x Per Week/30 Days Discharge Instructions: Apply moisturizing lotion as directed Prim Dressing: Maxorb Extra Ag+ Alginate Dressing, 2x2 (in/in) 1 x Per Week/30 Days ary Discharge Instructions: Apply to wound bed as instructed Secondary Dressing: CarboFLEX Odor Control Dressing, 4x4 in 1 x Per Week/30 Days Discharge Instructions: Apply over primary dressing as directed. Secondary Dressing: Zetuvit Plus Silicone Border Dressing 4x4 (in/in) 1 x Per Week/30 Days Discharge Instructions: Apply silicone border over primary dressing as directed. BIAK, VOELLER ([id number]) [id number] Page 5 of 10 Compression Wrap: Urgo K2 Lite, two layer compression system, regular 1 x Per Week/30 Days Discharge Instructions: Apply Urgo K2 Lite as directed (alternative to 3 layer compression). Patient Medications llergies: bee venom protein (honey bee), moxifloxacin, promethazine HCl, balsam [place], thimerosal A Notifications Medication Indication Start End 05/29/2022 lidocaine DOSE topical 4 % cream - cream topical Electronic Signature(s) Signed: 05/29/2022 4:08:45 PM By: Misty Maudlin MD FACS Entered By: [name] on 05/29/2022 15:26:37 -------------------------------------------------------------------------------- Problem List Details Patient Name: Date of Service: [name], [name] 05/29/2022 2:30 PM Medical Record Number: [id number] Patient Account Number: [id number] Date of Birth/Sex: Treating RN: 1945-10-11 (77 y.o. F) Primary Care  Provider: Kathlene [name] Other Clinician: Referring Provider: Treating Provider/Extender: [name] in Treatment: 12 Active Problems ICD-10 Encounter Code Description Active Date MDM Diagnosis L97.812 Non-pressure chronic ulcer of other part of right lower leg with fat layer 03/01/2022 No Yes exposed L97.818 Non-pressure chronic ulcer of other part of right lower leg with other specified 04/17/2022 No Yes severity I10 Essential (primary) hypertension 03/01/2022 No Yes Z85.831 Personal history of malignant neoplasm of soft tissue 03/01/2022 No Yes Z92.3 Personal history of irradiation 03/01/2022 No Yes Inactive Problems Resolved Problems Electronic Signature(s) Signed: 05/29/2022 3:23:57 PM By: Misty Maudlin MD FACS Entered By: [name] on 05/29/2022 15:23:56 -------------------------------------------------------------------------------- Progress Note Details Patient Name: Date of Service: [name], [name] 05/29/2022 2:30 PM Medical Record Number: [id number] Patient Account Number: [id number] ARDALIA, NEUMEISTER ([id number]) [id number] Page 6 of 10 Date of Birth/Sex: Treating RN: 12-02-1945 (77 y.o. F)  Primary Care Provider: Other Clinician: Kathlene [name] Referring Provider: Treating Provider/Extender: [name] in Treatment: 12 Subjective Chief Complaint Information obtained from Patient Patient seen for complaints of Non-Healing Wound. History of Present Illness (HPI) ADMISSION 03/01/2022 This is a 77 year old otherwise fairly healthy woman (not diabetic, not obese, does not smoke) who presents to the clinic with an ulcer on her right posterior leg. She has a history of a liposarcoma excised in 1973. The patient is not sure but believes she received radiation therapy to the location. She did have a skin graft. About 6 months ago, she developed a small ulcer in the area that seemed to start as an abscess. She  has had an open wound in that area since then. A shave biopsy was taken by a dermatologist that showed inflammation without evidence of malignancy. She has been applying Vaseline to the site along with periwound TCA. ABI in clinic today is 0.94. Due to the failure of the wound to heal properly, she has been referred to the wound care center for further evaluation and management. 03/09/2022: The tissue on the wound is protruding further from the skin surface. It does not have a typical appearance consistent with hypertrophic granulation tissue. I am concerned that it may represent malignancy. 03/17/2022: Fortunately, the biopsy that I took last week was negative for malignancy. It returned as consistent with an ulcer and inflamed granulation tissue. She still has hypertrophic granulation tissue at the site and some slough accumulation on the surface. No significant change in the wound dimensions. 03/23/2022: The wound measured smaller today and the hypertrophic granulation tissue is less prominent. Due to the unusual shape of her leg from old surgery, however, there has been some friction from her compressive wrap. 03/31/2022: The wound is slightly smaller today. There is light slough on the surface. There is still some hypertrophic granulation tissue present. 04/12/2022: She has a new wound near the top of her surgical defect. Where the skin rolls over, there has been some friction and abrasion that has resulted in tissue breakdown. There is slough buildup on the surface. The original wound is smaller and there is epithelialization around the borders. There is slough buildup on the surface again. She has been approved for TheraSkin but it is unclear what her financial responsibility would be at this point. We are still working on determining that. 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 04/17/2022: Both posterior leg wounds are smaller  today. They both have slough accumulation. She had her Mohs surgery and there is a defect on her anterior tibial surface. I spoke with her dermatologist and we are going to assume care for this wound. There is a layer of rubbery slough on this wound and it appears a little dry. 04/24/2022: All 3 wounds have a thick layer of rubbery slough on them. There is hypertrophic granulation tissue underneath this on both the anterior tibial surface wound and the posterior Achilles wound. She has a wound on her right forearm that was cultured by her dermatologist and grew out heavy Serratia marcescens and moderate Staph aureus. Apparently they are just having her apply topical mupirocin to the site because she has previously had a reaction to moxifloxacin, which is apparently the antibiotic they wanted to use. She asks if I have any other recommendations. 05/01/2022: All 3 wounds look better this week. The hypertrophic granulation tissue has resolved on her anterior tibia and posterior Achilles wound.  The wounds are all smaller. They have some slough accumulation. The moisture balance on the anterior tibial wound is good; the posterior wounds are a bit dry. 05/08/2022: The anterior tibial wound is smaller without any hypertrophic granulation tissue. There is a little bit of slough buildup. There is more slough on the posterior Achilles wound. The posterior wound under the flap of skin is a little bit macerated. 05/15/2022: The anterior tibial wound is healed. The posterior Achilles wound has heavy slough buildup. The posterior wound under the flap of skin continues to be macerated with slough accumulation. 05/29/2022: We are still dealing with a lot of moisture accumulation under the skin flap. There was an odor coming from the wound today when her dressing was removed. The Achilles wound has slough accumulation once again. Patient History Information obtained from Patient. Family History Unknown History. Social  History Never smoker, Marital Status - Married, Alcohol Use - Never, Drug Use - No History, Caffeine Use - Never. Medical History Respiratory Patient has history of Asthma - Childhood asthma Cardiovascular Patient has history of Hypertension Oncologic Patient has history of Received Radiation - R/T Liposarcoma in 1972 Hospitalization/Surgery History - Left Breast Lumpectomy;Tonsillectomy ; Squamous cell carcinoma; Hysterectomy;Right Leg liposarcoma. Medical A Surgical History Notes nd Eyes Pt. states she has pre-glaucoma Cardiovascular Hx: Angio-edema r/t Bee venom; Hyperlipidemia Endocrine Hx: hypothyroidism Immunological Hx: Currently being treated with Immunotherapy (r/t Bee-Venom) Integumentary (Skin) Hx: Urticaria SELAH, MAURI V ([id number]) [id number] Page 7 of 10 Musculoskeletal Hx: Osteoporosis Oncologic 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)-Biopsy taken to rule out squamous cell carcinoma on right arm and right leg (02/24/22), still pending results Objective Constitutional Hypertensive, asymptomatic. no acute distress. Vitals Time Taken: 2:35 PM, Height: 60 in, Weight: 126 lbs, BMI: 24.6, Temperature: 97.9 F, Pulse: 76 bpm, Respiratory Rate: 18 breaths/min, Blood Pressure: 169/75 mmHg. Respiratory Normal work of breathing on room air. General Notes: 05/29/2022: We are still dealing with a lot of moisture accumulation under the skin flap. There was an odor coming from the wound today when her dressing was removed. The Achilles wound has slough accumulation once again. Integumentary (Hair, Skin) Wound #1 status is Open. Original cause of wound was Gradually Appeared. The date acquired was: 08/30/2021. The wound has been in treatment 12 weeks. The wound is located on the Right Achilles. The wound measures 2cm length x 2.5cm width x 0.1cm depth; 3.927cm^2 area and 0.393cm^3 volume. There is Fat Layer (Subcutaneous  Tissue) exposed. There is no tunneling or undermining noted. There is a medium amount of serosanguineous drainage noted. The wound margin is distinct with the outline attached to the wound base. There is small (1-33%) red granulation within the wound bed. There is a large (67-100%) amount of necrotic tissue within the wound bed including Adherent Slough. The periwound skin appearance had no abnormalities noted for texture. The periwound skin appearance had no abnormalities noted for moisture. The periwound skin appearance exhibited: Rubor. The periwound skin appearance did not exhibit: Atrophie Blanche, Cyanosis, Ecchymosis, Hemosiderin Staining, Mottled, Pallor, Erythema. Periwound temperature was noted as No Abnormality. Wound #2 status is Open. Original cause of wound was Shear/Friction. The date acquired was: 04/08/2022. The wound has been in treatment 6 weeks. The wound is located on the Right,Posterior Lower Leg. The wound measures 0.4cm length x 1.3cm width x 0.1cm depth; 0.408cm^2 area and 0.041cm^3 volume. There is Fat Layer (Subcutaneous Tissue) exposed. There is no tunneling or undermining noted. There is a medium amount of serosanguineous drainage  noted. The wound margin is distinct with the outline attached to the wound base. There is medium (34-66%) pink granulation within the wound bed. There is a medium (34- 66%) amount of necrotic tissue within the wound bed including Adherent Slough. The periwound skin appearance had no abnormalities noted for texture. The periwound skin appearance had no abnormalities noted for moisture. The periwound skin appearance did not exhibit: Atrophie Blanche, Cyanosis, Ecchymosis, Hemosiderin Staining, Mottled, Pallor, Rubor, Erythema. Periwound temperature was noted as No Abnormality. Assessment Active Problems ICD-10 Non-pressure chronic ulcer of other part of right lower leg with fat layer exposed Non-pressure chronic ulcer of other part of right lower  leg with other specified severity Essential (primary) hypertension Personal history of malignant neoplasm of soft tissue Personal history of irradiation Procedures Wound #1 Pre-procedure diagnosis of Wound #1 is an Abscess located on the Right Achilles . There was a Excisional Skin/Subcutaneous Tissue Debridement with a total area of 5 sq cm performed by Misty Maudlin, MD. With the following instrument(s): Curette to remove Non-Viable tissue/material. Material removed includes Subcutaneous Tissue and Slough and after achieving pain control using Lidocaine 4% T opical Solution. No specimens were taken. A time out was conducted at 14:57, prior to the start of the procedure. A Minimum amount of bleeding was controlled with Pressure. The procedure was tolerated well. Post Debridement Measurements: 2cm length x 2.5cm width x 0.1cm depth; 0.393cm^3 volume. Character of Wound/Ulcer Post Debridement is improved. Post procedure Diagnosis Wound #1: Same as Pre-Procedure General Notes: scribed for Dr. Celine [name] by Misty Peals, RN. Pre-procedure diagnosis of Wound #1 is an Abscess located on the Right Achilles . There was a Double Layer Compression Therapy Procedure by Misty Peals, RN. Post procedure Diagnosis Wound #1: Same as Pre-Procedure Wound #2 Pre-procedure diagnosis of Wound #2 is a Venous Leg Ulcer located on the Right,Posterior Lower Leg .Severity of Tissue Pre Debridement is: Fat layer exposed. There was a Excisional Skin/Subcutaneous Tissue Debridement with a total area of 0.52 sq cm performed by Misty Maudlin, MD. With the IZEL, ZICKEFOOSE ([id number]) [id number] Page 8 of 10 following instrument(s): Curette to remove Non-Viable tissue/material. Material removed includes Subcutaneous Tissue and Slough and after achieving pain control using Lidocaine 4% T opical Solution. No specimens were taken. A time out was conducted at 14:57, prior to the start of  the procedure. A Minimum amount of bleeding was controlled with Pressure. The procedure was tolerated well. Post Debridement Measurements: 0.4cm length x 1.3cm width x 0.1cm depth; 0.041cm^3 volume. Character of Wound/Ulcer Post Debridement is improved. Severity of Tissue Post Debridement is: Fat layer exposed. Post procedure Diagnosis Wound #2: Same as Pre-Procedure General Notes: scribed for Dr. Celine [name] by Misty Peals, RN. Plan Follow-up Appointments: Return Appointment in 1 week. - Dr. Celine [name] Anesthetic: (In clinic) Topical Lidocaine 4% applied to wound bed Bathing/ Shower/ Hygiene: May shower with protection but do not get wound dressing(s) wet. Protect dressing(s) with water repellant cover (for example, large plastic bag) or a cast cover and may then take shower. - Please do not get leg wraps wet on Right Leg. May use a cast protector on right leg- can purchase from [employer]; Medical supply store; [facility] etc. the cast protector costs approximately $18-$29 Edema Control - Lymphedema / SCD / Other: Avoid standing for long periods of time. - Rest and elevate legs throughout the day. Moisturize legs daily. - Use a lotion or moisturizer for example :Sween,Eucerin;Aquaphor etc. The following medication(s) was prescribed: lidocaine topical 4 % cream cream  topical was prescribed at facility WOUND #1: - Achilles Wound Laterality: Right Cleanser: Soap and Water 1 x Per Week/30 Days Discharge Instructions: May shower and wash wound with dial antibacterial soap and water prior to dressing change. Cleanser: Wound Cleanser 1 x Per Week/30 Days Discharge Instructions: Cleanse the wound with wound cleanser prior to applying a clean dressing using gauze sponges, not tissue or cotton balls. Peri-Wound Care: Triamcinolone 15 (g) 1 x Per Week/30 Days Discharge Instructions: Use triamcinolone 15 (g) as directed Peri-Wound Care: Zinc Oxide Ointment 30g tube 1 x Per Week/30 Days Discharge Instructions:  Apply Zinc Oxide to periwound with each dressing change Peri-Wound Care: Sween Lotion (Moisturizing lotion) 1 x Per Week/30 Days Discharge Instructions: Apply moisturizing lotion as directed Prim Dressing: Hydrofera Blue Classic Foam, 4x4 in 1 x Per Week/30 Days ary Discharge Instructions: Moisten with wound cleanser prior to applying to wound bed Secondary Dressing: CarboFLEX Odor Control Dressing, 4x4 in 1 x Per Week/30 Days Discharge Instructions: Apply over primary dressing as directed. Secondary Dressing: Zetuvit Plus Silicone Border Dressing 4x4 (in/in) 1 x Per Week/30 Days Discharge Instructions: Apply silicone border over primary dressing as directed. Com pression Wrap: Urgo K2 Lite, two layer compression system, regular 1 x Per Week/30 Days Discharge Instructions: Apply Urgo K2 Lite as directed (alternative to 3 layer compression). WOUND #2: - Lower Leg Wound Laterality: Right, Posterior Cleanser: Soap and Water 1 x Per Week/30 Days Discharge Instructions: May shower and wash wound with dial antibacterial soap and water prior to dressing change. Cleanser: Wound Cleanser 1 x Per Week/30 Days Discharge Instructions: Cleanse the wound with wound cleanser prior to applying a clean dressing using gauze sponges, not tissue or cotton balls. Peri-Wound Care: Triamcinolone 15 (g) 1 x Per Week/30 Days Discharge Instructions: Use triamcinolone 15 (g) as directed Peri-Wound Care: Zinc Oxide Ointment 30g tube 1 x Per Week/30 Days Discharge Instructions: Apply Zinc Oxide to periwound with each dressing change Peri-Wound Care: Sween Lotion (Moisturizing lotion) 1 x Per Week/30 Days Discharge Instructions: Apply moisturizing lotion as directed Prim Dressing: Maxorb Extra Ag+ Alginate Dressing, 2x2 (in/in) 1 x Per Week/30 Days ary Discharge Instructions: Apply to wound bed as instructed Secondary Dressing: CarboFLEX Odor Control Dressing, 4x4 in 1 x Per Week/30 Days Discharge Instructions: Apply  over primary dressing as directed. Secondary Dressing: Zetuvit Plus Silicone Border Dressing 4x4 (in/in) 1 x Per Week/30 Days Discharge Instructions: Apply silicone border over primary dressing as directed. Com pression Wrap: Urgo K2 Lite, two layer compression system, regular 1 x Per Week/30 Days Discharge Instructions: Apply Urgo K2 Lite as directed (alternative to 3 layer compression). 05/29/2022: We are still dealing with a lot of moisture accumulation under the skin flap. There was an odor coming from the wound today when her dressing was removed. The Achilles wound has slough accumulation once again. I aggressively debrided both wounds with a curette today, removing slough and subcutaneous tissue, in an effort to stimulate the healing cascade. We will continue to use Hydrofera Blue classic moistened with wound cleanser on the Achilles wound, but I am going to change to silver alginate for the wound under the skin flap. I had hoped to use Sorbact with the absorbent pad, with but we did not have any available in clinic. Continue 3 layer compression. Follow-up in 1 week. Electronic Signature(s) Signed: 05/29/2022 3:27:36 PM By: Misty Maudlin MD FACS Entered By: [name] on 05/29/2022 15:27:36 [name] ([id number]) [id number] Page 9 of 10 -------------------------------------------------------------------------------- HxROS Details Patient  Name: Date of Service: [name], [name] 05/29/2022 2:30 PM Medical Record Number: [id number] Patient Account Number: [id number] Date of Birth/Sex: Treating RN: Mar 05, 1946 (77 y.o. 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 Respiratory Medical History: Positive for: Asthma - Childhood  asthma Cardiovascular Medical History: Positive for: Hypertension Past Medical History Notes: Hx: Angio-edema r/t Bee venom; Hyperlipidemia Endocrine Medical History: Past Medical History Notes: Hx: hypothyroidism Immunological Medical History: Past Medical History Notes: Hx: Currently being treated with Immunotherapy (r/t Bee-Venom) Integumentary (Skin) Medical History: Past Medical History Notes: Hx: Urticaria Musculoskeletal Medical History: Past Medical History Notes: Hx: Osteoporosis Oncologic Medical History: Positive for: Received Radiation - R/T Liposarcoma in 1972 Past Medical History Notes: 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)-Biopsy taken to rule out squamous cell carcinoma on right arm and right leg (02/24/22), still pending results Immunizations Pneumococcal Vaccine: Received Pneumococcal Vaccination: Yes Received Pneumococcal Vaccination On or After 60th Birthday: Yes Implantable Devices No devices added Hospitalization / Surgery History Type of Hospitalization/Surgery Left Breast Lumpectomy;Tonsillectomy ; Squamous cell carcinoma; Hysterectomy;Right Leg liposarcoma Family and Social History Unknown History: Yes; Never smoker; Marital Status - Married; Alcohol Use: Never; Drug Use: No History; Caffeine Use: Never; Financial Concerns: No; Food, Clothing or Shelter Needs: No; Support System Lacking: No; Transportation Concerns: No AUDI, HIGUCHI ([id number]) [id number] Page 10 of 10 Electronic Signature(s) Signed: 05/29/2022 4:08:45 PM By: Misty Maudlin MD FACS Entered By: [name] on 05/29/2022 15:25:52 -------------------------------------------------------------------------------- SuperBill Details Patient Name: Date of Service: [name], SHAAKIRA MOHRMAN 05/29/2022 Medical Record Number: [id number] Patient Account Number: [id number] Date of Birth/Sex: Treating RN: April 02, 1945 (76 y.o. F) Primary  Care Provider: Kathlene [name] Other Clinician: Referring Provider: Treating Provider/Extender: Otelia Sergeant Weeks in Treatment: 12 Diagnosis Coding ICD-10 Codes Code Description [phone] Non-pressure chronic ulcer of other part of right lower leg with fat layer exposed L97.818 Non-pressure chronic ulcer of other part of right lower leg with other specified severity I10 Essential (primary) hypertension Z85.831 Personal history of malignant neoplasm of soft tissue Z92.3 Personal history of irradiation Facility Procedures : CPT4 Code: [id number] Description: [facility] - DEB SUBQ TISSUE 20 SQ CM/< ICD-10 Diagnosis Description L97.812 Non-pressure chronic ulcer of other part of right lower leg with fat layer expos L97.818 Non-pressure chronic ulcer of other part of right lower leg with other specified Modifier: ed severity Quantity: 1 Physician Procedures : CPT4 Code Description Modifier [id number] - WC PHYS LEVEL 3 - EST PT 25 ICD-10 Diagnosis Description L97.812 Non-pressure chronic ulcer of other part of right lower leg with fat layer exposed L97.818 Non-pressure chronic ulcer of other part of right  lower leg with other specified severity Z85.831 Personal history of malignant neoplasm of soft tissue Z92.3 Personal history of irradiation Quantity: 1 : [id number] - WC PHYS SUBQ TISS 20 SQ CM ICD-10 Diagnosis Description L97.812 Non-pressure chronic ulcer of other part of right lower leg with fat layer exposed L97.818 Non-pressure chronic ulcer of other part of right lower leg with other specified  severity Quantity: 1 Electronic Signature(s) Signed: 05/29/2022 3:28:06 PM By: Misty Maudlin MD FACS Entered By: [name] on 05/29/2022 15:28:05

## 2022-06-01 ENCOUNTER — Ambulatory Visit (INDEPENDENT_AMBULATORY_CARE_PROVIDER_SITE_OTHER): Payer: Medicare HMO

## 2022-06-01 DIAGNOSIS — T63441D Toxic effect of venom of bees, accidental (unintentional), subsequent encounter: Secondary | ICD-10-CM

## 2022-06-05 ENCOUNTER — Encounter (HOSPITAL_BASED_OUTPATIENT_CLINIC_OR_DEPARTMENT_OTHER): Payer: Medicare HMO | Attending: General Surgery | Admitting: General Surgery

## 2022-06-05 DIAGNOSIS — I10 Essential (primary) hypertension: Secondary | ICD-10-CM | POA: Diagnosis not present

## 2022-06-05 DIAGNOSIS — E039 Hypothyroidism, unspecified: Secondary | ICD-10-CM | POA: Insufficient documentation

## 2022-06-05 DIAGNOSIS — L97812 Non-pressure chronic ulcer of other part of right lower leg with fat layer exposed: Secondary | ICD-10-CM | POA: Insufficient documentation

## 2022-06-05 DIAGNOSIS — L97818 Non-pressure chronic ulcer of other part of right lower leg with other specified severity: Secondary | ICD-10-CM | POA: Insufficient documentation

## 2022-06-05 DIAGNOSIS — J45909 Unspecified asthma, uncomplicated: Secondary | ICD-10-CM | POA: Diagnosis not present

## 2022-06-05 DIAGNOSIS — E785 Hyperlipidemia, unspecified: Secondary | ICD-10-CM | POA: Diagnosis not present

## 2022-06-05 DIAGNOSIS — I872 Venous insufficiency (chronic) (peripheral): Secondary | ICD-10-CM | POA: Diagnosis not present

## 2022-06-05 DIAGNOSIS — L02415 Cutaneous abscess of right lower limb: Secondary | ICD-10-CM | POA: Diagnosis not present

## 2022-06-05 DIAGNOSIS — L97212 Non-pressure chronic ulcer of right calf with fat layer exposed: Secondary | ICD-10-CM | POA: Diagnosis not present

## 2022-06-06 NOTE — Progress Notes (Signed)
SKILAR, WAAGE ([id number]) [id number] Page 1 of 11 Visit Report for 06/05/2022 Chief Complaint Document Details Patient Name: Date of Service: [name], [name] 06/05/2022 1:30 PM Medical Record Number: [id number] Patient Account Number: [id number] Date of Birth/Sex: Treating RN: 1945/08/05 (77 y.o. F) Primary Care Provider: Kathlene [name] Other Clinician: Referring Provider: Treating Provider/Extender: [name] in Treatment: 13 Information Obtained from: Patient Chief Complaint Patient seen for complaints of Non-Healing Wound. Electronic Signature(s) Signed: 06/05/2022 3:50:44 PM By: Misty Maudlin MD FACS Entered By: [name] on 06/05/2022 15:50:44 -------------------------------------------------------------------------------- Debridement Details Patient Name: Date of Service: [name], [name] 06/05/2022 1:30 PM Medical Record Number: [id number] Patient Account Number: [id number] Date of Birth/Sex: Treating RN: Apr 18, 1945 (77 y.o. [name] Primary Care Provider: Kathlene [name] Other Clinician: Referring Provider: Treating Provider/Extender: [name] in Treatment: 13 Debridement Performed for Assessment: Wound #1 Right Achilles Performed By: Physician Misty Maudlin, MD Debridement Type: Debridement Level of Consciousness (Pre-procedure): Awake and Alert Pre-procedure Verification/Time Out Yes - 14:39 Taken: Start Time: 14:39 Pain Control: Lidocaine 4% T opical Solution T Area Debrided (L x W): otal 2 (cm) x 2.3 (cm) = 4.6 (cm) Tissue and other material debrided: Non-Viable, Slough, Slough Level: Non-Viable Tissue Debridement Description: Selective/Open Wound Instrument: Curette Bleeding: Minimum Hemostasis Achieved: Pressure End Time: 14:40 Procedural Pain: 0 Post Procedural Pain: 0 Response to Treatment: Procedure was tolerated well Level of Consciousness (Post- Awake and  Alert procedure): Post Debridement Measurements of Total Wound Length: (cm) 2 Width: (cm) 2.3 Depth: (cm) 0.1 Volume: (cm) 0.361 Character of Wound/Ulcer Post Debridement: Improved Post Procedure Diagnosis Same as Pre-procedure Notes Scribed for Dr. Celine [name] by J.Scotton Electronic Signature(s) Signed: 06/05/2022 4:59:27 PM By: Misty Catholic RN Signed: 06/05/2022 5:04:49 PM By: Misty Maudlin MD FACS [place], [name] ([id number].pdf Page 2 of 11 Entered By: [name] on 06/05/2022 14:42:32 -------------------------------------------------------------------------------- Debridement Details Patient Name: Date of Service: [name], [name] 06/05/2022 1:30 PM Medical Record Number: [id number] Patient Account Number: [id number] Date of Birth/Sex: Treating RN: 24-Feb-1946 (77 y.o. [name] Primary Care Provider: Kathlene [name] Other Clinician: Referring Provider: Treating Provider/Extender: [name] in Treatment: 13 Debridement Performed for Assessment: Wound #2 Right,Posterior Lower Leg Performed By: Physician Misty Maudlin, MD Debridement Type: Debridement Severity of Tissue Pre Debridement: Fat layer exposed Level of Consciousness (Pre-procedure): Awake and Alert Pre-procedure Verification/Time Out Yes - 14:39 Taken: Start Time: 14:39 Pain Control: Lidocaine 4% T opical Solution T Area Debrided (L x W): otal 0.4 (cm) x 1 (cm) = 0.4 (cm) Tissue and other material debrided: Non-Viable, Slough, Slough Level: Non-Viable Tissue Debridement Description: Selective/Open Wound Instrument: Curette Bleeding: Minimum Hemostasis Achieved: Pressure End Time: 14:40 Procedural Pain: 0 Post Procedural Pain: 0 Response to Treatment: Procedure was tolerated well Level of Consciousness (Post- Awake and Alert procedure): Post Debridement Measurements of Total Wound Length: (cm) 0.4 Width: (cm) 1 Depth: (cm) 0.1 Volume:  (cm) 0.031 Character of Wound/Ulcer Post Debridement: Improved Severity of Tissue Post Debridement: Fat layer exposed Post Procedure Diagnosis Same as Pre-procedure Notes Scribed for Dr. Celine [name] by J.Scotton Electronic Signature(s) Signed: 06/05/2022 4:59:27 PM By: Misty Catholic RN Signed: 06/05/2022 5:04:49 PM By: Misty Maudlin MD FACS Entered By: [name] on 06/05/2022 14:43:32 -------------------------------------------------------------------------------- HPI Details Patient Name: Date of Service: [name], [name] 06/05/2022 1:30 PM Medical Record Number: [id number] Patient Account Number: [id number] Date of Birth/Sex: Treating RN: [birth year] (77 y.o. F) Primary Care  Provider: Kathlene [name] Other Clinician: Referring Provider: Treating Provider/Extender: [name] in Treatment: 13 History of Present Illness HPI Description: ADMISSION 03/01/2022 This is a 77 year old otherwise fairly healthy woman (not diabetic, not obese, does not smoke) who presents to the clinic with an ulcer on her right posterior leg. She has a history of a liposarcoma excised in 1973. The patient is not sure but believes she received radiation therapy to the location. She did have a skin graft. About 6 months ago, she developed a small ulcer in the area that seemed to start as an abscess. She has had an open wound in that area since then. A shave biopsy was taken by a dermatologist that showed inflammation without evidence of malignancy. She has been applying Vaseline to the site along with [name], [name] ([id number]) [id number] Page 3 of 11 periwound TCA. ABI in clinic today is 0.94. Due to the failure of the wound to heal properly, she has been referred to the wound care center for further evaluation and management. 03/09/2022: The tissue on the wound is protruding further from the skin surface. It does not have a typical appearance consistent with hypertrophic  granulation tissue. I am concerned that it may represent malignancy. 03/17/2022: Fortunately, the biopsy that I took last week was negative for malignancy. It returned as consistent with an ulcer and inflamed granulation tissue. She still has hypertrophic granulation tissue at the site and some slough accumulation on the surface. No significant change in the wound dimensions. 03/23/2022: The wound measured smaller today and the hypertrophic granulation tissue is less prominent. Due to the unusual shape of her leg from old surgery, however, there has been some friction from her compressive wrap. 03/31/2022: The wound is slightly smaller today. There is light slough on the surface. There is still some hypertrophic granulation tissue present. 04/12/2022: She has a new wound near the top of her surgical defect. Where the skin rolls over, there has been some friction and abrasion that has resulted in tissue breakdown. There is slough buildup on the surface. The original wound is smaller and there is epithelialization around the borders. There is slough buildup on the surface again. She has been approved for TheraSkin but it is unclear what her financial responsibility would be at this point. We are still working on determining that. 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 04/17/2022: Both posterior leg wounds are smaller today. They both have slough accumulation. She had her Mohs surgery and there is a defect on her anterior tibial surface. I spoke with her dermatologist and we are going to assume care for this wound. There is a layer of rubbery slough on this wound and it appears a little dry. 04/24/2022: All 3 wounds have a thick layer of rubbery slough on them. There is hypertrophic granulation tissue underneath this on both the anterior tibial surface wound and the posterior Achilles wound. She has a wound on her right forearm that was  cultured by her dermatologist and grew out heavy Serratia marcescens and moderate Staph aureus. Apparently they are just having her apply topical mupirocin to the site because she has previously had a reaction to moxifloxacin, which is apparently the antibiotic they wanted to use. She asks if I have any other recommendations. 05/01/2022: All 3 wounds look better this week. The hypertrophic granulation tissue has resolved on her anterior tibia and posterior Achilles wound. The wounds are all smaller. They  have some slough accumulation. The moisture balance on the anterior tibial wound is good; the posterior wounds are a bit dry. 05/08/2022: The anterior tibial wound is smaller without any hypertrophic granulation tissue. There is a little bit of slough buildup. There is more slough on the posterior Achilles wound. The posterior wound under the flap of skin is a little bit macerated. 05/15/2022: The anterior tibial wound is healed. The posterior Achilles wound has heavy slough buildup. The posterior wound under the flap of skin continues to be macerated with slough accumulation. 05/29/2022: We are still dealing with a lot of moisture accumulation under the skin flap. There was an odor coming from the wound today when her dressing was removed. The Achilles wound has slough accumulation once again. 06/05/2022: Unfortunately, she seems to have had an allergic reaction to some component of her dressing, possibly the medium for the TCA. She has broken out on her entire lower leg. There has been more moisture accumulation under the flap of tissue on her posterior leg and a linear ulcer has opened just caudal to the existing ulcer in this site. There is slough on the Achilles wound, but there is also more epithelialization. Electronic Signature(s) Signed: 06/05/2022 4:09:55 PM By: Misty Maudlin MD FACS Previous Signature: 06/05/2022 3:52:20 PM Version By: Misty Maudlin MD FACS Entered By: [name] on  06/05/2022 16:09:55 -------------------------------------------------------------------------------- Physical Exam Details Patient Name: Date of Service: [name], [name] 06/05/2022 1:30 PM Medical Record Number: [id number] Patient Account Number: [id number] Date of Birth/Sex: Treating RN: 06/30/1945 (77 y.o. F) Primary Care Provider: Kathlene [name] Other Clinician: Referring Provider: Treating Provider/Extender: [name] in Treatment: 13 Constitutional Hypertensive, asymptomatic. . . . no acute distress. Respiratory Normal work of breathing on room air. Notes 06/05/2022: Unfortunately, she seems to have had an allergic reaction to some component of her dressing, possibly the medium for the TCA. She has broken out on her entire lower leg. There has been more moisture accumulation under the flap of tissue on her posterior leg and a linear ulcer has opened just caudal to the existing ulcer in this site. There is slough on the Achilles wound, but there is also more epithelialization. Electronic Signature(s) Signed: 06/05/2022 4:12:30 PM By: Misty Maudlin MD FACS Entered By: [name] on 06/05/2022 16:12:30 Physician Orders Details -------------------------------------------------------------------------------- [name] ([id number]) [id number] Page 4 of 11 Patient Name: Date of Service: [name], [name] 06/05/2022 1:30 PM Medical Record Number: [id number] Patient Account Number: [id number] Date of Birth/Sex: Treating RN: Jul 31, 1945 (77 y.o. [name] Primary Care Provider: Kathlene [name] Other Clinician: Referring Provider: Treating Provider/Extender: [name] in Treatment: 13 Verbal / Phone Orders: No Diagnosis Coding ICD-10 Coding Code Description [phone] Non-pressure chronic ulcer of other part of right lower leg with fat layer exposed L97.818 Non-pressure chronic ulcer of other part of right lower leg  with other specified severity I10 Essential (primary) hypertension Z85.831 Personal history of malignant neoplasm of soft tissue Z92.3 Personal history of irradiation Follow-up Appointments ppointment in 1 week. - Dr. Celine [name] Room 3 Return A Anesthetic (In clinic) Topical Lidocaine 4% applied to wound bed Bathing/ Shower/ Hygiene May shower with protection but do not get wound dressing(s) wet. Protect dressing(s) with water repellant cover (for example, large plastic bag) or a cast cover and may then take shower. - Please do not get leg wraps wet on Right Leg. May use a cast protector on right leg- can purchase from  [place]; Medical supply store; [facility] etc. the cast protector costs approximately $18-$29 Edema Control - Lymphedema / SCD / Other void standing for long periods of time. - Rest and elevate legs throughout the day. A Moisturize legs daily. - Use a lotion or moisturizer for example :Sween,Eucerin;Aquaphor etc. Wound Treatment Wound #1 - Achilles Wound Laterality: Right Cleanser: Soap and Water 1 x Per Week/30 Days Discharge Instructions: May shower and wash wound with dial antibacterial soap and water prior to dressing change. Cleanser: Wound Cleanser 1 x Per Week/30 Days Discharge Instructions: Cleanse the wound with wound cleanser prior to applying a clean dressing using gauze sponges, not tissue or cotton balls. Peri-Wound Care: Triamcinolone 15 (g) 1 x Per Week/30 Days Discharge Instructions: Use triamcinolone 15 (g) as directed Peri-Wound Care: Zinc Oxide Ointment 30g tube 1 x Per Week/30 Days Discharge Instructions: Apply Zinc Oxide to periwound with each dressing change Peri-Wound Care: Sween Lotion (Moisturizing lotion) 1 x Per Week/30 Days Discharge Instructions: Apply moisturizing lotion as directed Prim Dressing: Maxorb Extra Ag+ Alginate Dressing, 2x2 (in/in) 1 x Per Week/30 Days ary Discharge Instructions: Apply to wound bed as instructed Secondary Dressing: Zetuvit  Plus Silicone Border Dressing 4x4 (in/in) 1 x Per Week/30 Days Discharge Instructions: Apply silicone border over primary dressing as directed. Compression Wrap: ThreePress (3 layer compression wrap) 1 x Per Week/30 Days Discharge Instructions: Apply three layer compression as directed. Compression Wrap: Netting,Tubular #5 1 x Per Week/30 Days Wound #2 - Lower Leg Wound Laterality: Right, Posterior Cleanser: Soap and Water 1 x Per Week/30 Days Discharge Instructions: May shower and wash wound with dial antibacterial soap and water prior to dressing change. Cleanser: Wound Cleanser 1 x Per Week/30 Days Discharge Instructions: Cleanse the wound with wound cleanser prior to applying a clean dressing using gauze sponges, not tissue or cotton balls. Peri-Wound Care: Triamcinolone 15 (g) 1 x Per Week/30 Days Discharge Instructions: Use triamcinolone 15 (g) as directed Peri-Wound Care: Zinc Oxide Ointment 30g tube 1 x Per Week/30 Days Discharge Instructions: Apply Zinc Oxide to periwound with each dressing change Peri-Wound Care: Sween Lotion (Moisturizing lotion) 1 x Per Week/30 Days EMMAH, BUCHERT ([id number]) [id number] Page 5 of 11 Discharge Instructions: Apply moisturizing lotion as directed Prim Dressing: Maxorb Extra Ag+ Alginate Dressing, 2x2 (in/in) 1 x Per Week/30 Days ary Discharge Instructions: Apply to wound bed as instructed Secondary Dressing: Zetuvit Plus Silicone Border Dressing 4x4 (in/in) 1 x Per Week/30 Days Discharge Instructions: Apply silicone border over primary dressing as directed. Compression Wrap: ThreePress (3 layer compression wrap) 1 x Per Week/30 Days Discharge Instructions: Apply three layer compression as directed. Compression Wrap: Netting,Tubular #5 1 x Per Week/30 Days Wound #4 - Lower Leg Wound Laterality: Right, Posterior, Distal Cleanser: Soap and Water 1 x Per Week/30 Days Discharge Instructions: May shower and wash wound with  dial antibacterial soap and water prior to dressing change. Cleanser: Wound Cleanser 1 x Per Week/30 Days Discharge Instructions: Cleanse the wound with wound cleanser prior to applying a clean dressing using gauze sponges, not tissue or cotton balls. Peri-Wound Care: Triamcinolone 15 (g) 1 x Per Week/30 Days Discharge Instructions: Use triamcinolone 15 (g) as directed Peri-Wound Care: Zinc Oxide Ointment 30g tube 1 x Per Week/30 Days Discharge Instructions: Apply Zinc Oxide to periwound with each dressing change Peri-Wound Care: Sween Lotion (Moisturizing lotion) 1 x Per Week/30 Days Discharge Instructions: Apply moisturizing lotion as directed Prim Dressing: Maxorb Extra Ag+ Alginate Dressing, 2x2 (in/in) 1 x Per Week/30 Days ary  Discharge Instructions: Apply to wound bed as instructed Secondary Dressing: Zetuvit Plus Silicone Border Dressing 4x4 (in/in) 1 x Per Week/30 Days Discharge Instructions: Apply silicone border over primary dressing as directed. Compression Wrap: ThreePress (3 layer compression wrap) 1 x Per Week/30 Days Discharge Instructions: Apply three layer compression as directed. Compression Wrap: Netting,Tubular #5 1 x Per Week/30 Days Electronic Signature(s) Signed: 06/05/2022 5:04:49 PM By: Misty Maudlin MD FACS Entered By: [name] on 06/05/2022 16:14:10 -------------------------------------------------------------------------------- Problem List Details Patient Name: Date of Service: [name], [name] 06/05/2022 1:30 PM Medical Record Number: [id number] Patient Account Number: [id number] Date of Birth/Sex: Treating RN: 1945-06-16 (77 y.o. F) Primary Care Provider: Kathlene [name] Other Clinician: Referring Provider: Treating Provider/Extender: [name] in Treatment: 13 Active Problems ICD-10 Encounter Code Description Active Date MDM Diagnosis L97.812 Non-pressure chronic ulcer of other part of right lower leg with fat layer 03/01/2022  No Yes exposed L97.818 Non-pressure chronic ulcer of other part of right lower leg with other specified 04/17/2022 No Yes severity I10 Essential (primary) hypertension 03/01/2022 No Yes [name], [name] ([id number]) [phone].pdf Page 6 of [phone] Personal history of malignant neoplasm of soft tissue 03/01/2022 No Yes Z92.3 Personal history of irradiation 03/01/2022 No Yes Inactive Problems Resolved Problems Electronic Signature(s) Signed: 06/05/2022 3:24:32 PM By: Misty Maudlin MD FACS Entered By: [name] on 06/05/2022 15:24:32 -------------------------------------------------------------------------------- Progress Note Details Patient Name: Date of Service: [place], Tenek[place] V. 06/05/2022 1:30 PM Medical Record Number: [id number] Patient Account Number: [id number] Date of Birth/Sex: Treating RN: [birth year] (77 y.o. F) Primary Care Provider: Kathlene [name] Other Clinician: Referring Provider: Treating Provider/Extender: [name] in Treatment: 13 Subjective Chief Complaint Information obtained from Patient Patient seen for complaints of Non-Healing Wound. History of Present Illness (HPI) ADMISSION 03/01/2022 This is a 77 year old otherwise fairly healthy woman (not diabetic, not obese, does not smoke) who presents to the clinic with an ulcer on her right posterior leg. She has a history of a liposarcoma excised in 1973. The patient is not sure but believes she received radiation therapy to the location. She did have a skin graft. About 6 months ago, she developed a small ulcer in the area that seemed to start as an abscess. She has had an open wound in that area since then. A shave biopsy was taken by a dermatologist that showed inflammation without evidence of malignancy. She has been applying Vaseline to the site along with periwound TCA. ABI in clinic today is 0.94. Due to the failure of the wound to heal properly, she has been  referred to the wound care center for further evaluation and management. 03/09/2022: The tissue on the wound is protruding further from the skin surface. It does not have a typical appearance consistent with hypertrophic granulation tissue. I am concerned that it may represent malignancy. 03/17/2022: Fortunately, the biopsy that I took last week was negative for malignancy. It returned as consistent with an ulcer and inflamed granulation tissue. She still has hypertrophic granulation tissue at the site and some slough accumulation on the surface. No significant change in the wound dimensions. 03/23/2022: The wound measured smaller today and the hypertrophic granulation tissue is less prominent. Due to the unusual shape of her leg from old surgery, however, there has been some friction from her compressive wrap. 03/31/2022: The wound is slightly smaller today. There is light slough on the surface. There is still some hypertrophic granulation tissue present. 04/12/2022: She has a new wound  near the top of her surgical defect. Where the skin rolls over, there has been some friction and abrasion that has resulted in tissue breakdown. There is slough buildup on the surface. The original wound is smaller and there is epithelialization around the borders. There is slough buildup on the surface again. She has been approved for TheraSkin but it is unclear what her financial responsibility would be at this point. We are still working on determining that. 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 04/17/2022: Both posterior leg wounds are smaller today. They both have slough accumulation. She had her Mohs surgery and there is a defect on her anterior tibial surface. I spoke with her dermatologist and we are going to assume care for this wound. There is a layer of rubbery slough on this wound and it appears a little dry. 04/24/2022: All 3 wounds have a  thick layer of rubbery slough on them. There is hypertrophic granulation tissue underneath this on both the anterior tibial surface wound and the posterior Achilles wound. She has a wound on her right forearm that was cultured by her dermatologist and grew out heavy Serratia marcescens and moderate Staph aureus. Apparently they are just having her apply topical mupirocin to the site because she has previously had a reaction to moxifloxacin, which is apparently the antibiotic they wanted to use. She asks if I have any other recommendations. 05/01/2022: All 3 wounds look better this week. The hypertrophic granulation tissue has resolved on her anterior tibia and posterior Achilles wound. The wounds are all smaller. They have some slough accumulation. The moisture balance on the anterior tibial wound is good; the posterior wounds are a bit dry. 05/08/2022: The anterior tibial wound is smaller without any hypertrophic granulation tissue. There is a little bit of slough buildup. There is more slough on the posterior Achilles wound. The posterior wound under the flap of skin is a little bit macerated. 05/15/2022: The anterior tibial wound is healed. The posterior Achilles wound has heavy slough buildup. The posterior wound under the flap of skin continues to be macerated with slough accumulation. 05/29/2022: We are still dealing with a lot of moisture accumulation under the skin flap. There was an odor coming from the wound today when her dressing was [name], WILLOCK ([id number]) [id number] Page 7 of 11 removed. The Achilles wound has slough accumulation once again. 06/05/2022: Unfortunately, she seems to have had an allergic reaction to some component of her dressing, possibly the medium for the TCA. She has broken out on her entire lower leg. There has been more moisture accumulation under the flap of tissue on her posterior leg and a linear ulcer has opened just caudal to the existing  ulcer in this site. There is slough on the Achilles wound, but there is also more epithelialization. Patient History Information obtained from Patient. Family History Unknown History. Social History Never smoker, Marital Status - Married, Alcohol Use - Never, Drug Use - No History, Caffeine Use - Never. Medical History Respiratory Patient has history of Asthma - Childhood asthma Cardiovascular Patient has history of Hypertension Oncologic Patient has history of Received Radiation - R/T Liposarcoma in 1972 Hospitalization/Surgery History - Left Breast Lumpectomy;Tonsillectomy ; Squamous cell carcinoma; Hysterectomy;Right Leg liposarcoma. Medical A Surgical History Notes nd Eyes Pt. 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 no acute distress. Vitals Time Taken: 1:38 AM, Height: 60 in, Weight: 126 lbs, BMI: 24.6, Temperature: 98.2 F, Pulse: 68 bpm, Respiratory Rate: 20 breaths/min, Blood Pressure: 163/72 mmHg. Respiratory Normal work of breathing on room air. General Notes: 06/05/2022: Unfortunately, she seems to have had an allergic reaction to some component of her dressing, possibly the medium for the TCA. She has broken out on her entire lower leg. There has been more moisture accumulation under the flap of tissue on her posterior leg and a linear ulcer has opened just caudal to the existing ulcer in this site. There is slough on the Achilles wound, but there is also more epithelialization. Integumentary (Hair,  Skin) Wound #1 status is Open. Original cause of wound was Gradually Appeared. The date acquired was: 08/30/2021. The wound has been in treatment 13 weeks. The wound is located on the Right Achilles. The wound measures 2cm length x 2.3cm width x 0.1cm depth; 3.613cm^2 area and 0.361cm^3 volume. There is Fat Layer (Subcutaneous Tissue) exposed. There is no tunneling or undermining noted. There is a medium amount of serosanguineous drainage noted. The wound margin is distinct with the outline attached to the wound base. There is small (1-33%) red, hyper - granulation within the wound bed. There is a large (67- 100%) amount of necrotic tissue within the wound bed including Adherent Slough. The periwound skin appearance had no abnormalities noted for texture. The periwound skin appearance had no abnormalities noted for moisture. The periwound skin appearance exhibited: Rubor. The periwound skin appearance did not exhibit: Atrophie Blanche, Cyanosis, Ecchymosis, Hemosiderin Staining, Mottled, Pallor, Erythema. Periwound temperature was noted as No Abnormality. Wound #2 status is Open. Original cause of wound was Shear/Friction. The date acquired was: 04/08/2022. The wound has been in treatment 7 weeks. The wound is located on the Right,Posterior Lower Leg. The wound measures 0.4cm length x 1cm width x 0.1cm depth; 0.314cm^2 area and 0.031cm^3 volume. There is Fat Layer (Subcutaneous Tissue) exposed. There is no tunneling or undermining noted. There is a medium amount of serosanguineous drainage noted. The wound margin is distinct with the outline attached to the wound base. There is medium (34-66%) pink granulation within the wound bed. There is a medium (34- 66%) amount of necrotic tissue within the wound bed including Adherent Slough. The periwound skin appearance had no abnormalities noted for texture. The periwound skin appearance had no abnormalities noted for moisture. The periwound skin appearance did  not exhibit: Atrophie Blanche, Cyanosis, Ecchymosis, Hemosiderin Staining, Mottled, Pallor, Rubor, Erythema. Periwound temperature was noted as No Abnormality. Wound #4 status is Open. Original cause of wound was Gradually Appeared. The date acquired was: 06/01/2022. The wound is located on the Right,Distal,Posterior Lower Leg. The wound measures 1cm length x 0.4cm width x 0.2cm depth; 0.314cm^2 area and 0.063cm^3 volume. There is Fat Layer (Subcutaneous Tissue) exposed. There is no tunneling or undermining noted. There is a medium amount of serosanguineous drainage noted. The wound margin is distinct with the outline attached to the wound base. There is medium (34-66%) pink granulation within the wound bed. There is a medium (34-66%) amount of JEANETH, RECKLING V ([id number]) [phone].pdf Page 8 of 11 necrotic tissue within the wound bed including Adherent Slough. The periwound skin appearance had no abnormalities noted for texture. The periwound skin appearance had no  abnormalities noted for moisture. The periwound skin appearance had no abnormalities noted for color. Periwound temperature was noted as No Abnormality. Assessment Active Problems ICD-10 Non-pressure chronic ulcer of other part of right lower leg with fat layer exposed Non-pressure chronic ulcer of other part of right lower leg with other specified severity Essential (primary) hypertension Personal history of malignant neoplasm of soft tissue Personal history of irradiation Procedures Wound #1 Pre-procedure diagnosis of Wound #1 is an Abscess located on the Right Achilles . There was a Selective/Open Wound Non-Viable Tissue Debridement with a total area of 4.6 sq cm performed by Misty Maudlin, MD. With the following instrument(s): Curette to remove Non-Viable tissue/material. Material removed includes [facility] after achieving pain control using Lidocaine 4% Topical Solution. No specimens were taken. A time out  was conducted at 14:39, prior to the start of the procedure. A Minimum amount of bleeding was controlled with Pressure. The procedure was tolerated well with a pain level of 0 throughout and a pain level of 0 following the procedure. Post Debridement Measurements: 2cm length x 2.3cm width x 0.1cm depth; 0.361cm^3 volume. Character of Wound/Ulcer Post Debridement is improved. Post procedure Diagnosis Wound #1: Same as Pre-Procedure General Notes: Scribed for Dr. Celine [name] by J.Scotton. Wound #2 Pre-procedure diagnosis of Wound #2 is a Venous Leg Ulcer located on the Right,Posterior Lower Leg .Severity of Tissue Pre Debridement is: Fat layer exposed. There was a Selective/Open Wound Non-Viable Tissue Debridement with a total area of 0.4 sq cm performed by Misty Maudlin, MD. With the following instrument(s): Curette to remove Non-Viable tissue/material. Material removed includes [facility] after achieving pain control using Lidocaine 4% Topical Solution. No specimens were taken. A time out was conducted at 14:39, prior to the start of the procedure. A Minimum amount of bleeding was controlled with Pressure. The procedure was tolerated well with a pain level of 0 throughout and a pain level of 0 following the procedure. Post Debridement Measurements: 0.4cm length x 1cm width x 0.1cm depth; 0.031cm^3 volume. Character of Wound/Ulcer Post Debridement is improved. Severity of Tissue Post Debridement is: Fat layer exposed. Post procedure Diagnosis Wound #2: Same as Pre-Procedure General Notes: Scribed for Dr. Celine [name] by J.Scotton. Plan Follow-up Appointments: Return Appointment in 1 week. - Dr. Celine [name] Room 3 Anesthetic: (In clinic) Topical Lidocaine 4% applied to wound bed Bathing/ Shower/ Hygiene: May shower with protection but do not get wound dressing(s) wet. Protect dressing(s) with water repellant cover (for example, large plastic bag) or a cast cover and may then take shower. - Please do not get leg  wraps wet on Right Leg. May use a cast protector on right leg- can purchase from [employer]; Medical supply store; [facility] etc. the cast protector costs approximately $18-$29 Edema Control - Lymphedema / SCD / Other: Avoid standing for long periods of time. - Rest and elevate legs throughout the day. Moisturize legs daily. - Use a lotion or moisturizer for example :Sween,Eucerin;Aquaphor etc. WOUND #1: - Achilles Wound Laterality: Right Cleanser: Soap and Water 1 x Per Week/30 Days Discharge Instructions: May shower and wash wound with dial antibacterial soap and water prior to dressing change. Cleanser: Wound Cleanser 1 x Per Week/30 Days Discharge Instructions: Cleanse the wound with wound cleanser prior to applying a clean dressing using gauze sponges, not tissue or cotton balls. Peri-Wound Care: Triamcinolone 15 (g) 1 x Per Week/30 Days Discharge Instructions: Use triamcinolone 15 (g) as directed Peri-Wound Care: Zinc Oxide Ointment 30g tube 1 x Per Week/30 Days Discharge Instructions: Apply  Zinc Oxide to periwound with each dressing change Peri-Wound Care: Sween Lotion (Moisturizing lotion) 1 x Per Week/30 Days Discharge Instructions: Apply moisturizing lotion as directed Prim Dressing: Maxorb Extra Ag+ Alginate Dressing, 2x2 (in/in) 1 x Per Week/30 Days ary Discharge Instructions: Apply to wound bed as instructed Secondary Dressing: Zetuvit Plus Silicone Border Dressing 4x4 (in/in) 1 x Per Week/30 Days Discharge Instructions: Apply silicone border over primary dressing as directed. Com pression Wrap: ThreePress (3 layer compression wrap) 1 x Per Week/30 Days Discharge Instructions: Apply three layer compression as directed. Com pression Wrap: Netting,Tubular #5 1 x Per Week/30 Days WOUND #2: - Lower Leg Wound Laterality: Right, Posterior Cleanser: Soap and Water 1 x Per Week/30 Days Discharge Instructions: May shower and wash wound with dial antibacterial soap and water prior to dressing  change. Cleanser: Wound Cleanser 1 x Per Week/30 Days Discharge Instructions: Cleanse the wound with wound cleanser prior to applying a clean dressing using gauze sponges, not tissue or cotton balls. Peri-Wound Care: Triamcinolone 15 (g) 1 x Per Week/30 Days Discharge Instructions: Use triamcinolone 15 (g) as directed SIERRIA, ONOFREY ([id number]) [id number] Page 9 of 11 Peri-Wound Care: Zinc Oxide Ointment 30g tube 1 x Per Week/30 Days Discharge Instructions: Apply Zinc Oxide to periwound with each dressing change Peri-Wound Care: Sween Lotion (Moisturizing lotion) 1 x Per Week/30 Days Discharge Instructions: Apply moisturizing lotion as directed Prim Dressing: Maxorb Extra Ag+ Alginate Dressing, 2x2 (in/in) 1 x Per Week/30 Days ary Discharge Instructions: Apply to wound bed as instructed Secondary Dressing: Zetuvit Plus Silicone Border Dressing 4x4 (in/in) 1 x Per Week/30 Days Discharge Instructions: Apply silicone border over primary dressing as directed. Com pression Wrap: ThreePress (3 layer compression wrap) 1 x Per Week/30 Days Discharge Instructions: Apply three layer compression as directed. Com pression Wrap: Netting,Tubular #5 1 x Per Week/30 Days WOUND #4: - Lower Leg Wound Laterality: Right, Posterior, Distal Cleanser: Soap and Water 1 x Per Week/30 Days Discharge Instructions: May shower and wash wound with dial antibacterial soap and water prior to dressing change. Cleanser: Wound Cleanser 1 x Per Week/30 Days Discharge Instructions: Cleanse the wound with wound cleanser prior to applying a clean dressing using gauze sponges, not tissue or cotton balls. Peri-Wound Care: Triamcinolone 15 (g) 1 x Per Week/30 Days Discharge Instructions: Use triamcinolone 15 (g) as directed Peri-Wound Care: Zinc Oxide Ointment 30g tube 1 x Per Week/30 Days Discharge Instructions: Apply Zinc Oxide to periwound with each dressing change Peri-Wound Care: Sween Lotion  (Moisturizing lotion) 1 x Per Week/30 Days Discharge Instructions: Apply moisturizing lotion as directed Prim Dressing: Maxorb Extra Ag+ Alginate Dressing, 2x2 (in/in) 1 x Per Week/30 Days ary Discharge Instructions: Apply to wound bed as instructed Secondary Dressing: Zetuvit Plus Silicone Border Dressing 4x4 (in/in) 1 x Per Week/30 Days Discharge Instructions: Apply silicone border over primary dressing as directed. Com pression Wrap: ThreePress (3 layer compression wrap) 1 x Per Week/30 Days Discharge Instructions: Apply three layer compression as directed. Com pression Wrap: Netting,Tubular #5 1 x Per Week/30 Days 06/05/2022: Unfortunately, she seems to have had an allergic reaction to some component of her dressing, possibly the medium for the TCA. She has broken out on her entire lower leg. There has been more moisture accumulation under the flap of tissue on her posterior leg and a linear ulcer has opened just caudal to the existing ulcer in this site. There is slough on the Achilles wound, but there is also more epithelialization. I used a curette to debride  slough from all of her wounds. I am going to use silver alginate to all of the sites, discontinue triamcinolone. Apply zinc oxide to the periwound. We will continue 3 layer compression, but we will use cotton as the first layer to try and alleviate the skin irritation. Follow-up in 1 week. Electronic Signature(s) Signed: 06/05/2022 4:15:51 PM By: Misty Maudlin MD FACS Entered By: [name] on 06/05/2022 16:15:51 -------------------------------------------------------------------------------- HxROS Details Patient Name: Date of Service: [name], [name] 06/05/2022 1:30 PM Medical Record Number: [id number] Patient Account Number: [id number] Date of Birth/Sex: Treating RN: [birth year] (77 y.o. 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 Respiratory Medical History: Positive for: Asthma - Childhood asthma Cardiovascular Medical History: Positive for: Hypertension Past Medical History Notes: Hx: Angio-edema r/t Bee venom; Hyperlipidemia Endocrine Medical History: Past Medical History NotesKYSA, HECKMANN ([id number]) [id number] Page 10 of 11 Hx: hypothyroidism Immunological Medical History: Past Medical History Notes: Hx: Currently being treated with Immunotherapy (r/t Bee-Venom) Integumentary (Skin) Medical History: Past Medical History Notes: Hx: Urticaria Musculoskeletal Medical History: Past Medical History Notes: Hx: Osteoporosis Oncologic Medical History: Positive for: Received Radiation - R/T Liposarcoma in 1972 Past Medical History Notes: 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)-Biopsy taken to rule out squamous cell carcinoma on right arm and right leg (02/24/22), still pending results Immunizations Pneumococcal Vaccine: Received Pneumococcal Vaccination: Yes Received Pneumococcal Vaccination On or After 60th Birthday: Yes Implantable Devices No devices added Hospitalization / Surgery History Type of Hospitalization/Surgery Left Breast Lumpectomy;Tonsillectomy ; Squamous cell carcinoma; Hysterectomy;Right Leg liposarcoma Family and Social History Unknown History: Yes; Never smoker; Marital Status - Married; Alcohol Use: Never; Drug Use: No History; Caffeine Use: Never; Financial Concerns: No; Food, Clothing or Shelter Needs: No; Support System Lacking: No; Transportation Concerns: No Electronic Signature(s) Signed: 06/05/2022 5:04:49 PM By: Misty Maudlin MD FACS Entered By: [name] on 06/05/2022 16:12:03 -------------------------------------------------------------------------------- SuperBill  Details Patient Name: Date of Service: [name], PIHU PROFETA 06/05/2022 Medical Record Number: [id number] Patient Account Number: [id number] Date of Birth/Sex: Treating RN: May 19, 1945 (77 y.o. F) Primary Care Provider: Kathlene [name] Other Clinician: Referring Provider: Treating Provider/Extender: Otelia Sergeant Weeks in Treatment: 13 Diagnosis Coding ICD-10 Codes Code Description [phone] Non-pressure chronic ulcer of other part of right lower leg with fat layer exposed L97.818 Non-pressure chronic ulcer of other part of right lower leg with other specified severity I10 Essential (primary) hypertension Z85.831 Personal history of malignant neoplasm of soft tissue Z92.3 Personal history of irradiation Facility Procedures : LENA, STRICKLEN Code: UE V ([id number]) [id number] I Description: [id number] - DEBRIDE WOUND 1ST 20 SQ CM OR < CD-10 Diagnosis Description L97.812 Non-pressure chronic ulcer of other part of right lower leg with fat layer exposed L97.818 Non-pressure chronic ulcer of other part of right lower  leg with other specified sever Modifier: [id number] 1 ity Quantity: Page 11 of 11 Physician Procedures : CPT4 Code Description Modifier [id number] - WC PHYS LEVEL 3 - EST PT 25 ICD-10 Diagnosis Description L97.812 Non-pressure chronic ulcer of other part of right lower leg with fat layer exposed L97.818 Non-pressure chronic ulcer of other part of right  lower leg with other specified severity Z92.3 Personal history of irradiation Z85.831 Personal history of malignant neoplasm of soft tissue Quantity: 1 : [id number] - WC PHYS  DEBR WO ANESTH 20 SQ CM ICD-10 Diagnosis Description [id number] Non-pressure chronic ulcer of other part of right lower leg with fat layer exposed L97.818 Non-pressure chronic ulcer of other part of right lower leg with other  specified severity Quantity: 1 Electronic Signature(s) Signed: 06/05/2022 4:16:12 PM By: Misty Maudlin MD  FACS Entered By: [name] on 06/05/2022 16:16:12

## 2022-06-06 NOTE — Progress Notes (Signed)
AVAEH, GASQUE ([id number]) [id number] Page 1 of 10 Visit Report for 06/05/2022 Arrival Information Details Patient Name: Date of Service: [name], [name] 06/05/2022 1:30 PM Medical Record Number: [id number] Patient Account Number: [id number] Date of Birth/Sex: Treating RN: 1945-09-16 (77 y.o. F) Primary Care Kyren Knick: [name] Other Clinician: Referring Calianna Kim: Treating Conner Muegge/Extender: [name] in Treatment: 13 Visit Information History Since Last Visit All ordered tests and consults were completed: No Patient Arrived: Ambulatory Added or deleted any medications: No Arrival Time: 13:38 Any new allergies or adverse reactions: No Accompanied By: self Had a fall or experienced change in No Transfer Assistance: None activities of daily living that may affect Patient Identification Verified: Yes risk of falls: Secondary Verification Process Completed: Yes Signs or symptoms of abuse/neglect since last visito No Patient Requires Transmission-Based Precautions: No Hospitalized since last visit: No Patient Has Alerts: No Implantable device outside of the clinic excluding No cellular tissue based products placed in the center since last visit: Pain Present Now: No Electronic Signature(s) Signed: 06/05/2022 3:14:26 PM By: Worthy Rancher Entered By: Worthy Rancher on 06/05/2022 13:38:32 -------------------------------------------------------------------------------- Encounter Discharge Information Details Patient Name: Date of Service: [name], [name] 06/05/2022 1:30 PM Medical Record Number: [id number] Patient Account Number: [id number] Date of Birth/Sex: Treating RN: [birth year] (77 y.o. [name] Primary Care Chalee Hirota: [name] Other Clinician: Referring Khaled Herda: Treating Cypress Fanfan/Extender: [name] in Treatment: 13 Encounter Discharge Information Items Post Procedure Vitals Discharge Condition:  Stable Temperature (F): 98.2 Ambulatory Status: Ambulatory Pulse (bpm): 68 Discharge Destination: Home Respiratory Rate (breaths/min): 20 Transportation: Private Auto Blood Pressure (mmHg): 163/73 Accompanied By: self Schedule Follow-up Appointment: Yes Clinical Summary of Care: Patient Declined Electronic Signature(s) Signed: 06/05/2022 4:59:27 PM By: Dellie Catholic RN Entered By: Dellie Catholic on 06/05/2022 16:46:07 -------------------------------------------------------------------------------- Lower Extremity Assessment Details Patient Name: Date of Service: [name], [name] 06/05/2022 1:30 PM Medical Record Number: [id number] Patient Account Number: [id number] Date of Birth/Sex: Treating RN: Apr 09, 1945 (77 y.o. [name] Primary Care Ashleyann Shoun: [name] Other Clinician: Referring Zaheer Wageman: Treating Riely Oetken/Extender: Otelia Sergeant Weeks in Treatment: 13 Edema Assessment Assessed: [Left: No] [Right: No] M[LeftRAYELYNN, HENTHORN [id number] [[id number] Page 2 of 10] Edema: [Left: Ye] [Right: s] Calf Left: Right: Point of Measurement: 33 cm From Medial Instep 31.5 cm Ankle Left: Right: Point of Measurement: 9 cm From Medial Instep 19 cm Vascular Assessment Pulses: Dorsalis Pedis Palpable: [Right:Yes] Electronic Signature(s) Signed: 06/05/2022 4:59:27 PM By: Dellie Catholic RN Entered By: Dellie Catholic on 06/05/2022 14:22:44 -------------------------------------------------------------------------------- Multi Wound Chart Details Patient Name: Date of Service: [place], [name] 06/05/2022 1:30 PM Medical Record Number: [id number] Patient Account Number: [id number] Date of Birth/Sex: Treating RN: 03/11/1945 (77 y.o. F) Primary Care [name]: [name] Other Clinician: Referring Keyle Doby: Treating Amon Costilla/Extender: [name] in Treatment: 13 Vital Signs Height(in): 60 Pulse(bpm): 80 Weight(lbs):  126 Blood Pressure(mmHg): 163/72 Body Mass Index(BMI): 24.6 Temperature(F): 98.2 Respiratory Rate(breaths/min): 20 [1:Photos:] Right Achilles Right, Posterior Lower Leg Right, Distal, Posterior Lower Leg Wound Location: Gradually Appeared Shear/Friction Gradually Appeared Wounding Event: Abscess Venous Leg Ulcer Lesion Primary Etiology: Asthma, Hypertension, Received Asthma, Hypertension, Received Asthma, Hypertension, Received Comorbid History: Radiation Radiation Radiation 08/30/2021 04/08/2022 06/01/2022 Date Acquired: 13 7 0 Weeks of Treatment: Open Open Open Wound Status: No No No Wound Recurrence: 2x2.3x0.1 0.4x1x0.1 1x0.4x0.2 Measurements L x W x D (cm) 3.613 0.314 0.314 A (cm) : rea 0.361 0.031 0.063  Volume (cm) : 45.90% 55.60% N/A % Reduction in Area: 46.00% 56.30% N/A % Reduction in Volume: Full Thickness Without Exposed Full Thickness Without Exposed Full Thickness Without Exposed Classification: Support Structures Support Structures Support Structures Medium Medium Medium Exudate Amount: Serosanguineous Serosanguineous Serosanguineous Exudate Type: red, brown red, brown red, brown Exudate Color: Distinct, outline attached Distinct, outline attached Distinct, outline attached Wound Margin: Small (1-33%) Medium (34-66%) Medium (34-66%) Granulation Amount: Red, Hyper-granulation Pink Pink Granulation Quality: Large (67-100%) Medium (34-66%) Medium (34-66%) Necrotic Amount: Fat Layer (Subcutaneous Tissue): Yes Fat Layer (Subcutaneous Tissue): Yes Fat Layer (Subcutaneous Tissue): Yes Exposed Structures: Fascia: No Fascia: No Fascia: No ALYCEA, RUDZIK ([id number] Page 3 of 10 Tendon: No Tendon: No Tendon: No Muscle: No Muscle: No Muscle: No Joint: No Joint: No Joint: No Bone: No Bone: No Bone: No Small (1-33%) Medium (34-66%) None Epithelialization: Debridement - Selective/Open Wound Debridement -  Selective/Open Wound N/A Debridement: Pre-procedure Verification/Time Out 14:39 14:39 N/A Taken: Lidocaine 4% Topical Solution Lidocaine 4% Topical Solution N/A Pain Control: [facility] N/A Tissue Debrided: Non-Viable Tissue Non-Viable Tissue N/A Level: 4.6 0.4 N/A Debridement A (sq cm): rea Curette Curette N/A Instrument: Minimum Minimum N/A Bleeding: Pressure Pressure N/A Hemostasis A chieved: 0 0 N/A Procedural Pain: 0 0 N/A Post Procedural Pain: Procedure was tolerated well Procedure was tolerated well N/A Debridement Treatment Response: 2x2.3x0.1 0.4x1x0.1 N/A Post Debridement Measurements L x W x D (cm) 0.361 0.031 N/A Post Debridement Volume: (cm) Excoriation: No Excoriation: No No Abnormalities Noted Periwound Skin Texture: Induration: No Induration: No Callus: No Callus: No Crepitus: No Crepitus: No Rash: No Rash: No Scarring: No Scarring: No Maceration: No Maceration: No No Abnormalities Noted Periwound Skin Moisture: Dry/Scaly: No Dry/Scaly: No Rubor: Yes Atrophie Blanche: No No Abnormalities Noted Periwound Skin Color: Atrophie Blanche: No Cyanosis: No Cyanosis: No Ecchymosis: No Ecchymosis: No Erythema: No Erythema: No Hemosiderin Staining: No Hemosiderin Staining: No Mottled: No Mottled: No Pallor: No Pallor: No Rubor: No No Abnormality No Abnormality No Abnormality Temperature: Debridement Debridement N/A Procedures Performed: Treatment Notes Electronic Signature(s) Signed: 06/05/2022 3:47:54 PM By: Fredirick Maudlin MD FACS Entered By: Fredirick Maudlin on 06/05/2022 15:47:54 -------------------------------------------------------------------------------- Multi-Disciplinary Care Plan Details Patient Name: Date of Service: [name], Sophea [name]. 06/05/2022 1:30 PM Medical Record Number: [id number] Patient Account Number: [id number] Date of Birth/Sex: Treating RN: Jul 19, 1945 (77 y.o. [name] Primary Care Kiandre Spagnolo: [name]  Other Clinician: Referring Loyde Orth: Treating Niccolas Loeper/Extender: [name] in Treatment: 13 Active Inactive Wound/Skin Impairment Nursing Diagnoses: Knowledge deficit related to ulceration/compromised skin integrity Goals: Patient/caregiver will verbalize understanding of skin care regimen Date Initiated: 03/01/2022 Target Resolution Date: 08/03/2022 Goal Status: Active Interventions: Assess ulceration(s) every visit Treatment Activities: Skin care regimen initiated : 03/01/2022 Notes: ISOBEL, RODIS ([id number]) [phone].pdf Page 4 of 10 Electronic Signature(s) Signed: 06/05/2022 4:59:27 PM By: Dellie Catholic RN Entered By: Dellie Catholic on 06/05/2022 16:44:33 -------------------------------------------------------------------------------- Pain Assessment Details Patient Name: Date of Service: Misty DENNIS, LUGAR 06/05/2022 1:30 PM Medical Record Number: [id number] Patient Account Number: [id number] Date of Birth/Sex: Treating RN: 11-Aug-1945 (77 y.o. F) Primary Care Evea Sheek: [name] Other Clinician: Referring Ariyan Sinnett: Treating Rey Dansby/Extender: [name] in Treatment: 13 Active Problems Location of Pain Severity and Description of Pain Patient Has Paino No Site Locations Pain Management and Medication Current Pain Management: Electronic Signature(s) Signed: 06/05/2022 3:14:26 PM By: Worthy Rancher Entered By: Worthy Rancher on 06/05/2022 13:39:21 -------------------------------------------------------------------------------- Patient/Caregiver Education Details Patient Name: Date of  Service: Barbette Or 4/1/2024andnbsp1:30 PM Medical Record Number: [id number] Patient Account Number: [id number] Date of Birth/Gender: Treating RN: [birth year] (77 y.o. [name] Primary Care Physician: [name] Other Clinician: Referring Physician: Treating Physician/Extender: [name] in  Treatment: 13 Education Assessment Education Provided To: Patient Education Topics Provided Wound/Skin Impairment: Methods: Explain/Verbal Responses: Return demonstration correctly Electronic Signature(s) ESABELLA, BLANDING ([id number]) [id number] Page 5 of 10 Signed: 06/05/2022 4:59:27 PM By: Dellie Catholic RN Entered By: Dellie Catholic on 06/05/2022 16:44:48 -------------------------------------------------------------------------------- Wound Assessment Details Patient Name: Date of Service: [name], [name] 06/05/2022 1:30 PM Medical Record Number: [id number] Patient Account Number: [id number] Date of Birth/Sex: Treating RN: 1945-11-30 (77 y.o. [name] Primary Care Everhett Bozard: [name] Other Clinician: Referring Minah Axelrod: Treating [name]/Extender: [name] in Treatment: 13 Wound Status Wound Number: 1 Primary Etiology: Abscess Wound Location: Right Achilles Wound Status: Open Wounding Event: Gradually Appeared Comorbid History: Asthma, Hypertension, Received Radiation Date Acquired: 08/30/2021 Weeks Of Treatment: 13 Clustered Wound: No Photos Wound Measurements Length: (cm) 2 Width: (cm) 2.3 Depth: (cm) 0.1 Area: (cm) 3.613 Volume: (cm) 0.361 % Reduction in Area: 45.9% % Reduction in Volume: 46% Epithelialization: Small (1-33%) Tunneling: No Undermining: No Wound Description Classification: Full Thickness Without Exposed Support Structures Wound Margin: Distinct, outline attached Exudate Amount: Medium Exudate Type: Serosanguineous Exudate Color: red, brown Foul Odor After Cleansing: No Slough/Fibrino Yes Wound Bed Granulation Amount: Small (1-33%) Exposed Structure Granulation Quality: Red, Hyper-granulation Fascia Exposed: No Necrotic Amount: Large (67-100%) Fat Layer (Subcutaneous Tissue) Exposed: Yes Necrotic Quality: Adherent Slough Tendon Exposed: No Muscle Exposed: No Joint Exposed: No Bone  Exposed: No Periwound Skin Texture Texture Color No Abnormalities Noted: Yes No Abnormalities Noted: No Atrophie Blanche: No Moisture Cyanosis: No No Abnormalities Noted: Yes Ecchymosis: No Erythema: No Hemosiderin Staining: No Mottled: No Pallor: No Rubor: Yes Temperature / Pain Temperature: No Abnormality SKYLI, ENGLEMAN [name] ([id number]) [id number] Page 6 of 10 Treatment Notes Wound #1 (Achilles) Wound Laterality: Right Cleanser Soap and Water Discharge Instruction: May shower and wash wound with dial antibacterial soap and water prior to dressing change. Wound Cleanser Discharge Instruction: Cleanse the wound with wound cleanser prior to applying a clean dressing using gauze sponges, not tissue or cotton balls. Peri-Wound Care Triamcinolone 15 (g) Discharge Instruction: Use triamcinolone 15 (g) as directed Zinc Oxide Ointment 30g tube Discharge Instruction: Apply Zinc Oxide to periwound with each dressing change Sween Lotion (Moisturizing lotion) Discharge Instruction: Apply moisturizing lotion as directed Topical Primary Dressing Maxorb Extra Ag+ Alginate Dressing, 2x2 (in/in) Discharge Instruction: Apply to wound bed as instructed Secondary Dressing Zetuvit Plus Silicone Border Dressing 4x4 (in/in) Discharge Instruction: Apply silicone border over primary dressing as directed. Secured With Compression Wrap ThreePress (3 layer compression wrap) Discharge Instruction: Apply three layer compression as directed. Netting,Tubular #5 Compression Stockings Add-Ons Electronic Signature(s) Signed: 06/05/2022 4:59:27 PM By: Dellie Catholic RN Entered By: Dellie Catholic on 06/05/2022 14:26:43 -------------------------------------------------------------------------------- Wound Assessment Details Patient Name: Date of Service: Misty [name], [name] 06/05/2022 1:30 PM Medical Record Number: [id number] Patient Account Number: [id number] Date of Birth/Sex:  Treating RN: [birth year] (77 y.o. [name] Primary Care Dailey Alberson: [name] Other Clinician: Referring Asmaa Tirpak: Treating Emersen Carroll/Extender: Otelia Sergeant Weeks in Treatment: 13 Wound Status Wound Number: 2 Primary Venous Leg Ulcer Etiology: Wound Location: Right, Posterior Lower Leg Wound Open Wounding Event: Shear/Friction Status: Date Acquired: 04/08/2022 Notes: Pt states she started feeling a little sensation under her  wrap Weeks Of Treatment: 7 and now has a new place Clustered Wound: No Comorbid Asthma, Hypertension, Received Radiation History: Photos TIERRIA, MARTUCCI ([id number]) [id number] Page 7 of 10 Wound Measurements Length: (cm) 0.4 Width: (cm) 1 Depth: (cm) 0.1 Area: (cm) 0.314 Volume: (cm) 0.031 % Reduction in Area: 55.6% % Reduction in Volume: 56.3% Epithelialization: Medium (34-66%) Tunneling: No Undermining: No Wound Description Classification: Full Thickness Without Exposed Support Structures Wound Margin: Distinct, outline attached Exudate Amount: Medium Exudate Type: Serosanguineous Exudate Color: red, brown Foul Odor After Cleansing: No Slough/Fibrino Yes Wound Bed Granulation Amount: Medium (34-66%) Exposed Structure Granulation Quality: Pink Fascia Exposed: No Necrotic Amount: Medium (34-66%) Fat Layer (Subcutaneous Tissue) Exposed: Yes Necrotic Quality: Adherent Slough Tendon Exposed: No Muscle Exposed: No Joint Exposed: No Bone Exposed: No Periwound Skin Texture Texture Color No Abnormalities Noted: Yes No Abnormalities Noted: No Atrophie Blanche: No Moisture Cyanosis: No No Abnormalities Noted: Yes Ecchymosis: No Erythema: No Hemosiderin Staining: No Mottled: No Pallor: No Rubor: No Temperature / Pain Temperature: No Abnormality Treatment Notes Wound #2 (Lower Leg) Wound Laterality: Right, Posterior Cleanser Soap and Water Discharge Instruction: May shower and wash wound with  dial antibacterial soap and water prior to dressing change. Wound Cleanser Discharge Instruction: Cleanse the wound with wound cleanser prior to applying a clean dressing using gauze sponges, not tissue or cotton balls. Peri-Wound Care Triamcinolone 15 (g) Discharge Instruction: Use triamcinolone 15 (g) as directed Zinc Oxide Ointment 30g tube Discharge Instruction: Apply Zinc Oxide to periwound with each dressing change Sween Lotion (Moisturizing lotion) Discharge Instruction: Apply moisturizing lotion as directed Topical Primary Dressing Maxorb Extra Ag+ Alginate Dressing, 2x2 (in/in) Discharge Instruction: Apply to wound bed as instructed SULLY, MUTCHLER ([id number] Page 8 of 10 Secondary Dressing Zetuvit Plus Silicone Border Dressing 4x4 (in/in) Discharge Instruction: Apply silicone border over primary dressing as directed. Secured With Compression Wrap ThreePress (3 layer compression wrap) Discharge Instruction: Apply three layer compression as directed. Netting,Tubular #5 Compression Stockings Add-Ons Electronic Signature(s) Signed: 06/05/2022 4:59:27 PM By: Dellie Catholic RN Entered By: Dellie Catholic on 06/05/2022 14:27:30 -------------------------------------------------------------------------------- Wound Assessment Details Patient Name: Date of Service: Misty [name], JACQUELENE WUETHRICH 06/05/2022 1:30 PM Medical Record Number: [id number] Patient Account Number: [id number] Date of Birth/Sex: Treating RN: April 24, 1945 (77 y.o. [name] Primary Care Fatmata Legere: [name] Other Clinician: Referring Drea Jurewicz: Treating Eleonora Peeler/Extender: Otelia Sergeant Weeks in Treatment: 13 Wound Status Wound Number: 4 Primary Etiology: Lesion Wound Location: Right, Distal, Posterior Lower Leg Wound Status: Open Wounding Event: Gradually Appeared Comorbid History: Asthma, Hypertension, Received Radiation Date Acquired: 06/01/2022 Weeks Of  Treatment: 0 Clustered Wound: No Photos Wound Measurements Length: (cm) 1 Width: (cm) 0.4 Depth: (cm) 0.2 Area: (cm) 0.314 Volume: (cm) 0.063 % Reduction in Area: % Reduction in Volume: Epithelialization: None Tunneling: No Undermining: No Wound Description Classification: Full Thickness Without Exposed Support Structures Wound Margin: Distinct, outline attached Exudate Amount: Medium Exudate Type: Serosanguineous Exudate Color: red, brown Foul Odor After Cleansing: No Slough/Fibrino Yes Wound Bed Granulation Amount: Medium (34-66%) Exposed Structure Granulation Quality: Pink Fascia Exposed: No Necrotic Amount: Medium (34-66%) Fat Layer (Subcutaneous Tissue) Exposed: Yes Necrotic Quality: Adherent Slough Tendon Exposed: No Muscle Exposed: No ZURII, [name] ([id number] Page 9 of 10 Joint Exposed: No Bone Exposed: No Periwound Skin Texture Texture Color No Abnormalities Noted: Yes No Abnormalities Noted: Yes Moisture Temperature / Pain No Abnormalities Noted: Yes Temperature: No Abnormality Treatment Notes Wound #4 (Lower Leg) Wound Laterality: Right, Posterior, Distal Cleanser  Soap and Water Discharge Instruction: May shower and wash wound with dial antibacterial soap and water prior to dressing change. Wound Cleanser Discharge Instruction: Cleanse the wound with wound cleanser prior to applying a clean dressing using gauze sponges, not tissue or cotton balls. Peri-Wound Care Triamcinolone 15 (g) Discharge Instruction: Use triamcinolone 15 (g) as directed Zinc Oxide Ointment 30g tube Discharge Instruction: Apply Zinc Oxide to periwound with each dressing change Sween Lotion (Moisturizing lotion) Discharge Instruction: Apply moisturizing lotion as directed Topical Primary Dressing Maxorb Extra Ag+ Alginate Dressing, 2x2 (in/in) Discharge Instruction: Apply to wound bed as instructed Secondary Dressing Zetuvit Plus  Silicone Border Dressing 4x4 (in/in) Discharge Instruction: Apply silicone border over primary dressing as directed. Secured With Compression Wrap ThreePress (3 layer compression wrap) Discharge Instruction: Apply three layer compression as directed. Netting,Tubular #5 Compression Stockings Add-Ons Electronic Signature(s) Signed: 06/05/2022 4:59:27 PM By: Dellie Catholic RN Entered By: Dellie Catholic on 06/05/2022 14:44:05 -------------------------------------------------------------------------------- Vitals Details Patient Name: Date of Service: [name], [name] 06/05/2022 1:30 PM Medical Record Number: [id number] Patient Account Number: [id number] Date of Birth/Sex: Treating RN: 1945-11-24 (77 y.o. F) Primary Care Meyli Boice: [name] Other Clinician: Referring Zan Triska: Treating Iva Montelongo/Extender: [name] in Treatment: 13 Vital Signs Time Taken: 01:38 Temperature (F): 98.2 Height (in): 60 Pulse (bpm): 68 Weight (lbs): 126 Respiratory Rate (breaths/min): 20 Body Mass Index (BMI): 24.6 Blood Pressure (mmHg): 163/72 Reference Range: 80 - 120 mg / dl Electronic Signature(s) JALEY, MONTEALEGRE [name] ([id number]) [id number] Page 10 of 10 Signed: 06/05/2022 3:14:26 PM By: Worthy Rancher Entered By: Worthy Rancher on 06/05/2022 13:39:10

## 2022-06-13 ENCOUNTER — Encounter (HOSPITAL_BASED_OUTPATIENT_CLINIC_OR_DEPARTMENT_OTHER): Payer: Medicare HMO | Admitting: General Surgery

## 2022-06-13 DIAGNOSIS — L97212 Non-pressure chronic ulcer of right calf with fat layer exposed: Secondary | ICD-10-CM | POA: Diagnosis not present

## 2022-06-13 DIAGNOSIS — I10 Essential (primary) hypertension: Secondary | ICD-10-CM | POA: Diagnosis not present

## 2022-06-13 DIAGNOSIS — L97818 Non-pressure chronic ulcer of other part of right lower leg with other specified severity: Secondary | ICD-10-CM | POA: Diagnosis not present

## 2022-06-13 DIAGNOSIS — I872 Venous insufficiency (chronic) (peripheral): Secondary | ICD-10-CM | POA: Diagnosis not present

## 2022-06-13 DIAGNOSIS — E039 Hypothyroidism, unspecified: Secondary | ICD-10-CM | POA: Diagnosis not present

## 2022-06-13 DIAGNOSIS — L97812 Non-pressure chronic ulcer of other part of right lower leg with fat layer exposed: Secondary | ICD-10-CM | POA: Diagnosis not present

## 2022-06-13 DIAGNOSIS — L988 Other specified disorders of the skin and subcutaneous tissue: Secondary | ICD-10-CM | POA: Diagnosis not present

## 2022-06-13 DIAGNOSIS — E785 Hyperlipidemia, unspecified: Secondary | ICD-10-CM | POA: Diagnosis not present

## 2022-06-13 DIAGNOSIS — L02415 Cutaneous abscess of right lower limb: Secondary | ICD-10-CM | POA: Diagnosis not present

## 2022-06-13 DIAGNOSIS — J45909 Unspecified asthma, uncomplicated: Secondary | ICD-10-CM | POA: Diagnosis not present

## 2022-06-13 NOTE — Progress Notes (Signed)
KHALEELAH, [name] ([id number]) [id number] Page 1 of 10 Visit Report for 06/13/2022 Arrival Information Details Patient Name: Date of Service: [name], [name] 06/13/2022 10:45 A M Medical Record Number: [id number] Patient Account Number: [id number] Date of Birth/Sex: Treating RN: 16-Jun-1945 (77 y.o. [name] Primary Care Marjani Kobel: Willow Ora Other Clinician: Referring Emonni Depasquale: Treating Kyree Adriano/Extender: Cresenciano Genre in Treatment: 14 Visit Information History Since Last Visit Added or deleted any medications: No Patient Arrived: Ambulatory Any new allergies or adverse reactions: No Arrival Time: 10:44 Had a fall or experienced change in No Accompanied By: self activities of daily living that may affect Transfer Assistance: None risk of falls: Patient Identification Verified: Yes Signs or symptoms of abuse/neglect since last visito No Secondary Verification Process Completed: Yes Hospitalized since last visit: No Patient Requires Transmission-Based Precautions: No Implantable device outside of the clinic excluding No Patient Has Alerts: No cellular tissue based products placed in the center since last visit: Has Dressing in Place as Prescribed: Yes Has Compression in Place as Prescribed: Yes Pain Present Now: No Electronic Signature(s) Signed: 06/13/2022 3:43:44 PM By: Samuella Bruin Entered By: Samuella Bruin on 06/13/2022 10:45:19 -------------------------------------------------------------------------------- Compression Therapy Details Patient Name: Date of Service: [name], [name] 06/13/2022 10:45 A M Medical Record Number: [id number] Patient Account Number: [id number] Date of Birth/Sex: Treating RN: April 09, 1945 (77 y.o. [name] Primary Care Quinn Quam: Willow Ora Other Clinician: Referring Rishit Burkhalter: Treating Jenicka Coxe/Extender: Cresenciano Genre in Treatment: 14 Compression Therapy Performed  for Wound Assessment: Wound #1 Right Achilles Performed By: Clinician Samuella Bruin, RN Compression Type: Three Layer Post Procedure Diagnosis Same as Pre-procedure Electronic Signature(s) Signed: 06/13/2022 3:43:44 PM By: Samuella Bruin Entered By: Samuella Bruin on 06/13/2022 11:08:17 -------------------------------------------------------------------------------- Encounter Discharge Information Details Patient Name: Date of Service: [name], [name] 06/13/2022 10:45 A M Medical Record Number: [id number] Patient Account Number: [id number] Date of Birth/Sex: Treating RN: 1945/06/22 (77 y.o. [name] Primary Care Dasan Hardman: Willow Ora Other Clinician: Referring Catriona Dillenbeck: Treating Nicosha Struve/Extender: Cresenciano Genre in Treatment: 14 Encounter Discharge Information Items Post Procedure Vitals Discharge Condition: Stable Temperature (F): 98 Ambulatory Status: Ambulatory Pulse (bpm): 66 Discharge Destination: Home Respiratory Rate (breaths/min): 16 Transportation: Private Auto Blood Pressure (mmHg): 155/72 KHAYLEE, MCEVOY ([id number]) [id number] Page 2 of 10 Accompanied By: self Schedule Follow-up Appointment: Yes Clinical Summary of Care: Patient Declined Electronic Signature(s) Signed: 06/13/2022 3:43:44 PM By: Gelene Mink By: Samuella Bruin on 06/13/2022 11:21:58 -------------------------------------------------------------------------------- Lower Extremity Assessment Details Patient Name: Date of Service: [name], [name] 06/13/2022 10:45 A M Medical Record Number: [id number] Patient Account Number: [id number] Date of Birth/Sex: Treating RN: [birth year] (77 y.o. [name] Primary Care Derek Huneycutt: Willow Ora Other Clinician: Referring Sharod Petsch: Treating Judea Fennimore/Extender: Cresenciano Genre in Treatment: 14 Edema Assessment Assessed: [Left: No] [Right: No] Edema: [Left: Ye]  [Right: s] Calf Left: Right: Point of Measurement: 33 cm From Medial Instep 30.4 cm Ankle Left: Right: Point of Measurement: 9 cm From Medial Instep 19.5 cm Vascular Assessment Pulses: Dorsalis Pedis Palpable: [Right:Yes] Electronic Signature(s) Signed: 06/13/2022 3:43:44 PM By: Samuella Bruin Entered By: Samuella Bruin on 06/13/2022 10:55:18 -------------------------------------------------------------------------------- Multi Wound Chart Details Patient Name: Date of Service: [name], [name] 06/13/2022 10:45 A M Medical Record Number: [id number] Patient Account Number: [id number] Date of Birth/Sex: Treating RN: 05/03/1945 (77 y.o. F) Primary Care Fusako Tanabe: Willow Ora Other Clinician: Referring Dasan Hardman: Treating Misty Wages/Extender: Margaret Pyle, Lynne Logan  in Treatment: 14 Vital Signs Height(in): 60 Pulse(bpm): 66 Weight(lbs): 126 Blood Pressure(mmHg): 155/72 Body Mass Index(BMI): 24.6 Temperature(F): 98 Respiratory Rate(breaths/min): 16 [1:Photos:] [4:[id number] Page 3 of 10] Right Achilles Right, Posterior Lower Leg Right, Distal, Posterior Lower Leg Wound Location: Gradually Appeared Shear/Friction Gradually Appeared Wounding Event: Abscess Venous Leg Ulcer Lesion Primary Etiology: Asthma, Hypertension, Received Asthma, Hypertension, Received Asthma, Hypertension, Received Comorbid History: Radiation Radiation Radiation 08/30/2021 04/08/2022 06/01/2022 Date Acquired: 14 8 1  Weeks of Treatment: Open Open Open Wound Status: No No No Wound Recurrence: 0.7x1.6x0.1 0.3x0.5x0.1 0.6x0.2x0.1 Measurements L x W x D (cm) 0.88 0.118 0.094 A (cm) : rea 0.088 0.012 0.009 Volume (cm) : 86.80% 83.30% 70.10% % Reduction in A rea: 86.80% 83.10% 85.70% % Reduction in Volume: Full Thickness Without Exposed Full Thickness Without Exposed Full Thickness Without Exposed Classification: Support Structures Support Structures Support  Structures Medium Medium Medium Exudate A mount: Serosanguineous Serosanguineous Serosanguineous Exudate Type: red, brown red, brown red, brown Exudate Color: Distinct, outline attached Distinct, outline attached Distinct, outline attached Wound Margin: Large (67-100%) Large (67-100%) Small (1-33%) Granulation A mount: Red, Hyper-granulation Pink Pink Granulation Quality: Small (1-33%) Small (1-33%) Large (67-100%) Necrotic A mount: Fat Layer (Subcutaneous Tissue): Yes Fat Layer (Subcutaneous Tissue): Yes Fat Layer (Subcutaneous Tissue): Yes Exposed Structures: Fascia: No Fascia: No Fascia: No Tendon: No Tendon: No Tendon: No Muscle: No Muscle: No Muscle: No Joint: No Joint: No Joint: No Bone: No Bone: No Bone: No Small (1-33%) Large (67-100%) None Epithelialization: Debridement - Selective/Open Wound Debridement - Selective/Open Wound Debridement - Selective/Open Wound Debridement: Pre-procedure Verification/Time Out 11:06 11:06 11:06 Taken: Lidocaine 4% Topical Solution Lidocaine 4% Topical Solution Lidocaine 4% Topical Solution Pain Control: Principal Financial Tissue Debrided: Non-Viable Tissue Non-Viable Tissue Non-Viable Tissue Level: 1.12 0.15 0.12 Debridement A (sq cm): rea Curette Curette Curette Instrument: Minimum Minimum Minimum Bleeding: Pressure Pressure Pressure Hemostasis A chieved: Procedure was tolerated well Procedure was tolerated well Procedure was tolerated well Debridement Treatment Response: 0.7x1.6x0.1 0.3x0.5x0.1 0.6x0.2x0.1 Post Debridement Measurements L x W x D (cm) 0.088 0.012 0.009 Post Debridement Volume: (cm) Excoriation: No Excoriation: No No Abnormalities Noted Periwound Skin Texture: Induration: No Induration: No Callus: No Callus: No Crepitus: No Crepitus: No Rash: No Rash: No Scarring: No Scarring: No Maceration: No Maceration: No No Abnormalities Noted Periwound Skin Moisture: Dry/Scaly:  No Dry/Scaly: No Rubor: Yes Atrophie Blanche: No No Abnormalities Noted Periwound Skin Color: Atrophie Blanche: No Cyanosis: No Cyanosis: No Ecchymosis: No Ecchymosis: No Erythema: No Erythema: No Hemosiderin Staining: No Hemosiderin Staining: No Mottled: No Mottled: No Pallor: No Pallor: No Rubor: No No Abnormality No Abnormality No Abnormality Temperature: Compression Therapy Debridement Debridement Procedures Performed: Debridement Treatment Notes Electronic Signature(s) Signed: 06/13/2022 11:10:37 AM By: Duanne Guess MD FACS Entered By: Duanne Guess on 06/13/2022 11:10:37 -------------------------------------------------------------------------------- Multi-Disciplinary Care Plan Details Patient Name: Date of Service: [name], Tashema V. 06/13/2022 10:45 A M Medical Record Number: [id number] Patient Account Number: [id number] Date of Birth/Sex: Treating RN: 1945-08-21 (77 y.o. [name] Primary Care Yamilette Garretson: Willow Ora Other Clinician: Referring Traxton Kolenda: Treating Jadin Kagel/Extender: Elsie Saas [place], [place] V ([id number]) [id number].pdf Page 4 of 10 Weeks in Treatment: 14 Active Inactive Wound/Skin Impairment Nursing Diagnoses: Knowledge deficit related to ulceration/compromised skin integrity Goals: Patient/caregiver will verbalize understanding of skin care regimen Date Initiated: 03/01/2022 Target Resolution Date: 08/03/2022 Goal Status: Active Interventions: Assess ulceration(s) every visit Treatment Activities: Skin care regimen initiated : 03/01/2022 Notes: Electronic Signature(s) Signed: 06/13/2022 3:43:44 PM By: Samuella Bruin  Entered By: Samuella Bruin on 06/13/2022 11:06:16 -------------------------------------------------------------------------------- Pain Assessment Details Patient Name: Date of Service: [name], [name] 06/13/2022 10:45 A M Medical Record Number: [id number] Patient Account  Number: [id number] Date of Birth/Sex: Treating RN: 09-08-1945 (77 y.o. [name] Primary Care Advith Martine: Willow Ora Other Clinician: Referring Brandelyn Henne: Treating Nehemyah Foushee/Extender: Cresenciano Genre in Treatment: 14 Active Problems Location of Pain Severity and Description of Pain Patient Has Paino No Site Locations Rate the pain. Current Pain Level: 0 Pain Management and Medication Current Pain Management: Electronic Signature(s) Signed: 06/13/2022 3:43:44 PM By: Samuella Bruin Entered By: Samuella Bruin on 06/13/2022 10:45:29 [name] ([id number]) [id number] Page 5 of 10 -------------------------------------------------------------------------------- Patient/Caregiver Education Details Patient Name: Date of Service: Misty TALLIE, PLEWA 4/9/2024andnbsp10:45 A M Medical Record Number: [id number] Patient Account Number: [id number] Date of Birth/Gender: Treating RN: 1945/11/06 (77 y.o. [name] Primary Care Physician: Willow Ora Other Clinician: Referring Physician: Treating Physician/Extender: Cresenciano Genre in Treatment: 14 Education Assessment Education Provided To: Patient Education Topics Provided Wound/Skin Impairment: Methods: Explain/Verbal Responses: Reinforcements needed, State content correctly Electronic Signature(s) Signed: 06/13/2022 3:43:44 PM By: Samuella Bruin Entered By: Samuella Bruin on 06/13/2022 11:06:26 -------------------------------------------------------------------------------- Wound Assessment Details Patient Name: Date of Service: [name], [name] 06/13/2022 10:45 A M Medical Record Number: [id number] Patient Account Number: [id number] Date of Birth/Sex: Treating RN: 29-May-1945 (77 y.o. [name] Primary Care Jodie Cavey: Willow Ora Other Clinician: Referring Aili Casillas: Treating Jayson Waterhouse/Extender: Cresenciano Genre in Treatment:  14 Wound Status Wound Number: 1 Primary Etiology: Abscess Wound Location: Right Achilles Wound Status: Open Wounding Event: Gradually Appeared Comorbid History: Asthma, Hypertension, Received Radiation Date Acquired: 08/30/2021 Weeks Of Treatment: 14 Clustered Wound: No Photos Wound Measurements Length: (cm) 0.7 Width: (cm) 1.6 Depth: (cm) 0.1 Area: (cm) 0.88 Volume: (cm) 0.088 % Reduction in Area: 86.8% % Reduction in Volume: 86.8% Epithelialization: Small (1-33%) Tunneling: No Undermining: No Wound Description Classification: Full Thickness Without Exposed Support Structures Wound Margin: Distinct, outline attached Exudate Amount: Medium Exudate Type: Serosanguineous Exudate Color: red, brown TALIN, DRYMAN V ([id number]) Foul Odor After Cleansing: No Slough/Fibrino Yes [id number] Page 6 of 10 Wound Bed Granulation Amount: Large (67-100%) Exposed Structure Granulation Quality: Red, Hyper-granulation Fascia Exposed: No Necrotic Amount: Small (1-33%) Fat Layer (Subcutaneous Tissue) Exposed: Yes Necrotic Quality: Adherent Slough Tendon Exposed: No Muscle Exposed: No Joint Exposed: No Bone Exposed: No Periwound Skin Texture Texture Color No Abnormalities Noted: Yes No Abnormalities Noted: No Atrophie Blanche: No Moisture Cyanosis: No No Abnormalities Noted: Yes Ecchymosis: No Erythema: No Hemosiderin Staining: No Mottled: No Pallor: No Rubor: Yes Temperature / Pain Temperature: No Abnormality Treatment Notes Wound #1 (Achilles) Wound Laterality: Right Cleanser Soap and Water Discharge Instruction: May shower and wash wound with dial antibacterial soap and water prior to dressing change. Wound Cleanser Discharge Instruction: Cleanse the wound with wound cleanser prior to applying a clean dressing using gauze sponges, not tissue or cotton balls. Peri-Wound Care Ketoconazole Cream 2% Discharge Instruction: Apply Ketoconazole as  directed Zinc Oxide Ointment 30g tube Discharge Instruction: Apply Zinc Oxide to periwound with each dressing change Sween Lotion (Moisturizing lotion) Discharge Instruction: Apply moisturizing lotion as directed Topical Primary Dressing Maxorb Extra Ag+ Alginate Dressing, 2x2 (in/in) Discharge Instruction: Apply to wound bed as instructed Secondary Dressing Zetuvit Plus 4x8 in Discharge Instruction: Apply over primary dressing as directed. Secured With Compression Wrap ThreePress (3 layer compression wrap) Discharge Instruction: Apply three layer compression as directed.  Netting,Tubular #5 Compression Stockings Add-Ons Electronic Signature(s) Signed: 06/13/2022 3:43:44 PM By: Samuella BruinHerrington, Taylor Entered By: Samuella BruinHerrington, Taylor on 06/13/2022 11:00:38 -------------------------------------------------------------------------------- Wound Assessment Details Patient Name: Date of Service: [name], Misty PontiffSUE V. 06/13/2022 10:45 A M Medical Record Number: [id number] Patient Account Number: [id number] Tiburcio PeaMOORE, Jillaine V ([id number]) [id number] Page 7 of 10 Date of Birth/Sex: Treating RN: 05/17/1945 (77 y.o. Fredderick PhenixF) Herrington, Taylor Primary Care Duncan Alejandro: Other Clinician: Willow OraPaz, Jose Referring Dyllen Menning: Treating Damarri Rampy/Extender: Elsie Saasannon, Jennifer Paz, Jose Weeks in Treatment: 14 Wound Status Wound Number: 2 Primary Venous Leg Ulcer Etiology: Wound Location: Right, Posterior Lower Leg Wound Open Wounding Event: Shear/Friction Status: Date Acquired: 04/08/2022 Notes: Pt states she started feeling a little sensation under her wrap Weeks Of Treatment: 8 and now has a new place Clustered Wound: No Comorbid Asthma, Hypertension, Received Radiation History: Photos Wound Measurements Length: (cm) 0.3 Width: (cm) 0.5 Depth: (cm) 0.1 Area: (cm) 0.118 Volume: (cm) 0.012 % Reduction in Area: 83.3% % Reduction in Volume: 83.1% Epithelialization: Large (67-100%) Tunneling:  No Undermining: No Wound Description Classification: Full Thickness Without Exposed Support Structures Wound Margin: Distinct, outline attached Exudate Amount: Medium Exudate Type: Serosanguineous Exudate Color: red, brown Foul Odor After Cleansing: No Slough/Fibrino Yes Wound Bed Granulation Amount: Large (67-100%) Exposed Structure Granulation Quality: Pink Fascia Exposed: No Necrotic Amount: Small (1-33%) Fat Layer (Subcutaneous Tissue) Exposed: Yes Necrotic Quality: Adherent Slough Tendon Exposed: No Muscle Exposed: No Joint Exposed: No Bone Exposed: No Periwound Skin Texture Texture Color No Abnormalities Noted: Yes No Abnormalities Noted: No Atrophie Blanche: No Moisture Cyanosis: No No Abnormalities Noted: Yes Ecchymosis: No Erythema: No Hemosiderin Staining: No Mottled: No Pallor: No Rubor: No Temperature / Pain Temperature: No Abnormality Treatment Notes Wound #2 (Lower Leg) Wound Laterality: Right, Posterior Cleanser Soap and Water Discharge Instruction: May shower and wash wound with dial antibacterial soap and water prior to dressing change. Tiburcio PeaMOORE, Savilla V ([id number]) [id number] Page 8 of 10 Wound Cleanser Discharge Instruction: Cleanse the wound with wound cleanser prior to applying a clean dressing using gauze sponges, not tissue or cotton balls. Peri-Wound Care Ketoconazole Cream 2% Discharge Instruction: Apply Ketoconazole as directed Zinc Oxide Ointment 30g tube Discharge Instruction: Apply Zinc Oxide to periwound with each dressing change Sween Lotion (Moisturizing lotion) Discharge Instruction: Apply moisturizing lotion as directed Topical Primary Dressing Maxorb Extra Ag+ Alginate Dressing, 2x2 (in/in) Discharge Instruction: Apply to wound bed as instructed Secondary Dressing Zetuvit Plus 4x8 in Discharge Instruction: Apply over primary dressing as directed. Secured With Compression Wrap ThreePress (3 layer  compression wrap) Discharge Instruction: Apply three layer compression as directed. Netting,Tubular #5 Compression Stockings Add-Ons Electronic Signature(s) Signed: 06/13/2022 3:43:44 PM By: Samuella BruinHerrington, Taylor Entered By: Samuella BruinHerrington, Taylor on 06/13/2022 11:00:05 -------------------------------------------------------------------------------- Wound Assessment Details Patient Name: Date of Service: [name], [name] 06/13/2022 10:45 A M Medical Record Number: [id number] Patient Account Number: [id number] Date of Birth/Sex: Treating RN: 05/17/1945 (77 y.o. Fredderick PhenixF) Herrington, Taylor Primary Care Angellica Maddison: Willow OraPaz, Jose Other Clinician: Referring Wren Pryce: Treating Jhovani Griswold/Extender: Elsie Saasannon, Jennifer Paz, Jose Weeks in Treatment: 14 Wound Status Wound Number: 4 Primary Etiology: Lesion Wound Location: Right, Distal, Posterior Lower Leg Wound Status: Open Wounding Event: Gradually Appeared Comorbid History: Asthma, Hypertension, Received Radiation Date Acquired: 06/01/2022 Weeks Of Treatment: 1 Clustered Wound: No Photos Wound Measurements Length: (cm) 0.6 Width: (cm) 0.2 Depth: (cm) 0.1 Kenna GilbertMOORE, Aarion V ([id number]) Area: (cm) 0.094 Volume: (cm) 0.009 % Reduction in Area: 70.1% % Reduction in Volume: 85.7% Epithelialization: None [id number] Page 9 of 10 Tunneling: No Undermining:  No Wound Description Classification: Full Thickness Without Exposed Support Structures Wound Margin: Distinct, outline attached Exudate Amount: Medium Exudate Type: Serosanguineous Exudate Color: red, brown Foul Odor After Cleansing: No Slough/Fibrino Yes Wound Bed Granulation Amount: Small (1-33%) Exposed Structure Granulation Quality: Pink Fascia Exposed: No Necrotic Amount: Large (67-100%) Fat Layer (Subcutaneous Tissue) Exposed: Yes Necrotic Quality: Adherent Slough Tendon Exposed: No Muscle Exposed: No Joint Exposed: No Bone Exposed: No Periwound Skin Texture Texture  Color No Abnormalities Noted: Yes No Abnormalities Noted: Yes Moisture Temperature / Pain No Abnormalities Noted: Yes Temperature: No Abnormality Treatment Notes Wound #4 (Lower Leg) Wound Laterality: Right, Posterior, Distal Cleanser Soap and Water Discharge Instruction: May shower and wash wound with dial antibacterial soap and water prior to dressing change. Wound Cleanser Discharge Instruction: Cleanse the wound with wound cleanser prior to applying a clean dressing using gauze sponges, not tissue or cotton balls. Peri-Wound Care Ketoconazole Cream 2% Discharge Instruction: Apply Ketoconazole as directed Zinc Oxide Ointment 30g tube Discharge Instruction: Apply Zinc Oxide to periwound with each dressing change Sween Lotion (Moisturizing lotion) Discharge Instruction: Apply moisturizing lotion as directed Topical Primary Dressing Maxorb Extra Ag+ Alginate Dressing, 2x2 (in/in) Discharge Instruction: Apply to wound bed as instructed Secondary Dressing Zetuvit Plus 4x8 in Discharge Instruction: Apply over primary dressing as directed. Secured With Compression Wrap ThreePress (3 layer compression wrap) Discharge Instruction: Apply three layer compression as directed. Netting,Tubular #5 Compression Stockings Add-Ons Electronic Signature(s) Signed: 06/13/2022 3:43:44 PM By: Samuella Bruin Entered By: Samuella Bruin on 06/13/2022 11:01:01 Vitals Details -------------------------------------------------------------------------------- [name] ([id number]) [id number] Page 10 of 10 Patient Name: Date of Service: Misty ROCQUEL, ASKREN 06/13/2022 10:45 A M Medical Record Number: [id number] Patient Account Number: [id number] Date of Birth/Sex: Treating RN: 1945-06-17 (77 y.o. [name] Primary Care Neeta Storey: Willow Ora Other Clinician: Referring Rance Smithson: Treating Arend Bahl/Extender: Cresenciano Genre in Treatment: 14 Vital  Signs Time Taken: 10:49 Temperature (F): 98 Height (in): 60 Pulse (bpm): 66 Weight (lbs): 126 Respiratory Rate (breaths/min): 16 Body Mass Index (BMI): 24.6 Blood Pressure (mmHg): 155/72 Reference Range: 80 - 120 mg / dl Electronic Signature(s) Signed: 06/13/2022 3:43:44 PM By: Samuella Bruin Entered By: Samuella Bruin on 06/13/2022 10:50:18

## 2022-06-13 NOTE — Progress Notes (Signed)
[name] ([id number]) [id number] Page 1 of 12 Visit Report for 06/13/2022 Chief Complaint Document Details Patient Name: Date of Service: [name], [name] 06/13/2022 10:45 A M Medical Record Number: [id number] Patient Account Number: [id number] Date of Birth/Sex: Treating RN: December 08, 1945 (77 y.o. F) Primary Care Provider: Willow [name] Other Clinician: Referring Provider: Treating Provider/Extender: [name] in Treatment: 14 Information Obtained from: Patient Chief Complaint Patient seen for complaints of Non-Healing Wound. Electronic Signature(s) Signed: 06/13/2022 11:10:49 AM By: Duanne Guess MD FACS Entered By: Duanne Guess on 06/13/2022 11:10:48 -------------------------------------------------------------------------------- Debridement Details Patient Name: Date of Service: [name], [name] 06/13/2022 10:45 A M Medical Record Number: [id number] Patient Account Number: [id number] Date of Birth/Sex: Treating RN: Dec 24, 1945 (77 y.o. [name] Primary Care Provider: Willow [name] Other Clinician: Referring Provider: Treating Provider/Extender: [name] in Treatment: 14 Debridement Performed for Assessment: Wound #4 Right,Distal,Posterior Lower Leg Performed By: Physician Duanne Guess, MD Debridement Type: Debridement Level of Consciousness (Pre-procedure): Awake and Alert Pre-procedure Verification/Time Out Yes - 11:06 Taken: Start Time: 11:06 Pain Control: Lidocaine 4% T opical Solution T Area Debrided (L x W): otal 0.6 (cm) x 0.2 (cm) = 0.12 (cm) Tissue and other material debrided: Non-Viable, Slough, Slough Level: Non-Viable Tissue Debridement Description: Selective/Open Wound Instrument: Curette Bleeding: Minimum Hemostasis Achieved: Pressure Response to Treatment: Procedure was tolerated well Level of Consciousness (Post- Awake and Alert procedure): Post Debridement Measurements  of Total Wound Length: (cm) 0.6 Width: (cm) 0.2 Depth: (cm) 0.1 Volume: (cm) 0.009 Character of Wound/Ulcer Post Debridement: Improved Post Procedure Diagnosis Same as Pre-procedure Notes scribed for Dr. Lady Gary by Samuella Bruin, RN Electronic Signature(s) Signed: 06/13/2022 12:21:18 PM By: Duanne Guess MD FACS Signed: 06/13/2022 3:43:44 PM By: Samuella Bruin Entered By: Samuella Bruin on 06/13/2022 11:07:26 [name] ([id number]) [id number] Page 2 of 12 -------------------------------------------------------------------------------- Debridement Details Patient Name: Date of Service: [name], [name] 06/13/2022 10:45 A M Medical Record Number: [id number] Patient Account Number: [id number] Date of Birth/Sex: Treating RN: 02-17-1946 (77 y.o. [name] Primary Care Provider: Willow [name] Other Clinician: Referring Provider: Treating Provider/Extender: [name] in Treatment: 14 Debridement Performed for Assessment: Wound #2 Right,Posterior Lower Leg Performed By: Physician Duanne Guess, MD Debridement Type: Debridement Severity of Tissue Pre Debridement: Fat layer exposed Level of Consciousness (Pre-procedure): Awake and Alert Pre-procedure Verification/Time Out Yes - 11:06 Taken: Start Time: 11:06 Pain Control: Lidocaine 4% T opical Solution T Area Debrided (L x W): otal 0.3 (cm) x 0.5 (cm) = 0.15 (cm) Tissue and other material debrided: Non-Viable, Slough, Slough Level: Non-Viable Tissue Debridement Description: Selective/Open Wound Instrument: Curette Bleeding: Minimum Hemostasis Achieved: Pressure Response to Treatment: Procedure was tolerated well Level of Consciousness (Post- Awake and Alert procedure): Post Debridement Measurements of Total Wound Length: (cm) 0.3 Width: (cm) 0.5 Depth: (cm) 0.1 Volume: (cm) 0.012 Character of Wound/Ulcer Post Debridement: Improved Severity of Tissue  Post Debridement: Fat layer exposed Post Procedure Diagnosis Same as Pre-procedure Notes scribed for Dr. Lady Gary by Samuella Bruin, RN Electronic Signature(s) Signed: 06/13/2022 12:21:18 PM By: Duanne Guess MD FACS Signed: 06/13/2022 3:43:44 PM By: Samuella Bruin Entered By: Samuella Bruin on 06/13/2022 11:07:45 -------------------------------------------------------------------------------- Debridement Details Patient Name: Date of Service: [name], [name] 06/13/2022 10:45 A M Medical Record Number: [id number] Patient Account Number: [id number] Date of Birth/Sex: Treating RN: 12-14-1945 (77 y.o. [name] Primary Care Provider: Willow [name] Other Clinician: Referring Provider: Treating Provider/Extender: Duanne Guess  [name] Weeks in Treatment: 14 Debridement Performed for Assessment: Wound #1 Right Achilles Performed By: Physician Duanne Guess, MD Debridement Type: Debridement Level of Consciousness (Pre-procedure): Awake and Alert Pre-procedure Verification/Time Out Yes - 11:06 Taken: Start Time: 11:06 Pain Control: Lidocaine 4% T opical Solution T Area Debrided (L x W): otal 0.7 (cm) x 1.6 (cm) = 1.12 (cm) Tissue and other material debrided: Non-Viable, Slough, Slough Level: Non-Viable Tissue Debridement Description: Selective/Open Wound Instrument: Curette Bleeding: Minimum Hemostasis Achieved: Pressure Response to Treatment: Procedure was tolerated well [name], [name] ([id number]) [id number] Page 3 of 12 Level of Consciousness (Post- Awake and Alert procedure): Post Debridement Measurements of Total Wound Length: (cm) 0.7 Width: (cm) 1.6 Depth: (cm) 0.1 Volume: (cm) 0.088 Character of Wound/Ulcer Post Debridement: Improved Post Procedure Diagnosis Same as Pre-procedure Notes scribed for Dr. Lady Gary by Samuella Bruin, RN Electronic Signature(s) Signed: 06/13/2022 12:21:18 PM By: Duanne Guess MD  FACS Signed: 06/13/2022 3:43:44 PM By: Samuella Bruin Entered By: Samuella Bruin on 06/13/2022 11:08:06 -------------------------------------------------------------------------------- HPI Details Patient Name: Date of Service: [name], [name] 06/13/2022 10:45 A M Medical Record Number: [id number] Patient Account Number: [id number] Date of Birth/Sex: Treating RN: 11-23-1945 (77 y.o. F) Primary Care Provider: Willow [name] Other Clinician: Referring Provider: Treating Provider/Extender: [name] in Treatment: 14 History of Present Illness HPI Description: ADMISSION 03/01/2022 This is a 77 year old otherwise fairly healthy woman (not diabetic, not obese, does not smoke) who presents to the clinic with an ulcer on her right posterior leg. She has a history of a liposarcoma excised in 1973. The patient is not sure but believes she received radiation therapy to the location. She did have a skin graft. About 6 months ago, she developed a small ulcer in the area that seemed to start as an abscess. She has had an open wound in that area since then. A shave biopsy was taken by a dermatologist that showed inflammation without evidence of malignancy. She has been applying Vaseline to the site along with periwound TCA. ABI in clinic today is 0.94. Due to the failure of the wound to heal properly, she has been referred to the wound care center for further evaluation and management. 03/09/2022: The tissue on the wound is protruding further from the skin surface. It does not have a typical appearance consistent with hypertrophic granulation tissue. I am concerned that it may represent malignancy. 03/17/2022: Fortunately, the biopsy that I took last week was negative for malignancy. It returned as consistent with an ulcer and inflamed granulation tissue. She still has hypertrophic granulation tissue at the site and some slough accumulation on the surface. No significant change in the  wound dimensions. 03/23/2022: The wound measured smaller today and the hypertrophic granulation tissue is less prominent. Due to the unusual shape of her leg from old surgery, however, there has been some friction from her compressive wrap. 03/31/2022: The wound is slightly smaller today. There is light slough on the surface. There is still some hypertrophic granulation tissue present. 04/12/2022: She has a new wound near the top of her surgical defect. Where the skin rolls over, there has been some friction and abrasion that has resulted in tissue breakdown. There is slough buildup on the surface. The original wound is smaller and there is epithelialization around the borders. There is slough buildup on the surface again. She has been approved for TheraSkin but it is unclear what her financial responsibility would be at this point. We are still working on determining  that. 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 04/17/2022: Both posterior leg wounds are smaller today. They both have slough accumulation. She had her Mohs surgery and there is a defect on her anterior tibial surface. I spoke with her dermatologist and we are going to assume care for this wound. There is a layer of rubbery slough on this wound and it appears a little dry. 04/24/2022: All 3 wounds have a thick layer of rubbery slough on them. There is hypertrophic granulation tissue underneath this on both the anterior tibial surface wound and the posterior Achilles wound. She has a wound on her right forearm that was cultured by her dermatologist and grew out heavy Serratia marcescens and moderate Staph aureus. Apparently they are just having her apply topical mupirocin to the site because she has previously had a reaction to moxifloxacin, which is apparently the antibiotic they wanted to use. She asks if I have any other recommendations. 05/01/2022: All 3 wounds look better  this week. The hypertrophic granulation tissue has resolved on her anterior tibia and posterior Achilles wound. The wounds are all smaller. They have some slough accumulation. The moisture balance on the anterior tibial wound is good; the posterior wounds are a bit dry. 05/08/2022: The anterior tibial wound is smaller without any hypertrophic granulation tissue. There is a little bit of slough buildup. There is more slough on the posterior Achilles wound. The posterior wound under the flap of skin is a little bit macerated. 05/15/2022: The anterior tibial wound is healed. The posterior Achilles wound has heavy slough buildup. The posterior wound under the flap of skin continues to be macerated with slough accumulation. 05/29/2022: We are still dealing with a lot of moisture accumulation under the skin flap. There was an odor coming from the wound today when her dressing was removed. The Achilles wound has slough accumulation once again. AMAL, CLOWES ([id number]) [id number] Page 4 of 12 06/05/2022: Unfortunately, she seems to have had an allergic reaction to some component of her dressing, possibly the medium for the TCA. She has broken out on her entire lower leg. There has been more moisture accumulation under the flap of tissue on her posterior leg and a linear ulcer has opened just caudal to the existing ulcer in this site. There is slough on the Achilles wound, but there is also more epithelialization. 06/13/2022: All of the wounds look significantly better this week. We have much better moisture control on the posterior leg near the flap of tissue. There is some slough accumulation on all wound surfaces. Her skin looks less angry this week, as well. Electronic Signature(s) Signed: 06/13/2022 11:11:34 AM By: Duanne Guess MD FACS Entered By: Duanne Guess on 06/13/2022 11:11:34 -------------------------------------------------------------------------------- Physical Exam  Details Patient Name: Date of Service: [name], [name] 06/13/2022 10:45 A M Medical Record Number: [id number] Patient Account Number: [id number] Date of Birth/Sex: Treating RN: [birth year] (77 y.o. F) Primary Care Provider: Willow [name] Other Clinician: Referring Provider: Treating Provider/Extender: [name] in Treatment: 14 Constitutional Hypertensive, asymptomatic. . . . no acute distress. Respiratory Normal work of breathing on room air. Notes 06/13/2022: All of the wounds look significantly better this week. We have much better moisture control on the posterior leg near the flap of tissue. There is some slough accumulation on all wound surfaces. Her skin looks less angry this week, as well. Electronic Signature(s) Signed: 06/13/2022 11:12:25 AM By: Duanne Guess MD  FACS Entered By: Duanne Guess on 06/13/2022 11:12:25 -------------------------------------------------------------------------------- Physician Orders Details Patient Name: Date of Service: [name], [name] 06/13/2022 10:45 A M Medical Record Number: [id number] Patient Account Number: [id number] Date of Birth/Sex: Treating RN: 1946/01/07 (77 y.o. [name] Primary Care Provider: Willow [name] Other Clinician: Referring Provider: Treating Provider/Extender: [name] in Treatment: [id number] Verbal / Phone Orders: No Diagnosis Coding ICD-10 Coding Code Description [phone] Non-pressure chronic ulcer of other part of right lower leg with fat layer exposed L97.818 Non-pressure chronic ulcer of other part of right lower leg with other specified severity I10 Essential (primary) hypertension Z85.831 Personal history of malignant neoplasm of soft tissue Z92.3 Personal history of irradiation Follow-up Appointments ppointment in 1 week. - Dr. Lady Gary Room 2 Return A Anesthetic (In clinic) Topical Lidocaine 4% applied to wound bed Bathing/ Shower/ Hygiene May shower with  protection but do not get wound dressing(s) wet. Protect dressing(s) with water repellant cover (for example, large plastic bag) or a cast cover and may then take shower. - Please do not get leg wraps wet on Right Leg. May use a cast protector on right leg- can purchase from [employer]; Medical supply store; [facility] etc. the cast protector costs approximately $18-$29 Edema Control - Lymphedema / SCD / Other void standing for long periods of time. - Rest and elevate legs throughout the day. MICKALA, LATON ([id number]) [id number] Page 5 of 12 Moisturize legs daily. - Use a lotion or moisturizer for example :Sween,Eucerin;Aquaphor etc. Wound Treatment Wound #1 - Achilles Wound Laterality: Right Cleanser: Soap and Water 1 x Per Week/30 Days Discharge Instructions: May shower and wash wound with dial antibacterial soap and water prior to dressing change. Cleanser: Wound Cleanser 1 x Per Week/30 Days Discharge Instructions: Cleanse the wound with wound cleanser prior to applying a clean dressing using gauze sponges, not tissue or cotton balls. Peri-Wound Care: Ketoconazole Cream 2% 1 x Per Week/30 Days Discharge Instructions: Apply Ketoconazole as directed Peri-Wound Care: Zinc Oxide Ointment 30g tube 1 x Per Week/30 Days Discharge Instructions: Apply Zinc Oxide to periwound with each dressing change Peri-Wound Care: Sween Lotion (Moisturizing lotion) 1 x Per Week/30 Days Discharge Instructions: Apply moisturizing lotion as directed Prim Dressing: Maxorb Extra Ag+ Alginate Dressing, 2x2 (in/in) 1 x Per Week/30 Days ary Discharge Instructions: Apply to wound bed as instructed Secondary Dressing: Zetuvit Plus 4x8 in 1 x Per Week/30 Days Discharge Instructions: Apply over primary dressing as directed. Compression Wrap: ThreePress (3 layer compression wrap) 1 x Per Week/30 Days Discharge Instructions: Apply three layer compression as directed. Compression Wrap: Netting,Tubular #5  1 x Per Week/30 Days Wound #2 - Lower Leg Wound Laterality: Right, Posterior Cleanser: Soap and Water 1 x Per Week/30 Days Discharge Instructions: May shower and wash wound with dial antibacterial soap and water prior to dressing change. Cleanser: Wound Cleanser 1 x Per Week/30 Days Discharge Instructions: Cleanse the wound with wound cleanser prior to applying a clean dressing using gauze sponges, not tissue or cotton balls. Peri-Wound Care: Ketoconazole Cream 2% 1 x Per Week/30 Days Discharge Instructions: Apply Ketoconazole as directed Peri-Wound Care: Zinc Oxide Ointment 30g tube 1 x Per Week/30 Days Discharge Instructions: Apply Zinc Oxide to periwound with each dressing change Peri-Wound Care: Sween Lotion (Moisturizing lotion) 1 x Per Week/30 Days Discharge Instructions: Apply moisturizing lotion as directed Prim Dressing: Maxorb Extra Ag+ Alginate Dressing, 2x2 (in/in) 1 x Per Week/30 Days ary Discharge Instructions: Apply to wound bed as instructed  Secondary Dressing: Zetuvit Plus 4x8 in 1 x Per Week/30 Days Discharge Instructions: Apply over primary dressing as directed. Compression Wrap: ThreePress (3 layer compression wrap) 1 x Per Week/30 Days Discharge Instructions: Apply three layer compression as directed. Compression Wrap: Netting,Tubular #5 1 x Per Week/30 Days Wound #4 - Lower Leg Wound Laterality: Right, Posterior, Distal Cleanser: Soap and Water 1 x Per Week/30 Days Discharge Instructions: May shower and wash wound with dial antibacterial soap and water prior to dressing change. Cleanser: Wound Cleanser 1 x Per Week/30 Days Discharge Instructions: Cleanse the wound with wound cleanser prior to applying a clean dressing using gauze sponges, not tissue or cotton balls. Peri-Wound Care: Ketoconazole Cream 2% 1 x Per Week/30 Days Discharge Instructions: Apply Ketoconazole as directed Peri-Wound Care: Zinc Oxide Ointment 30g tube 1 x Per Week/30 Days Discharge  Instructions: Apply Zinc Oxide to periwound with each dressing change Peri-Wound Care: Sween Lotion (Moisturizing lotion) 1 x Per Week/30 Days Discharge Instructions: Apply moisturizing lotion as directed Prim Dressing: Maxorb Extra Ag+ Alginate Dressing, 2x2 (in/in) 1 x Per Week/30 Days ary Discharge Instructions: Apply to wound bed as instructed Secondary Dressing: Zetuvit Plus 4x8 in 1 x Per Week/30 Days MARTESHA, NIEDERMEIER ([id number]) [id number] Page 6 of 12 Discharge Instructions: Apply over primary dressing as directed. Compression Wrap: ThreePress (3 layer compression wrap) 1 x Per Week/30 Days Discharge Instructions: Apply three layer compression as directed. Compression Wrap: Netting,Tubular #5 1 x Per Week/30 Days Patient Medications llergies: bee venom protein (honey bee), moxifloxacin, promethazine HCl, balsam [place], thimerosal A Notifications Medication Indication Start End 06/13/2022 lidocaine DOSE topical 4 % cream - cream topical Electronic Signature(s) Signed: 06/13/2022 12:21:18 PM By: Duanne Guess MD FACS Entered By: Duanne Guess on 06/13/2022 11:12:41 -------------------------------------------------------------------------------- Problem List Details Patient Name: Date of Service: [name], [name] 06/13/2022 10:45 A M Medical Record Number: [id number] Patient Account Number: [id number] Date of Birth/Sex: Treating RN: 1945/08/02 (77 y.o. F) Primary Care Provider: Willow [name] Other Clinician: Referring Provider: Treating Provider/Extender: [name] in Treatment: 14 Active Problems ICD-10 Encounter Code Description Active Date MDM Diagnosis L97.812 Non-pressure chronic ulcer of other part of right lower leg with fat layer 03/01/2022 No Yes exposed L97.818 Non-pressure chronic ulcer of other part of right lower leg with other specified 04/17/2022 No Yes severity I10 Essential (primary) hypertension 03/01/2022 No  Yes Z85.831 Personal history of malignant neoplasm of soft tissue 03/01/2022 No Yes Z92.3 Personal history of irradiation 03/01/2022 No Yes Inactive Problems Resolved Problems Electronic Signature(s) Signed: 06/13/2022 11:09:54 AM By: Duanne Guess MD FACS Entered By: Duanne Guess on 06/13/2022 11:09:54 [name] ([id number]) [id number] Page 7 of 12 -------------------------------------------------------------------------------- Progress Note Details Patient Name: Date of Service: [name], [name] 06/13/2022 10:45 A M Medical Record Number: [id number] Patient Account Number: [id number] Date of Birth/Sex: Treating RN: 1945/09/08 (77 y.o. F) Primary Care Provider: Willow [name] Other Clinician: Referring Provider: Treating Provider/Extender: [name] in Treatment: 14 Subjective Chief Complaint Information obtained from Patient Patient seen for complaints of Non-Healing Wound. History of Present Illness (HPI) ADMISSION 03/01/2022 This is a 77 year old otherwise fairly healthy woman (not diabetic, not obese, does not smoke) who presents to the clinic with an ulcer on her right posterior leg. She has a history of a liposarcoma excised in 1973. The patient is not sure but believes she received radiation therapy to the location. She did have a skin graft. About 6 months ago, she developed a small  ulcer in the area that seemed to start as an abscess. She has had an open wound in that area since then. A shave biopsy was taken by a dermatologist that showed inflammation without evidence of malignancy. She has been applying Vaseline to the site along with periwound TCA. ABI in clinic today is 0.94. Due to the failure of the wound to heal properly, she has been referred to the wound care center for further evaluation and management. 03/09/2022: The tissue on the wound is protruding further from the skin surface. It does not have a typical  appearance consistent with hypertrophic granulation tissue. I am concerned that it may represent malignancy. 03/17/2022: Fortunately, the biopsy that I took last week was negative for malignancy. It returned as consistent with an ulcer and inflamed granulation tissue. She still has hypertrophic granulation tissue at the site and some slough accumulation on the surface. No significant change in the wound dimensions. 03/23/2022: The wound measured smaller today and the hypertrophic granulation tissue is less prominent. Due to the unusual shape of her leg from old surgery, however, there has been some friction from her compressive wrap. 03/31/2022: The wound is slightly smaller today. There is light slough on the surface. There is still some hypertrophic granulation tissue present. 04/12/2022: She has a new wound near the top of her surgical defect. Where the skin rolls over, there has been some friction and abrasion that has resulted in tissue breakdown. There is slough buildup on the surface. The original wound is smaller and there is epithelialization around the borders. There is slough buildup on the surface again. She has been approved for TheraSkin but it is unclear what her financial responsibility would be at this point. We are still working on determining that. 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 04/17/2022: Both posterior leg wounds are smaller today. They both have slough accumulation. She had her Mohs surgery and there is a defect on her anterior tibial surface. I spoke with her dermatologist and we are going to assume care for this wound. There is a layer of rubbery slough on this wound and it appears a little dry. 04/24/2022: All 3 wounds have a thick layer of rubbery slough on them. There is hypertrophic granulation tissue underneath this on both the anterior tibial surface wound and the posterior Achilles wound. She has a  wound on her right forearm that was cultured by her dermatologist and grew out heavy Serratia marcescens and moderate Staph aureus. Apparently they are just having her apply topical mupirocin to the site because she has previously had a reaction to moxifloxacin, which is apparently the antibiotic they wanted to use. She asks if I have any other recommendations. 05/01/2022: All 3 wounds look better this week. The hypertrophic granulation tissue has resolved on her anterior tibia and posterior Achilles wound. The wounds are all smaller. They have some slough accumulation. The moisture balance on the anterior tibial wound is good; the posterior wounds are a bit dry. 05/08/2022: The anterior tibial wound is smaller without any hypertrophic granulation tissue. There is a little bit of slough buildup. There is more slough on the posterior Achilles wound. The posterior wound under the flap of skin is a little bit macerated. 05/15/2022: The anterior tibial wound is healed. The posterior Achilles wound has heavy slough buildup. The posterior wound under the flap of skin continues to be macerated with slough accumulation. 05/29/2022: We are still dealing with a  lot of moisture accumulation under the skin flap. There was an odor coming from the wound today when her dressing was removed. The Achilles wound has slough accumulation once again. 06/05/2022: Unfortunately, she seems to have had an allergic reaction to some component of her dressing, possibly the medium for the TCA. She has broken out on her entire lower leg. There has been more moisture accumulation under the flap of tissue on her posterior leg and a linear ulcer has opened just caudal to the existing ulcer in this site. There is slough on the Achilles wound, but there is also more epithelialization. 06/13/2022: All of the wounds look significantly better this week. We have much better moisture control on the posterior leg near the flap of tissue. There is  some slough accumulation on all wound surfaces. Her skin looks less angry this week, as well. Patient History Information obtained from Patient. Family History Unknown History. Social History Never smoker, Marital Status - Married, Alcohol Use - Never, Drug Use - No History, Caffeine Use - Never. Medical History Respiratory Patient has history of Asthma - Childhood asthma Cardiovascular Patient has history of Hypertension Oncologic Patient has history of Received Radiation - R/T Liposarcoma in 1972 Hospitalization/Surgery History - Left Breast Lumpectomy;Tonsillectomy ; Squamous cell carcinoma; Hysterectomy;Right Leg liposarcoma. 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 no acute distress. Vitals Time Taken: 10:49 AM, Height: 60 in, Weight: 126 lbs, BMI: 24.6, Temperature: 98 F, Pulse: 66 bpm, Respiratory Rate: 16 breaths/min, Blood Pressure: 155/72 mmHg. Respiratory Normal work of breathing on room air. General Notes: 06/13/2022: All of the wounds look significantly better this week. We have much better moisture control on the posterior leg near the flap of tissue. There is some slough accumulation on all wound surfaces. Her skin looks less angry this week, as well. Integumentary (Hair, Skin) Wound #1 status is Open. Original cause of wound was Gradually Appeared. The date acquired was:  08/30/2021. The wound has been in treatment 14 weeks. The wound is located on the Right Achilles. The wound measures 0.7cm length x 1.6cm width x 0.1cm depth; 0.88cm^2 area and 0.088cm^3 volume. There is Fat Layer (Subcutaneous Tissue) exposed. There is no tunneling or undermining noted. There is a medium amount of serosanguineous drainage noted. The wound margin is distinct with the outline attached to the wound base. There is large (67-100%) red, hyper - granulation within the wound bed. There is a small (1- 33%) amount of necrotic tissue within the wound bed including Adherent Slough. The periwound skin appearance had no abnormalities noted for texture. The periwound skin appearance had no abnormalities noted for moisture. The periwound skin appearance exhibited: Rubor. The periwound skin appearance did not exhibit: Atrophie Blanche, Cyanosis, Ecchymosis, Hemosiderin Staining, Mottled, Pallor, Erythema. Periwound temperature was noted as No Abnormality. Wound #2 status is Open. Original cause of wound was Shear/Friction. The date acquired was: 04/08/2022. The wound has been in treatment 8 weeks. The wound is located on the Right,Posterior Lower Leg. The wound measures 0.3cm length x 0.5cm width x 0.1cm depth; 0.118cm^2 area and 0.012cm^3 volume. There is  Fat Layer (Subcutaneous Tissue) exposed. There is no tunneling or undermining noted. There is a medium amount of serosanguineous drainage noted. The wound margin is distinct with the outline attached to the wound base. There is large (67-100%) pink granulation within the wound bed. There is a small (1-33%) amount of necrotic tissue within the wound bed including Adherent Slough. The periwound skin appearance had no abnormalities noted for texture. The periwound skin appearance had no abnormalities noted for moisture. The periwound skin appearance did not exhibit: Atrophie Blanche, Cyanosis, Ecchymosis, Hemosiderin Staining, Mottled, Pallor, Rubor,  Erythema. Periwound temperature was noted as No Abnormality. Wound #4 status is Open. Original cause of wound was Gradually Appeared. The date acquired was: 06/01/2022. The wound has been in treatment 1 weeks. The wound is located on the Right,Distal,Posterior Lower Leg. The wound measures 0.6cm length x 0.2cm width x 0.1cm depth; 0.094cm^2 area and 0.009cm^3 volume. There is Fat Layer (Subcutaneous Tissue) exposed. There is no tunneling or undermining noted. There is a medium amount of serosanguineous drainage noted. The wound margin is distinct with the outline attached to the wound base. There is small (1-33%) pink granulation within the wound bed. There is a large (67-100%) amount of necrotic tissue within the wound bed including Adherent Slough. The periwound skin appearance had no abnormalities noted for texture. The periwound skin appearance had no abnormalities noted for moisture. The periwound skin appearance had no abnormalities noted for color. Periwound temperature was noted as No Abnormality. Assessment Active Problems ICD-10 Non-pressure chronic ulcer of other part of right lower leg with fat layer exposed Non-pressure chronic ulcer of other part of right lower leg with other specified severity Essential (primary) hypertension Personal history of malignant neoplasm of soft tissue Personal history of irradiation Procedures [name], [name] ([id number]) [id number] Page 9 of 12 Wound #1 Pre-procedure diagnosis of Wound #1 is an Abscess located on the Right Achilles . There was a Selective/Open Wound Non-Viable Tissue Debridement with a total area of 1.12 sq cm performed by Duanne Guess, MD. With the following instrument(s): Curette to remove Non-Viable tissue/material. Material removed includes [facility] after achieving pain control using Lidocaine 4% Topical Solution. No specimens were taken. A time out was conducted at 11:06, prior to the start of the  procedure. A Minimum amount of bleeding was controlled with Pressure. The procedure was tolerated well. Post Debridement Measurements: 0.7cm length x 1.6cm width x 0.1cm depth; 0.088cm^3 volume. Character of Wound/Ulcer Post Debridement is improved. Post procedure Diagnosis Wound #1: Same as Pre-Procedure General Notes: scribed for Dr. Lady Gary by Samuella Bruin, RN. Pre-procedure diagnosis of Wound #1 is an Abscess located on the Right Achilles . There was a Three Layer Compression Therapy Procedure by Samuella Bruin, RN. Post procedure Diagnosis Wound #1: Same as Pre-Procedure Wound #2 Pre-procedure diagnosis of Wound #2 is a Venous Leg Ulcer located on the Right,Posterior Lower Leg .Severity of Tissue Pre Debridement is: Fat layer exposed. There was a Selective/Open Wound Non-Viable Tissue Debridement with a total area of 0.15 sq cm performed by Duanne Guess, MD. With the following instrument(s): Curette to remove Non-Viable tissue/material. Material removed includes [facility] after achieving pain control using Lidocaine 4% Topical Solution. No specimens were taken. A time out was conducted at 11:06, prior to the start of the procedure. A Minimum amount of bleeding was controlled with Pressure. The procedure was tolerated well. Post Debridement Measurements: 0.3cm length x 0.5cm width x 0.1cm depth; 0.012cm^3 volume. Character of Wound/Ulcer Post Debridement is improved. Severity of Tissue  Post Debridement is: Fat layer exposed. Post procedure Diagnosis Wound #2: Same as Pre-Procedure General Notes: scribed for Dr. Lady Gary by Samuella Bruin, RN. Wound #4 Pre-procedure diagnosis of Wound #4 is a Lesion located on the Right,Distal,Posterior Lower Leg . There was a Selective/Open Wound Non-Viable Tissue Debridement with a total area of 0.12 sq cm performed by Duanne Guess, MD. With the following instrument(s): Curette to remove Non-Viable tissue/material. Material removed includes  [facility] after achieving pain control using Lidocaine 4% Topical Solution. No specimens were taken. A time out was conducted at 11:06, prior to the start of the procedure. A Minimum amount of bleeding was controlled with Pressure. The procedure was tolerated well. Post Debridement Measurements: 0.6cm length x 0.2cm width x 0.1cm depth; 0.009cm^3 volume. Character of Wound/Ulcer Post Debridement is improved. Post procedure Diagnosis Wound #4: Same as Pre-Procedure General Notes: scribed for Dr. Lady Gary by Samuella Bruin, RN. Plan Follow-up Appointments: Return Appointment in 1 week. - Dr. Lady Gary Room 2 Anesthetic: (In clinic) Topical Lidocaine 4% applied to wound bed Bathing/ Shower/ Hygiene: May shower with protection but do not get wound dressing(s) wet. Protect dressing(s) with water repellant cover (for example, large plastic bag) or a cast cover and may then take shower. - Please do not get leg wraps wet on Right Leg. May use a cast protector on right leg- can purchase from [employer]; Medical supply store; [facility] etc. the cast protector costs approximately $18-$29 Edema Control - Lymphedema / SCD / Other: Avoid standing for long periods of time. - Rest and elevate legs throughout the day. Moisturize legs daily. - Use a lotion or moisturizer for example :Sween,Eucerin;Aquaphor etc. The following medication(s) was prescribed: lidocaine topical 4 % cream cream topical was prescribed at facility WOUND #1: - Achilles Wound Laterality: Right Cleanser: Soap and Water 1 x Per Week/30 Days Discharge Instructions: May shower and wash wound with dial antibacterial soap and water prior to dressing change. Cleanser: Wound Cleanser 1 x Per Week/30 Days Discharge Instructions: Cleanse the wound with wound cleanser prior to applying a clean dressing using gauze sponges, not tissue or cotton balls. Peri-Wound Care: Ketoconazole Cream 2% 1 x Per Week/30 Days Discharge Instructions: Apply Ketoconazole as  directed Peri-Wound Care: Zinc Oxide Ointment 30g tube 1 x Per Week/30 Days Discharge Instructions: Apply Zinc Oxide to periwound with each dressing change Peri-Wound Care: Sween Lotion (Moisturizing lotion) 1 x Per Week/30 Days Discharge Instructions: Apply moisturizing lotion as directed Prim Dressing: Maxorb Extra Ag+ Alginate Dressing, 2x2 (in/in) 1 x Per Week/30 Days ary Discharge Instructions: Apply to wound bed as instructed Secondary Dressing: Zetuvit Plus 4x8 in 1 x Per Week/30 Days Discharge Instructions: Apply over primary dressing as directed. Com pression Wrap: ThreePress (3 layer compression wrap) 1 x Per Week/30 Days Discharge Instructions: Apply three layer compression as directed. Com pression Wrap: Netting,Tubular #5 1 x Per Week/30 Days WOUND #2: - Lower Leg Wound Laterality: Right, Posterior Cleanser: Soap and Water 1 x Per Week/30 Days Discharge Instructions: May shower and wash wound with dial antibacterial soap and water prior to dressing change. Cleanser: Wound Cleanser 1 x Per Week/30 Days Discharge Instructions: Cleanse the wound with wound cleanser prior to applying a clean dressing using gauze sponges, not tissue or cotton balls. Peri-Wound Care: Ketoconazole Cream 2% 1 x Per Week/30 Days Discharge Instructions: Apply Ketoconazole as directed Peri-Wound Care: Zinc Oxide Ointment 30g tube 1 x Per Week/30 Days Discharge Instructions: Apply Zinc Oxide to periwound with each dressing change Peri-Wound Care: Donnal Moat (  Moisturizing lotion) 1 x Per Week/30 Days Discharge Instructions: Apply moisturizing lotion as directed Prim Dressing: Maxorb Extra Ag+ Alginate Dressing, 2x2 (in/in) 1 x Per Week/30 Days ary Discharge Instructions: Apply to wound bed as instructed Secondary Dressing: Zetuvit Plus 4x8 in 1 x Per Week/30 Days Discharge Instructions: Apply over primary dressing as directed. Com pression Wrap: ThreePress (3 layer compression wrap) 1 x Per Week/30  Days Discharge Instructions: Apply three layer compression as directed. Com pression Wrap: Netting,Tubular #5 1 x Per Week/30 Days VANDY, FONG ([id number]) [id number] Page 10 of 12 WOUND #4: - Lower Leg Wound Laterality: Right, Posterior, Distal Cleanser: Soap and Water 1 x Per Week/30 Days Discharge Instructions: May shower and wash wound with dial antibacterial soap and water prior to dressing change. Cleanser: Wound Cleanser 1 x Per Week/30 Days Discharge Instructions: Cleanse the wound with wound cleanser prior to applying a clean dressing using gauze sponges, not tissue or cotton balls. Peri-Wound Care: Ketoconazole Cream 2% 1 x Per Week/30 Days Discharge Instructions: Apply Ketoconazole as directed Peri-Wound Care: Zinc Oxide Ointment 30g tube 1 x Per Week/30 Days Discharge Instructions: Apply Zinc Oxide to periwound with each dressing change Peri-Wound Care: Sween Lotion (Moisturizing lotion) 1 x Per Week/30 Days Discharge Instructions: Apply moisturizing lotion as directed Prim Dressing: Maxorb Extra Ag+ Alginate Dressing, 2x2 (in/in) 1 x Per Week/30 Days ary Discharge Instructions: Apply to wound bed as instructed Secondary Dressing: Zetuvit Plus 4x8 in 1 x Per Week/30 Days Discharge Instructions: Apply over primary dressing as directed. Com pression Wrap: ThreePress (3 layer compression wrap) 1 x Per Week/30 Days Discharge Instructions: Apply three layer compression as directed. Com pression Wrap: Netting,Tubular #5 1 x Per Week/30 Days 06/13/2022: All of the wounds look significantly better this week. We have much better moisture control on the posterior leg near the flap of tissue. There is some slough accumulation on all wound surfaces. Her skin looks less angry this week, as well. I used a curette to debride slough from each of the wound surfaces. We will continue with silver alginate. We will continue to apply zinc oxide mixed with ketoconazole to  the periwound and continue 3 layer compression. Follow-up in 1 week. Electronic Signature(s) Signed: 06/13/2022 11:16:26 AM By: Duanne Guess MD FACS Entered By: Duanne Guess on 06/13/2022 11:16:25 -------------------------------------------------------------------------------- HxROS Details Patient Name: Date of Service: [name], [name] 06/13/2022 10:45 A M Medical Record Number: [id number] Patient Account Number: [id number] Date of Birth/Sex: Treating RN: 1945-09-24 (77 y.o. 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 Respiratory Medical History: Positive for: Asthma - Childhood asthma Cardiovascular Medical History: Positive for: Hypertension Past Medical History Notes: Hx: Angio-edema r/t Bee venom; Hyperlipidemia Endocrine Medical History: Past Medical History Notes: Hx: hypothyroidism Immunological Medical History: Past Medical History Notes: Hx: Currently being treated with Immunotherapy (r/t Bee-Venom) Integumentary (Skin) SHEILYN, BOEHLKE V ([id number]) [id number] Page 11 of 12 Medical History: Past Medical History Notes: Hx: Urticaria Musculoskeletal Medical History: Past Medical History Notes: Hx: Osteoporosis Oncologic Medical History: Positive for: Received Radiation - R/T Liposarcoma in 1972 Past Medical History Notes: 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)-Biopsy taken to rule out squamous cell carcinoma on right arm and right leg (02/24/22), still pending results Immunizations Pneumococcal Vaccine: Received Pneumococcal Vaccination: Yes Received Pneumococcal Vaccination On or After 60th Birthday: Yes  Implantable Devices No devices added Hospitalization / Surgery  History Type of Hospitalization/Surgery Left Breast Lumpectomy;Tonsillectomy ; Squamous cell carcinoma; Hysterectomy;Right Leg liposarcoma Family and Social History Unknown History: Yes; Never smoker; Marital Status - Married; Alcohol Use: Never; Drug Use: No History; Caffeine Use: Never; Financial Concerns: No; Food, Clothing or Shelter Needs: No; Support System Lacking: No; Transportation Concerns: No Electronic Signature(s) Signed: 06/13/2022 12:21:18 PM By: Duanne Guessannon, Jariyah Hackley MD FACS Entered By: Duanne Guessannon, Nicie Milan on 06/13/2022 11:11:40 -------------------------------------------------------------------------------- SuperBill Details Patient Name: Date of Service: [name], [name] 06/13/2022 Medical Record Number: [id number] Patient Account Number: [id number] Date of Birth/Sex: Treating RN: 1945-11-23 (77 y.o. F) Primary Care Provider: Willow OraPaz, Jose Other Clinician: Referring Provider: Treating Provider/Extender: Elsie Saasannon, Ayaat Jansma Paz, Jose Weeks in Treatment: 14 Diagnosis Coding ICD-10 Codes Code Description [id number] Non-pressure chronic ulcer of other part of right lower leg with fat layer exposed L97.818 Non-pressure chronic ulcer of other part of right lower leg with other specified severity I10 Essential (primary) hypertension Z85.831 Personal history of malignant neoplasm of soft tissue Z92.3 Personal history of irradiation Facility Procedures : CPT4 Code: [id number] Description: [id number] - DEBRIDE WOUND 1ST 20 SQ CM OR < ICD-10 Diagnosis Description L97.812 Non-pressure chronic ulcer of other part of right lower leg with fat layer exposed L97.818 Non-pressure chronic ulcer of other part of right lower leg with other  specified s Modifier: everity Quantity: 1 Physician Procedures : CPT4 Code Description Modifier [id number] - WC PHYS LEVEL 3 - EST PT 25 Kenna GilbertMOORE, Earlisha V ([id number]) [id number] P ICD-10 Diagnosis Description L97.812 Non-pressure chronic ulcer of  other part of right lower leg with fat  layer exposed L97.818 Non-pressure chronic ulcer of other part of right lower leg with other specified severity Z85.831 Personal history of malignant neoplasm of soft tissue Z92.3 Personal history of irradiation Quantity: 1 age 77 of 7512 : [id number] - WC PHYS DEBR WO ANESTH 20 SQ CM 1 ICD-10 Diagnosis Description L97.812 Non-pressure chronic ulcer of other part of right lower leg with fat layer exposed L97.818 Non-pressure chronic ulcer of other part of right lower leg with other  specified severity Quantity: Electronic Signature(s) Signed: 06/13/2022 11:16:59 AM By: Duanne Guessannon, Andilynn Delavega MD FACS Entered By: Duanne Guessannon, Josepha Barbier on 06/13/2022 11:16:58

## 2022-06-14 ENCOUNTER — Ambulatory Visit: Payer: Medicare HMO

## 2022-06-16 ENCOUNTER — Ambulatory Visit (INDEPENDENT_AMBULATORY_CARE_PROVIDER_SITE_OTHER): Payer: Medicare HMO | Admitting: *Deleted

## 2022-06-16 VITALS — BP 146/73 | HR 66 | Ht 60.0 in | Wt 128.0 lb

## 2022-06-16 DIAGNOSIS — Z0000 Encounter for general adult medical examination without abnormal findings: Secondary | ICD-10-CM

## 2022-06-16 NOTE — Patient Instructions (Signed)
[name] , Thank you for taking time to come for your Medicare Wellness Visit. I appreciate your ongoing commitment to your health goals. Please review the following plan we discussed and let me know if I can assist you in the future.     This is a list of the screening recommended for you and due dates:  Health Maintenance  Topic Date Due   COVID-19 Vaccine (7 - 2023-24 season) 09/21/2022*   Flu Shot  10/05/2022   Mammogram  01/19/2023   Medicare Annual Wellness Visit  06/16/2023   DTaP/Tdap/Td vaccine (3 - Td or Tdap) 04/12/2031   Pneumonia Vaccine  Completed   DEXA scan (bone density measurement)  Completed   Hepatitis C Screening: USPSTF Recommendation to screen - Ages 34-79 yo.  Completed   Zoster (Shingles) Vaccine  Completed   HPV Vaccine  Aged Out   Colon Cancer Screening  Discontinued  *Topic was postponed. The date shown is not the original due date.     Next appointment: Follow up in one year for your annual wellness visit.   Preventive Care 21 Years and Older, Female Preventive care refers to lifestyle choices and visits with your health care provider that can promote health and wellness. What does preventive care include? A yearly physical exam. This is also called an annual well check. Dental exams once or twice a year. Routine eye exams. Ask your health care provider how often you should have your eyes checked. Personal lifestyle choices, including: Daily care of your teeth and gums. Regular physical activity. Eating a healthy diet. Avoiding tobacco and drug use. Limiting alcohol use. Practicing safe sex. Taking low-dose aspirin every day. Taking vitamin and mineral supplements as recommended by your health care provider. What happens during an annual well check? The services and screenings done by your health care provider during your annual well check will depend on your age, overall health, lifestyle risk factors, and family history of disease. Counseling   Your health care provider may ask you questions about your: Alcohol use. Tobacco use. Drug use. Emotional well-being. Home and relationship well-being. Sexual activity. Eating habits. History of falls. Memory and ability to understand (cognition). Work and work Astronomer. Reproductive health. Screening  You may have the following tests or measurements: Height, weight, and BMI. Blood pressure. Lipid and cholesterol levels. These may be checked every 5 years, or more frequently if you are over 48 years old. Skin check. Lung cancer screening. You may have this screening every year starting at age 50 if you have a 30-pack-year history of smoking and currently smoke or have quit within the past 15 years. Fecal occult blood test (FOBT) of the stool. You may have this test every year starting at age 52. Flexible sigmoidoscopy or colonoscopy. You may have a sigmoidoscopy every 5 years or a colonoscopy every 10 years starting at age 46. Hepatitis C blood test. Hepatitis B blood test. Sexually transmitted disease (STD) testing. Diabetes screening. This is done by checking your blood sugar (glucose) after you have not eaten for a while (fasting). You may have this done every 1-3 years. Bone density scan. This is done to screen for osteoporosis. You may have this done starting at age 61. Mammogram. This may be done every 1-2 years. Talk to your health care provider about how often you should have regular mammograms. Talk with your health care provider about your test results, treatment options, and if necessary, the need for more tests. Vaccines  Your health care  provider may recommend certain vaccines, such as: Influenza vaccine. This is recommended every year. Tetanus, diphtheria, and acellular pertussis (Tdap, Td) vaccine. You may need a Td booster every 10 years. Zoster vaccine. You may need this after age 45. Pneumococcal 13-valent conjugate (PCV13) vaccine. One dose is recommended  after age 5. Pneumococcal polysaccharide (PPSV23) vaccine. One dose is recommended after age 3. Talk to your health care provider about which screenings and vaccines you need and how often you need them. This information is not intended to replace advice given to you by your health care provider. Make sure you discuss any questions you have with your health care provider. Document Released: 03/19/2015 Document Revised: 11/10/2015 Document Reviewed: 12/22/2014 Elsevier Interactive Patient Education  2017 [place] Prevention in the Home Falls can cause injuries. They can happen to people of all ages. There are many things you can do to make your home safe and to help prevent falls. What can I do on the outside of my home? Regularly fix the edges of walkways and driveways and fix any cracks. Remove anything that might make you trip as you walk through a door, such as a raised step or threshold. Trim any bushes or trees on the path to your home. Use bright outdoor lighting. Clear any walking paths of anything that might make someone trip, such as rocks or tools. Regularly check to see if handrails are loose or broken. Make sure that both sides of any steps have handrails. Any raised decks and porches should have guardrails on the edges. Have any leaves, snow, or ice cleared regularly. Use sand or salt on walking paths during winter. Clean up any spills in your garage right away. This includes oil or grease spills. What can I do in the bathroom? Use night lights. Install grab bars by the toilet and in the tub and shower. Do not use towel bars as grab bars. Use non-skid mats or decals in the tub or shower. If you need to sit down in the shower, use a plastic, non-slip stool. Keep the floor dry. Clean up any water that spills on the floor as soon as it happens. Remove soap buildup in the tub or shower regularly. Attach bath mats securely with double-sided non-slip rug tape. Do not  have throw rugs and other things on the floor that can make you trip. What can I do in the bedroom? Use night lights. Make sure that you have a light by your bed that is easy to reach. Do not use any sheets or blankets that are too big for your bed. They should not hang down onto the floor. Have a firm chair that has side arms. You can use this for support while you get dressed. Do not have throw rugs and other things on the floor that can make you trip. What can I do in the kitchen? Clean up any spills right away. Avoid walking on wet floors. Keep items that you use a lot in easy-to-reach places. If you need to reach something above you, use a strong step stool that has a grab bar. Keep electrical cords out of the way. Do not use floor polish or wax that makes floors slippery. If you must use wax, use non-skid floor wax. Do not have throw rugs and other things on the floor that can make you trip. What can I do with my stairs? Do not leave any items on the stairs. Make sure that there are handrails on both  sides of the stairs and use them. Fix handrails that are broken or loose. Make sure that handrails are as long as the stairways. Check any carpeting to make sure that it is firmly attached to the stairs. Fix any carpet that is loose or worn. Avoid having throw rugs at the top or bottom of the stairs. If you do have throw rugs, attach them to the floor with carpet tape. Make sure that you have a light switch at the top of the stairs and the bottom of the stairs. If you do not have them, ask someone to add them for you. What else can I do to help prevent falls? Wear shoes that: Do not have high heels. Have rubber bottoms. Are comfortable and fit you well. Are closed at the toe. Do not wear sandals. If you use a stepladder: Make sure that it is fully opened. Do not climb a closed stepladder. Make sure that both sides of the stepladder are locked into place. Ask someone to hold it for  you, if possible. Clearly mark and make sure that you can see: Any grab bars or handrails. First and last steps. Where the edge of each step is. Use tools that help you move around (mobility aids) if they are needed. These include: Canes. Walkers. Scooters. Crutches. Turn on the lights when you go into a dark area. Replace any light bulbs as soon as they burn out. Set up your furniture so you have a clear path. Avoid moving your furniture around. If any of your floors are uneven, fix them. If there are any pets around you, be aware of where they are. Review your medicines with your doctor. Some medicines can make you feel dizzy. This can increase your chance of falling. Ask your doctor what other things that you can do to help prevent falls. This information is not intended to replace advice given to you by your health care provider. Make sure you discuss any questions you have with your health care provider. Document Released: 12/17/2008 Document Revised: 07/29/2015 Document Reviewed: 03/27/2014 Elsevier Interactive Patient Education  2017 [employer].

## 2022-06-16 NOTE — Progress Notes (Signed)
Subjective:  Pt completed ADLs, Fall risk, and SDOH during e-check in on 06/14/22. Answers verified with pt.    [name] is a 77 y.o. female who presents for Medicare Annual (Subsequent) preventive examination.  I connected with  [name] on 06/16/22 by a audio enabled telemedicine application and verified that I am speaking with the correct person using two identifiers.  Patient Location: Home  Provider Location: Office/Clinic  I discussed the limitations of evaluation and management by telemedicine. The patient expressed understanding and agreed to proceed.   Review of Systems     Cardiac Risk Factors include: advanced age (>79men, >72 women);dyslipidemia;hypertension     Objective:    Today's Vitals   06/16/22 0900 06/16/22 0919  BP: (!) 187/168 (!) 146/73  Pulse:  66  Weight: 128 lb (58.1 kg)   Height: 5' (1.524 m)    Body mass index is 25 kg/m.     06/16/2022    9:06 AM 06/09/2021    3:45 PM 06/03/2020    2:05 PM 12/10/2019    3:09 PM 04/14/2019    9:08 AM 04/08/2018   10:16 AM 04/06/2017    2:25 PM  Advanced Directives  Does Patient Have a Medical Advance Directive? Yes Yes Yes Yes Yes Yes Yes  Type of Estate agent of [place];Living will Healthcare Power of [place];Out of facility DNR (pink MOST or yellow form);Living will Healthcare Power of [place];Living will Healthcare Power of [place];Living will Healthcare Power of [place];Living will Healthcare Power of [place];Living will Living will  Does patient want to make changes to medical advance directive? No - Patient declined    No - Patient declined No - Patient declined   Copy of Healthcare Power of Attorney in Chart? Yes - validated most recent copy scanned in chart (See row information) Yes - validated most recent copy scanned in chart (See row information) No - copy requested  No - copy requested No - copy requested No - copy requested    Current Medications (verified) Outpatient  Encounter Medications as of 06/16/2022  Medication Sig   amLODipine (NORVASC) 10 MG tablet Take 1 tablet (10 mg total) by mouth daily.   atorvastatin (LIPITOR) 20 MG tablet TAKE 1 TABLET BY MOUTH DAILY   calcium citrate-vitamin D (CITRACAL+D) 315-200 MG-UNIT per tablet Take 1 tablet by mouth. 600 mg bid   cholecalciferol (VITAMIN D) 1000 units tablet Take 1,000 Units by mouth daily.   denosumab (PROLIA) 60 MG/ML SOSY injection Pt will get at [facility] on 04/11/22   EPINEPHrine 0.3 mg/0.3 mL IJ SOAJ injection Inject 0.3 mg into the muscle as needed for anaphylaxis. (Patient not taking: Reported on 03/24/2022)   latanoprost (XALATAN) 0.005 % ophthalmic solution Place 1 drop into both eyes at bedtime.   levothyroxine (SYNTHROID) 50 MCG tablet TAKE 1 TABLET BY MOUTH DAILY BEFORE BREAKFAST   metoprolol succinate (TOPROL-XL) 25 MG 24 hr tablet Take 1 tablet (25 mg total) by mouth daily. Take with or immediately following a meal   Multiple Vitamin (MULTIVITAMIN) tablet Take 1 tablet by mouth daily.   No facility-administered encounter medications on file as of 06/16/2022.    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   Osteoporosis    SCC (squamous cell carcinoma)    Urticaria    Past Surgical History:  Procedure Laterality Date   ABDOMINAL HYSTERECTOMY     no oophorectomy   BREAST LUMPECTOMY     x2 left breast   R leg liposarcoma  1973   SQUAMOUS CELL CARCINOMA EXCISION     RLE   TONSILLECTOMY     Family History  Problem Relation Age of Onset   Heart disease Mother        congenital heart defect   Cancer Father        kidney cancer   Cancer Brother        Brain Cancer   Cancer Maternal Aunt        breast cancer   Cancer  Paternal Aunt        breast cancer   Colon cancer Neg Hx    Diabetes Neg Hx    Allergic rhinitis Neg Hx    Asthma Neg Hx    Eczema Neg Hx    Urticaria Neg Hx    Social History   Socioeconomic History   Marital status: Married    Spouse name: Not on file   Number of children: 2   Years of education: Not on file   Highest education level: Not on file  Occupational History   Occupation: retired -- Sales executive, class room assistant   Tobacco Use   Smoking status: Never   Smokeless tobacco: Never  Vaping Use   Vaping Use: Never used  Substance and Sexual Activity   Alcohol use: No   Drug use: No   Sexual activity: Not Currently    Birth control/protection: Post-menopausal  Other Topics Concern   Not on file  Social History Narrative   Lost a son when he was 81 y/o   Lives w/ husband      *02/09/2022 5th squamous cell removal two weeks ago left leg near ankle - in one year.   Social Determinants of Health   Financial Resource Strain: Low Risk  (06/14/2022)   Overall Financial Resource Strain (CARDIA)    Difficulty of Paying Living Expenses: Not hard at all  Food Insecurity: No Food Insecurity (06/14/2022)   Hunger Vital Sign    Worried About Running Out of Food in the Last Year: Never true    Ran Out of Food in the Last Year: Never true  Transportation Needs: No Transportation Needs (06/14/2022)   PRAPARE - Administrator, Civil Service (Medical): No    Lack of Transportation (Non-Medical): No  Physical Activity: Sufficiently Active (06/14/2022)   Exercise Vital Sign    Days of Exercise per Week: 6 days    Minutes of Exercise per Session: 40 min  Stress: No Stress Concern Present (06/14/2022)   [employer] of Occupational Health - Occupational Stress Questionnaire    Feeling of Stress : Not at all  Social Connections: Unknown (06/14/2022)   Social Connection and Isolation Panel [NHANES]    Frequency of Communication with Friends and Family: Three  times a week    Frequency of Social Gatherings with Friends and Family: Twice a week    Attends Religious Services: Not on Marketing executive or Organizations: Yes    Attends Banker Meetings: 1 to 4 times per year    Marital Status: Married    Tobacco Counseling Counseling given: Not Answered   Clinical  Intake:  Pre-visit preparation completed: Yes  Pain : No/denies pain  BMI - recorded: 25 Nutritional Status: BMI 25 -29 Overweight Nutritional Risks: None Diabetes: No  How often do you need to have someone help you when you read instructions, pamphlets, or other written materials from your doctor or pharmacy?: 1 - Never   Activities of Daily Living    06/14/2022    5:18 PM  In your present state of health, do you have any difficulty performing the following activities:  Hearing? 0  Vision? 0  Difficulty concentrating or making decisions? 0  Walking or climbing stairs? 0  Dressing or bathing? 0  Doing errands, shopping? 0  Preparing Food and eating ? N  Using the Toilet? N  In the past six months, have you accidently leaked urine? N  Do you have problems with loss of bowel control? N  Managing your Medications? N  Managing your Finances? N  Housekeeping or managing your Housekeeping? N    Patient Care Team: Wanda Plump, MD as PCP - General (Internal Medicine) Elmon Else, MD as Consulting Physician (Dermatology) Mat Carne, DO as Consulting Physician (Optometry) Salvatore Marvel, MD as Consulting Physician (Orthopedic Surgery) Beshears, Marrian Salvage, DMD as Consulting Physician (Dentistry) Runell Gess, MD as Consulting Physician (Cardiology)  Indicate any recent Medical Services you may have received from other than [facility] providers in the past year (date may be approximate).     Assessment:   This is a routine wellness examination for Leteisha.  Hearing/Vision screen No results found.  Dietary issues and exercise activities  discussed: Current Exercise Habits: Home exercise routine, Type of exercise: walking, Time (Minutes): 40, Frequency (Times/Week): 6, Weekly Exercise (Minutes/Week): 240, Intensity: Moderate, Exercise limited by: None identified   Goals Addressed   None    Depression Screen    06/16/2022    9:07 AM 03/24/2022   10:24 AM 09/21/2021    9:01 AM 06/09/2021    3:45 PM 03/23/2021    9:45 AM 08/30/2020    8:11 AM 06/03/2020    2:08 PM  PHQ 2/9 Scores  PHQ - 2 Score 0 2 0 0 0 0 1  PHQ- 9 Score  4         Fall Risk    06/14/2022    5:18 PM 03/24/2022   10:04 AM 09/21/2021    9:01 AM 06/09/2021    3:45 PM 03/23/2021    9:45 AM  Fall Risk   Falls in the past year? 0 0 0 0 0  Number falls in past yr: 0 0 0 0 0  Injury with Fall? 0 0 0 0 0  Risk for fall due to : No Fall Risks   No Fall Risks   Follow up Falls evaluation completed Falls evaluation completed Falls evaluation completed Falls evaluation completed Falls evaluation completed    FALL RISK PREVENTION PERTAINING TO THE HOME:  Any stairs in or around the home? No  Home free of loose throw rugs in walkways, pet beds, electrical cords, etc? Yes  Adequate lighting in your home to reduce risk of falls? Yes   ASSISTIVE DEVICES UTILIZED TO PREVENT FALLS:  Life alert? No  Use of a cane, walker or w/c? No  Grab bars in the bathroom? Yes  Shower chair or bench in shower? Yes  Elevated toilet seat or a handicapped toilet? Yes   TIMED UP AND GO:  Was the test performed?  No, audio visit .  Cognitive Function:    03/31/2015    9:38 AM  MMSE - Mini Mental State Exam  Orientation to time 5  Orientation to Place 5  Registration 3  Attention/ Calculation 5  Recall 3  Language- name 2 objects 2  Language- repeat 1  Language- follow 3 step command 3  Language- read & follow direction 1  Write a sentence 1  Copy design 0  Total score 29        06/16/2022    9:13 AM 06/09/2021    3:50 PM  6CIT Screen  What Year? 0 points 0  points  What month? 0 points 0 points  What time? 0 points 0 points  Count back from 20 0 points 0 points  Months in reverse 0 points 0 points  Repeat phrase 0 points 2 points  Total Score 0 points 2 points    Immunizations Immunization History  Administered Date(s) Administered   Influenza Split 01/03/2012, 12/03/2017, 12/05/2019   Influenza Whole 01/07/2007, 11/27/2007, 03/26/2008, 11/12/2008, 11/04/2009   Influenza, High Dose Seasonal PF 11/29/2018, 12/02/2020, 12/16/2021   Influenza,inj,Quad PF,6+ Mos 01/28/2014   Influenza-Unspecified 12/23/2014, 11/27/2015, 12/09/2016   PFIZER(Purple Top)SARS-COV-2 Vaccination 04/12/2019, 05/07/2019, 12/03/2019, 07/24/2020   PNEUMOCOCCAL CONJUGATE-20 03/23/2021   Pfizer Covid-19 Vaccine Bivalent Booster 93yrs & up 12/17/2020, 11/29/2021   Pneumococcal Conjugate-13 02/09/2015   Pneumococcal Polysaccharide-23 01/02/2011   Respiratory Syncytial Virus Vaccine,Recomb Aduvanted(Arexvy) 01/06/2022   Tdap 01/02/2011, 04/11/2021   Zoster Recombinat (Shingrix) 04/18/2017, 07/07/2017   Zoster, Live 02/07/2007    TDAP status: Up to date  Flu Vaccine status: Up to date  Pneumococcal vaccine status: Up to date  Covid-19 vaccine status: Information provided on how to obtain vaccines.   Qualifies for Shingles Vaccine? Yes   Zostavax completed Yes   Shingrix Completed?: Yes  Screening Tests Health Maintenance  Topic Date Due   Medicare Annual Wellness (AWV)  06/10/2022   COVID-19 Vaccine (7 - 2023-24 season) 09/21/2022 (Originally 01/24/2022)   INFLUENZA VACCINE  10/05/2022   MAMMOGRAM  01/19/2023   DTaP/Tdap/Td (3 - Td or Tdap) 04/12/2031   Pneumonia Vaccine 17+ Years old  Completed   DEXA SCAN  Completed   Hepatitis C Screening  Completed   Zoster Vaccines- Shingrix  Completed   HPV VACCINES  Aged Out   COLONOSCOPY (Pts 45-54yrs Insurance coverage will need to be confirmed)  Discontinued    Health Maintenance  Health Maintenance Due   Topic Date Due   Medicare Annual Wellness (AWV)  06/10/2022    Colorectal cancer screening: Type of screening: Colonoscopy. Completed 05/20/20. Repeat every N/a years  Mammogram status: Completed 01/18/22. Repeat every year  Bone Density status: Completed 06/29/21. Results reflect: Bone density results: OSTEOPENIA. Repeat every 2 years.  Lung Cancer Screening: (Low Dose CT Chest recommended if Age 27-80 years, 30 pack-year currently smoking OR have quit w/in 15years.) does not qualify.   Additional Screening:  Hepatitis C Screening: does qualify; Completed 02/09/15  Vision Screening: Recommended annual ophthalmology exams for early detection of glaucoma and other disorders of the eye. Is the patient up to date with their annual eye exam?  Yes  Who is the provider or what is the name of the office in which the patient attends annual eye exams? Dr[facility]. If pt is not established with a provider, would they like to be referred to a provider to establish care? No .   Dental Screening: Recommended annual dental exams for proper oral hygiene  Community  Resource Referral / Chronic Care Management: CRR required this visit?  No   CCM required this visit?  No      Plan:     I have personally reviewed and noted the following in the patient's chart:   Medical and social history Use of alcohol, tobacco or illicit drugs  Current medications and supplements including opioid prescriptions. Patient is not currently taking opioid prescriptions. Functional ability and status Nutritional status Physical activity Advanced directives List of other physicians Hospitalizations, surgeries, and ER visits in previous 12 months Vitals Screenings to include cognitive, depression, and falls Referrals and appointments  In addition, I have reviewed and discussed with patient certain preventive protocols, quality metrics, and best practice recommendations. A written personalized  care plan for preventive services as well as general preventive health recommendations were provided to patient.   Due to this being a telephonic visit, the after visit summary with patients personalized plan was offered to patient via mail or my-chart. Patient would like to access on my-chart.  Donne Anon, [place]   06/16/2022   Nurse Notes: None

## 2022-06-20 ENCOUNTER — Encounter (HOSPITAL_BASED_OUTPATIENT_CLINIC_OR_DEPARTMENT_OTHER): Payer: Medicare HMO | Admitting: General Surgery

## 2022-06-20 DIAGNOSIS — E785 Hyperlipidemia, unspecified: Secondary | ICD-10-CM | POA: Diagnosis not present

## 2022-06-20 DIAGNOSIS — L988 Other specified disorders of the skin and subcutaneous tissue: Secondary | ICD-10-CM | POA: Diagnosis not present

## 2022-06-20 DIAGNOSIS — E039 Hypothyroidism, unspecified: Secondary | ICD-10-CM | POA: Diagnosis not present

## 2022-06-20 DIAGNOSIS — L97212 Non-pressure chronic ulcer of right calf with fat layer exposed: Secondary | ICD-10-CM | POA: Diagnosis not present

## 2022-06-20 DIAGNOSIS — L97818 Non-pressure chronic ulcer of other part of right lower leg with other specified severity: Secondary | ICD-10-CM | POA: Diagnosis not present

## 2022-06-20 DIAGNOSIS — I10 Essential (primary) hypertension: Secondary | ICD-10-CM | POA: Diagnosis not present

## 2022-06-20 DIAGNOSIS — J45909 Unspecified asthma, uncomplicated: Secondary | ICD-10-CM | POA: Diagnosis not present

## 2022-06-20 DIAGNOSIS — L02415 Cutaneous abscess of right lower limb: Secondary | ICD-10-CM | POA: Diagnosis not present

## 2022-06-20 DIAGNOSIS — I872 Venous insufficiency (chronic) (peripheral): Secondary | ICD-10-CM | POA: Diagnosis not present

## 2022-06-20 DIAGNOSIS — L97812 Non-pressure chronic ulcer of other part of right lower leg with fat layer exposed: Secondary | ICD-10-CM | POA: Diagnosis not present

## 2022-06-20 NOTE — Progress Notes (Addendum)
ALAIRA, LEVEL ([id number]) [id number] Page 1 of 12 Visit Report for 06/20/2022 Chief Complaint Document Details Patient Name: Date of Service: Misty, [name] 06/20/2022 8:30 A M Medical Record Number: [id number] Patient Account Number: [id number] Date of Birth/Sex: Treating RN: 03-07-1945 (77 y.Misty. F) Primary Care Provider: Willow Ora Other Clinician: Referring Provider: Treating Provider/Extender: Cresenciano Genre in Treatment: 15 Information Obtained from: Patient Chief Complaint Patient seen for complaints of Non-Healing Wound. Electronic Signature(s) Signed: 06/20/2022 8:40:18 AM By: Duanne Guess MD FACS Entered By: Duanne Guess on 06/20/2022 08:40:18 -------------------------------------------------------------------------------- Debridement Details Patient Name: Date of Service: Misty Val Eagle [name], [name] 06/20/2022 8:30 A M Medical Record Number: [id number] Patient Account Number: [id number] Date of Birth/Sex: Treating RN: [birth year] (68 y.Misty. [name] Primary Care Provider: Willow Ora Other Clinician: Referring Provider: Treating Provider/Extender: Cresenciano Genre in Treatment: 15 Debridement Performed for Assessment: Wound #1 Right Achilles Performed By: Physician Duanne Guess, MD Debridement Type: Debridement Level of Consciousness (Pre-procedure): Awake and Alert Pre-procedure Verification/Time Out Yes - 08:47 Taken: Start Time: 08:47 Pain Control: Lidocaine 4% T opical Solution T Area Debrided (L x W): otal 1 (cm) x 1.5 (cm) = 1.5 (cm) Tissue and other material debrided: Non-Viable, Slough, Slough Level: Non-Viable Tissue Debridement Description: Selective/Open Wound Instrument: Curette Bleeding: Minimum Hemostasis Achieved: Pressure Response to Treatment: Procedure was tolerated well Level of Consciousness (Post- Awake and Alert procedure): Post Debridement Measurements of Total  Wound Length: (cm) 1 Width: (cm) 1.5 Depth: (cm) 0.1 Volume: (cm) 0.118 Character of Wound/Ulcer Post Debridement: Improved Post Procedure Diagnosis Same as Pre-procedure Notes scribed for Dr. Lady Gary by Samuella Bruin, RN Electronic Signature(s) Signed: 06/20/2022 10:57:43 AM By: Duanne Guess MD FACS Signed: 06/20/2022 3:54:21 PM By: Samuella Bruin Entered By: Samuella Bruin on 06/20/2022 08:50:01 [name] ([id number]) [id number] Page 2 of 12 -------------------------------------------------------------------------------- Debridement Details Patient Name: Date of Service: Misty [name] 06/20/2022 8:30 A M Medical Record Number: [id number] Patient Account Number: [id number] Date of Birth/Sex: Treating RN: Nov 08, 1945 (15 y.Misty. [name], [name] Primary Care Provider: Willow Ora Other Clinician: Referring Provider: Treating Provider/Extender: Cresenciano Genre in Treatment: 15 Debridement Performed for Assessment: Wound #4 Right,Distal,Posterior Lower Leg Performed By: Physician Duanne Guess, MD Debridement Type: Debridement Level of Consciousness (Pre-procedure): Awake and Alert Pre-procedure Verification/Time Out Yes - 08:47 Taken: Start Time: 08:47 Pain Control: Lidocaine 4% T opical Solution T Area Debrided (L x W): otal 0.5 (cm) x 0.2 (cm) = 0.1 (cm) Tissue and other material debrided: Non-Viable, Slough, Slough Level: Non-Viable Tissue Debridement Description: Selective/Open Wound Instrument: Curette Bleeding: Minimum Hemostasis Achieved: Pressure Response to Treatment: Procedure was tolerated well Level of Consciousness (Post- Awake and Alert procedure): Post Debridement Measurements of Total Wound Length: (cm) 0.5 Width: (cm) 0.2 Depth: (cm) 0.1 Volume: (cm) 0.008 Character of Wound/Ulcer Post Debridement: Improved Post Procedure Diagnosis Same as Pre-procedure Notes scribed for Dr. Lady Gary  by Samuella Bruin, RN Electronic Signature(s) Signed: 06/20/2022 10:57:43 AM By: Duanne Guess MD FACS Signed: 06/20/2022 3:54:21 PM By: Samuella Bruin Entered By: Samuella Bruin on 06/20/2022 08:50:16 -------------------------------------------------------------------------------- Debridement Details Patient Name: Date of Service: Misty [name], [name] 06/20/2022 8:30 A M Medical Record Number: [id number] Patient Account Number: [id number] Date of Birth/Sex: Treating RN: 19-Feb-1946 (20 y.Misty. [name] Primary Care Provider: Willow Ora Other Clinician: Referring Provider: Treating Provider/Extender: Cresenciano Genre in Treatment: 15 Debridement Performed for Assessment: Wound #2 Right,Posterior Lower Leg Performed By:  Physician Duanne Guess, MD Debridement Type: Debridement Severity of Tissue Pre Debridement: Fat layer exposed Level of Consciousness (Pre-procedure): Awake and Alert Pre-procedure Verification/Time Out Yes - 08:47 Taken: Start Time: 08:47 Pain Control: Lidocaine 4% T opical Solution T Area Debrided (L x W): otal 0.3 (cm) x 0.3 (cm) = 0.09 (cm) Tissue and other material debrided: Non-Viable, Slough, Slough Level: Non-Viable Tissue Debridement Description: Selective/Open Wound Instrument: Curette Bleeding: Minimum Hemostasis Achieved: Pressure Response to Treatment: Procedure was tolerated well Level of Consciousness ([address]DESA, RECH V ([id number]) [id number] Page 3 of 12 Level of Consciousness (Post- Awake and Alert procedure): Post Debridement Measurements of Total Wound Length: (cm) 0.3 Width: (cm) 0.3 Depth: (cm) 0.1 Volume: (cm) 0.007 Character of Wound/Ulcer Post Debridement: Improved Severity of Tissue Post Debridement: Fat layer exposed Post Procedure Diagnosis Same as Pre-procedure Notes scribed for Dr. Lady Gary by Samuella Bruin, RN Electronic Signature(s) Signed: 06/20/2022  10:57:43 AM By: Duanne Guess MD FACS Signed: 06/20/2022 3:54:21 PM By: Samuella Bruin Entered By: Samuella Bruin on 06/20/2022 08:50:34 -------------------------------------------------------------------------------- HPI Details Patient Name: Date of Service: Misty [name], [name] 06/20/2022 8:30 A M Medical Record Number: [id number] Patient Account Number: [id number] Date of Birth/Sex: Treating RN: 1946-02-11 (55 y.Misty. F) Primary Care Provider: Willow Ora Other Clinician: Referring Provider: Treating Provider/Extender: Cresenciano Genre in Treatment: 15 History of Present Illness HPI Description: ADMISSION 03/01/2022 This is a 77 year old otherwise fairly healthy woman (not diabetic, not obese, does not smoke) who presents to the clinic with an ulcer on her right posterior leg. She has a history of a liposarcoma excised in 1973. The patient is not sure but believes she received radiation therapy to the location. She did have a skin graft. About 6 months ago, she developed a small ulcer in the area that seemed to start as an abscess. She has had an open wound in that area since then. A shave biopsy was taken by a dermatologist that showed inflammation without evidence of malignancy. She has been applying Vaseline to the site along with periwound TCA. ABI in clinic today is 0.94. Due to the failure of the wound to heal properly, she has been referred to the wound care center for further evaluation and management. 03/09/2022: The tissue on the wound is protruding further from the skin surface. It does not have a typical appearance consistent with hypertrophic granulation tissue. I am concerned that it may represent malignancy. 03/17/2022: Fortunately, the biopsy that I took last week was negative for malignancy. It returned as consistent with an ulcer and inflamed granulation tissue. She still has hypertrophic granulation tissue at the site and some slough accumulation on the  surface. No significant change in the wound dimensions. 03/23/2022: The wound measured smaller today and the hypertrophic granulation tissue is less prominent. Due to the unusual shape of her leg from old surgery, however, there has been some friction from her compressive wrap. 03/31/2022: The wound is slightly smaller today. There is light slough on the surface. There is still some hypertrophic granulation tissue present. 04/12/2022: She has a new wound near the top of her surgical defect. Where the skin rolls over, there has been some friction and abrasion that has resulted in tissue breakdown. There is slough buildup on the surface. The original wound is smaller and there is epithelialization around the borders. There is slough buildup on the surface again. She has been approved for TheraSkin but it is unclear what her financial responsibility would be at this point. We are  still working on determining that. 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 04/17/2022: Both posterior leg wounds are smaller today. They both have slough accumulation. She had her Mohs surgery and there is a defect on her anterior tibial surface. I spoke with her dermatologist and we are going to assume care for this wound. There is a layer of rubbery slough on this wound and it appears a little dry. 04/24/2022: All 3 wounds have a thick layer of rubbery slough on them. There is hypertrophic granulation tissue underneath this on both the anterior tibial surface wound and the posterior Achilles wound. She has a wound on her right forearm that was cultured by her dermatologist and grew out heavy Serratia marcescens and moderate Staph aureus. Apparently they are just having her apply topical mupirocin to the site because she has previously had a reaction to moxifloxacin, which is apparently the antibiotic they wanted to use. She asks if I have any other  recommendations. 05/01/2022: All 3 wounds look better this week. The hypertrophic granulation tissue has resolved on her anterior tibia and posterior Achilles wound. The wounds are all smaller. They have some slough accumulation. The moisture balance on the anterior tibial wound is good; the posterior wounds are a bit dry. 05/08/2022: The anterior tibial wound is smaller without any hypertrophic granulation tissue. There is a little bit of slough buildup. There is more slough on the posterior Achilles wound. The posterior wound under the flap of skin is a little bit macerated. 05/15/2022: The anterior tibial wound is healed. The posterior Achilles wound has heavy slough buildup. The posterior wound under the flap of skin continues to be macerated with slough accumulation. 05/29/2022: We are still dealing with a lot of moisture accumulation under the skin flap. There was an odor coming from the wound today when her dressing was removed. The Achilles wound has slough accumulation once again. [name], [name] ([id number]) [id number] Page 4 of 12 06/05/2022: Unfortunately, she seems to have had an allergic reaction to some component of her dressing, possibly the medium for the TCA. She has broken out on her entire lower leg. There has been more moisture accumulation under the flap of tissue on her posterior leg and a linear ulcer has opened just caudal to the existing ulcer in this site. There is slough on the Achilles wound, but there is also more epithelialization. 06/13/2022: All of the wounds look significantly better this week. We have much better moisture control on the posterior leg near the flap of tissue. There is some slough accumulation on all wound surfaces. Her skin looks less angry this week, as well. 06/20/2022: Both of the small wounds near the skin fold have contracted. There is some slough on the surface. The initial wound has some thick rubbery slough on the surface but  is epithelializing. Her skin is in better condition this week. Electronic Signature(s) Signed: 06/20/2022 9:28:37 AM By: Duanne Guess MD FACS Previous Signature: 06/20/2022 9:21:04 AM Version By: Duanne Guess MD FACS Entered By: Duanne Guess on 06/20/2022 09:28:36 -------------------------------------------------------------------------------- Physical Exam Details Patient Name: Date of Service: Misty [name], [name] 06/20/2022 8:30 A M Medical Record Number: [id number] Patient Account Number: [id number] Date of Birth/Sex: Treating RN: Jul 18, 1945 (24 y.Misty. F) Primary Care Provider: Willow Ora Other Clinician: Referring Provider: Treating Provider/Extender: Cresenciano Genre in Treatment: 15 Constitutional Hypertensive, asymptomatic. . . . no acute distress. Respiratory Normal work of breathing on  room air. Notes 06/20/2022: Both of the small wounds near the skin fold have contracted. There is some slough on the surface. The initial wound has some thick rubbery slough on the surface but is epithelializing. Her skin is in better condition this week. Electronic Signature(s) Signed: 06/20/2022 9:29:57 AM By: Duanne Guess MD FACS Entered By: Duanne Guess on 06/20/2022 09:29:57 -------------------------------------------------------------------------------- Physician Orders Details Patient Name: Date of Service: Misty [name], Jaselynn V. 06/20/2022 8:30 A M Medical Record Number: [id number] Patient Account Number: [id number] Date of Birth/Sex: Treating RN: Dec 24, 1945 (50 y.Misty. [name] Primary Care Provider: Willow Ora Other Clinician: Referring Provider: Treating Provider/Extender: Cresenciano Genre in Treatment: 15 Verbal / Phone Orders: No Diagnosis Coding ICD-10 Coding Code Description [phone] Non-pressure chronic ulcer of other part of right lower leg with fat layer exposed L97.818 Non-pressure chronic ulcer of other part of right lower leg  with other specified severity I10 Essential (primary) hypertension Z85.831 Personal history of malignant neoplasm of soft tissue Z92.3 Personal history of irradiation Follow-up Appointments ppointment in 1 week. - Dr. Lady Gary Room 2 - 4/23 at 1:30 overflow Return A Anesthetic (In clinic) Topical Lidocaine 4% applied to wound bed Bathing/ Shower/ Hygiene May shower with protection but do not get wound dressing(s) wet. Protect dressing(s) with water repellant cover (for example, large plastic bag) or a cast cover and may then take shower. - Please do not get leg wraps wet on Right Leg. HELLENA, PRIDGEN ([id number]) [id number] Page 5 of 12 May use a cast protector on right leg- can purchase from [employer]; Medical supply store; [facility] etc. the cast protector costs approximately $18-$29 Edema Control - Lymphedema / SCD / Other void standing for long periods of time. - Rest and elevate legs throughout the day. A Moisturize legs daily. - Use a lotion or moisturizer for example :Sween,Eucerin;Aquaphor etc. Wound Treatment Wound #1 - Achilles Wound Laterality: Right Cleanser: Soap and Water 1 x Per Week/30 Days Discharge Instructions: May shower and wash wound with dial antibacterial soap and water prior to dressing change. Cleanser: Wound Cleanser 1 x Per Week/30 Days Discharge Instructions: Cleanse the wound with wound cleanser prior to applying a clean dressing using gauze sponges, not tissue or cotton balls. Peri-Wound Care: Ketoconazole Cream 2% 1 x Per Week/30 Days Discharge Instructions: Apply Ketoconazole as directed Peri-Wound Care: Zinc Oxide Ointment 30g tube 1 x Per Week/30 Days Discharge Instructions: Apply Zinc Oxide to periwound with each dressing change Peri-Wound Care: Sween Lotion (Moisturizing lotion) 1 x Per Week/30 Days Discharge Instructions: Apply moisturizing lotion as directed Prim Dressing: Maxorb Extra Ag+ Alginate Dressing, 2x2 (in/in) 1 x Per  Week/30 Days ary Discharge Instructions: Apply to wound bed as instructed Secondary Dressing: Zetuvit Plus 4x8 in 1 x Per Week/30 Days Discharge Instructions: Apply over primary dressing as directed. Compression Wrap: ThreePress (3 layer compression wrap) 1 x Per Week/30 Days Discharge Instructions: Apply three layer compression as directed. Compression Wrap: Netting,Tubular #5 1 x Per Week/30 Days Wound #2 - Lower Leg Wound Laterality: Right, Posterior Cleanser: Soap and Water 1 x Per Week/30 Days Discharge Instructions: May shower and wash wound with dial antibacterial soap and water prior to dressing change. Cleanser: Wound Cleanser 1 x Per Week/30 Days Discharge Instructions: Cleanse the wound with wound cleanser prior to applying a clean dressing using gauze sponges, not tissue or cotton balls. Peri-Wound Care: Ketoconazole Cream 2% 1 x Per Week/30 Days Discharge Instructions: Apply Ketoconazole as directed Peri-Wound Care: Zinc Oxide Ointment  30g tube 1 x Per Week/30 Days Discharge Instructions: Apply Zinc Oxide to periwound with each dressing change Peri-Wound Care: Sween Lotion (Moisturizing lotion) 1 x Per Week/30 Days Discharge Instructions: Apply moisturizing lotion as directed Prim Dressing: Maxorb Extra Ag+ Alginate Dressing, 2x2 (in/in) 1 x Per Week/30 Days ary Discharge Instructions: Apply to wound bed as instructed Secondary Dressing: Zetuvit Plus 4x8 in 1 x Per Week/30 Days Discharge Instructions: Apply over primary dressing as directed. Compression Wrap: ThreePress (3 layer compression wrap) 1 x Per Week/30 Days Discharge Instructions: Apply three layer compression as directed. Compression Wrap: Netting,Tubular #5 1 x Per Week/30 Days Wound #4 - Lower Leg Wound Laterality: Right, Posterior, Distal Cleanser: Soap and Water 1 x Per Week/30 Days Discharge Instructions: May shower and wash wound with dial antibacterial soap and water prior to dressing change. Cleanser:  Wound Cleanser 1 x Per Week/30 Days Discharge Instructions: Cleanse the wound with wound cleanser prior to applying a clean dressing using gauze sponges, not tissue or cotton balls. Peri-Wound Care: Ketoconazole Cream 2% 1 x Per Week/30 Days Discharge Instructions: Apply Ketoconazole as directed Peri-Wound Care: Zinc Oxide Ointment 30g tube 1 x Per Week/30 Days Discharge Instructions: Apply Zinc Oxide to periwound with each dressing change Peri-Wound Care: Sween Lotion (Moisturizing lotion) 1 x Per Week/30 Days Discharge Instructions: Apply moisturizing lotion as directed ERIKAH, THUMM ([id number]) [id number] Page 6 of 12 Prim Dressing: Maxorb Extra Ag+ Alginate Dressing, 2x2 (in/in) 1 x Per Week/30 Days ary Discharge Instructions: Apply to wound bed as instructed Secondary Dressing: Zetuvit Plus 4x8 in 1 x Per Week/30 Days Discharge Instructions: Apply over primary dressing as directed. Compression Wrap: ThreePress (3 layer compression wrap) 1 x Per Week/30 Days Discharge Instructions: Apply three layer compression as directed. Compression Wrap: Netting,Tubular #5 1 x Per Week/30 Days Patient Medications llergies: bee venom protein (honey bee), moxifloxacin, promethazine HCl, balsam [place], thimerosal A Notifications Medication Indication Start End 06/20/2022 lidocaine DOSE topical 4 % cream - cream topical Electronic Signature(s) Signed: 06/20/2022 10:57:43 AM By: Duanne Guess MD FACS Entered By: Duanne Guess on 06/20/2022 09:31:06 -------------------------------------------------------------------------------- Problem List Details Patient Name: Date of Service: Misty [name], Daven V. 06/20/2022 8:30 A M Medical Record Number: [id number] Patient Account Number: [id number] Date of Birth/Sex: Treating RN: [birth year] (54 y.Misty. F) Primary Care Provider: Willow Ora Other Clinician: Referring Provider: Treating Provider/Extender: Cresenciano Genre  in Treatment: 15 Active Problems ICD-10 Encounter Code Description Active Date MDM Diagnosis L97.812 Non-pressure chronic ulcer of other part of right lower leg with fat layer 03/01/2022 No Yes exposed L97.818 Non-pressure chronic ulcer of other part of right lower leg with other specified 04/17/2022 No Yes severity I10 Essential (primary) hypertension 03/01/2022 No Yes Z85.831 Personal history of malignant neoplasm of soft tissue 03/01/2022 No Yes Z92.3 Personal history of irradiation 03/01/2022 No Yes Inactive Problems Resolved Problems Electronic Signature(s) Signed: 06/20/2022 8:39:58 AM By: Duanne Guess MD FACS Entered By: Duanne Guess on 06/20/2022 08:39:58 [name] ([id number]) [id number] Page 7 of 12 -------------------------------------------------------------------------------- Progress Note Details Patient Name: Date of Service: [name], [name] 06/20/2022 8:30 A M Medical Record Number: [id number] Patient Account Number: [id number] Date of Birth/Sex: Treating RN: 1945/12/17 (15 y.Misty. F) Primary Care Provider: Willow Ora Other Clinician: Referring Provider: Treating Provider/Extender: Cresenciano Genre in Treatment: 15 Subjective Chief Complaint Information obtained from Patient Patient seen for complaints of Non-Healing Wound. History of Present Illness (HPI) ADMISSION 03/01/2022 This is a 77 year old otherwise fairly healthy  woman (not diabetic, not obese, does not smoke) who presents to the clinic with an ulcer on her right posterior leg. She has a history of a liposarcoma excised in 1973. The patient is not sure but believes she received radiation therapy to the location. She did have a skin graft. About 6 months ago, she developed a small ulcer in the area that seemed to start as an abscess. She has had an open wound in that area since then. A shave biopsy was taken by a dermatologist that showed inflammation  without evidence of malignancy. She has been applying Vaseline to the site along with periwound TCA. ABI in clinic today is 0.94. Due to the failure of the wound to heal properly, she has been referred to the wound care center for further evaluation and management. 03/09/2022: The tissue on the wound is protruding further from the skin surface. It does not have a typical appearance consistent with hypertrophic granulation tissue. I am concerned that it may represent malignancy. 03/17/2022: Fortunately, the biopsy that I took last week was negative for malignancy. It returned as consistent with an ulcer and inflamed granulation tissue. She still has hypertrophic granulation tissue at the site and some slough accumulation on the surface. No significant change in the wound dimensions. 03/23/2022: The wound measured smaller today and the hypertrophic granulation tissue is less prominent. Due to the unusual shape of her leg from old surgery, however, there has been some friction from her compressive wrap. 03/31/2022: The wound is slightly smaller today. There is light slough on the surface. There is still some hypertrophic granulation tissue present. 04/12/2022: She has a new wound near the top of her surgical defect. Where the skin rolls over, there has been some friction and abrasion that has resulted in tissue breakdown. There is slough buildup on the surface. The original wound is smaller and there is epithelialization around the borders. There is slough buildup on the surface again. She has been approved for TheraSkin but it is unclear what her financial responsibility would be at this point. We are still working on determining that. 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 04/17/2022: Both posterior leg wounds are smaller today. They both have slough accumulation. She had her Mohs surgery and there is a defect on her anterior tibial surface.  I spoke with her dermatologist and we are going to assume care for this wound. There is a layer of rubbery slough on this wound and it appears a little dry. 04/24/2022: All 3 wounds have a thick layer of rubbery slough on them. There is hypertrophic granulation tissue underneath this on both the anterior tibial surface wound and the posterior Achilles wound. She has a wound on her right forearm that was cultured by her dermatologist and grew out heavy Serratia marcescens and moderate Staph aureus. Apparently they are just having her apply topical mupirocin to the site because she has previously had a reaction to moxifloxacin, which is apparently the antibiotic they wanted to use. She asks if I have any other recommendations. 05/01/2022: All 3 wounds look better this week. The hypertrophic granulation tissue has resolved on her anterior tibia and posterior Achilles wound. The wounds are all smaller. They have some slough accumulation. The moisture balance on the anterior tibial wound is good; the posterior wounds are a bit dry. 05/08/2022: The anterior tibial wound is smaller without any hypertrophic granulation tissue. There is a little bit of slough buildup.  There is more slough on the posterior Achilles wound. The posterior wound under the flap of skin is a little bit macerated. 05/15/2022: The anterior tibial wound is healed. The posterior Achilles wound has heavy slough buildup. The posterior wound under the flap of skin continues to be macerated with slough accumulation. 05/29/2022: We are still dealing with a lot of moisture accumulation under the skin flap. There was an odor coming from the wound today when her dressing was removed. The Achilles wound has slough accumulation once again. 06/05/2022: Unfortunately, she seems to have had an allergic reaction to some component of her dressing, possibly the medium for the TCA. She has broken out on her entire lower leg. There has been more moisture  accumulation under the flap of tissue on her posterior leg and a linear ulcer has opened just caudal to the existing ulcer in this site. There is slough on the Achilles wound, but there is also more epithelialization. 06/13/2022: All of the wounds look significantly better this week. We have much better moisture control on the posterior leg near the flap of tissue. There is some slough accumulation on all wound surfaces. Her skin looks less angry this week, as well. 06/20/2022: Both of the small wounds near the skin fold have contracted. There is some slough on the surface. The initial wound has some thick rubbery slough on the surface but is epithelializing. Her skin is in better condition this week. Patient History Information obtained from Patient. Family History Unknown History. Social History Never smoker, Marital Status - Married, Alcohol Use - Never, Drug Use - No History, Caffeine Use - Never. Medical History Respiratory Patient has history of Asthma - Childhood asthma [name], [name] ([id number]) [id number] Page 8 of 12 Cardiovascular Patient has history of Hypertension Oncologic Patient has history of Received Radiation - R/T Liposarcoma in 1972 Hospitalization/Surgery History - Left Breast Lumpectomy;Tonsillectomy ; Squamous cell carcinoma; Hysterectomy;Right Leg liposarcoma. 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 no acute distress. Vitals  Time Taken: 8:30 AM, Height: 60 in, Weight: 126 lbs, BMI: 24.6, Temperature: 97.7 F, Pulse: 67 bpm, Respiratory Rate: 16 breaths/min, Blood Pressure: 154/75 mmHg. Respiratory Normal work of breathing on room air. General Notes: 06/20/2022: Both of the small wounds near the skin fold have contracted. There is some slough on the surface. The initial wound has some thick rubbery slough on the surface but is epithelializing. Her skin is in better condition this week. Integumentary (Hair, Skin) Wound #1 status is Open. Original cause of wound was Gradually Appeared. The date acquired was: 08/30/2021. The wound has been in treatment 15 weeks. The wound is located on the Right Achilles. The wound measures 1cm length x 1.5cm width x 0.1cm depth; 1.178cm^2 area and 0.118cm^3 volume. There is Fat Layer (Subcutaneous Tissue) exposed. There is no tunneling or undermining noted. There is a medium amount of serosanguineous drainage noted. The wound margin is distinct with the outline attached to the wound base. There is large (67-100%) red, hyper - granulation within the wound bed. There is a small (1-33%) amount of necrotic tissue within the wound bed including Adherent Slough. The periwound skin  appearance had no abnormalities noted for texture. The periwound skin appearance had no abnormalities noted for moisture. The periwound skin appearance exhibited: Rubor. The periwound skin appearance did not exhibit: Atrophie Blanche, Cyanosis, Ecchymosis, Hemosiderin Staining, Mottled, Pallor, Erythema. Periwound temperature was noted as No Abnormality. Wound #2 status is Open. Original cause of wound was Shear/Friction. The date acquired was: 04/08/2022. The wound has been in treatment 9 weeks. The wound is located on the Right,Posterior Lower Leg. The wound measures 0.3cm length x 0.3cm width x 0.1cm depth; 0.071cm^2 area and 0.007cm^3 volume. There is Fat Layer (Subcutaneous Tissue) exposed. There is no tunneling or  undermining noted. There is a medium amount of serosanguineous drainage noted. The wound margin is distinct with the outline attached to the wound base. There is small (1-33%) pink granulation within the wound bed. There is a large (67-100%) amount of necrotic tissue within the wound bed including Adherent Slough. The periwound skin appearance had no abnormalities noted for texture. The periwound skin appearance had no abnormalities noted for moisture. The periwound skin appearance did not exhibit: Atrophie Blanche, Cyanosis, Ecchymosis, Hemosiderin Staining, Mottled, Pallor, Rubor, Erythema. Periwound temperature was noted as No Abnormality. Wound #4 status is Open. Original cause of wound was Gradually Appeared. The date acquired was: 06/01/2022. The wound has been in treatment 2 weeks. The wound is located on the Right,Distal,Posterior Lower Leg. The wound measures 0.5cm length x 0.2cm width x 0.1cm depth; 0.079cm^2 area and 0.008cm^3 volume. There is Fat Layer (Subcutaneous Tissue) exposed. There is no tunneling or undermining noted. There is a medium amount of serosanguineous drainage noted. The wound margin is distinct with the outline attached to the wound base. There is small (1-33%) pink granulation within the wound bed. There is a large (67-100%) amount of necrotic tissue within the wound bed including Adherent Slough. The periwound skin appearance had no abnormalities noted for texture. The periwound skin appearance had no abnormalities noted for moisture. The periwound skin appearance had no abnormalities noted for color. Periwound temperature was noted as No Abnormality. Assessment Active Problems ICD-10 Non-pressure chronic ulcer of other part of right lower leg with fat layer exposed Non-pressure chronic ulcer of other part of right lower leg with other specified severity Essential (primary) hypertension Personal history of malignant neoplasm of soft tissue Personal history of  irradiation [name], MATTON ([id number]) [id number] Page 9 of 12 Procedures Wound #1 Pre-procedure diagnosis of Wound #1 is an Abscess located on the Right Achilles . There was a Selective/Open Wound Non-Viable Tissue Debridement with a total area of 1.5 sq cm performed by Duanne Guess, MD. With the following instrument(s): Curette to remove Non-Viable tissue/material. Material removed includes [facility] after achieving pain control using Lidocaine 4% Topical Solution. No specimens were taken. A time out was conducted at 08:47, prior to the start of the procedure. A Minimum amount of bleeding was controlled with Pressure. The procedure was tolerated well. Post Debridement Measurements: 1cm length x 1.5cm width x 0.1cm depth; 0.118cm^3 volume. Character of Wound/Ulcer Post Debridement is improved. Post procedure Diagnosis Wound #1: Same as Pre-Procedure General Notes: scribed for Dr. Lady Gary by Samuella Bruin, RN. Pre-procedure diagnosis of Wound #1 is an Abscess located on the Right Achilles . There was a Three Layer Compression Therapy Procedure by Samuella Bruin, RN. Post procedure Diagnosis Wound #1: Same as Pre-Procedure Wound #2 Pre-procedure diagnosis of Wound #2 is a Venous Leg Ulcer located on the Right,Posterior Lower Leg .Severity of Tissue Pre Debridement is: Fat layer exposed. There  was a Selective/Open Wound Non-Viable Tissue Debridement with a total area of 0.09 sq cm performed by Duanne Guess, MD. With the following instrument(s): Curette to remove Non-Viable tissue/material. Material removed includes [facility] after achieving pain control using Lidocaine 4% Topical Solution. No specimens were taken. A time out was conducted at 08:47, prior to the start of the procedure. A Minimum amount of bleeding was controlled with Pressure. The procedure was tolerated well. Post Debridement Measurements: 0.3cm length x 0.3cm width x 0.1cm depth; 0.007cm^3  volume. Character of Wound/Ulcer Post Debridement is improved. Severity of Tissue Post Debridement is: Fat layer exposed. Post procedure Diagnosis Wound #2: Same as Pre-Procedure General Notes: scribed for Dr. Lady Gary by Samuella Bruin, RN. Wound #4 Pre-procedure diagnosis of Wound #4 is a Lesion located on the Right,Distal,Posterior Lower Leg . There was a Selective/Open Wound Non-Viable Tissue Debridement with a total area of 0.1 sq cm performed by Duanne Guess, MD. With the following instrument(s): Curette to remove Non-Viable tissue/material. Material removed includes [facility] after achieving pain control using Lidocaine 4% Topical Solution. No specimens were taken. A time out was conducted at 08:47, prior to the start of the procedure. A Minimum amount of bleeding was controlled with Pressure. The procedure was tolerated well. Post Debridement Measurements: 0.5cm length x 0.2cm width x 0.1cm depth; 0.008cm^3 volume. Character of Wound/Ulcer Post Debridement is improved. Post procedure Diagnosis Wound #4: Same as Pre-Procedure General Notes: scribed for Dr. Lady Gary by Samuella Bruin, RN. Plan Follow-up Appointments: Return Appointment in 1 week. - Dr. Lady Gary Room 2 - 4/23 at 1:30 overflow Anesthetic: (In clinic) Topical Lidocaine 4% applied to wound bed Bathing/ Shower/ Hygiene: May shower with protection but do not get wound dressing(s) wet. Protect dressing(s) with water repellant cover (for example, large plastic bag) or a cast cover and may then take shower. - Please do not get leg wraps wet on Right Leg. May use a cast protector on right leg- can purchase from [employer]; Medical supply store; [facility] etc. the cast protector costs approximately $18-$29 Edema Control - Lymphedema / SCD / Other: Avoid standing for long periods of time. - Rest and elevate legs throughout the day. Moisturize legs daily. - Use a lotion or moisturizer for example :Sween,Eucerin;Aquaphor etc. The following  medication(s) was prescribed: lidocaine topical 4 % cream cream topical was prescribed at facility WOUND #1: - Achilles Wound Laterality: Right Cleanser: Soap and Water 1 x Per Week/30 Days Discharge Instructions: May shower and wash wound with dial antibacterial soap and water prior to dressing change. Cleanser: Wound Cleanser 1 x Per Week/30 Days Discharge Instructions: Cleanse the wound with wound cleanser prior to applying a clean dressing using gauze sponges, not tissue or cotton balls. Peri-Wound Care: Ketoconazole Cream 2% 1 x Per Week/30 Days Discharge Instructions: Apply Ketoconazole as directed Peri-Wound Care: Zinc Oxide Ointment 30g tube 1 x Per Week/30 Days Discharge Instructions: Apply Zinc Oxide to periwound with each dressing change Peri-Wound Care: Sween Lotion (Moisturizing lotion) 1 x Per Week/30 Days Discharge Instructions: Apply moisturizing lotion as directed Prim Dressing: Maxorb Extra Ag+ Alginate Dressing, 2x2 (in/in) 1 x Per Week/30 Days ary Discharge Instructions: Apply to wound bed as instructed Secondary Dressing: Zetuvit Plus 4x8 in 1 x Per Week/30 Days Discharge Instructions: Apply over primary dressing as directed. Com pression Wrap: ThreePress (3 layer compression wrap) 1 x Per Week/30 Days Discharge Instructions: Apply three layer compression as directed. Com pression Wrap: Netting,Tubular #5 1 x Per Week/30 Days WOUND #2: - Lower Leg Wound  Laterality: Right, Posterior Cleanser: Soap and Water 1 x Per Week/30 Days Discharge Instructions: May shower and wash wound with dial antibacterial soap and water prior to dressing change. Cleanser: Wound Cleanser 1 x Per Week/30 Days Discharge Instructions: Cleanse the wound with wound cleanser prior to applying a clean dressing using gauze sponges, not tissue or cotton balls. Peri-Wound Care: Ketoconazole Cream 2% 1 x Per Week/30 Days Discharge Instructions: Apply Ketoconazole as directed Peri-Wound Care: Zinc Oxide  Ointment 30g tube 1 x Per Week/30 Days Discharge Instructions: Apply Zinc Oxide to periwound with each dressing change Peri-Wound Care: Sween Lotion (Moisturizing lotion) 1 x Per Week/30 Days Discharge Instructions: Apply moisturizing lotion as directed Prim Dressing: Maxorb Extra Ag+ Alginate Dressing, 2x2 (in/in) 1 x Per Week/30 Days NAYANNA, SEABORN ([id number]) [id number] Page 10 of 12 Discharge Instructions: Apply to wound bed as instructed Secondary Dressing: Zetuvit Plus 4x8 in 1 x Per Week/30 Days Discharge Instructions: Apply over primary dressing as directed. Com pression Wrap: ThreePress (3 layer compression wrap) 1 x Per Week/30 Days Discharge Instructions: Apply three layer compression as directed. Com pression Wrap: Netting,Tubular #5 1 x Per Week/30 Days WOUND #4: - Lower Leg Wound Laterality: Right, Posterior, Distal Cleanser: Soap and Water 1 x Per Week/30 Days Discharge Instructions: May shower and wash wound with dial antibacterial soap and water prior to dressing change. Cleanser: Wound Cleanser 1 x Per Week/30 Days Discharge Instructions: Cleanse the wound with wound cleanser prior to applying a clean dressing using gauze sponges, not tissue or cotton balls. Peri-Wound Care: Ketoconazole Cream 2% 1 x Per Week/30 Days Discharge Instructions: Apply Ketoconazole as directed Peri-Wound Care: Zinc Oxide Ointment 30g tube 1 x Per Week/30 Days Discharge Instructions: Apply Zinc Oxide to periwound with each dressing change Peri-Wound Care: Sween Lotion (Moisturizing lotion) 1 x Per Week/30 Days Discharge Instructions: Apply moisturizing lotion as directed Prim Dressing: Maxorb Extra Ag+ Alginate Dressing, 2x2 (in/in) 1 x Per Week/30 Days ary Discharge Instructions: Apply to wound bed as instructed Secondary Dressing: Zetuvit Plus 4x8 in 1 x Per Week/30 Days Discharge Instructions: Apply over primary dressing as directed. Com pression Wrap:  ThreePress (3 layer compression wrap) 1 x Per Week/30 Days Discharge Instructions: Apply three layer compression as directed. Com pression Wrap: Netting,Tubular #5 1 x Per Week/30 Days 06/20/2022: Both of the small wounds near the skin fold have contracted. There is some slough on the surface. The initial wound has some thick rubbery slough on the surface but is epithelializing. Her skin is in better condition this week. I used a curette to debride slough from all of the wound surfaces. We will continue to apply silver alginate to each of her wounds. We will apply topical ketoconazole mixed with zinc oxide to the periwound skin and continue 3 layer compression. Follow-up in 1 week. Electronic Signature(s) Signed: 06/20/2022 9:31:52 AM By: Duanne Guess MD FACS Entered By: Duanne Guess on 06/20/2022 09:31:52 -------------------------------------------------------------------------------- HxROS Details Patient Name: Date of Service: Misty [name], Virtie V. 06/20/2022 8:30 A M Medical Record Number: [id number] Patient Account Number: [id number] Date of Birth/Sex: Treating RN: Jul 09, 1945 (76 y.Misty. 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 Respiratory Medical History: Positive for: Asthma - Childhood asthma Cardiovascular Medical History: Positive for: Hypertension Past Medical History Notes: Hx: Angio-edema r/t Bee venom; Hyperlipidemia Endocrine Medical History: Past  Medical History Notes: Hx: hypothyroidism Immunological Medical History: AUNDREYA, SOUFFRANT ([id number]) [id number] Page 11 of 12 Past Medical History Notes: Hx: Currently being treated with Immunotherapy (r/t Bee-Venom) Integumentary (Skin) Medical History: Past Medical  History Notes: Hx: Urticaria Musculoskeletal Medical History: Past Medical History Notes: Hx: Osteoporosis Oncologic Medical History: Positive for: Received Radiation - R/T Liposarcoma in 1972 Past Medical History Notes: 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)-Biopsy taken to rule out squamous cell carcinoma on right arm and right leg (02/24/22), still pending results Immunizations Pneumococcal Vaccine: Received Pneumococcal Vaccination: Yes Received Pneumococcal Vaccination On or After 60th Birthday: Yes Implantable Devices No devices added Hospitalization / Surgery History Type of Hospitalization/Surgery Left Breast Lumpectomy;Tonsillectomy ; Squamous cell carcinoma; Hysterectomy;Right Leg liposarcoma Family and Social History Unknown History: Yes; Never smoker; Marital Status - Married; Alcohol Use: Never; Drug Use: No History; Caffeine Use: Never; Financial Concerns: No; Food, Clothing or Shelter Needs: No; Support System Lacking: No; Transportation Concerns: No Electronic Signature(s) Signed: 06/20/2022 10:57:43 AM By: Duanne Guess MD FACS Entered By: Duanne Guess on 06/20/2022 40:98:11 -------------------------------------------------------------------------------- SuperBill Details Patient Name: Date of Service: Misty [name], [place] V. 06/20/2022 Medical Record Number: [id number] Patient Account Number: [id number] Date of Birth/Sex: Treating RN: 09/02/1945 (6 y.Misty. F) Primary Care Provider: Willow Ora Other Clinician: Referring Provider: Treating Provider/Extender: Elsie Saas Weeks in Treatment: 15 Diagnosis Coding ICD-10 Codes Code Description [phone] Non-pressure chronic ulcer of other part of right lower leg with fat layer exposed L97.818 Non-pressure chronic ulcer of other part of right lower leg with other specified severity I10 Essential (primary) hypertension Z85.831 Personal history of malignant neoplasm of soft  tissue Z92.3 Personal history of irradiation Facility Procedures : YANINA, KNUPP Code: [id number] E V ([id number]) Description: [phone] - DEBRIDE WOUND 1ST 20 SQ CM OR < ICD-10 Diagnosis Description L97.812 Non-pressure chronic ulcer of other part of right lower leg with fat layer exposed L97.818 Non-pressure chronic ulcer of other part of right lower leg with other  specified s [id number] Modifier: everity [id number] Quantity: 1 Page 12 of 12 Physician Procedures : CPT4 Code Description Modifier [phone] [id number] - WC PHYS LEVEL 3 - EST PT 25 ICD-10 Diagnosis Description L97.812 Non-pressure chronic ulcer of other part of right lower leg with fat layer exposed L97.818 Non-pressure chronic ulcer of other part of right  lower leg with other specified severity Z85.831 Personal history of malignant neoplasm of soft tissue Z92.3 Personal history of irradiation Quantity: 1 : [id number] - WC PHYS DEBR WO ANESTH 20 SQ CM ICD-10 Diagnosis Description L97.812 Non-pressure chronic ulcer of other part of right lower leg with fat layer exposed L97.818 Non-pressure chronic ulcer of other part of right lower leg with other  specified severity Quantity: 1 Electronic Signature(s) Signed: 06/20/2022 9:32:11 AM By: Duanne Guess MD FACS Entered By: Duanne Guess on 06/20/2022 09:32:11

## 2022-06-20 NOTE — Progress Notes (Signed)
[name], [name] ([id number]) [id number] Page 1 of 10 Visit Report for 06/20/2022 Arrival Information Details Patient Name: Date of Service: [name], [name] 06/20/2022 8:30 A M Medical Record Number: [id number] Patient Account Number: [id number] Date of Birth/Sex: Treating RN: 08-21-1945 (77 y.o. [name] Primary Care Wilson Sample: [name] Other Clinician: Referring [name]: Treating [name]/Extender: [name] in Treatment: 15 Visit Information History Since Last Visit Added or deleted any medications: No Patient Arrived: Ambulatory Any new allergies or adverse reactions: No Arrival Time: 08:25 Had a fall or experienced change in No Accompanied By: self activities of daily living that may affect Transfer Assistance: None risk of falls: Patient Identification Verified: Yes Signs or symptoms of abuse/neglect since last visito No Secondary Verification Process Completed: Yes Hospitalized since last visit: No Patient Requires Transmission-Based Precautions: No Implantable device outside of the clinic excluding No Patient Has Alerts: No cellular tissue based products placed in the center since last visit: Has Dressing in Place as Prescribed: Yes Pain Present Now: No Electronic Signature(s) Signed: 06/20/2022 3:54:21 PM By: [name] Entered By: [name] on 06/20/2022 08:25:45 -------------------------------------------------------------------------------- Compression Therapy Details Patient Name: Date of Service: [name], [name] 06/20/2022 8:30 A M Medical Record Number: [id number] Patient Account Number: [id number] Date of Birth/Sex: Treating RN: Oct 28, 1945 (77 y.o. [name] Primary Care Audia Amick: [name] Other Clinician: Referring Esra Frankowski: Treating Maleak Brazzel/Extender: [name] in Treatment: 15 Compression Therapy Performed for Wound Assessment: Wound #1 Right  Achilles Performed By: Clinician Misty Bruin, RN Compression Type: Three Layer Post Procedure Diagnosis Same as Pre-procedure Electronic Signature(s) Signed: 06/20/2022 3:54:21 PM By: [name] Entered By: [name] on 06/20/2022 08:51:39 -------------------------------------------------------------------------------- Encounter Discharge Information Details Patient Name: Date of Service: [name], [name] 06/20/2022 8:30 A M Medical Record Number: [id number] Patient Account Number: [id number] Date of Birth/Sex: Treating RN: 1945-06-26 (77 y.o. [name] Primary Care Arshia Rondon: [name] Other Clinician: Referring Avalyn Molino: Treating [place]/Extender: [name] in Treatment: 15 Encounter Discharge Information Items Post Procedure Vitals Discharge Condition: Stable Temperature (F): 97.7 Ambulatory Status: Ambulatory Pulse (bpm): 67 Discharge Destination: Home Respiratory Rate (breaths/min): 16 Transportation: Private Auto Blood Pressure (mmHg): 154/75 Accompanied By: self [name] ([id number]) [id number] Page 2 of 10 Schedule Follow-up Appointment: Yes Clinical Summary of Care: Patient Declined Electronic Signature(s) Signed: 06/20/2022 3:54:21 PM By: [name] Entered By: [name] on 06/20/2022 09:48:55 -------------------------------------------------------------------------------- Lower Extremity Assessment Details Patient Name: Date of Service: [name], [name] 06/20/2022 8:30 A M Medical Record Number: [id number] Patient Account Number: [id number] Date of Birth/Sex: Treating RN: Nov 11, 1945 (77 y.o. [name] Primary Care Gavon Majano: [name] Other Clinician: Referring Briyonna Omara: Treating Analyah Mcconnon/Extender: [name] in Treatment: 15 Edema Assessment Assessed: [Left: No] [Right: No] Edema: [Left: Ye] [Right: s] Calf Left: Right: Point  of Measurement: 33 cm From Medial Instep 30.5 cm Ankle Left: Right: Point of Measurement: 9 cm From Medial Instep 19 cm Vascular Assessment Pulses: Dorsalis Pedis Palpable: [Right:Yes] Electronic Signature(s) Signed: 06/20/2022 3:54:21 PM By: [name] Entered By: [name] on 06/20/2022 08:35:09 -------------------------------------------------------------------------------- Multi Wound Chart Details Patient Name: Date of Service: [name], [name] 06/20/2022 8:30 A M Medical Record Number: [id number] Patient Account Number: [id number] Date of Birth/Sex: Treating RN: 09-Jan-1946 (77 y.o. F) Primary Care Lynlee Stratton: [name] Other Clinician: Referring Caleigha Zale: Treating Marijo Quizon/Extender: [name] in Treatment: 15 Vital Signs Height(in): 60  Pulse(bpm): 67 Weight(lbs): 126 Blood Pressure(mmHg): 154/75 Body Mass Index(BMI): 24.6 Temperature(F): 97.7 Respiratory Rate(breaths/min): 16 [1:Photos: No Photos Right Achilles Wound Location: Gradually Appeared Wounding Event: Abscess Primary Etiology: Asthma, Hypertension, Received Comorbid History: Radiation 08/30/2021 Date Acquired: 15 Weeks of Treatment:] [2:No Photos Right, Posterior Lower Leg  Shear/Friction Venous Leg Ulcer Asthma, Hypertension, Received Radiation 04/08/2022 9] [4:No Photos Right, Distal, Posterior Lower Leg Gradually Appeared Lesion Asthma, Hypertension, Received Radiation 06/01/2022 2] [name] ([id number]) [1:Open Wound Status: No Wound Recurrence: 1x1.5x0.1 Measurements L x W x D (cm) 1.178 A (cm) : rea 0.118 Volume (cm) : 82.40% % Reduction in Area: 82.30% % Reduction in Volume: Full Thickness Without Exposed Classification: Support Structures Medium  Exudate Amount: Serosanguineous Exudate Type: red, brown Exudate Color: Distinct, outline attached Wound Margin: Large (67-100%) Granulation Amount: Red, Hyper-granulation Granulation Quality: Small (1-33%) Necrotic Amount: Fat  Layer (Subcutaneous  Tissue): Yes Fat Layer (Subcutaneous Tissue): Yes Fat Layer (Subcutaneous Tissue): Yes Exposed Structures: Fascia: No Tendon: No Muscle: No Joint: No Bone: No Small (1-33%) Epithelialization: Excoriation: No Periwound Skin Texture: Induration: No Callus:  No Crepitus: No Rash: No Scarring: No Maceration: No Periwound Skin Moisture: Dry/Scaly: No Rubor: Yes Periwound Skin Color: Atrophie Blanche: No Cyanosis: No Ecchymosis: No Erythema: No Hemosiderin Staining: No Mottled: No Pallor: No No Abnormality  Temperature:] [2:Open No 0.3x0.3x0.1 0.071 0.007 90.00% 90.10% Full Thickness Without Exposed Support Structures Medium Serosanguineous red, brown Distinct, outline attached Small (1-33%) Pink Large (67-100%) Fascia: No Tendon: No Muscle: No Joint: No  Bone: No Large (67-100%) Excoriation: No Induration: No Callus: No Crepitus: No Rash: No Scarring: No Maceration: No Dry/Scaly: No Atrophie Blanche: No Cyanosis: No Ecchymosis: No Erythema: No Hemosiderin Staining: No Mottled: No Pallor: No Rubor: No No  Abnormality] [4:[id number] Page 3 of 10 Open No 0.5x0.2x0.1 0.079 0.008 74.80% 87.30% Full Thickness Without Exposed Support Structures Medium Serosanguineous red, brown Distinct, outline attached Small (1-33%) Pink Large  (67-100%) Fascia: No Tendon: No Muscle: No Joint: No Bone: No Small (1-33%) No Abnormalities Noted No Abnormalities Noted No Abnormalities Noted No Abnormality] Treatment Notes Electronic Signature(s) Signed: 06/20/2022 8:40:11 AM By: Duanne Guess MD FACS Entered By: Duanne Guess on 06/20/2022 08:40:11 -------------------------------------------------------------------------------- Multi-Disciplinary Care Plan Details Patient Name: Date of Service: [name], Ellanor V. 06/20/2022 8:30 A M Medical Record Number: [id number] Patient Account Number: [id number] Date of Birth/Sex: Treating RN: 05/01/1945 (77 y.o. [name] Primary  Care Anitria Andon: [name] Other Clinician: Referring Kennedey Digilio: Treating Toia Micale/Extender: [name] in Treatment: 15 Active Inactive Wound/Skin Impairment Nursing Diagnoses: Knowledge deficit related to ulceration/compromised skin integrity Goals: Patient/caregiver will verbalize understanding of skin care regimen Date Initiated: 03/01/2022 Target Resolution Date: 08/03/2022 Goal Status: Active Interventions: Assess ulceration(s) every visit Treatment Activities: Skin care regimen initiated : 03/01/2022 Notes: KATRINKA, HERBISON ([id number]) [id number] Page 4 of 10 Electronic Signature(s) Signed: 06/20/2022 3:54:21 PM By: [name] Entered By: [name] on 06/20/2022 08:38:27 -------------------------------------------------------------------------------- Pain Assessment Details Patient Name: Date of Service: [name], LISANNE PONCE 06/20/2022 8:30 A M Medical Record Number: [id number] Patient Account Number: [id number] Date of Birth/Sex: Treating RN: 10/06/1945 (77 y.o. [name] Primary Care Kelsy Polack: [name] Other Clinician: Referring Alvaro Aungst: Treating Reather Steller/Extender: [name] in Treatment: 15 Active Problems Location of Pain Severity and Description of Pain Patient Has Paino No Site Locations Rate the pain. Current Pain Level: 0 Pain Management and Medication Current Pain Management: Electronic Signature(s) Signed: 06/20/2022 3:54:21 PM By: Ander Slade,  Tristan Schroeder By: [name] on 06/20/2022 82:95:62 -------------------------------------------------------------------------------- Patient/Caregiver Education Details Patient Name: Date of Service: [name], [name] 4/16/2024andnbsp8:30 A M Medical Record Number: [id number] Patient Account Number: [id number] Date of Birth/Gender: Treating RN: November 12, 1945 (77 y.o. [name] Primary Care Physician: [name]  Other Clinician: Referring Physician: Treating Physician/Extender: [name] in Treatment: 15 Education Assessment Education Provided To: Patient Education Topics Provided Wound/Skin Impairment: Methods: Explain/Verbal Responses: Reinforcements needed, State content correctly NELDA, LUCKEY ([id number]) [id number] Page 5 of 10 Electronic Signature(s) Signed: 06/20/2022 3:54:21 PM By: [name] Entered By: [name] on 06/20/2022 08:38:41 -------------------------------------------------------------------------------- Wound Assessment Details Patient Name: Date of Service: [name], [name] 06/20/2022 8:30 A M Medical Record Number: [id number] Patient Account Number: [id number] Date of Birth/Sex: Treating RN: 1945/07/24 (77 y.o. [name] Primary Care Anvay Tennis: [name] Other Clinician: Referring Kamdyn Colborn: Treating Violia Knopf/Extender: [name] in Treatment: 15 Wound Status Wound Number: 1 Primary Etiology: Abscess Wound Location: Right Achilles Wound Status: Open Wounding Event: Gradually Appeared Comorbid History: Asthma, Hypertension, Received Radiation Date Acquired: 08/30/2021 Weeks Of Treatment: 15 Clustered Wound: No Photos Wound Measurements Length: (cm) 1 Width: (cm) 1.5 Depth: (cm) 0.1 Area: (cm) 1.178 Volume: (cm) 0.118 % Reduction in Area: 82.4% % Reduction in Volume: 82.3% Epithelialization: Small (1-33%) Tunneling: No Undermining: No Wound Description Classification: Full Thickness Without Exposed Support Structures Wound Margin: Distinct, outline attached Exudate Amount: Medium Exudate Type: Serosanguineous Exudate Color: red, brown Foul Odor After Cleansing: No Slough/Fibrino Yes Wound Bed Granulation Amount: Large (67-100%) Exposed Structure Granulation Quality: Red, Hyper-granulation Fascia Exposed: No Necrotic Amount: Small (1-33%) Fat Layer  (Subcutaneous Tissue) Exposed: Yes Necrotic Quality: Adherent Slough Tendon Exposed: No Muscle Exposed: No Joint Exposed: No Bone Exposed: No Periwound Skin Texture Texture Color No Abnormalities Noted: Yes No Abnormalities Noted: No Atrophie Blanche: No Moisture Cyanosis: No No Abnormalities Noted: Yes Ecchymosis: No Erythema: No Hemosiderin Staining: No Mottled: No Pallor: No Rubor: Yes Temperature / Pain DERIN, MATTHES V ([id number]) [id number] Page 6 of 10 Temperature: No Abnormality Treatment Notes Wound #1 (Achilles) Wound Laterality: Right Cleanser Soap and Water Discharge Instruction: May shower and wash wound with dial antibacterial soap and water prior to dressing change. Wound Cleanser Discharge Instruction: Cleanse the wound with wound cleanser prior to applying a clean dressing using gauze sponges, not tissue or cotton balls. Peri-Wound Care Ketoconazole Cream 2% Discharge Instruction: Apply Ketoconazole as directed Zinc Oxide Ointment 30g tube Discharge Instruction: Apply Zinc Oxide to periwound with each dressing change Sween Lotion (Moisturizing lotion) Discharge Instruction: Apply moisturizing lotion as directed Topical Primary Dressing Maxorb Extra Ag+ Alginate Dressing, 2x2 (in/in) Discharge Instruction: Apply to wound bed as instructed Secondary Dressing Zetuvit Plus 4x8 in Discharge Instruction: Apply over primary dressing as directed. Secured With Compression Wrap ThreePress (3 layer compression wrap) Discharge Instruction: Apply three layer compression as directed. Netting,Tubular #5 Compression Stockings Add-Ons Electronic Signature(s) Signed: 06/20/2022 3:54:21 PM By: [name] Entered By: [name] on 06/20/2022 08:40:15 -------------------------------------------------------------------------------- Wound Assessment Details Patient Name: Date of Service: [name], [name] 06/20/2022 8:30 A  M Medical Record Number: [id number] Patient Account Number: [id number] Date of Birth/Sex: Treating RN: 1945-11-28 (77 y.o. [name] Primary Care Christene Pounds: [name] Other Clinician: Referring Cylie Dor: Treating Myka Lukins/Extender: Elsie Saas Weeks in Treatment: 15 Wound Status Wound Number: 2 Primary Venous Leg Ulcer Etiology: Wound Location: Right, Posterior Lower Leg Wound Open Wounding Event: Shear/Friction Status: Date Acquired:  04/08/2022 Notes: Pt states she started feeling a little sensation under her wrap Weeks Of Treatment: 9 and now has a new place Clustered Wound: No Comorbid Asthma, Hypertension, Received Radiation History: Photos CARLYNE, KEEHAN ([id number]) [id number] Page 7 of 10 Wound Measurements Length: (cm) 0.3 Width: (cm) 0.3 Depth: (cm) 0.1 Area: (cm) 0.071 Volume: (cm) 0.007 % Reduction in Area: 90% % Reduction in Volume: 90.1% Epithelialization: Large (67-100%) Tunneling: No Undermining: No Wound Description Classification: Full Thickness Without Exposed Support Structures Wound Margin: Distinct, outline attached Exudate Amount: Medium Exudate Type: Serosanguineous Exudate Color: red, brown Foul Odor After Cleansing: No Slough/Fibrino Yes Wound Bed Granulation Amount: Small (1-33%) Exposed Structure Granulation Quality: Pink Fascia Exposed: No Necrotic Amount: Large (67-100%) Fat Layer (Subcutaneous Tissue) Exposed: Yes Necrotic Quality: Adherent Slough Tendon Exposed: No Muscle Exposed: No Joint Exposed: No Bone Exposed: No Periwound Skin Texture Texture Color No Abnormalities Noted: Yes No Abnormalities Noted: No Atrophie Blanche: No Moisture Cyanosis: No No Abnormalities Noted: Yes Ecchymosis: No Erythema: No Hemosiderin Staining: No Mottled: No Pallor: No Rubor: No Temperature / Pain Temperature: No Abnormality Treatment Notes Wound #2 (Lower Leg) Wound Laterality:  Right, Posterior Cleanser Soap and Water Discharge Instruction: May shower and wash wound with dial antibacterial soap and water prior to dressing change. Wound Cleanser Discharge Instruction: Cleanse the wound with wound cleanser prior to applying a clean dressing using gauze sponges, not tissue or cotton balls. Peri-Wound Care Ketoconazole Cream 2% Discharge Instruction: Apply Ketoconazole as directed Zinc Oxide Ointment 30g tube Discharge Instruction: Apply Zinc Oxide to periwound with each dressing change Sween Lotion (Moisturizing lotion) Discharge Instruction: Apply moisturizing lotion as directed Topical Primary Dressing Maxorb Extra Ag+ Alginate Dressing, 2x2 (in/in) Discharge Instruction: Apply to wound bed as instructed SHAQUITA, FORT ([id number]) [id number] Page 8 of 10 Secondary Dressing Zetuvit Plus 4x8 in Discharge Instruction: Apply over primary dressing as directed. Secured With Compression Wrap ThreePress (3 layer compression wrap) Discharge Instruction: Apply three layer compression as directed. Netting,Tubular #5 Compression Stockings Add-Ons Electronic Signature(s) Signed: 06/20/2022 3:54:21 PM By: [name] Entered By: [name] on 06/20/2022 08:40:47 -------------------------------------------------------------------------------- Wound Assessment Details Patient Name: Date of Service: [name], [name] 06/20/2022 8:30 A M Medical Record Number: [id number] Patient Account Number: [id number] Date of Birth/Sex: Treating RN: Aug 07, 1945 (77 y.o. [name] Primary Care Panagiotis Oelkers: [name] Other Clinician: Referring Kayceon Oki: Treating Dakiya Puopolo/Extender: Elsie Saas Weeks in Treatment: 15 Wound Status Wound Number: 4 Primary Etiology: Lesion Wound Location: Right, Distal, Posterior Lower Leg Wound Status: Open Wounding Event: Gradually Appeared Comorbid History: Asthma, Hypertension, Received  Radiation Date Acquired: 06/01/2022 Weeks Of Treatment: 2 Clustered Wound: No Photos Wound Measurements Length: (cm) 0.5 Width: (cm) 0.2 Depth: (cm) 0.1 Area: (cm) 0.079 Volume: (cm) 0.008 % Reduction in Area: 74.8% % Reduction in Volume: 87.3% Epithelialization: Small (1-33%) Tunneling: No Undermining: No Wound Description Classification: Full Thickness Without Exposed Support Structures Wound Margin: Distinct, outline attached Exudate Amount: Medium Exudate Type: Serosanguineous Exudate Color: red, brown Foul Odor After Cleansing: No Slough/Fibrino Yes Wound Bed Granulation Amount: Small (1-33%) Exposed Structure Granulation Quality: Pink Fascia Exposed: No Necrotic Amount: Large (67-100%) Fat Layer (Subcutaneous Tissue) Exposed: Yes Necrotic Quality: Adherent Slough Tendon Exposed: No Muscle Exposed: No SUELLEN, DUROCHER V ([id number]) [id number] Page 9 of 10 Joint Exposed: No Bone Exposed: No Periwound Skin Texture Texture Color No Abnormalities Noted: Yes No Abnormalities Noted: Yes Moisture Temperature / Pain No Abnormalities Noted: Yes Temperature: No Abnormality Treatment Notes Wound #4 (  Lower Leg) Wound Laterality: Right, Posterior, Distal Cleanser Soap and Water Discharge Instruction: May shower and wash wound with dial antibacterial soap and water prior to dressing change. Wound Cleanser Discharge Instruction: Cleanse the wound with wound cleanser prior to applying a clean dressing using gauze sponges, not tissue or cotton balls. Peri-Wound Care Ketoconazole Cream 2% Discharge Instruction: Apply Ketoconazole as directed Zinc Oxide Ointment 30g tube Discharge Instruction: Apply Zinc Oxide to periwound with each dressing change Sween Lotion (Moisturizing lotion) Discharge Instruction: Apply moisturizing lotion as directed Topical Primary Dressing Maxorb Extra Ag+ Alginate Dressing, 2x2 (in/in) Discharge Instruction: Apply to  wound bed as instructed Secondary Dressing Zetuvit Plus 4x8 in Discharge Instruction: Apply over primary dressing as directed. Secured With Compression Wrap ThreePress (3 layer compression wrap) Discharge Instruction: Apply three layer compression as directed. Netting,Tubular #5 Compression Stockings Add-Ons Electronic Signature(s) Signed: 06/20/2022 3:54:21 PM By: [name] Entered By: [name] on 06/20/2022 08:41:08 -------------------------------------------------------------------------------- Vitals Details Patient Name: Date of Service: [name], [name] 06/20/2022 8:30 A M Medical Record Number: [id number] Patient Account Number: [id number] Date of Birth/Sex: Treating RN: [birth year] (77 y.o. [name] Primary Care Vanette Noguchi: [name] Other Clinician: Referring Lucca Greggs: Treating Nollie Shiflett/Extender: [name] in Treatment: 15 Vital Signs Time Taken: 08:30 Temperature (F): 97.7 Height (in): 60 Pulse (bpm): 67 Weight (lbs): 126 Respiratory Rate (breaths/min): 16 Body Mass Index (BMI): 24.6 Blood Pressure (mmHg): 154/75 Reference Range: 80 - 120 mg / dl Electronic Signature(s) RADIAH, LUBINSKI V ([id number]) [id number] Page 10 of 10 Signed: 06/20/2022 3:54:21 PM By: [name] Entered By: [name] on 06/20/2022 08:30:22

## 2022-06-21 ENCOUNTER — Encounter: Payer: Self-pay | Admitting: Internal Medicine

## 2022-06-22 NOTE — Progress Notes (Signed)
KESHANA, KLEMZ ([id number]) [id number] Page 1 of 1 Visit Report for 05/22/2022 SuperBill Details Patient Name: Date of Service: BRECK, HOLLINGER 05/22/2022 Medical Record Number: [id number] Patient Account Number: [id number] Date of Birth/Sex: Treating RN: 04/23/1945 (77 y.o. Gevena Mart Primary Care Provider: Willow Ora Other Clinician: Referring Provider: Treating Provider/Extender: Susette Racer Weeks in Treatment: 11 Diagnosis Coding ICD-10 Codes Code Description [phone] Non-pressure chronic ulcer of other part of right lower leg with fat layer exposed L97.818 Non-pressure chronic ulcer of other part of right lower leg with other specified severity I10 Essential (primary) hypertension Z85.831 Personal history of malignant neoplasm of soft tissue Z92.3 Personal history of irradiation Facility Procedures CPT4 Code Description Modifier Quantity [id number] (Facility Use Only) [phone] - APPLY MULTLAY COMPRS LWR RT LEG 1 Electronic Signature(s) Signed: 05/22/2022 12:10:44 PM By: Geralyn Corwin DO Signed: 06/22/2022 1:55:51 PM By: Brenton Grills Entered By: Brenton Grills on 05/22/2022 08:10:13

## 2022-06-22 NOTE — Progress Notes (Signed)
[name], [name] ([id number]) [id number] Page 1 of 4 Visit Report for 05/22/2022 Arrival Information Details Patient Name: Date of Service: [name], [name] 05/22/2022 7:30 A M Medical Record Number: [id number] Patient Account Number: [id number] Date of Birth/Sex: Treating RN: [birth year] (77 y.o. Gevena Mart Primary Care Avayah Raffety: Willow Ora Other Clinician: Referring Jarold Macomber: Treating Aariyah Sampey/Extender: Darden Palmer in Treatment: 11 Visit Information History Since Last Visit All ordered tests and consults were completed: Yes Patient Arrived: Ambulatory Added or deleted any medications: No Arrival Time: 07:43 Any new allergies or adverse reactions: No Accompanied By: self Had a fall or experienced change in No Transfer Assistance: None activities of daily living that may affect Patient Identification Verified: Yes risk of falls: Secondary Verification Process Completed: Yes Signs or symptoms of abuse/neglect since last visito No Patient Requires Transmission-Based Precautions: No Hospitalized since last visit: No Patient Has Alerts: No Implantable device outside of the clinic excluding No cellular tissue based products placed in the center since last visit: Pain Present Now: Yes Electronic Signature(s) Signed: 05/22/2022 5:06:20 PM By: Tommie Ard RN Entered By: Tommie Ard on 05/22/2022 10:20:16 -------------------------------------------------------------------------------- Compression Therapy Details Patient Name: Date of Service: [name], Celicia [name]. 05/22/2022 7:30 A M Medical Record Number: [id number] Patient Account Number: [id number] Date of Birth/Sex: Treating RN: 01/24/1946 (77 y.o. Gevena Mart Primary Care Manami Tutor: Willow Ora Other Clinician: Referring Geanna Divirgilio: Treating Caydan Mctavish/Extender: Susette Racer Weeks in Treatment: 11 Compression Therapy Performed for Wound Assessment: Wound #1 Right  Achilles Performed By: Clinician Brenton Grills, RN Compression Type: Three Layer Electronic Signature(s) Signed: 06/22/2022 1:55:51 PM By: Brenton Grills Entered By: Brenton Grills on 05/22/2022 08:08:18 -------------------------------------------------------------------------------- Compression Therapy Details Patient Name: Date of Service: [name], Hiilei [name]. 05/22/2022 7:30 A M Medical Record Number: [id number] Patient Account Number: [id number] Date of Birth/Sex: Treating RN: Mar 17, 1945 (77 y.o. Gevena Mart Primary Care Misbah Hornaday: Willow Ora Other Clinician: Referring Xena Propst: Treating Herndon Grill/Extender: Susette Racer Weeks in Treatment: 11 Compression Therapy Performed for Wound Assessment: Wound #2 Right,Posterior Lower Leg Performed By: Leighton Parody, RN Compression Type: Three Layer Electronic Signature(s) Signed: 06/22/2022 1:55:51 PM By: Brenton Grills Entered By: Brenton Grills on 05/22/2022 08:08:18 [name] ([id number]) [id number] Page 2 of 4 -------------------------------------------------------------------------------- Encounter Discharge Information Details Patient Name: Date of Service: MO ROGENA, DEUPREE 05/22/2022 7:30 A M Medical Record Number: [id number] Patient Account Number: [id number] Date of Birth/Sex: Treating RN: 09-02-1945 (77 y.o. Gevena Mart Primary Care Zyah Gomm: Willow Ora Other Clinician: Referring Danica Camarena: Treating Kaleiyah Polsky/Extender: Darden Palmer in Treatment: 11 Encounter Discharge Information Items Discharge Condition: Stable Ambulatory Status: Ambulatory Discharge Destination: Home Transportation: Private Auto Accompanied By: self Schedule Follow-up Appointment: Yes Clinical Summary of Care: Patient Declined Electronic Signature(s) Signed: 06/22/2022 1:55:51 PM By: Brenton Grills Entered By: Brenton Grills on 05/22/2022  08:09:32 -------------------------------------------------------------------------------- Patient/Caregiver Education Details Patient Name: Date of Service: MO Jacky Kindle 3/18/2024andnbsp7:30 A M Medical Record Number: [id number] Patient Account Number: [id number] Date of Birth/Gender: Treating RN: 15-Mar-1945 (77 y.o. Gevena Mart Primary Care Physician: Willow Ora Other Clinician: Referring Physician: Treating Physician/Extender: Darden Palmer in Treatment: 11 Education Assessment Education Provided To: Patient Education Topics Provided Wound/Skin Impairment: Methods: Explain/Verbal Responses: Reinforcements needed, State content correctly Electronic Signature(s) Signed: 06/22/2022 1:55:51 PM By: Brenton Grills Entered By: Brenton Grills on 05/22/2022 08:09:04 -------------------------------------------------------------------------------- Wound Assessment Details Patient Name: Date of Service: [name], Larie [name]. 05/22/2022  7:30 A M Medical Record Number: [id number] Patient Account Number: [id number] Date of Birth/Sex: Treating RN: 1945-08-15 (77 y.o. Gevena Mart Primary Care Shivaan Tierno: Willow Ora Other Clinician: Referring Weslie Rasmus: Treating Quorra Rosene/Extender: Susette Racer Weeks in Treatment: 11 Wound Status Wound Number: 1 Primary Etiology: Abscess Wound Location: Right Achilles Wound Status: Open Wounding Event: Gradually Appeared Comorbid History: Asthma, Hypertension, Received Radiation Date Acquired: 08/30/2021 Weeks Of Treatment: 11 Clustered Wound: No Wound Measurements [name], [name] ([id number]) Length: (cm) 1 Width: (cm) 1 Depth: (cm) 0 Area: (cm) Volume: (cm) [id number] Page 3 of 4 .5 % Reduction in Area: 73.5% .5 % Reduction in Volume: 73.5% .1 Epithelialization: Small (1-33%) 1.767 Tunneling: No 0.177 Undermining: No Wound Description Classification: Full Thickness Without Exposed  Support Structures Wound Margin: Distinct, outline attached Exudate Amount: Medium Exudate Type: Serosanguineous Exudate Color: red, brown Foul Odor After Cleansing: No Slough/Fibrino Yes Wound Bed Granulation Amount: Small (1-33%) Exposed Structure Granulation Quality: Red Fascia Exposed: No Necrotic Amount: Large (67-100%) Fat Layer (Subcutaneous Tissue) Exposed: Yes Tendon Exposed: No Muscle Exposed: No Joint Exposed: No Bone Exposed: No Periwound Skin Texture Texture Color No Abnormalities Noted: Yes No Abnormalities Noted: No Atrophie Blanche: No Moisture Cyanosis: No No Abnormalities Noted: Yes Ecchymosis: No Erythema: No Hemosiderin Staining: No Mottled: No Pallor: No Rubor: No Temperature / Pain Temperature: No Abnormality Electronic Signature(s) Signed: 06/22/2022 1:55:51 PM By: Brenton Grills Entered By: Brenton Grills on 05/22/2022 07:51:53 -------------------------------------------------------------------------------- Wound Assessment Details Patient Name: Date of Service: [name], [name] 05/22/2022 7:30 A M Medical Record Number: [id number] Patient Account Number: [id number] Date of Birth/Sex: Treating RN: 1945/09/06 (77 y.o. Gevena Mart Primary Care Magaly Pollina: Willow Ora Other Clinician: Referring Jontrell Bushong: Treating Laelia Angelo/Extender: Susette Racer Weeks in Treatment: 11 Wound Status Wound Number: 2 Primary Venous Leg Ulcer Etiology: Wound Location: Right, Posterior Lower Leg Wound Open Wounding Event: Shear/Friction Status: Date Acquired: 04/08/2022 Notes: Pt states she started feeling a little sensation under her wrap Weeks Of Treatment: 5 and now has a new place Clustered Wound: No Comorbid Asthma, Hypertension, Received Radiation History: Wound Measurements Length: (cm) 1 Width: (cm) 2 Depth: (cm) 0.1 Area: (cm) 1.571 Volume: (cm) 0.157 % Reduction in Area: -122.2% % Reduction in Volume: -121.1% Epithelialization:  Medium (34-66%) Tunneling: No Undermining: No Wound Description Classification: Full Thickness Without Exposed Support Structures Wound Margin: Distinct, outline attached Exudate Amount: Medium [name], [name] ([id number]) Exudate Type: Serosanguineous Exudate Color: red, brown Foul Odor After Cleansing: No Slough/Fibrino Yes [phone].pdf Page 4 of 4 Wound Bed Granulation Amount: Large (67-100%) Exposed Structure Granulation Quality: Pink Fascia Exposed: No Necrotic Amount: Small (1-33%) Fat Layer (Subcutaneous Tissue) Exposed: Yes Necrotic Quality: Adherent Slough Tendon Exposed: No Muscle Exposed: No Joint Exposed: No Bone Exposed: No Periwound Skin Texture Texture Color No Abnormalities Noted: Yes No Abnormalities Noted: No Atrophie Blanche: No Moisture Cyanosis: No No Abnormalities Noted: Yes Ecchymosis: No Erythema: No Hemosiderin Staining: No Mottled: No Pallor: No Rubor: No Temperature / Pain Temperature: No Abnormality Electronic Signature(s) Signed: 06/22/2022 1:55:51 PM By: Brenton Grills Entered By: Brenton Grills on 05/22/2022 07:52:59 -------------------------------------------------------------------------------- Vitals Details Patient Name: Date of Service: [name], [name] 05/22/2022 7:30 A M Medical Record Number: [id number] Patient Account Number: [id number] Date of Birth/Sex: Treating RN: 14-Apr-1945 (77 y.o. Gevena Mart Primary Care Marrell Dicaprio: Willow Ora Other Clinician: Referring Santosha Jividen: Treating Gayleen Sholtz/Extender: Susette Racer Weeks in Treatment: 11 Vital Signs Time Taken: 07:44 Temperature (F): 97.8 Height (in): 60 Pulse (  bpm): 65 Weight (lbs): 126 Respiratory Rate (breaths/min): 18 Body Mass Index (BMI): 24.6 Blood Pressure (mmHg): 174/74 Reference Range: 80 - 120 mg / dl Electronic Signature(s) Signed: 06/22/2022 1:55:51 PM By: Brenton Grills Entered By: Brenton Grills on 05/22/2022  07:50:37

## 2022-06-27 ENCOUNTER — Encounter (HOSPITAL_BASED_OUTPATIENT_CLINIC_OR_DEPARTMENT_OTHER): Payer: Medicare HMO | Admitting: General Surgery

## 2022-06-27 DIAGNOSIS — L97812 Non-pressure chronic ulcer of other part of right lower leg with fat layer exposed: Secondary | ICD-10-CM | POA: Diagnosis not present

## 2022-06-27 DIAGNOSIS — J45909 Unspecified asthma, uncomplicated: Secondary | ICD-10-CM | POA: Diagnosis not present

## 2022-06-27 DIAGNOSIS — E785 Hyperlipidemia, unspecified: Secondary | ICD-10-CM | POA: Diagnosis not present

## 2022-06-27 DIAGNOSIS — I10 Essential (primary) hypertension: Secondary | ICD-10-CM | POA: Diagnosis not present

## 2022-06-27 DIAGNOSIS — L988 Other specified disorders of the skin and subcutaneous tissue: Secondary | ICD-10-CM | POA: Diagnosis not present

## 2022-06-27 DIAGNOSIS — L97818 Non-pressure chronic ulcer of other part of right lower leg with other specified severity: Secondary | ICD-10-CM | POA: Diagnosis not present

## 2022-06-27 DIAGNOSIS — L02415 Cutaneous abscess of right lower limb: Secondary | ICD-10-CM | POA: Diagnosis not present

## 2022-06-27 DIAGNOSIS — E039 Hypothyroidism, unspecified: Secondary | ICD-10-CM | POA: Diagnosis not present

## 2022-06-28 NOTE — Progress Notes (Signed)
CORNELL, GABER ([id number]) [id number] Page 1 of 9 Visit Report for 06/27/2022 Arrival Information Details Patient Name: Date of Service: [name], [name] 06/27/2022 1:30 PM Medical Record Number: [id number] Patient Account Number: [id number] Date of Birth/Sex: Treating RN: 02-07-1946 (77 y.o. F) Primary Care Michala Deblanc: Willow Ora Other Clinician: Referring Klayten Jolliff: Treating Lycia Sachdeva/Extender: Cresenciano Genre in Treatment: 16 Visit Information History Since Last Visit All ordered tests and consults were completed: No Patient Arrived: Ambulatory Added or deleted any medications: No Arrival Time: 13:30 Any new allergies or adverse reactions: No Accompanied By: self Had a fall or experienced change in No Transfer Assistance: None activities of daily living that may affect Patient Identification Verified: Yes risk of falls: Secondary Verification Process Completed: Yes Signs or symptoms of abuse/neglect since last visito No Patient Requires Transmission-Based Precautions: No Hospitalized since last visit: No Patient Has Alerts: No Implantable device outside of the clinic excluding No cellular tissue based products placed in the center since last visit: Pain Present Now: No Electronic Signature(s) Signed: 06/27/2022 3:35:12 PM By: Dayton Scrape Entered By: Dayton Scrape on 06/27/2022 13:31:06 -------------------------------------------------------------------------------- Compression Therapy Details Patient Name: Date of Service: Misty, [name] 06/27/2022 1:30 PM Medical Record Number: [id number] Patient Account Number: [id number] Date of Birth/Sex: Treating RN: April 23, 1945 (77 y.o. [name] Primary Care Gabriellah Rabel: Willow Ora Other Clinician: Referring Ranson Belluomini: Treating Kaydin Karbowski/Extender: Cresenciano Genre in Treatment: 16 Compression Therapy Performed for Wound Assessment: Wound #1 Right Achilles Performed By:  Clinician Samuella Bruin, RN Compression Type: Three Layer Post Procedure Diagnosis Same as Pre-procedure Electronic Signature(s) Signed: 06/27/2022 4:08:09 PM By: Samuella Bruin Entered By: Samuella Bruin on 06/27/2022 13:50:55 -------------------------------------------------------------------------------- Encounter Discharge Information Details Patient Name: Date of Service: [name], [name] 06/27/2022 1:30 PM Medical Record Number: [id number] Patient Account Number: [id number] Date of Birth/Sex: Treating RN: [birth year] (77 y.o. [name] Primary Care Latrelle Fuston: Willow Ora Other Clinician: Referring [name] Delahoz: Treating Abdalla Naramore/Extender: Cresenciano Genre in Treatment: 16 Encounter Discharge Information Items Post Procedure Vitals Discharge Condition: Stable Temperature (F): 97.9 Ambulatory Status: Ambulatory Pulse (bpm): 58 Discharge Destination: Home Respiratory Rate (breaths/min): 18 Transportation: Private Auto Blood Pressure (mmHg): 166/95 Accompanied By: self [name] ([id number]) [id number] Page 2 of 9 Schedule Follow-up Appointment: Yes Clinical Summary of Care: Patient Declined Electronic Signature(s) Signed: 06/27/2022 4:08:09 PM By: Samuella Bruin Entered By: Samuella Bruin on 06/27/2022 14:10:37 -------------------------------------------------------------------------------- Lower Extremity Assessment Details Patient Name: Date of Service: Misty, [name] 06/27/2022 1:30 PM Medical Record Number: [id number] Patient Account Number: [id number] Date of Birth/Sex: Treating RN: Nov 05, 1945 (77 y.o. [name] Primary Care Jayzon Taras: Willow Ora Other Clinician: Referring Tahlia Deamer: Treating Keiyon Plack/Extender: Cresenciano Genre in Treatment: 16 Edema Assessment Assessed: [Left: No] [Right: No] Edema: [Left: Ye] [Right: s] Calf Left: Right: Point of Measurement: 33 cm From  Medial Instep 30.8 cm Ankle Left: Right: Point of Measurement: 9 cm From Medial Instep 18.5 cm Vascular Assessment Pulses: Dorsalis Pedis Palpable: [Right:Yes] Electronic Signature(s) Signed: 06/27/2022 4:08:09 PM By: Samuella Bruin Entered By: Samuella Bruin on 06/27/2022 13:38:51 -------------------------------------------------------------------------------- Multi Wound Chart Details Patient Name: Date of Service: [name], [name] 06/27/2022 1:30 PM Medical Record Number: [id number] Patient Account Number: [id number] Date of Birth/Sex: Treating RN: 01/14/1946 (77 y.o. F) Primary Care Wylan Gentzler: Willow Ora Other Clinician: Referring Shontell Prosser: Treating Deairra Halleck/Extender: Cresenciano Genre in Treatment: 16 Vital Signs Height(in): 60 Pulse(bpm): 58 Weight(lbs): 126 Blood Pressure(mmHg):  166/95 Body Mass Index(BMI): 24.6 Temperature(F): 97.9 Respiratory Rate(breaths/min): 18 [1:Photos:] [[id number] Page 3 of 9] Right Achilles Right, Posterior Lower Leg Right, Distal, Posterior Lower Leg Wound Location: Gradually Appeared Shear/Friction Gradually Appeared Wounding Event: Abscess Venous Leg Ulcer Lesion Primary Etiology: Asthma, Hypertension, Received Asthma, Hypertension, Received Asthma, Hypertension, Received Comorbid History: Radiation Radiation Radiation 08/30/2021 04/08/2022 06/01/2022 Date Acquired: 16 10 3  Weeks of Treatment: Open Open Open Wound Status: No No No Wound Recurrence: 1x1.5x0.1 0x0x0 0.4x0.1x0.1 Measurements L x W x D (cm) 1.178 0 0.031 A (cm) : rea 0.118 0 0.003 Volume (cm) : 82.40% 100.00% 90.10% % Reduction in A rea: 82.30% 100.00% 95.20% % Reduction in Volume: Full Thickness Without Exposed Full Thickness Without Exposed Full Thickness Without Exposed Classification: Support Structures Support Structures Support Structures Medium None Present Medium Exudate A mount: Serosanguineous N/A  Serosanguineous Exudate Type: red, brown N/A red, brown Exudate Color: Distinct, outline attached Distinct, outline attached Distinct, outline attached Wound Margin: Large (67-100%) None Present (0%) Small (1-33%) Granulation A mount: Red, Hyper-granulation N/A Pink Granulation Quality: Small (1-33%) None Present (0%) Large (67-100%) Necrotic A mount: Fat Layer (Subcutaneous Tissue): Yes Fascia: No Fat Layer (Subcutaneous Tissue): Yes Exposed Structures: Fascia: No Fat Layer (Subcutaneous Tissue): No Fascia: No Tendon: No Tendon: No Tendon: No Muscle: No Muscle: No Muscle: No Joint: No Joint: No Joint: No Bone: No Bone: No Bone: No Medium (34-66%) Large (67-100%) Small (1-33%) Epithelialization: Debridement - Selective/Open Wound N/A Debridement - Selective/Open Wound Debridement: Pre-procedure Verification/Time Out 13:49 N/A 13:49 Taken: Lidocaine 4% Topical Solution N/A Lidocaine 4% Topical Solution Pain Control: Slough N/A Slough Tissue Debrided: Non-Viable Tissue N/A Non-Viable Tissue Level: 1.18 N/A 0.03 Debridement A (sq cm): rea Curette N/A Curette Instrument: Minimum N/A Minimum Bleeding: Pressure N/A Pressure Hemostasis A chieved: Procedure was tolerated well N/A Procedure was tolerated well Debridement Treatment Response: 1x1.5x0.1 N/A 0.4x0.1x0.1 Post Debridement Measurements L x W x D (cm) 0.118 N/A 0.003 Post Debridement Volume: (cm) Excoriation: No Excoriation: No No Abnormalities Noted Periwound Skin Texture: Induration: No Induration: No Callus: No Callus: No Crepitus: No Crepitus: No Rash: No Rash: No Scarring: No Scarring: No Maceration: No Maceration: No No Abnormalities Noted Periwound Skin Moisture: Dry/Scaly: No Dry/Scaly: No Rubor: Yes Atrophie Blanche: No No Abnormalities Noted Periwound Skin Color: Atrophie Blanche: No Cyanosis: No Cyanosis: No Ecchymosis: No Ecchymosis: No Erythema: No Erythema:  No Hemosiderin Staining: No Hemosiderin Staining: No Mottled: No Mottled: No Pallor: No Pallor: No Rubor: No No Abnormality No Abnormality No Abnormality Temperature: Compression Therapy N/A Debridement Procedures Performed: Debridement Treatment Notes Electronic Signature(s) Signed: 06/27/2022 2:06:53 PM By: Duanne Guess MD FACS Entered By: Duanne Guess on 06/27/2022 14:06:53 -------------------------------------------------------------------------------- Multi-Disciplinary Care Plan Details Patient Name: Date of Service: [name], Mikaylah V. 06/27/2022 1:30 PM Medical Record Number: [id number] Patient Account Number: [id number] Date of Birth/Sex: Treating RN: 30-Nov-1945 (77 y.o. [name] Primary Care Jatasia Gundrum: Willow Ora Other Clinician: Referring Panhia Karl: Treating Zawadi Aplin/Extender: Cresenciano Genre in Treatment: [address], [place] V ([id number]) [id number] Page 4 of 9 Active Inactive Wound/Skin Impairment Nursing Diagnoses: Knowledge deficit related to ulceration/compromised skin integrity Goals: Patient/caregiver will verbalize understanding of skin care regimen Date Initiated: 03/01/2022 Target Resolution Date: 08/03/2022 Goal Status: Active Interventions: Assess ulceration(s) every visit Treatment Activities: Skin care regimen initiated : 03/01/2022 Notes: Electronic Signature(s) Signed: 06/27/2022 4:08:09 PM By: Samuella Bruin Entered By: Samuella Bruin on 06/27/2022 13:37:05 -------------------------------------------------------------------------------- Pain Assessment Details Patient Name: Date of Service: Misty [name], Brettney V.  06/27/2022 1:30 PM Medical Record Number: [id number] Patient Account Number: [id number] Date of Birth/Sex: Treating RN: 23-May-1945 (77 y.o. F) Primary Care Asma Boldon: Willow Ora Other Clinician: Referring Lamona Eimer: Treating Dilana Mcphie/Extender: Cresenciano Genre in  Treatment: 16 Active Problems Location of Pain Severity and Description of Pain Patient Has Paino No Site Locations Pain Management and Medication Current Pain Management: Electronic Signature(s) Signed: 06/27/2022 3:35:12 PM By: Dayton Scrape Entered By: Dayton Scrape on 06/27/2022 13:31:35 Patient/Caregiver Education Details -------------------------------------------------------------------------------- [name] ([id number]) [id number] Page 5 of 9 Patient Name: Date of Service: Misty ZOII, FLORER 4/23/2024andnbsp1:30 PM Medical Record Number: [id number] Patient Account Number: [id number] Date of Birth/Gender: Treating RN: Jun 07, 1945 (77 y.o. [name] Primary Care Physician: Willow Ora Other Clinician: Referring Physician: Treating Physician/Extender: Cresenciano Genre in Treatment: 16 Education Assessment Education Provided To: Patient Education Topics Provided Wound/Skin Impairment: Methods: Explain/Verbal Responses: Reinforcements needed, State content correctly Electronic Signature(s) Signed: 06/27/2022 4:08:09 PM By: Samuella Bruin Entered By: Samuella Bruin on 06/27/2022 13:37:23 -------------------------------------------------------------------------------- Wound Assessment Details Patient Name: Date of Service: [name], SHAYLENE PAGANELLI 06/27/2022 1:30 PM Medical Record Number: [id number] Patient Account Number: [id number] Date of Birth/Sex: Treating RN: Dec 28, 1945 (77 y.o. [name] Primary Care Lochlann Mastrangelo: Willow Ora Other Clinician: Referring Nilson Tabora: Treating Yocheved Depner/Extender: Cresenciano Genre in Treatment: 16 Wound Status Wound Number: 1 Primary Etiology: Abscess Wound Location: Right Achilles Wound Status: Open Wounding Event: Gradually Appeared Comorbid History: Asthma, Hypertension, Received Radiation Date Acquired: 08/30/2021 Weeks Of Treatment: 16 Clustered Wound:  No Photos Wound Measurements Length: (cm) 1 Width: (cm) 1.5 Depth: (cm) 0.1 Area: (cm) 1.178 Volume: (cm) 0.118 % Reduction in Area: 82.4% % Reduction in Volume: 82.3% Epithelialization: Medium (34-66%) Tunneling: No Undermining: No Wound Description Classification: Full Thickness Without Exposed Support Structures Wound Margin: Distinct, outline attached Exudate Amount: Medium Exudate Type: Serosanguineous Exudate Color: red, brown Foul Odor After Cleansing: No Slough/Fibrino Yes Wound Bed TSION, INGHRAM V ([id number]) [id number].pdf Page 6 of 9 Granulation Amount: Large (67-100%) Exposed Structure Granulation Quality: Red, Hyper-granulation Fascia Exposed: No Necrotic Amount: Small (1-33%) Fat Layer (Subcutaneous Tissue) Exposed: Yes Necrotic Quality: Adherent Slough Tendon Exposed: No Muscle Exposed: No Joint Exposed: No Bone Exposed: No Periwound Skin Texture Texture Color No Abnormalities Noted: Yes No Abnormalities Noted: No Atrophie Blanche: No Moisture Cyanosis: No No Abnormalities Noted: Yes Ecchymosis: No Erythema: No Hemosiderin Staining: No Mottled: No Pallor: No Rubor: Yes Temperature / Pain Temperature: No Abnormality Treatment Notes Wound #1 (Achilles) Wound Laterality: Right Cleanser Soap and Water Discharge Instruction: May shower and wash wound with dial antibacterial soap and water prior to dressing change. Wound Cleanser Discharge Instruction: Cleanse the wound with wound cleanser prior to applying a clean dressing using gauze sponges, not tissue or cotton balls. Peri-Wound Care Ketoconazole Cream 2% Discharge Instruction: Apply Ketoconazole as directed Zinc Oxide Ointment 30g tube Discharge Instruction: Apply Zinc Oxide to periwound with each dressing change Sween Lotion (Moisturizing lotion) Discharge Instruction: Apply moisturizing lotion as directed Topical Primary Dressing Maxorb Extra Ag+ Alginate Dressing,  2x2 (in/in) Discharge Instruction: Apply to wound bed as instructed Secondary Dressing Zetuvit Plus 4x8 in Discharge Instruction: Apply over primary dressing as directed. Secured With Compression Wrap ThreePress (3 layer compression wrap) Discharge Instruction: Apply three layer compression as directed. Netting,Tubular #5 Compression Stockings Add-Ons Electronic Signature(s) Signed: 06/27/2022 4:08:09 PM By: Samuella Bruin Entered By: Samuella Bruin on 06/27/2022 13:43:07 -------------------------------------------------------------------------------- Wound Assessment Details Patient Name: Date of Service: Misty [name],  Maudean V. 06/27/2022 1:30 PM Medical Record Number: [id number] Patient Account Number: [id number] Date of Birth/Sex: Treating RN: [birth year] (77 y.o. [name] Primary Care Cheng Dec: Willow Ora Other Clinician: JAZAE, GANDOLFI ([id number]) [id number] Page 7 of 9 Referring Myrian Botello: Treating Hiyab Nhem/Extender: Cresenciano Genre in Treatment: 16 Wound Status Wound Number: 2 Primary Venous Leg Ulcer Etiology: Wound Location: Right, Posterior Lower Leg Wound Open Wounding Event: Shear/Friction Status: Date Acquired: 04/08/2022 Notes: Pt states she started feeling a little sensation under her wrap Weeks Of Treatment: 10 and now has a new place Clustered Wound: No Comorbid Asthma, Hypertension, Received Radiation History: Photos Wound Measurements Length: (cm) Width: (cm) Depth: (cm) Area: (cm) Volume: (cm) 0 % Reduction in Area: 100% 0 % Reduction in Volume: 100% 0 Epithelialization: Large (67-100%) 0 Tunneling: No 0 Undermining: No Wound Description Classification: Full Thickness Without Exposed Support Structures Wound Margin: Distinct, outline attached Exudate Amount: None Present Foul Odor After Cleansing: No Slough/Fibrino No Wound Bed Granulation Amount: None Present (0%) Exposed  Structure Necrotic Amount: None Present (0%) Fascia Exposed: No Fat Layer (Subcutaneous Tissue) Exposed: No Tendon Exposed: No Muscle Exposed: No Joint Exposed: No Bone Exposed: No Periwound Skin Texture Texture Color No Abnormalities Noted: Yes No Abnormalities Noted: No Atrophie Blanche: No Moisture Cyanosis: No No Abnormalities Noted: Yes Ecchymosis: No Erythema: No Hemosiderin Staining: No Mottled: No Pallor: No Rubor: No Temperature / Pain Temperature: No Abnormality Electronic Signature(s) Signed: 06/27/2022 4:08:09 PM By: Samuella Bruin Entered By: Samuella Bruin on 06/27/2022 13:43:39 -------------------------------------------------------------------------------- Wound Assessment Details Patient Name: Date of Service: [name], Merlin V. 06/27/2022 1:30 PM Medical Record Number: [id number] Patient Account Number: [id number] SYBILLA, MALHOTRA ([id number]) [phone].pdf Page 8 of 9 Date of Birth/Sex: Treating RN: 09/21/1945 (77 y.o. [name] Primary Care Ulonda Klosowski: Other Clinician: Willow Ora Referring Naina Sleeper: Treating Maddox Hlavaty/Extender: Elsie Saas Weeks in Treatment: 16 Wound Status Wound Number: 4 Primary Etiology: Lesion Wound Location: Right, Distal, Posterior Lower Leg Wound Status: Open Wounding Event: Gradually Appeared Comorbid History: Asthma, Hypertension, Received Radiation Date Acquired: 06/01/2022 Weeks Of Treatment: 3 Clustered Wound: No Photos Wound Measurements Length: (cm) 0.4 Width: (cm) 0.1 Depth: (cm) 0.1 Area: (cm) 0.031 Volume: (cm) 0.003 % Reduction in Area: 90.1% % Reduction in Volume: 95.2% Epithelialization: Small (1-33%) Tunneling: No Undermining: No Wound Description Classification: Full Thickness Without Exposed Support Structures Wound Margin: Distinct, outline attached Exudate Amount: Medium Exudate Type: Serosanguineous Exudate Color: red, brown Foul Odor After  Cleansing: No Slough/Fibrino Yes Wound Bed Granulation Amount: Small (1-33%) Exposed Structure Granulation Quality: Pink Fascia Exposed: No Necrotic Amount: Large (67-100%) Fat Layer (Subcutaneous Tissue) Exposed: Yes Necrotic Quality: Adherent Slough Tendon Exposed: No Muscle Exposed: No Joint Exposed: No Bone Exposed: No Periwound Skin Texture Texture Color No Abnormalities Noted: Yes No Abnormalities Noted: Yes Moisture Temperature / Pain No Abnormalities Noted: Yes Temperature: No Abnormality Treatment Notes Wound #4 (Lower Leg) Wound Laterality: Right, Posterior, Distal Cleanser Soap and Water Discharge Instruction: May shower and wash wound with dial antibacterial soap and water prior to dressing change. Wound Cleanser Discharge Instruction: Cleanse the wound with wound cleanser prior to applying a clean dressing using gauze sponges, not tissue or cotton balls. Peri-Wound Care Ketoconazole Cream 2% Discharge Instruction: Apply Ketoconazole as directed Zinc Oxide Ointment 30g tube Discharge Instruction: Apply Zinc Oxide to periwound with each dressing change LATAYNA, RITCHIE ([id number]) [phone].pdf Page 9 of 9 Sween Lotion (Moisturizing lotion) Discharge Instruction: Apply moisturizing lotion as directed Topical Primary  Dressing Maxorb Extra Ag+ Alginate Dressing, 2x2 (in/in) Discharge Instruction: Apply to wound bed as instructed Secondary Dressing Zetuvit Plus 4x8 in Discharge Instruction: Apply over primary dressing as directed. Secured With Compression Wrap ThreePress (3 layer compression wrap) Discharge Instruction: Apply three layer compression as directed. Netting,Tubular #5 Compression Stockings Add-Ons Electronic Signature(s) Signed: 06/27/2022 4:08:09 PM By: Samuella Bruin Entered By: Samuella Bruin on 06/27/2022 13:44:03 -------------------------------------------------------------------------------- Vitals  Details Patient Name: Date of Service: [name], Ada V. 06/27/2022 1:30 PM Medical Record Number: [id number] Patient Account Number: [id number] Date of Birth/Sex: Treating RN: 1945/08/03 (77 y.o. F) Primary Care Kreg Earhart: Willow Ora Other Clinician: Referring Doil Kamara: Treating Branna Cortina/Extender: Cresenciano Genre in Treatment: 16 Vital Signs Time Taken: 01:31 Temperature (F): 97.9 Height (in): 60 Pulse (bpm): 58 Weight (lbs): 126 Respiratory Rate (breaths/min): 18 Body Mass Index (BMI): 24.6 Blood Pressure (mmHg): 166/95 Reference Range: 80 - 120 mg / dl Electronic Signature(s) Signed: 06/27/2022 3:35:12 PM By: Dayton Scrape Entered By: Dayton Scrape on 06/27/2022 13:31:27

## 2022-06-28 NOTE — Progress Notes (Signed)
MARC, SIVERTSEN ([id number]) [id number] Page 1 of 11 Visit Report for 06/27/2022 Chief Complaint Document Details Patient Name: Date of Service: [name], [name] 06/27/2022 1:30 PM Medical Record Number: [id number] Patient Account Number: [id number] Date of Birth/Sex: Treating RN: 03-Apr-1945 (77 y.o. F) Primary Care Provider: Willow Ora Other Clinician: Referring Provider: Treating Provider/Extender: Cresenciano Genre in Treatment: 16 Information Obtained from: Patient Chief Complaint Patient seen for complaints of Non-Healing Wound. Electronic Signature(s) Signed: 06/27/2022 2:07:01 PM By: Duanne Guess MD FACS Entered By: Duanne Guess on 06/27/2022 14:07:01 -------------------------------------------------------------------------------- Debridement Details Patient Name: Date of Service: [name], [name] 06/27/2022 1:30 PM Medical Record Number: [id number] Patient Account Number: [id number] Date of Birth/Sex: Treating RN: Feb 15, 1946 (77 y.o. [name] Primary Care Provider: Willow Ora Other Clinician: Referring Provider: Treating Provider/Extender: Cresenciano Genre in Treatment: 16 Debridement Performed for Assessment: Wound #1 Right Achilles Performed By: Physician Duanne Guess, MD Debridement Type: Debridement Level of Consciousness (Pre-procedure): Awake and Alert Pre-procedure Verification/Time Out Yes - 13:49 Taken: Start Time: 13:49 Pain Control: Lidocaine 4% T opical Solution Percent of Wound Bed Debrided: 100% T Area Debrided (cm): otal 1.18 Tissue and other material debrided: Non-Viable, Slough, Slough Level: Non-Viable Tissue Debridement Description: Selective/Open Wound Instrument: Curette Bleeding: Minimum Hemostasis Achieved: Pressure Response to Treatment: Procedure was tolerated well Level of Consciousness (Post- Awake and Alert procedure): Post Debridement Measurements of Total  Wound Length: (cm) 1 Width: (cm) 1.5 Depth: (cm) 0.1 Volume: (cm) 0.118 Character of Wound/Ulcer Post Debridement: Improved Post Procedure Diagnosis Same as Pre-procedure Notes scribed for Dr. Lady Gary by Samuella Bruin, RN Electronic Signature(s) Signed: 06/27/2022 2:24:35 PM By: Duanne Guess MD FACS Signed: 06/27/2022 4:08:09 PM By: Samuella Bruin Entered By: Samuella Bruin on 06/27/2022 13:50:01 [name] ([id number]) [id number] Page 2 of 11 -------------------------------------------------------------------------------- Debridement Details Patient Name: Date of Service: [name], [name] 06/27/2022 1:30 PM Medical Record Number: [id number] Patient Account Number: [id number] Date of Birth/Sex: Treating RN: 03-29-1945 (77 y.o. [name] Primary Care Provider: Willow Ora Other Clinician: Referring Provider: Treating Provider/Extender: Cresenciano Genre in Treatment: 16 Debridement Performed for Assessment: Wound #4 Right,Distal,Posterior Lower Leg Performed By: Physician Duanne Guess, MD Debridement Type: Debridement Level of Consciousness (Pre-procedure): Awake and Alert Pre-procedure Verification/Time Out Yes - 13:49 Taken: Start Time: 13:49 Pain Control: Lidocaine 4% T opical Solution Percent of Wound Bed Debrided: 100% T Area Debrided (cm): otal 0.03 Tissue and other material debrided: Non-Viable, Slough, Slough Level: Non-Viable Tissue Debridement Description: Selective/Open Wound Instrument: Curette Bleeding: Minimum Hemostasis Achieved: Pressure Response to Treatment: Procedure was tolerated well Level of Consciousness (Post- Awake and Alert procedure): Post Debridement Measurements of Total Wound Length: (cm) 0.4 Width: (cm) 0.1 Depth: (cm) 0.1 Volume: (cm) 0.003 Character of Wound/Ulcer Post Debridement: Improved Post Procedure Diagnosis Same as Pre-procedure Notes scribed for Dr.  Lady Gary by Samuella Bruin, RN Electronic Signature(s) Signed: 06/27/2022 2:24:35 PM By: Duanne Guess MD FACS Signed: 06/27/2022 4:08:09 PM By: Samuella Bruin Entered By: Samuella Bruin on 06/27/2022 13:50:29 -------------------------------------------------------------------------------- HPI Details Patient Name: Date of Service: [name], [name] 06/27/2022 1:30 PM Medical Record Number: [id number] Patient Account Number: [id number] Date of Birth/Sex: Treating RN: 03-24-1945 (77 y.o. F) Primary Care Provider: Willow Ora Other Clinician: Referring Provider: Treating Provider/Extender: Cresenciano Genre in Treatment: 16 History of Present Illness HPI Description: ADMISSION 03/01/2022 This is a 77 year old otherwise fairly healthy woman (not diabetic, not obese, does not smoke)  who presents to the clinic with an ulcer on her right posterior leg. She has a history of a liposarcoma excised in 1973. The patient is not sure but believes she received radiation therapy to the location. She did have a skin graft. About 6 months ago, she developed a small ulcer in the area that seemed to start as an abscess. She has had an open wound in that area since then. A shave biopsy was taken by a dermatologist that showed inflammation without evidence of malignancy. She has been applying Vaseline to the site along with periwound TCA. ABI in clinic today is 0.94. Due to the failure of the wound to heal properly, she has been referred to the wound care center for further evaluation and management. 03/09/2022: The tissue on the wound is protruding further from the skin surface. It does not have a typical appearance consistent with hypertrophic granulation tissue. I am concerned that it may represent malignancy. 03/17/2022: Fortunately, the biopsy that I took last week was negative for malignancy. It returned as consistent with an ulcer and inflamed granulation tissue. [name], [name]  ([id number]) [id number] Page 3 of 11 She still has hypertrophic granulation tissue at the site and some slough accumulation on the surface. No significant change in the wound dimensions. 03/23/2022: The wound measured smaller today and the hypertrophic granulation tissue is less prominent. Due to the unusual shape of her leg from old surgery, however, there has been some friction from her compressive wrap. 03/31/2022: The wound is slightly smaller today. There is light slough on the surface. There is still some hypertrophic granulation tissue present. 04/12/2022: She has a new wound near the top of her surgical defect. Where the skin rolls over, there has been some friction and abrasion that has resulted in tissue breakdown. There is slough buildup on the surface. The original wound is smaller and there is epithelialization around the borders. There is slough buildup on the surface again. She has been approved for TheraSkin but it is unclear what her financial responsibility would be at this point. We are still working on determining that. 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 04/17/2022: Both posterior leg wounds are smaller today. They both have slough accumulation. She had her Mohs surgery and there is a defect on her anterior tibial surface. I spoke with her dermatologist and we are going to assume care for this wound. There is a layer of rubbery slough on this wound and it appears a little dry. 04/24/2022: All 3 wounds have a thick layer of rubbery slough on them. There is hypertrophic granulation tissue underneath this on both the anterior tibial surface wound and the posterior Achilles wound. She has a wound on her right forearm that was cultured by her dermatologist and grew out heavy Serratia marcescens and moderate Staph aureus. Apparently they are just having her apply topical mupirocin to the site  because she has previously had a reaction to moxifloxacin, which is apparently the antibiotic they wanted to use. She asks if I have any other recommendations. 05/01/2022: All 3 wounds look better this week. The hypertrophic granulation tissue has resolved on her anterior tibia and posterior Achilles wound. The wounds are all smaller. They have some slough accumulation. The moisture balance on the anterior tibial wound is good; the posterior wounds are a bit dry. 05/08/2022: The anterior tibial wound is smaller without any hypertrophic granulation tissue. There is a little bit of slough  buildup. There is more slough on the posterior Achilles wound. The posterior wound under the flap of skin is a little bit macerated. 05/15/2022: The anterior tibial wound is healed. The posterior Achilles wound has heavy slough buildup. The posterior wound under the flap of skin continues to be macerated with slough accumulation. 05/29/2022: We are still dealing with a lot of moisture accumulation under the skin flap. There was an odor coming from the wound today when her dressing was removed. The Achilles wound has slough accumulation once again. 06/05/2022: Unfortunately, she seems to have had an allergic reaction to some component of her dressing, possibly the medium for the TCA. She has broken out on her entire lower leg. There has been more moisture accumulation under the flap of tissue on her posterior leg and a linear ulcer has opened just caudal to the existing ulcer in this site. There is slough on the Achilles wound, but there is also more epithelialization. 06/13/2022: All of the wounds look significantly better this week. We have much better moisture control on the posterior leg near the flap of tissue. There is some slough accumulation on all wound surfaces. Her skin looks less angry this week, as well. 06/20/2022: Both of the small wounds near the skin fold have contracted. There is some slough on the surface. The  initial wound has some thick rubbery slough on the surface but is epithelializing. Her skin is in better condition this week. 06/27/2022: The more proximal wound under the skin fold is closed. The smaller linear wound just distal to this remains open with some slough on the surface. The primary wound measured the same size, but visually it is smaller this week. There is rubbery slough on the surface. Her skin remains in good condition. Electronic Signature(s) Signed: 06/27/2022 2:08:42 PM By: Duanne Guess MD FACS Entered By: Duanne Guess on 06/27/2022 14:08:42 -------------------------------------------------------------------------------- Physical Exam Details Patient Name: Date of Service: [name], [name] 06/27/2022 1:30 PM Medical Record Number: [id number] Patient Account Number: [id number] Date of Birth/Sex: Treating RN: [birth year] (77 y.o. F) Primary Care Provider: Willow Ora Other Clinician: Referring Provider: Treating Provider/Extender: Cresenciano Genre in Treatment: 16 Constitutional Hypertensive, asymptomatic. Slightly bradycardic. . . no acute distress. Respiratory Normal work of breathing on room air. Notes 06/27/2022: The more proximal wound under the skin fold is closed. The smaller linear wound just distal to this remains open with some slough on the surface. The primary wound measured the same size, but visually it is smaller this week. There is rubbery slough on the surface. Her skin remains in good condition. Electronic Signature(s) Signed: 06/27/2022 2:09:27 PM By: Duanne Guess MD FACS Entered By: Duanne Guess on 06/27/2022 14:09:27 Physician Orders Details -------------------------------------------------------------------------------- [name] ([id number]) [id number] Page 4 of 11 Patient Name: Date of Service: [name], [name] 06/27/2022 1:30 PM Medical Record Number: [id number] Patient Account Number:  [id number] Date of Birth/Sex: Treating RN: Nov 26, 1945 (77 y.o. [name] Primary Care Provider: Willow Ora Other Clinician: Referring Provider: Treating Provider/Extender: Cresenciano Genre in Treatment: [id number] Verbal / Phone Orders: No Diagnosis Coding ICD-10 Coding Code Description [phone] Non-pressure chronic ulcer of other part of right lower leg with fat layer exposed L97.818 Non-pressure chronic ulcer of other part of right lower leg with other specified severity I10 Essential (primary) hypertension Z85.831 Personal history of malignant neoplasm of soft tissue Z92.3 Personal history of irradiation Follow-up Appointments ppointment in 1 week. - Dr. Lady Gary Room 2  Return A Anesthetic (In clinic) Topical Lidocaine 4% applied to wound bed Bathing/ Shower/ Hygiene May shower with protection but do not get wound dressing(s) wet. Protect dressing(s) with water repellant cover (for example, large plastic bag) or a cast cover and may then take shower. - Please do not get leg wraps wet on Right Leg. May use a cast protector on right leg- can purchase from [employer]; Medical supply store; [facility] etc. the cast protector costs approximately $18-$29 Edema Control - Lymphedema / SCD / Other void standing for long periods of time. - Rest and elevate legs throughout the day. A Moisturize legs daily. - Use a lotion or moisturizer for example :Sween,Eucerin;Aquaphor etc. Wound Treatment Wound #1 - Achilles Wound Laterality: Right Cleanser: Soap and Water 1 x Per Week/30 Days Discharge Instructions: May shower and wash wound with dial antibacterial soap and water prior to dressing change. Cleanser: Wound Cleanser 1 x Per Week/30 Days Discharge Instructions: Cleanse the wound with wound cleanser prior to applying a clean dressing using gauze sponges, not tissue or cotton balls. Peri-Wound Care: Ketoconazole Cream 2% 1 x Per Week/30 Days Discharge Instructions: Apply Ketoconazole as  directed Peri-Wound Care: Zinc Oxide Ointment 30g tube 1 x Per Week/30 Days Discharge Instructions: Apply Zinc Oxide to periwound with each dressing change Peri-Wound Care: Sween Lotion (Moisturizing lotion) 1 x Per Week/30 Days Discharge Instructions: Apply moisturizing lotion as directed Prim Dressing: Maxorb Extra Ag+ Alginate Dressing, 2x2 (in/in) 1 x Per Week/30 Days ary Discharge Instructions: Apply to wound bed as instructed Secondary Dressing: Zetuvit Plus 4x8 in 1 x Per Week/30 Days Discharge Instructions: Apply over primary dressing as directed. Compression Wrap: ThreePress (3 layer compression wrap) 1 x Per Week/30 Days Discharge Instructions: Apply three layer compression as directed. Compression Wrap: Netting,Tubular #5 1 x Per Week/30 Days Wound #4 - Lower Leg Wound Laterality: Right, Posterior, Distal Cleanser: Soap and Water 1 x Per Week/30 Days Discharge Instructions: May shower and wash wound with dial antibacterial soap and water prior to dressing change. Cleanser: Wound Cleanser 1 x Per Week/30 Days Discharge Instructions: Cleanse the wound with wound cleanser prior to applying a clean dressing using gauze sponges, not tissue or cotton balls. Peri-Wound Care: Ketoconazole Cream 2% 1 x Per Week/30 Days Discharge Instructions: Apply Ketoconazole as directed Peri-Wound Care: Zinc Oxide Ointment 30g tube 1 x Per Week/30 Days Discharge Instructions: Apply Zinc Oxide to periwound with each dressing change Peri-Wound Care: Sween Lotion (Moisturizing lotion) 1 x Per Week/30 Days ROSILAND, SEN ([id number]) [phone].pdf Page 5 of 11 Discharge Instructions: Apply moisturizing lotion as directed Prim Dressing: Maxorb Extra Ag+ Alginate Dressing, 2x2 (in/in) 1 x Per Week/30 Days ary Discharge Instructions: Apply to wound bed as instructed Secondary Dressing: Zetuvit Plus 4x8 in 1 x Per Week/30 Days Discharge Instructions: Apply over primary dressing as  directed. Compression Wrap: ThreePress (3 layer compression wrap) 1 x Per Week/30 Days Discharge Instructions: Apply three layer compression as directed. Compression Wrap: Netting,Tubular #5 1 x Per Week/30 Days Patient Medications llergies: bee venom protein (honey bee), moxifloxacin, promethazine HCl, balsam [place], thimerosal A Notifications Medication Indication Start End 06/27/2022 lidocaine DOSE topical 4 % cream - cream topical Electronic Signature(s) Signed: 06/27/2022 2:24:35 PM By: Duanne Guess MD FACS Entered By: Duanne Guess on 06/27/2022 14:09:59 -------------------------------------------------------------------------------- Problem List Details Patient Name: Date of Service: [name], Durenda V. 06/27/2022 1:30 PM Medical Record Number: [id number] Patient Account Number: [id number] Date of Birth/Sex: Treating RN: May 05, 1945 (77 y.o. F) Primary Care Provider:  Willow Ora Other Clinician: Referring Provider: Treating Provider/Extender: Elsie Saas Weeks in Treatment: 16 Active Problems ICD-10 Encounter Code Description Active Date MDM Diagnosis L97.812 Non-pressure chronic ulcer of other part of right lower leg with fat layer 03/01/2022 No Yes exposed L97.818 Non-pressure chronic ulcer of other part of right lower leg with other specified 04/17/2022 No Yes severity I10 Essential (primary) hypertension 03/01/2022 No Yes Z85.831 Personal history of malignant neoplasm of soft tissue 03/01/2022 No Yes Z92.3 Personal history of irradiation 03/01/2022 No Yes Inactive Problems Resolved Problems Electronic Signature(s) Signed: 06/27/2022 2:06:27 PM By: Duanne Guess MD FACS [name], [name] ([id number].pdf Page 6 of 11 Signed: 06/27/2022 2:06:27 PM By: Duanne Guess MD FACS Entered By: Duanne Guess on 06/27/2022 14:06:27 -------------------------------------------------------------------------------- Progress Note  Details Patient Name: Date of Service: [name], Kolbie V. 06/27/2022 1:30 PM Medical Record Number: [id number] Patient Account Number: [id number] Date of Birth/Sex: Treating RN: [birth year] (77 y.o. F) Primary Care Provider: Willow Ora Other Clinician: Referring Provider: Treating Provider/Extender: Cresenciano Genre in Treatment: 16 Subjective Chief Complaint Information obtained from Patient Patient seen for complaints of Non-Healing Wound. History of Present Illness (HPI) ADMISSION 03/01/2022 This is a 77 year old otherwise fairly healthy woman (not diabetic, not obese, does not smoke) who presents to the clinic with an ulcer on her right posterior leg. She has a history of a liposarcoma excised in 1973. The patient is not sure but believes she received radiation therapy to the location. She did have a skin graft. About 6 months ago, she developed a small ulcer in the area that seemed to start as an abscess. She has had an open wound in that area since then. A shave biopsy was taken by a dermatologist that showed inflammation without evidence of malignancy. She has been applying Vaseline to the site along with periwound TCA. ABI in clinic today is 0.94. Due to the failure of the wound to heal properly, she has been referred to the wound care center for further evaluation and management. 03/09/2022: The tissue on the wound is protruding further from the skin surface. It does not have a typical appearance consistent with hypertrophic granulation tissue. I am concerned that it may represent malignancy. 03/17/2022: Fortunately, the biopsy that I took last week was negative for malignancy. It returned as consistent with an ulcer and inflamed granulation tissue. She still has hypertrophic granulation tissue at the site and some slough accumulation on the surface. No significant change in the wound dimensions. 03/23/2022: The wound measured smaller today and the hypertrophic granulation  tissue is less prominent. Due to the unusual shape of her leg from old surgery, however, there has been some friction from her compressive wrap. 03/31/2022: The wound is slightly smaller today. There is light slough on the surface. There is still some hypertrophic granulation tissue present. 04/12/2022: She has a new wound near the top of her surgical defect. Where the skin rolls over, there has been some friction and abrasion that has resulted in tissue breakdown. There is slough buildup on the surface. The original wound is smaller and there is epithelialization around the borders. There is slough buildup on the surface again. She has been approved for TheraSkin but it is unclear what her financial responsibility would be at this point. We are still working on determining that. 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 04/17/2022: Both posterior leg wounds are smaller today. They both have slough  accumulation. She had her Mohs surgery and there is a defect on her anterior tibial surface. I spoke with her dermatologist and we are going to assume care for this wound. There is a layer of rubbery slough on this wound and it appears a little dry. 04/24/2022: All 3 wounds have a thick layer of rubbery slough on them. There is hypertrophic granulation tissue underneath this on both the anterior tibial surface wound and the posterior Achilles wound. She has a wound on her right forearm that was cultured by her dermatologist and grew out heavy Serratia marcescens and moderate Staph aureus. Apparently they are just having her apply topical mupirocin to the site because she has previously had a reaction to moxifloxacin, which is apparently the antibiotic they wanted to use. She asks if I have any other recommendations. 05/01/2022: All 3 wounds look better this week. The hypertrophic granulation tissue has resolved on her anterior tibia and posterior  Achilles wound. The wounds are all smaller. They have some slough accumulation. The moisture balance on the anterior tibial wound is good; the posterior wounds are a bit dry. 05/08/2022: The anterior tibial wound is smaller without any hypertrophic granulation tissue. There is a little bit of slough buildup. There is more slough on the posterior Achilles wound. The posterior wound under the flap of skin is a little bit macerated. 05/15/2022: The anterior tibial wound is healed. The posterior Achilles wound has heavy slough buildup. The posterior wound under the flap of skin continues to be macerated with slough accumulation. 05/29/2022: We are still dealing with a lot of moisture accumulation under the skin flap. There was an odor coming from the wound today when her dressing was removed. The Achilles wound has slough accumulation once again. 06/05/2022: Unfortunately, she seems to have had an allergic reaction to some component of her dressing, possibly the medium for the TCA. She has broken out on her entire lower leg. There has been more moisture accumulation under the flap of tissue on her posterior leg and a linear ulcer has opened just caudal to the existing ulcer in this site. There is slough on the Achilles wound, but there is also more epithelialization. 06/13/2022: All of the wounds look significantly better this week. We have much better moisture control on the posterior leg near the flap of tissue. There is some slough accumulation on all wound surfaces. Her skin looks less angry this week, as well. 06/20/2022: Both of the small wounds near the skin fold have contracted. There is some slough on the surface. The initial wound has some thick rubbery slough on the surface but is epithelializing. Her skin is in better condition this week. 06/27/2022: The more proximal wound under the skin fold is closed. The smaller linear wound just distal to this remains open with some slough on the surface. The  primary wound measured the same size, but visually it is smaller this week. There is rubbery slough on the surface. Her skin remains in good condition. Patient History Information obtained from Patient. Family History Unknown History. [name], [name] ([id number]) [id number] Page 7 of 11 Social History Never smoker, Marital Status - Married, Alcohol Use - Never, Drug Use - No History, Caffeine Use - Never. Medical History Respiratory Patient has history of Asthma - Childhood asthma Cardiovascular Patient has history of Hypertension Oncologic Patient has history of Received Radiation - R/T Liposarcoma in 1972 Hospitalization/Surgery History - Left Breast Lumpectomy;Tonsillectomy ; Squamous cell carcinoma; Hysterectomy;Right Leg liposarcoma. Medical A Surgical History Notes  nd Eyes Pt. 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 Slightly bradycardic. no acute distress. Vitals Time Taken: 1:31 AM, Height: 60 in, Weight: 126 lbs, BMI: 24.6, Temperature: 97.9 F, Pulse: 58 bpm, Respiratory Rate: 18 breaths/min, Blood Pressure: 166/95 mmHg. Respiratory Normal work of breathing on room air. General Notes: 06/27/2022: The more proximal wound under the skin fold is closed. The smaller linear wound just distal to this remains open with some slough on the surface. The primary wound measured the same size, but visually it is smaller this week. There is rubbery slough on the surface. Her skin remains in  good condition. Integumentary (Hair, Skin) Wound #1 status is Open. Original cause of wound was Gradually Appeared. The date acquired was: 08/30/2021. The wound has been in treatment 16 weeks. The wound is located on the Right Achilles. The wound measures 1cm length x 1.5cm width x 0.1cm depth; 1.178cm^2 area and 0.118cm^3 volume. There is Fat Layer (Subcutaneous Tissue) exposed. There is no tunneling or undermining noted. There is a medium amount of serosanguineous drainage noted. The wound margin is distinct with the outline attached to the wound base. There is large (67-100%) red, hyper - granulation within the wound bed. There is a small (1-33%) amount of necrotic tissue within the wound bed including Adherent Slough. The periwound skin appearance had no abnormalities noted for texture. The periwound skin appearance had no abnormalities noted for moisture. The periwound skin appearance exhibited: Rubor. The periwound skin appearance did not exhibit: Atrophie Blanche, Cyanosis, Ecchymosis, Hemosiderin Staining, Mottled, Pallor, Erythema. Periwound temperature was noted as No Abnormality. Wound #2 status is Open. Original cause of wound was Shear/Friction. The date acquired was: 04/08/2022. The wound has been in treatment 10 weeks. The wound is located on the Right,Posterior Lower Leg. The wound measures 0cm length x 0cm width x 0cm depth; 0cm^2 area and 0cm^3 volume. There is no tunneling or undermining noted. There is a none present amount of drainage noted. The wound margin is distinct with the outline attached to the wound base. There is no granulation within the wound bed. There is no necrotic tissue within the wound bed. The periwound skin appearance had no abnormalities noted for texture. The periwound skin appearance had no abnormalities noted for moisture. The periwound skin appearance did not exhibit: Atrophie Blanche, Cyanosis, Ecchymosis, Hemosiderin Staining, Mottled, Pallor, Rubor,  Erythema. Periwound temperature was noted as No Abnormality. Wound #4 status is Open. Original cause of wound was Gradually Appeared. The date acquired was: 06/01/2022. The wound has been in treatment 3 weeks. The wound is located on the Right,Distal,Posterior Lower Leg. The wound measures 0.4cm length x 0.1cm width x 0.1cm depth; 0.031cm^2 area and 0.003cm^3 volume. There is Fat Layer (Subcutaneous Tissue) exposed. There is no tunneling or undermining noted. There is a medium amount of serosanguineous drainage noted. The wound margin is distinct with the outline attached to the wound base. There is small (1-33%) pink granulation within the wound bed. There is a large (67-100%) amount of necrotic tissue within the wound bed including Adherent Slough. The periwound skin appearance had no abnormalities noted for texture. The periwound skin appearance had no abnormalities noted for moisture. The periwound  skin appearance had no abnormalities noted for color. Periwound temperature was noted as No Abnormality. Assessment Active Problems ICD-10 [name], [name] ([id number]) [id number] Page 8 of 11 Non-pressure chronic ulcer of other part of right lower leg with fat layer exposed Non-pressure chronic ulcer of other part of right lower leg with other specified severity Essential (primary) hypertension Personal history of malignant neoplasm of soft tissue Personal history of irradiation Procedures Wound #1 Pre-procedure diagnosis of Wound #1 is an Abscess located on the Right Achilles . There was a Selective/Open Wound Non-Viable Tissue Debridement with a total area of 1.18 sq cm performed by Duanne Guess, MD. With the following instrument(s): Curette to remove Non-Viable tissue/material. Material removed includes [facility] after achieving pain control using Lidocaine 4% Topical Solution. No specimens were taken. A time out was conducted at 13:49, prior to the start of the  procedure. A Minimum amount of bleeding was controlled with Pressure. The procedure was tolerated well. Post Debridement Measurements: 1cm length x 1.5cm width x 0.1cm depth; 0.118cm^3 volume. Character of Wound/Ulcer Post Debridement is improved. Post procedure Diagnosis Wound #1: Same as Pre-Procedure General Notes: scribed for Dr. Lady Gary by Samuella Bruin, RN. Pre-procedure diagnosis of Wound #1 is an Abscess located on the Right Achilles . There was a Three Layer Compression Therapy Procedure by Samuella Bruin, RN. Post procedure Diagnosis Wound #1: Same as Pre-Procedure Wound #4 Pre-procedure diagnosis of Wound #4 is a Lesion located on the Right,Distal,Posterior Lower Leg . There was a Selective/Open Wound Non-Viable Tissue Debridement with a total area of 0.03 sq cm performed by Duanne Guess, MD. With the following instrument(s): Curette to remove Non-Viable tissue/material. Material removed includes [facility] after achieving pain control using Lidocaine 4% Topical Solution. No specimens were taken. A time out was conducted at 13:49, prior to the start of the procedure. A Minimum amount of bleeding was controlled with Pressure. The procedure was tolerated well. Post Debridement Measurements: 0.4cm length x 0.1cm width x 0.1cm depth; 0.003cm^3 volume. Character of Wound/Ulcer Post Debridement is improved. Post procedure Diagnosis Wound #4: Same as Pre-Procedure General Notes: scribed for Dr. Lady Gary by Samuella Bruin, RN. Plan Follow-up Appointments: Return Appointment in 1 week. - Dr. Lady Gary Room 2 Anesthetic: (In clinic) Topical Lidocaine 4% applied to wound bed Bathing/ Shower/ Hygiene: May shower with protection but do not get wound dressing(s) wet. Protect dressing(s) with water repellant cover (for example, large plastic bag) or a cast cover and may then take shower. - Please do not get leg wraps wet on Right Leg. May use a cast protector on right leg- can purchase from  [employer]; Medical supply store; [facility] etc. the cast protector costs approximately $18-$29 Edema Control - Lymphedema / SCD / Other: Avoid standing for long periods of time. - Rest and elevate legs throughout the day. Moisturize legs daily. - Use a lotion or moisturizer for example :Sween,Eucerin;Aquaphor etc. The following medication(s) was prescribed: lidocaine topical 4 % cream cream topical was prescribed at facility WOUND #1: - Achilles Wound Laterality: Right Cleanser: Soap and Water 1 x Per Week/30 Days Discharge Instructions: May shower and wash wound with dial antibacterial soap and water prior to dressing change. Cleanser: Wound Cleanser 1 x Per Week/30 Days Discharge Instructions: Cleanse the wound with wound cleanser prior to applying a clean dressing using gauze sponges, not tissue or cotton balls. Peri-Wound Care: Ketoconazole Cream 2% 1 x Per Week/30 Days Discharge Instructions: Apply Ketoconazole as directed Peri-Wound Care: Zinc Oxide Ointment 30g tube 1 x Per Week/30  Days Discharge Instructions: Apply Zinc Oxide to periwound with each dressing change Peri-Wound Care: Sween Lotion (Moisturizing lotion) 1 x Per Week/30 Days Discharge Instructions: Apply moisturizing lotion as directed Prim Dressing: Maxorb Extra Ag+ Alginate Dressing, 2x2 (in/in) 1 x Per Week/30 Days ary Discharge Instructions: Apply to wound bed as instructed Secondary Dressing: Zetuvit Plus 4x8 in 1 x Per Week/30 Days Discharge Instructions: Apply over primary dressing as directed. Com pression Wrap: ThreePress (3 layer compression wrap) 1 x Per Week/30 Days Discharge Instructions: Apply three layer compression as directed. Com pression Wrap: Netting,Tubular #5 1 x Per Week/30 Days WOUND #4: - Lower Leg Wound Laterality: Right, Posterior, Distal Cleanser: Soap and Water 1 x Per Week/30 Days Discharge Instructions: May shower and wash wound with dial antibacterial soap and water prior to dressing  change. Cleanser: Wound Cleanser 1 x Per Week/30 Days Discharge Instructions: Cleanse the wound with wound cleanser prior to applying a clean dressing using gauze sponges, not tissue or cotton balls. Peri-Wound Care: Ketoconazole Cream 2% 1 x Per Week/30 Days Discharge Instructions: Apply Ketoconazole as directed Peri-Wound Care: Zinc Oxide Ointment 30g tube 1 x Per Week/30 Days Discharge Instructions: Apply Zinc Oxide to periwound with each dressing change Peri-Wound Care: Sween Lotion (Moisturizing lotion) 1 x Per Week/30 Days Discharge Instructions: Apply moisturizing lotion as directed Prim Dressing: Maxorb Extra Ag+ Alginate Dressing, 2x2 (in/in) 1 x Per Week/30 Days ary Discharge Instructions: Apply to wound bed as instructed Secondary Dressing: Zetuvit Plus 4x8 in 1 x Per Week/30 Days Discharge Instructions: Apply over primary dressing as directed. ITZELL, BENDAVID ([id number]) [id number] Page 9 of 11 Compression Wrap: ThreePress (3 layer compression wrap) 1 x Per Week/30 Days Discharge Instructions: Apply three layer compression as directed. Compression Wrap: Netting,Tubular #5 1 x Per Week/30 Days 06/27/2022: The more proximal wound under the skin fold is closed. The smaller linear wound just distal to this remains open with some slough on the surface. The primary wound measured the same size, but visually it is smaller this week. There is rubbery slough on the surface. Her skin remains in good condition. I used a curette to debride slough from both of the remaining wounds. We will continue silver alginate to the wound surfaces. We will continue topical ketoconazole mixed with zinc oxide to the periwound skin. I am also going to check a piece of silver alginate under the skin flap to try to avoid further moisture accumulation in this area. Continue 3 layer compression. Follow-up in 1 week. Electronic Signature(s) Signed: 06/27/2022 2:11:01 PM By: Duanne Guess MD FACS Entered By: Duanne Guess on 06/27/2022 14:11:01 -------------------------------------------------------------------------------- HxROS Details Patient Name: Date of Service: [name], Evonna V. 06/27/2022 1:30 PM Medical Record Number: [id number] Patient Account Number: [id number] Date of Birth/Sex: Treating RN: November 10, 1945 (77 y.o. 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 Respiratory Medical History: Positive for: Asthma - Childhood asthma Cardiovascular Medical History: Positive for: Hypertension Past Medical History Notes: Hx: Angio-edema r/t Bee venom; Hyperlipidemia Endocrine Medical History: Past Medical History Notes: Hx: hypothyroidism Immunological Medical History: Past Medical History Notes: Hx: Currently being treated with Immunotherapy (r/t Bee-Venom) Integumentary (Skin) Medical History: Past Medical History Notes: Hx: Urticaria Musculoskeletal Medical History: Past Medical History Notes: Hx: Osteoporosis Oncologic CHARLII, YOST ([id number]) [id number] Page 10 of 11 Medical History: Positive for: Received Radiation -  R/T Liposarcoma in 1972 Past Medical History Notes: 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)-Biopsy taken to rule out squamous cell carcinoma on right arm and right leg (02/24/22), still pending results Immunizations Pneumococcal Vaccine: Received Pneumococcal Vaccination: Yes Received Pneumococcal Vaccination On or After 60th Birthday: Yes Implantable Devices No devices added Hospitalization / Surgery History Type of Hospitalization/Surgery Left Breast Lumpectomy;Tonsillectomy ; Squamous cell carcinoma; Hysterectomy;Right Leg  liposarcoma Family and Social History Unknown History: Yes; Never smoker; Marital Status - Married; Alcohol Use: Never; Drug Use: No History; Caffeine Use: Never; Financial Concerns: No; Food, Clothing or Shelter Needs: No; Support System Lacking: No; Transportation Concerns: No Electronic Signature(s) Signed: 06/27/2022 2:24:35 PM By: Duanne Guess MD FACS Entered By: Duanne Guess on 06/27/2022 14:08:48 -------------------------------------------------------------------------------- SuperBill Details Patient Name: Date of Service: Misty Jacky Kindle 06/27/2022 Medical Record Number: [id number] Patient Account Number: [id number] Date of Birth/Sex: Treating RN: 10/03/1945 (77 y.o. F) Primary Care Provider: Willow Ora Other Clinician: Referring Provider: Treating Provider/Extender: Elsie Saas Weeks in Treatment: 16 Diagnosis Coding ICD-10 Codes Code Description [id number] Non-pressure chronic ulcer of other part of right lower leg with fat layer exposed L97.818 Non-pressure chronic ulcer of other part of right lower leg with other specified severity I10 Essential (primary) hypertension Z85.831 Personal history of malignant neoplasm of soft tissue Z92.3 Personal history of irradiation Facility Procedures : CPT4 Code: [id number] Description: [phone] - DEBRIDE WOUND 1ST 20 SQ CM OR < ICD-10 Diagnosis Description L97.812 Non-pressure chronic ulcer of other part of right lower leg with fat layer exposed L97.818 Non-pressure chronic ulcer of other part of right lower leg with other  specified s Modifier: everity Quantity: 1 Physician Procedures : CPT4 Code Description Modifier [id number] - WC PHYS LEVEL 4 - EST PT 25 ICD-10 Diagnosis Description L97.812 Non-pressure chronic ulcer of other part of right lower leg with fat layer exposed L97.818 Non-pressure chronic ulcer of other part of right  lower leg with other specified severity Z85.831 Personal history of malignant neoplasm  of soft tissue Z92.3 Personal history of irradiation Quantity: 1 : [id number] - WC PHYS DEBR WO ANESTH 20 SQ CM ICD-10 Diagnosis Description L97.812 Non-pressure chronic ulcer of other part of right lower leg with fat layer exposed L97.818 Non-pressure chronic ulcer of other part of right lower leg with other  specified severity [name], CALLIES ([id number]) [id number] Page 11 Quantity: 1 of 11 Electronic Signature(s) Signed: 06/27/2022 2:11:28 PM By: Duanne Guess MD FACS Entered By: Duanne Guess on 06/27/2022 14:11:28

## 2022-06-29 ENCOUNTER — Ambulatory Visit: Payer: Medicare HMO

## 2022-06-30 ENCOUNTER — Ambulatory Visit (INDEPENDENT_AMBULATORY_CARE_PROVIDER_SITE_OTHER): Payer: Medicare HMO

## 2022-06-30 DIAGNOSIS — T63441D Toxic effect of venom of bees, accidental (unintentional), subsequent encounter: Secondary | ICD-10-CM | POA: Diagnosis not present

## 2022-07-04 ENCOUNTER — Encounter (HOSPITAL_BASED_OUTPATIENT_CLINIC_OR_DEPARTMENT_OTHER): Payer: Medicare HMO | Admitting: General Surgery

## 2022-07-04 DIAGNOSIS — L97818 Non-pressure chronic ulcer of other part of right lower leg with other specified severity: Secondary | ICD-10-CM | POA: Diagnosis not present

## 2022-07-04 DIAGNOSIS — E039 Hypothyroidism, unspecified: Secondary | ICD-10-CM | POA: Diagnosis not present

## 2022-07-04 DIAGNOSIS — L97212 Non-pressure chronic ulcer of right calf with fat layer exposed: Secondary | ICD-10-CM | POA: Diagnosis not present

## 2022-07-04 DIAGNOSIS — L97812 Non-pressure chronic ulcer of other part of right lower leg with fat layer exposed: Secondary | ICD-10-CM | POA: Diagnosis not present

## 2022-07-04 DIAGNOSIS — I872 Venous insufficiency (chronic) (peripheral): Secondary | ICD-10-CM | POA: Diagnosis not present

## 2022-07-04 DIAGNOSIS — J45909 Unspecified asthma, uncomplicated: Secondary | ICD-10-CM | POA: Diagnosis not present

## 2022-07-04 DIAGNOSIS — I10 Essential (primary) hypertension: Secondary | ICD-10-CM | POA: Diagnosis not present

## 2022-07-04 DIAGNOSIS — L988 Other specified disorders of the skin and subcutaneous tissue: Secondary | ICD-10-CM | POA: Diagnosis not present

## 2022-07-04 DIAGNOSIS — E785 Hyperlipidemia, unspecified: Secondary | ICD-10-CM | POA: Diagnosis not present

## 2022-07-04 DIAGNOSIS — L02415 Cutaneous abscess of right lower limb: Secondary | ICD-10-CM | POA: Diagnosis not present

## 2022-07-05 NOTE — Progress Notes (Signed)
CINDERELLA, CHRISTOFFERSEN ([id number]) [id number] Page 1 of 8 Visit Report for 07/04/2022 Arrival Information Details Patient Name: Date of Service: [name], [name] 07/04/2022 1:15 PM Medical Record Number: [id number] Patient Account Number: [id number] Date of Birth/Sex: Treating RN: [birth year] (77 y.o. [name] Primary Care Merridy [name]: [name] Other Clinician: Referring [name]: Treating [name]/Extender: [name] in Treatment: 17 Visit Information History Since Last Visit Added or deleted any medications: No Patient Arrived: Ambulatory Any new allergies or adverse reactions: No Arrival Time: 13:20 Had a fall or experienced change in No Accompanied By: self activities of daily living that may affect Transfer Assistance: None risk of falls: Patient Identification Verified: Yes Signs or symptoms of abuse/neglect since last visito No Secondary Verification Process Completed: Yes Hospitalized since last visit: No Patient Requires Transmission-Based Precautions: No Implantable device outside of the clinic excluding No Patient Has Alerts: No cellular tissue based products placed in the center since last visit: Has Dressing in Place as Prescribed: Yes Has Compression in Place as Prescribed: Yes Pain Present Now: No Electronic Signature(s) Signed: 07/04/2022 3:22:44 PM By: [name] Entered By: [name] on 07/04/2022 13:21:14 -------------------------------------------------------------------------------- Compression Therapy Details Patient Name: Date of Service: [name], [name] 07/04/2022 1:15 PM Medical Record Number: [id number] Patient Account Number: [id number] Date of Birth/Sex: Treating RN: 14-Dec-1945 (77 y.o. [name] Primary Care Nicolemarie [name]: [name] Other Clinician: Referring [name]: Treating [name]/Extender: [name] in Treatment: 17 Compression Therapy Performed  for Wound Assessment: Wound #1 Right Achilles Performed By: Clinician Misty Bruin, RN Compression Type: Three Layer Post Procedure Diagnosis Same as Pre-procedure Electronic Signature(s) Signed: 07/04/2022 3:22:44 PM By: [name] Entered By: [name] on 07/04/2022 13:38:10 -------------------------------------------------------------------------------- Encounter Discharge Information Details Patient Name: Date of Service: [name], [name] 07/04/2022 1:15 PM Medical Record Number: [id number] Patient Account Number: [id number] Date of Birth/Sex: Treating RN: [birth year] (77 y.o. [name] Primary Care Haadiya Frogge: [name] Other Clinician: Referring [name]: Treating [name]/Extender: [name] in Treatment: 17 Encounter Discharge Information Items Post Procedure Vitals Discharge Condition: Stable Temperature (F): 98 Ambulatory Status: Ambulatory Pulse (bpm): 73 Discharge Destination: Home Respiratory Rate (breaths/min): 16 Transportation: Private Auto Blood Pressure (mmHg): 159/72 [name], [name] ([id number]) [id number] Page 2 of 8 Accompanied By: self Schedule Follow-up Appointment: Yes Clinical Summary of Care: Patient Declined Electronic Signature(s) Signed: 07/04/2022 3:22:44 PM By: [name] Entered By: [name] on 07/04/2022 13:52:33 -------------------------------------------------------------------------------- Lower Extremity Assessment Details Patient Name: Date of Service: [name] 07/04/2022 1:15 PM Medical Record Number: [id number] Patient Account Number: [id number] Date of Birth/Sex: Treating RN: September 28, 1945 (77 y.o. [name] Primary Care [name]: [name] Other Clinician: Referring Kaizen Ibsen: Treating Ryatt Corsino/Extender: [name] in Treatment: 17 Edema Assessment Assessed: [Left: No] [Right: No] Edema: [Left: Ye]  [Right: s] Calf Left: Right: Point of Measurement: 33 cm From Medial Instep 29.5 cm Ankle Left: Right: Point of Measurement: 9 cm From Medial Instep 18.5 cm Vascular Assessment Pulses: Dorsalis Pedis Palpable: [Right:Yes] Electronic Signature(s) Signed: 07/04/2022 3:22:44 PM By: [name] Entered By: [name] on 07/04/2022 13:24:37 -------------------------------------------------------------------------------- Multi Wound Chart Details Patient Name: Date of Service: [name], [name] 07/04/2022 1:15 PM Medical Record Number: [id number] Patient Account Number: [id number] Date of Birth/Sex: Treating RN: [birth year] (77 y.o. F) Primary Care [name]: [name] Other Clinician: Referring [name]: Treating Deboraha Goar/Extender: [name] in Treatment: 17 Vital Signs  Height(in): 60 Pulse(bpm): 73 Weight(lbs): 126 Blood Pressure(mmHg): 159/72 Body Mass Index(BMI): 24.6 Temperature(F): 98 Respiratory Rate(breaths/min): 16 [1:Photos:] [N/A:N/A [id number] Page 3 of 8] Right Achilles Right, Distal, Posterior Lower Leg N/A Wound Location: Gradually Appeared Gradually Appeared N/A Wounding Event: Abscess Lesion N/A Primary Etiology: Asthma, Hypertension, Received Asthma, Hypertension, Received N/A Comorbid History: Radiation Radiation 08/30/2021 06/01/2022 N/A Date Acquired: 17 4 N/A Weeks of Treatment: Open Open N/A Wound Status: No No N/A Wound Recurrence: 0.7x1.2x0.1 0.1x0.1x0.1 N/A Measurements L x W x D (cm) 0.66 0.008 N/A A (cm) : rea 0.066 0.001 N/A Volume (cm) : 90.10% 97.50% N/A % Reduction in A rea: 90.10% 98.40% N/A % Reduction in Volume: Full Thickness Without Exposed Full Thickness Without Exposed N/A Classification: Support Structures Support Structures Medium Medium N/A Exudate A mount: Serosanguineous Serosanguineous N/A Exudate Type: red, brown red, brown N/A Exudate  Color: Distinct, outline attached Distinct, outline attached N/A Wound Margin: Large (67-100%) Small (1-33%) N/A Granulation A mount: Red, Hyper-granulation Pink N/A Granulation Quality: Small (1-33%) Large (67-100%) N/A Necrotic A mount: Fat Layer (Subcutaneous Tissue): Yes Fat Layer (Subcutaneous Tissue): Yes N/A Exposed Structures: Fascia: No Fascia: No Tendon: No Tendon: No Muscle: No Muscle: No Joint: No Joint: No Bone: No Bone: No Medium (34-66%) Small (1-33%) N/A Epithelialization: Debridement - Selective/Open Wound Debridement - Selective/Open Wound N/A Debridement: Pre-procedure Verification/Time Out 13:35 13:35 N/A Taken: Lidocaine 4% Topical Solution Lidocaine 4% Topical Solution N/A Pain Control: [employer] N/A Tissue Debrided: Non-Viable Tissue Non-Viable Tissue N/A Level: 0.66 0.01 N/A Debridement A (sq cm): rea Curette Curette N/A Instrument: Minimum Minimum N/A Bleeding: Pressure Pressure N/A Hemostasis A chieved: Procedure was tolerated well Procedure was tolerated well N/A Debridement Treatment Response: 0.7x1.2x0.1 0.1x0.1x0.1 N/A Post Debridement Measurements L x W x D (cm) 0.066 0.001 N/A Post Debridement Volume: (cm) Excoriation: No No Abnormalities Noted N/A Periwound Skin Texture: Induration: No Callus: No Crepitus: No Rash: No Scarring: No Maceration: No No Abnormalities Noted N/A Periwound Skin Moisture: Dry/Scaly: No Rubor: Yes No Abnormalities Noted N/A Periwound Skin Color: Atrophie Blanche: No Cyanosis: No Ecchymosis: No Erythema: No Hemosiderin Staining: No Mottled: No Pallor: No No Abnormality No Abnormality N/A Temperature: Compression Therapy Debridement N/A Procedures Performed: Debridement Treatment Notes Electronic Signature(s) Signed: 07/04/2022 1:41:28 PM By: Duanne Guess MD FACS Entered By: Duanne Guess on 07/04/2022  13:41:28 -------------------------------------------------------------------------------- Multi-Disciplinary Care Plan Details Patient Name: Date of Service: [name], [name] 07/04/2022 1:15 PM Medical Record Number: [id number] Patient Account Number: [id number] Date of Birth/Sex: Treating RN: 08-25-1945 (77 y.o. [name] Primary Care Raschelle Wisenbaker: [name] Other Clinician: Referring Katerin Negrete: Treating Syann Cupples/Extender: Elsie Saas Sobieski, [place] [name] ([id number]) [id number] Page 4 of 8 Weeks in Treatment: 17 Active Inactive Wound/Skin Impairment Nursing Diagnoses: Knowledge deficit related to ulceration/compromised skin integrity Goals: Patient/caregiver will verbalize understanding of skin care regimen Date Initiated: 03/01/2022 Target Resolution Date: 08/03/2022 Goal Status: Active Interventions: Assess ulceration(s) every visit Treatment Activities: Skin care regimen initiated : 03/01/2022 Notes: Electronic Signature(s) Signed: 07/04/2022 3:22:44 PM By: [name] Entered By: [name] on 07/04/2022 13:37:44 -------------------------------------------------------------------------------- Pain Assessment Details Patient Name: Date of Service: [name], [name] 07/04/2022 1:15 PM Medical Record Number: [id number] Patient Account Number: [id number] Date of Birth/Sex: Treating RN: 1945-05-29 (77 y.o. [name] Primary Care Hyatt Capobianco: [name] Other Clinician: Referring Davie Sagona: Treating Anthony Tamburo/Extender: [name] in Treatment: 17 Active Problems Location of Pain Severity and Description of Pain Patient Has Paino No Site Locations Rate  the pain. Current Pain Level: 0 Pain Management and Medication Current Pain Management: Electronic Signature(s) Signed: 07/04/2022 3:22:44 PM By: [name] Entered By: [name] on 07/04/2022 13:21:48 [name] ([id number])  [id number] Page 5 of 8 -------------------------------------------------------------------------------- Patient/Caregiver Education Details Patient Name: Date of Service: [name], [name] 4/30/2024andnbsp1:15 PM Medical Record Number: [id number] Patient Account Number: [id number] Date of Birth/Gender: Treating RN: 04/14/1945 (78 y.o. [name] Primary Care Physician: [name] Other Clinician: Referring Physician: Treating Physician/Extender: [name] in Treatment: 17 Education Assessment Education Provided To: Patient Education Topics Provided Wound/Skin Impairment: Methods: Explain/Verbal Responses: Reinforcements needed, State content correctly Electronic Signature(s) Signed: 07/04/2022 3:22:44 PM By: [name] Entered By: [name] on 07/04/2022 13:37:55 -------------------------------------------------------------------------------- Wound Assessment Details Patient Name: Date of Service: [name], [name] 07/04/2022 1:15 PM Medical Record Number: [id number] Patient Account Number: [id number] Date of Birth/Sex: Treating RN: 14-Jun-1945 (77 y.o. [name] Primary Care Shalamar Plourde: [name] Other Clinician: Referring Yon Schiffman: Treating Deitrick Ferreri/Extender: [name] in Treatment: 17 Wound Status Wound Number: 1 Primary Etiology: Abscess Wound Location: Right Achilles Wound Status: Open Wounding Event: Gradually Appeared Comorbid History: Asthma, Hypertension, Received Radiation Date Acquired: 08/30/2021 Weeks Of Treatment: 17 Clustered Wound: No Photos Wound Measurements Length: (cm) 0.7 Width: (cm) 1.2 Depth: (cm) 0.1 Area: (cm) 0.66 Volume: (cm) 0.066 % Reduction in Area: 90.1% % Reduction in Volume: 90.1% Epithelialization: Medium (34-66%) Tunneling: No Undermining: No Wound Description Classification: Full Thickness Without Exposed Support  Structures Wound Margin: Distinct, outline attached Exudate Amount: Medium Exudate Type: Serosanguineous Exudate Color: red, brown [name], [name] ([id number]) Foul Odor After Cleansing: No Slough/Fibrino Yes [id number].pdf Page 6 of 8 Wound Bed Granulation Amount: Large (67-100%) Exposed Structure Granulation Quality: Red, Hyper-granulation Fascia Exposed: No Necrotic Amount: Small (1-33%) Fat Layer (Subcutaneous Tissue) Exposed: Yes Necrotic Quality: Adherent Slough Tendon Exposed: No Muscle Exposed: No Joint Exposed: No Bone Exposed: No Periwound Skin Texture Texture Color No Abnormalities Noted: Yes No Abnormalities Noted: No Atrophie Blanche: No Moisture Cyanosis: No No Abnormalities Noted: Yes Ecchymosis: No Erythema: No Hemosiderin Staining: No Mottled: No Pallor: No Rubor: Yes Temperature / Pain Temperature: No Abnormality Treatment Notes Wound #1 (Achilles) Wound Laterality: Right Cleanser Soap and Water Discharge Instruction: May shower and wash wound with dial antibacterial soap and water prior to dressing change. Wound Cleanser Discharge Instruction: Cleanse the wound with wound cleanser prior to applying a clean dressing using gauze sponges, not tissue or cotton balls. Peri-Wound Care Ketoconazole Cream 2% Discharge Instruction: Apply Ketoconazole as directed Zinc Oxide Ointment 30g tube Discharge Instruction: Apply Zinc Oxide to periwound with each dressing change Sween Lotion (Moisturizing lotion) Discharge Instruction: Apply moisturizing lotion as directed Topical Primary Dressing Maxorb Extra Ag+ Alginate Dressing, 2x2 (in/in) Discharge Instruction: Apply to wound bed as instructed Secondary Dressing Zetuvit Plus 4x8 in Discharge Instruction: Apply over primary dressing as directed. Secured With Compression Wrap ThreePress (3 layer compression wrap) Discharge Instruction: Apply three layer compression as  directed. Netting,Tubular #5 Compression Stockings Add-Ons Electronic Signature(s) Signed: 07/04/2022 3:22:44 PM By: [name] Entered By: [name] on 07/04/2022 13:31:25 -------------------------------------------------------------------------------- Wound Assessment Details Patient Name: Date of Service: [name], [name] 07/04/2022 1:15 PM Medical Record Number: [id number] Patient Account Number: [id number] [name], [name] ([id number]) [id number] Page 7 of 8 Date of Birth/Sex: Treating RN: 01/09/1946 (77 y.o. [name] Primary Care Tylar Merendino: Other Clinician: Willow [name] Referring Sydelle Sherfield: Treating Ambera Fedele/Extender: Margaret Pyle, Lynne Logan  in Treatment: 17 Wound Status Wound Number: 4 Primary Etiology: Lesion Wound Location: Right, Distal, Posterior Lower Leg Wound Status: Open Wounding Event: Gradually Appeared Comorbid History: Asthma, Hypertension, Received Radiation Date Acquired: 06/01/2022 Weeks Of Treatment: 4 Clustered Wound: No Photos Wound Measurements Length: (cm) 0.1 Width: (cm) 0.1 Depth: (cm) 0.1 Area: (cm) 0.008 Volume: (cm) 0.001 % Reduction in Area: 97.5% % Reduction in Volume: 98.4% Epithelialization: Small (1-33%) Tunneling: No Undermining: No Wound Description Classification: Full Thickness Without Exposed Support Structures Wound Margin: Distinct, outline attached Exudate Amount: Medium Exudate Type: Serosanguineous Exudate Color: red, brown Foul Odor After Cleansing: No Slough/Fibrino Yes Wound Bed Granulation Amount: Small (1-33%) Exposed Structure Granulation Quality: Pink Fascia Exposed: No Necrotic Amount: Large (67-100%) Fat Layer (Subcutaneous Tissue) Exposed: Yes Necrotic Quality: Adherent Slough Tendon Exposed: No Muscle Exposed: No Joint Exposed: No Bone Exposed: No Periwound Skin Texture Texture Color No Abnormalities Noted: Yes No Abnormalities Noted:  Yes Moisture Temperature / Pain No Abnormalities Noted: Yes Temperature: No Abnormality Treatment Notes Wound #4 (Lower Leg) Wound Laterality: Right, Posterior, Distal Cleanser Soap and Water Discharge Instruction: May shower and wash wound with dial antibacterial soap and water prior to dressing change. Wound Cleanser Discharge Instruction: Cleanse the wound with wound cleanser prior to applying a clean dressing using gauze sponges, not tissue or cotton balls. Peri-Wound Care Ketoconazole Cream 2% Discharge Instruction: Apply Ketoconazole as directed Zinc Oxide Ointment 30g tube Discharge Instruction: Apply Zinc Oxide to periwound with each dressing change JUNETTE, BERNAT ([id number]) [phone].pdf Page 8 of 8 Sween Lotion (Moisturizing lotion) Discharge Instruction: Apply moisturizing lotion as directed Topical Primary Dressing Maxorb Extra Ag+ Alginate Dressing, 2x2 (in/in) Discharge Instruction: Apply to wound bed as instructed Secondary Dressing Zetuvit Plus 4x8 in Discharge Instruction: Apply over primary dressing as directed. Secured With Compression Wrap ThreePress (3 layer compression wrap) Discharge Instruction: Apply three layer compression as directed. Netting,Tubular #5 Compression Stockings Add-Ons Electronic Signature(s) Signed: 07/04/2022 3:22:44 PM By: [name] Entered By: [name] on 07/04/2022 13:31:56 -------------------------------------------------------------------------------- Vitals Details Patient Name: Date of Service: [name], CACHE DECOURSEY 07/04/2022 1:15 PM Medical Record Number: [id number] Patient Account Number: [id number] Date of Birth/Sex: Treating RN: [birth year] (77 y.o. [name] Primary Care Laquasia Pincus: [name] Other Clinician: Referring Holman Bonsignore: Treating Jayin Derousse/Extender: [name] in Treatment: 17 Vital Signs Time Taken: 13:21 Temperature (F): 98 Height (in):  60 Pulse (bpm): 73 Weight (lbs): 126 Respiratory Rate (breaths/min): 16 Body Mass Index (BMI): 24.6 Blood Pressure (mmHg): 159/72 Reference Range: 80 - 120 mg / dl Electronic Signature(s) Signed: 07/04/2022 3:22:44 PM By: [name] Entered By: [name] on 07/04/2022 13:21:42

## 2022-07-05 NOTE — Progress Notes (Signed)
LAKYSHA, KOSSMAN ([id number]) [id number] Page 1 of 11 Visit Report for 07/04/2022 Chief Complaint Document Details Patient Name: Date of Service: [name], [name] 07/04/2022 1:15 PM Medical Record Number: [id number] Patient Account Number: [id number] Date of Birth/Sex: Treating RN: 09-09-1945 (77 y.o. F) Primary Care Provider: Willow Ora Other Clinician: Referring Provider: Treating Provider/Extender: Cresenciano Genre in Treatment: 17 Information Obtained from: Patient Chief Complaint Patient seen for complaints of Non-Healing Wound. Electronic Signature(s) Signed: 07/04/2022 1:41:36 PM By: Duanne Guess MD FACS Entered By: Duanne Guess on 07/04/2022 13:41:36 -------------------------------------------------------------------------------- Debridement Details Patient Name: Date of Service: [name], [name] 07/04/2022 1:15 PM Medical Record Number: [id number] Patient Account Number: [id number] Date of Birth/Sex: Treating RN: 1945-04-28 (77 y.o. [name] Primary Care Provider: Willow Ora Other Clinician: Referring Provider: Treating Provider/Extender: Cresenciano Genre in Treatment: 17 Debridement Performed for Assessment: Wound #1 Right Achilles Performed By: Physician Duanne Guess, MD Debridement Type: Debridement Level of Consciousness (Pre-procedure): Awake and Alert Pre-procedure Verification/Time Out Yes - 13:35 Taken: Start Time: 13:35 Pain Control: Lidocaine 4% T opical Solution Percent of Wound Bed Debrided: 100% T Area Debrided (cm): otal 0.66 Tissue and other material debrided: Non-Viable, Slough, Slough Level: Non-Viable Tissue Debridement Description: Selective/Open Wound Instrument: Curette Bleeding: Minimum Hemostasis Achieved: Pressure Response to Treatment: Procedure was tolerated well Level of Consciousness (Post- Awake and Alert procedure): Post Debridement Measurements of Total  Wound Length: (cm) 0.7 Width: (cm) 1.2 Depth: (cm) 0.1 Volume: (cm) 0.066 Character of Wound/Ulcer Post Debridement: Improved Post Procedure Diagnosis Same as Pre-procedure Notes scribed for Dr. Lady Gary by Samuella Bruin, RN Electronic Signature(s) Signed: 07/04/2022 3:22:44 PM By: Samuella Bruin Signed: 07/04/2022 3:54:11 PM By: Duanne Guess MD FACS Entered By: Samuella Bruin on 07/04/2022 13:36:21 [name] ([id number]) [id number] Page 2 of 11 -------------------------------------------------------------------------------- Debridement Details Patient Name: Date of Service: [name], [name] 07/04/2022 1:15 PM Medical Record Number: [id number] Patient Account Number: [id number] Date of Birth/Sex: Treating RN: 1945/12/13 (77 y.o. [name] Primary Care Provider: Willow Ora Other Clinician: Referring Provider: Treating Provider/Extender: Cresenciano Genre in Treatment: 17 Debridement Performed for Assessment: Wound #4 Right,Distal,Posterior Lower Leg Performed By: Physician Duanne Guess, MD Debridement Type: Debridement Level of Consciousness (Pre-procedure): Awake and Alert Pre-procedure Verification/Time Out Yes - 13:35 Taken: Start Time: 13:35 Pain Control: Lidocaine 4% T opical Solution Percent of Wound Bed Debrided: 100% T Area Debrided (cm): otal 0.01 Tissue and other material debrided: Non-Viable, Slough, Slough Level: Non-Viable Tissue Debridement Description: Selective/Open Wound Instrument: Curette Bleeding: Minimum Hemostasis Achieved: Pressure Response to Treatment: Procedure was tolerated well Level of Consciousness (Post- Awake and Alert procedure): Post Debridement Measurements of Total Wound Length: (cm) 0.1 Width: (cm) 0.1 Depth: (cm) 0.1 Volume: (cm) 0.001 Character of Wound/Ulcer Post Debridement: Improved Post Procedure Diagnosis Same as Pre-procedure Notes scribed for Dr.  Lady Gary by Samuella Bruin, RN Electronic Signature(s) Signed: 07/04/2022 3:22:44 PM By: Samuella Bruin Signed: 07/04/2022 3:54:11 PM By: Duanne Guess MD FACS Entered By: Samuella Bruin on 07/04/2022 13:36:41 -------------------------------------------------------------------------------- HPI Details Patient Name: Date of Service: [name], [name] 07/04/2022 1:15 PM Medical Record Number: [id number] Patient Account Number: [id number] Date of Birth/Sex: Treating RN: 1945-12-31 (77 y.o. F) Primary Care Provider: Willow Ora Other Clinician: Referring Provider: Treating Provider/Extender: Cresenciano Genre in Treatment: 17 History of Present Illness HPI Description: ADMISSION 03/01/2022 This is a 77 year old otherwise fairly healthy woman (not diabetic, not obese, does not smoke)  who presents to the clinic with an ulcer on her right posterior leg. She has a history of a liposarcoma excised in 1973. The patient is not sure but believes she received radiation therapy to the location. She did have a skin graft. About 6 months ago, she developed a small ulcer in the area that seemed to start as an abscess. She has had an open wound in that area since then. A shave biopsy was taken by a dermatologist that showed inflammation without evidence of malignancy. She has been applying Vaseline to the site along with periwound TCA. ABI in clinic today is 0.94. Due to the failure of the wound to heal properly, she has been referred to the wound care center for further evaluation and management. 03/09/2022: The tissue on the wound is protruding further from the skin surface. It does not have a typical appearance consistent with hypertrophic granulation tissue. I am concerned that it may represent malignancy. 03/17/2022: Fortunately, the biopsy that I took last week was negative for malignancy. It returned as consistent with an ulcer and inflamed granulation tissue. [name], [name]  ([id number]) [id number] Page 3 of 11 She still has hypertrophic granulation tissue at the site and some slough accumulation on the surface. No significant change in the wound dimensions. 03/23/2022: The wound measured smaller today and the hypertrophic granulation tissue is less prominent. Due to the unusual shape of her leg from old surgery, however, there has been some friction from her compressive wrap. 03/31/2022: The wound is slightly smaller today. There is light slough on the surface. There is still some hypertrophic granulation tissue present. 04/12/2022: She has a new wound near the top of her surgical defect. Where the skin rolls over, there has been some friction and abrasion that has resulted in tissue breakdown. There is slough buildup on the surface. The original wound is smaller and there is epithelialization around the borders. There is slough buildup on the surface again. She has been approved for TheraSkin but it is unclear what her financial responsibility would be at this point. We are still working on determining that. 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 04/17/2022: Both posterior leg wounds are smaller today. They both have slough accumulation. She had her Mohs surgery and there is a defect on her anterior tibial surface. I spoke with her dermatologist and we are going to assume care for this wound. There is a layer of rubbery slough on this wound and it appears a little dry. 04/24/2022: All 3 wounds have a thick layer of rubbery slough on them. There is hypertrophic granulation tissue underneath this on both the anterior tibial surface wound and the posterior Achilles wound. She has a wound on her right forearm that was cultured by her dermatologist and grew out heavy Serratia marcescens and moderate Staph aureus. Apparently they are just having her apply topical mupirocin to the site  because she has previously had a reaction to moxifloxacin, which is apparently the antibiotic they wanted to use. She asks if I have any other recommendations. 05/01/2022: All 3 wounds look better this week. The hypertrophic granulation tissue has resolved on her anterior tibia and posterior Achilles wound. The wounds are all smaller. They have some slough accumulation. The moisture balance on the anterior tibial wound is good; the posterior wounds are a bit dry. 05/08/2022: The anterior tibial wound is smaller without any hypertrophic granulation tissue. There is a little bit of slough  buildup. There is more slough on the posterior Achilles wound. The posterior wound under the flap of skin is a little bit macerated. 05/15/2022: The anterior tibial wound is healed. The posterior Achilles wound has heavy slough buildup. The posterior wound under the flap of skin continues to be macerated with slough accumulation. 05/29/2022: We are still dealing with a lot of moisture accumulation under the skin flap. There was an odor coming from the wound today when her dressing was removed. The Achilles wound has slough accumulation once again. 06/05/2022: Unfortunately, she seems to have had an allergic reaction to some component of her dressing, possibly the medium for the TCA. She has broken out on her entire lower leg. There has been more moisture accumulation under the flap of tissue on her posterior leg and a linear ulcer has opened just caudal to the existing ulcer in this site. There is slough on the Achilles wound, but there is also more epithelialization. 06/13/2022: All of the wounds look significantly better this week. We have much better moisture control on the posterior leg near the flap of tissue. There is some slough accumulation on all wound surfaces. Her skin looks less angry this week, as well. 06/20/2022: Both of the small wounds near the skin fold have contracted. There is some slough on the surface. The  initial wound has some thick rubbery slough on the surface but is epithelializing. Her skin is in better condition this week. 06/27/2022: The more proximal wound under the skin fold is closed. The smaller linear wound just distal to this remains open with some slough on the surface. The primary wound measured the same size, but visually it is smaller this week. There is rubbery slough on the surface. Her skin remains in good condition. 07/04/2022: The primary Achilles wound is smaller again this week and is starting to flatten out. There is some slough on the surface. The small linear wound near the apex of her skin graft is closing in nicely with just a little slough on the surface here. Edema control is excellent. Electronic Signature(s) Signed: 07/04/2022 2:06:46 PM By: Duanne Guess MD FACS Entered By: Duanne Guess on 07/04/2022 14:06:46 -------------------------------------------------------------------------------- Physical Exam Details Patient Name: Date of Service: [name], [name] 07/04/2022 1:15 PM Medical Record Number: [id number] Patient Account Number: [id number] Date of Birth/Sex: Treating RN: 1945/07/15 (77 y.o. F) Primary Care Provider: Willow Ora Other Clinician: Referring Provider: Treating Provider/Extender: Cresenciano Genre in Treatment: 17 Constitutional Hypertensive, asymptomatic. . . . no acute distress. Respiratory Normal work of breathing on room air. Notes 07/04/2022: The primary Achilles wound is smaller again this week and is starting to flatten out. There is some slough on the surface. The small linear wound near the apex of her skin graft is closing in nicely with just a little slough on the surface here. Edema control is excellent. Electronic Signature(s) Signed: 07/04/2022 2:07:28 PM By: Duanne Guess MD FACS Entered By: Duanne Guess on 07/04/2022 14:07:28 [name] ([id number]) [id number] Page 4 of  11 -------------------------------------------------------------------------------- Physician Orders Details Patient Name: Date of Service: [name], [name] 07/04/2022 1:15 PM Medical Record Number: [id number] Patient Account Number: [id number] Date of Birth/Sex: Treating RN: 18-Oct-1945 (77 y.o. [name] Primary Care Provider: Willow Ora Other Clinician: Referring Provider: Treating Provider/Extender: Cresenciano Genre in Treatment: 17 Verbal / Phone Orders: No Diagnosis Coding ICD-10 Coding Code Description [id number] Non-pressure chronic ulcer of other part of right lower leg with  fat layer exposed L97.818 Non-pressure chronic ulcer of other part of right lower leg with other specified severity I10 Essential (primary) hypertension Z85.831 Personal history of malignant neoplasm of soft tissue Z92.3 Personal history of irradiation Follow-up Appointments ppointment in 1 week. - Dr. Lady Gary Room 2 Return A Anesthetic (In clinic) Topical Lidocaine 4% applied to wound bed Bathing/ Shower/ Hygiene May shower with protection but do not get wound dressing(s) wet. Protect dressing(s) with water repellant cover (for example, large plastic bag) or a cast cover and may then take shower. - Please do not get leg wraps wet on Right Leg. May use a cast protector on right leg- can purchase from [employer]; Medical supply store; [facility] etc. the cast protector costs approximately $18-$29 Edema Control - Lymphedema / SCD / Other void standing for long periods of time. - Rest and elevate legs throughout the day. A Moisturize legs daily. - Use a lotion or moisturizer for example :Sween,Eucerin;Aquaphor etc. Wound Treatment Wound #1 - Achilles Wound Laterality: Right Cleanser: Soap and Water 1 x Per Week/30 Days Discharge Instructions: May shower and wash wound with dial antibacterial soap and water prior to dressing change. Cleanser: Wound Cleanser 1 x Per Week/30 Days Discharge  Instructions: Cleanse the wound with wound cleanser prior to applying a clean dressing using gauze sponges, not tissue or cotton balls. Peri-Wound Care: Ketoconazole Cream 2% 1 x Per Week/30 Days Discharge Instructions: Apply Ketoconazole as directed Peri-Wound Care: Zinc Oxide Ointment 30g tube 1 x Per Week/30 Days Discharge Instructions: Apply Zinc Oxide to periwound with each dressing change Peri-Wound Care: Sween Lotion (Moisturizing lotion) 1 x Per Week/30 Days Discharge Instructions: Apply moisturizing lotion as directed Prim Dressing: Maxorb Extra Ag+ Alginate Dressing, 2x2 (in/in) 1 x Per Week/30 Days ary Discharge Instructions: Apply to wound bed as instructed Secondary Dressing: Zetuvit Plus 4x8 in 1 x Per Week/30 Days Discharge Instructions: Apply over primary dressing as directed. Compression Wrap: ThreePress (3 layer compression wrap) 1 x Per Week/30 Days Discharge Instructions: Apply three layer compression as directed. Compression Wrap: Netting,Tubular #5 1 x Per Week/30 Days Wound #4 - Lower Leg Wound Laterality: Right, Posterior, Distal Cleanser: Soap and Water 1 x Per Week/30 Days Discharge Instructions: May shower and wash wound with dial antibacterial soap and water prior to dressing change. Cleanser: Wound Cleanser 1 x Per Week/30 Days Discharge Instructions: Cleanse the wound with wound cleanser prior to applying a clean dressing using gauze sponges, not tissue or cotton balls. Peri-Wound Care: Ketoconazole Cream 2% 1 x Per Week/30 Days Discharge Instructions: Apply Ketoconazole as directed Peri-Wound Care: Zinc Oxide Ointment 30g tube 1 x Per Week/30 Days Discharge Instructions: Apply Zinc Oxide to periwound with each dressing change ATIA, HAUPT ([id number]) [phone].pdf Page 5 of 11 Peri-Wound Care: Sween Lotion (Moisturizing lotion) 1 x Per Week/30 Days Discharge Instructions: Apply moisturizing lotion as directed Prim Dressing: Maxorb  Extra Ag+ Alginate Dressing, 2x2 (in/in) 1 x Per Week/30 Days ary Discharge Instructions: Apply to wound bed as instructed Secondary Dressing: Zetuvit Plus 4x8 in 1 x Per Week/30 Days Discharge Instructions: Apply over primary dressing as directed. Compression Wrap: ThreePress (3 layer compression wrap) 1 x Per Week/30 Days Discharge Instructions: Apply three layer compression as directed. Compression Wrap: Netting,Tubular #5 1 x Per Week/30 Days Patient Medications llergies: bee venom protein (honey bee), moxifloxacin, promethazine HCl, balsam [place], thimerosal A Notifications Medication Indication Start End 07/04/2022 lidocaine DOSE topical 4 % cream - cream topical Electronic Signature(s) Signed: 07/04/2022 3:54:11 PM By: Lady Gary,  Victorino Dike MD FACS Entered By: Duanne Guess on 07/04/2022 14:07:41 -------------------------------------------------------------------------------- Problem List Details Patient Name: Date of Service: GIORGIA, WAHLER 07/04/2022 1:15 PM Medical Record Number: [id number] Patient Account Number: [id number] Date of Birth/Sex: Treating RN: Jul 06, 1945 (77 y.o. F) Primary Care Provider: Willow Ora Other Clinician: Referring Provider: Treating Provider/Extender: Cresenciano Genre in Treatment: 17 Active Problems ICD-10 Encounter Code Description Active Date MDM Diagnosis L97.812 Non-pressure chronic ulcer of other part of right lower leg with fat layer 03/01/2022 No Yes exposed L97.818 Non-pressure chronic ulcer of other part of right lower leg with other specified 04/17/2022 No Yes severity I10 Essential (primary) hypertension 03/01/2022 No Yes Z85.831 Personal history of malignant neoplasm of soft tissue 03/01/2022 No Yes Z92.3 Personal history of irradiation 03/01/2022 No Yes Inactive Problems Resolved Problems MIZANI, DILDAY ([id number]) [phone].pdf Page 6 of 11 Electronic Signature(s) Signed: 07/04/2022 1:41:20 PM  By: Duanne Guess MD FACS Entered By: Duanne Guess on 07/04/2022 13:41:20 -------------------------------------------------------------------------------- Progress Note Details Patient Name: Date of Service: [name], [name] 07/04/2022 1:15 PM Medical Record Number: [id number] Patient Account Number: [id number] Date of Birth/Sex: Treating RN: 05-Mar-1946 (77 y.o. F) Primary Care Provider: Willow Ora Other Clinician: Referring Provider: Treating Provider/Extender: Cresenciano Genre in Treatment: 17 Subjective Chief Complaint Information obtained from Patient Patient seen for complaints of Non-Healing Wound. History of Present Illness (HPI) ADMISSION 03/01/2022 This is a 77 year old otherwise fairly healthy woman (not diabetic, not obese, does not smoke) who presents to the clinic with an ulcer on her right posterior leg. She has a history of a liposarcoma excised in 1973. The patient is not sure but believes she received radiation therapy to the location. She did have a skin graft. About 6 months ago, she developed a small ulcer in the area that seemed to start as an abscess. She has had an open wound in that area since then. A shave biopsy was taken by a dermatologist that showed inflammation without evidence of malignancy. She has been applying Vaseline to the site along with periwound TCA. ABI in clinic today is 0.94. Due to the failure of the wound to heal properly, she has been referred to the wound care center for further evaluation and management. 03/09/2022: The tissue on the wound is protruding further from the skin surface. It does not have a typical appearance consistent with hypertrophic granulation tissue. I am concerned that it may represent malignancy. 03/17/2022: Fortunately, the biopsy that I took last week was negative for malignancy. It returned as consistent with an ulcer and inflamed granulation tissue. She still has hypertrophic granulation tissue at  the site and some slough accumulation on the surface. No significant change in the wound dimensions. 03/23/2022: The wound measured smaller today and the hypertrophic granulation tissue is less prominent. Due to the unusual shape of her leg from old surgery, however, there has been some friction from her compressive wrap. 03/31/2022: The wound is slightly smaller today. There is light slough on the surface. There is still some hypertrophic granulation tissue present. 04/12/2022: She has a new wound near the top of her surgical defect. Where the skin rolls over, there has been some friction and abrasion that has resulted in tissue breakdown. There is slough buildup on the surface. The original wound is smaller and there is epithelialization around the borders. There is slough buildup on the surface again. She has been approved for TheraSkin but it is unclear what her financial responsibility would be at  this point. We are still working on determining that. 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 04/17/2022: Both posterior leg wounds are smaller today. They both have slough accumulation. She had her Mohs surgery and there is a defect on her anterior tibial surface. I spoke with her dermatologist and we are going to assume care for this wound. There is a layer of rubbery slough on this wound and it appears a little dry. 04/24/2022: All 3 wounds have a thick layer of rubbery slough on them. There is hypertrophic granulation tissue underneath this on both the anterior tibial surface wound and the posterior Achilles wound. She has a wound on her right forearm that was cultured by her dermatologist and grew out heavy Serratia marcescens and moderate Staph aureus. Apparently they are just having her apply topical mupirocin to the site because she has previously had a reaction to moxifloxacin, which is apparently the antibiotic they wanted to use.  She asks if I have any other recommendations. 05/01/2022: All 3 wounds look better this week. The hypertrophic granulation tissue has resolved on her anterior tibia and posterior Achilles wound. The wounds are all smaller. They have some slough accumulation. The moisture balance on the anterior tibial wound is good; the posterior wounds are a bit dry. 05/08/2022: The anterior tibial wound is smaller without any hypertrophic granulation tissue. There is a little bit of slough buildup. There is more slough on the posterior Achilles wound. The posterior wound under the flap of skin is a little bit macerated. 05/15/2022: The anterior tibial wound is healed. The posterior Achilles wound has heavy slough buildup. The posterior wound under the flap of skin continues to be macerated with slough accumulation. 05/29/2022: We are still dealing with a lot of moisture accumulation under the skin flap. There was an odor coming from the wound today when her dressing was removed. The Achilles wound has slough accumulation once again. 06/05/2022: Unfortunately, she seems to have had an allergic reaction to some component of her dressing, possibly the medium for the TCA. She has broken out on her entire lower leg. There has been more moisture accumulation under the flap of tissue on her posterior leg and a linear ulcer has opened just caudal to the existing ulcer in this site. There is slough on the Achilles wound, but there is also more epithelialization. 06/13/2022: All of the wounds look significantly better this week. We have much better moisture control on the posterior leg near the flap of tissue. There is some slough accumulation on all wound surfaces. Her skin looks less angry this week, as well. 06/20/2022: Both of the small wounds near the skin fold have contracted. There is some slough on the surface. The initial wound has some thick rubbery slough on the surface but is epithelializing. Her skin is in better condition  this week. 06/27/2022: The more proximal wound under the skin fold is closed. The smaller linear wound just distal to this remains open with some slough on the surface. The primary wound measured the same size, but visually it is smaller this week. There is rubbery slough on the surface. Her skin remains in good condition. 07/04/2022: The primary Achilles wound is smaller again this week and is starting to flatten out. There is some slough on the surface. The small linear wound near the apex of her skin graft is closing in nicely with just a little slough on the surface here. Edema control  is excellent. [name], [name] ([id number]) [id number] Page 7 of 11 Patient History Information obtained from Patient. Family History Unknown History. Social History Never smoker, Marital Status - Married, Alcohol Use - Never, Drug Use - No History, Caffeine Use - Never. Medical History Respiratory Patient has history of Asthma - Childhood asthma Cardiovascular Patient has history of Hypertension Oncologic Patient has history of Received Radiation - R/T Liposarcoma in 1972 Hospitalization/Surgery History - Left Breast Lumpectomy;Tonsillectomy ; Squamous cell carcinoma; Hysterectomy;Right Leg liposarcoma. 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 no acute distress. Vitals Time Taken: 1:21 PM, Height: 60 in, Weight: 126 lbs, BMI: 24.6, Temperature: 98 F, Pulse: 73 bpm,  Respiratory Rate: 16 breaths/min, Blood Pressure: 159/72 mmHg. Respiratory Normal work of breathing on room air. General Notes: 07/04/2022: The primary Achilles wound is smaller again this week and is starting to flatten out. There is some slough on the surface. The small linear wound near the apex of her skin graft is closing in nicely with just a little slough on the surface here. Edema control is excellent. Integumentary (Hair, Skin) Wound #1 status is Open. Original cause of wound was Gradually Appeared. The date acquired was: 08/30/2021. The wound has been in treatment 17 weeks. The wound is located on the Right Achilles. The wound measures 0.7cm length x 1.2cm width x 0.1cm depth; 0.66cm^2 area and 0.066cm^3 volume. There is Fat Layer (Subcutaneous Tissue) exposed. There is no tunneling or undermining noted. There is a medium amount of serosanguineous drainage noted. The wound margin is distinct with the outline attached to the wound base. There is large (67-100%) red, hyper - granulation within the wound bed. There is a small (1- 33%) amount of necrotic tissue within the wound bed including Adherent Slough. The periwound skin appearance had no abnormalities noted for texture. The periwound skin appearance had no abnormalities noted for moisture. The periwound skin appearance exhibited: Rubor. The periwound skin appearance did not exhibit: Atrophie Blanche, Cyanosis, Ecchymosis, Hemosiderin Staining, Mottled, Pallor, Erythema. Periwound temperature was noted as No Abnormality. Wound #4 status is Open. Original cause of wound was Gradually Appeared. The date acquired was: 06/01/2022. The wound has been in treatment 4 weeks. The wound is located on the Right,Distal,Posterior Lower Leg. The wound measures 0.1cm length x 0.1cm width x 0.1cm depth; 0.008cm^2 area and 0.001cm^3 volume. There is Fat Layer (Subcutaneous Tissue) exposed. There is no tunneling or undermining noted. There is a medium amount  of serosanguineous drainage noted. The wound margin is distinct with the outline attached to the wound base. There is small (1-33%) pink granulation within the wound bed. There is a large (67-100%) amount of necrotic tissue within the wound bed including Adherent Slough. The periwound skin appearance had no abnormalities noted for texture. The periwound skin appearance had no abnormalities noted for moisture. The periwound skin appearance had no abnormalities noted for color. Periwound temperature was noted as No Abnormality. Assessment Active Problems ICD-10 Non-pressure chronic ulcer of other part of right lower leg with fat layer exposed Non-pressure chronic ulcer of other part of right lower leg with other specified severity Essential (primary) hypertension [name], [name] ([id number])  [phone].pdf Page 8 of 11 Personal history of malignant neoplasm of soft tissue Personal history of irradiation Procedures Wound #1 Pre-procedure diagnosis of Wound #1 is an Abscess located on the Right Achilles . There was a Selective/Open Wound Non-Viable Tissue Debridement with a total area of 0.66 sq cm performed by Duanne Guess, MD. With the following instrument(s): Curette to remove Non-Viable tissue/material. Material removed includes [facility] after achieving pain control using Lidocaine 4% Topical Solution. No specimens were taken. A time out was conducted at 13:35, prior to the start of the procedure. A Minimum amount of bleeding was controlled with Pressure. The procedure was tolerated well. Post Debridement Measurements: 0.7cm length x 1.2cm width x 0.1cm depth; 0.066cm^3 volume. Character of Wound/Ulcer Post Debridement is improved. Post procedure Diagnosis Wound #1: Same as Pre-Procedure General Notes: scribed for Dr. Lady Gary by Samuella Bruin, RN. Pre-procedure diagnosis of Wound #1 is an Abscess located on the Right Achilles . There was a Three Layer Compression  Therapy Procedure by Samuella Bruin, RN. Post procedure Diagnosis Wound #1: Same as Pre-Procedure Wound #4 Pre-procedure diagnosis of Wound #4 is a Lesion located on the Right,Distal,Posterior Lower Leg . There was a Selective/Open Wound Non-Viable Tissue Debridement with a total area of 0.01 sq cm performed by Duanne Guess, MD. With the following instrument(s): Curette to remove Non-Viable tissue/material. Material removed includes [facility] after achieving pain control using Lidocaine 4% Topical Solution. No specimens were taken. A time out was conducted at 13:35, prior to the start of the procedure. A Minimum amount of bleeding was controlled with Pressure. The procedure was tolerated well. Post Debridement Measurements: 0.1cm length x 0.1cm width x 0.1cm depth; 0.001cm^3 volume. Character of Wound/Ulcer Post Debridement is improved. Post procedure Diagnosis Wound #4: Same as Pre-Procedure General Notes: scribed for Dr. Lady Gary by Samuella Bruin, RN. Plan Follow-up Appointments: Return Appointment in 1 week. - Dr. Lady Gary Room 2 Anesthetic: (In clinic) Topical Lidocaine 4% applied to wound bed Bathing/ Shower/ Hygiene: May shower with protection but do not get wound dressing(s) wet. Protect dressing(s) with water repellant cover (for example, large plastic bag) or a cast cover and may then take shower. - Please do not get leg wraps wet on Right Leg. May use a cast protector on right leg- can purchase from [employer]; Medical supply store; [facility] etc. the cast protector costs approximately $18-$29 Edema Control - Lymphedema / SCD / Other: Avoid standing for long periods of time. - Rest and elevate legs throughout the day. Moisturize legs daily. - Use a lotion or moisturizer for example :Sween,Eucerin;Aquaphor etc. The following medication(s) was prescribed: lidocaine topical 4 % cream cream topical was prescribed at facility WOUND #1: - Achilles Wound Laterality: Right Cleanser: Soap and  Water 1 x Per Week/30 Days Discharge Instructions: May shower and wash wound with dial antibacterial soap and water prior to dressing change. Cleanser: Wound Cleanser 1 x Per Week/30 Days Discharge Instructions: Cleanse the wound with wound cleanser prior to applying a clean dressing using gauze sponges, not tissue or cotton balls. Peri-Wound Care: Ketoconazole Cream 2% 1 x Per Week/30 Days Discharge Instructions: Apply Ketoconazole as directed Peri-Wound Care: Zinc Oxide Ointment 30g tube 1 x Per Week/30 Days Discharge Instructions: Apply Zinc Oxide to periwound with each dressing change Peri-Wound Care: Sween Lotion (Moisturizing lotion) 1 x Per Week/30 Days Discharge Instructions: Apply moisturizing lotion as directed Prim Dressing: Maxorb Extra Ag+ Alginate Dressing, 2x2 (in/in) 1 x Per Week/30 Days ary Discharge Instructions: Apply to wound bed as instructed Secondary  Dressing: Zetuvit Plus 4x8 in 1 x Per Week/30 Days Discharge Instructions: Apply over primary dressing as directed. Com pression Wrap: ThreePress (3 layer compression wrap) 1 x Per Week/30 Days Discharge Instructions: Apply three layer compression as directed. Com pression Wrap: Netting,Tubular #5 1 x Per Week/30 Days WOUND #4: - Lower Leg Wound Laterality: Right, Posterior, Distal Cleanser: Soap and Water 1 x Per Week/30 Days Discharge Instructions: May shower and wash wound with dial antibacterial soap and water prior to dressing change. Cleanser: Wound Cleanser 1 x Per Week/30 Days Discharge Instructions: Cleanse the wound with wound cleanser prior to applying a clean dressing using gauze sponges, not tissue or cotton balls. Peri-Wound Care: Ketoconazole Cream 2% 1 x Per Week/30 Days Discharge Instructions: Apply Ketoconazole as directed Peri-Wound Care: Zinc Oxide Ointment 30g tube 1 x Per Week/30 Days Discharge Instructions: Apply Zinc Oxide to periwound with each dressing change Peri-Wound Care: Sween Lotion  (Moisturizing lotion) 1 x Per Week/30 Days Discharge Instructions: Apply moisturizing lotion as directed Prim Dressing: Maxorb Extra Ag+ Alginate Dressing, 2x2 (in/in) 1 x Per Week/30 Days ary Discharge Instructions: Apply to wound bed as instructed Secondary Dressing: Zetuvit Plus 4x8 in 1 x Per Week/30 Days Discharge Instructions: Apply over primary dressing as directed. Com pression Wrap: ThreePress (3 layer compression wrap) 1 x Per Week/30 Days Discharge Instructions: Apply three layer compression as directed. Com pression Wrap: Netting,Tubular #5 1 x Per Week/30 Days [name], HYSLOP ([id number]) [id number] Page 9 of 11 07/04/2022: The primary Achilles wound is smaller again this week and is starting to flatten out. There is some slough on the surface. The small linear wound near the apex of her skin graft is closing in nicely with just a little slough on the surface here. Edema control is excellent. I used a curette to debride slough from both of the wounds. We will continue silver alginate to both sites. We will apply periwound ketoconazole mixed with triamcinolone and zinc oxide. Continue 3 layer compression. Follow-up in 1 week. Electronic Signature(s) Signed: 07/04/2022 2:08:43 PM By: Duanne Guess MD FACS Entered By: Duanne Guess on 07/04/2022 14:08:43 -------------------------------------------------------------------------------- HxROS Details Patient Name: Date of Service: [name], Edyn [name]. 07/04/2022 1:15 PM Medical Record Number: [id number] Patient Account Number: [id number] Date of Birth/Sex: Treating RN: 1945-09-11 (77 y.o. F) Primary Care Provider: Willow Ora Other Clinician: Referring Provider: Treating Provider/Extender: Cresenciano Genre in Treatment: 17 Information Obtained From Patient Eyes Medical History: Past Medical History Notes: Pt. states she has pre-glaucoma Respiratory Medical History: Positive for: Asthma -  Childhood asthma Cardiovascular Medical History: Positive for: Hypertension Past Medical History Notes: Hx: Angio-edema r/t Bee venom; Hyperlipidemia Endocrine Medical History: Past Medical History Notes: Hx: hypothyroidism Immunological Medical History: Past Medical History Notes: Hx: Currently being treated with Immunotherapy (r/t Bee-Venom) Integumentary (Skin) Medical History: Past Medical History Notes: Hx: Urticaria Musculoskeletal Medical History: Past Medical History Notes: Hx: Osteoporosis Oncologic Medical History: Positive for: Received Radiation - R/T Liposarcoma in 1972 Past Medical History Notes: Right Leg Liposarcoma ([address] LACEY, WALLMAN [name] ([id number]) [phone].pdf Page 10 of 11 Squamous cell carcinoma (bilateral legs)-Biopsy taken to rule out squamous cell carcinoma on right arm and right leg (02/24/22), still pending results Immunizations Pneumococcal Vaccine: Received Pneumococcal Vaccination: Yes Received Pneumococcal Vaccination On or After 60th Birthday: Yes Implantable Devices No devices added Hospitalization / Surgery History Type of Hospitalization/Surgery Left Breast Lumpectomy;Tonsillectomy ; Squamous cell carcinoma; Hysterectomy;Right Leg liposarcoma Family and Social History Unknown History: Yes; Never smoker; Marital Status -  Married; Alcohol Use: Never; Drug Use: No History; Caffeine Use: Never; Financial Concerns: No; Food, Clothing or Shelter Needs: No; Support System Lacking: No; Transportation Concerns: No Electronic Signature(s) Signed: 07/04/2022 3:54:11 PM By: Duanne Guess MD FACS Entered By: Duanne Guess on 07/04/2022 14:06:51 -------------------------------------------------------------------------------- SuperBill Details Patient Name: Date of Service: Misty Jacky Kindle 07/04/2022 Medical Record Number: [id number] Patient Account Number: [id number] Date of Birth/Sex: Treating RN: 03/17/1945 (77 y.o.  F) Primary Care Provider: Willow Ora Other Clinician: Referring Provider: Treating Provider/Extender: Elsie Saas Weeks in Treatment: 17 Diagnosis Coding ICD-10 Codes Code Description [phone] Non-pressure chronic ulcer of other part of right lower leg with fat layer exposed L97.818 Non-pressure chronic ulcer of other part of right lower leg with other specified severity I10 Essential (primary) hypertension Z85.831 Personal history of malignant neoplasm of soft tissue Z92.3 Personal history of irradiation Facility Procedures : CPT4 Code: [id number] Description: [phone] - DEBRIDE WOUND 1ST 20 SQ CM OR < ICD-10 Diagnosis Description L97.812 Non-pressure chronic ulcer of other part of right lower leg with fat layer exposed L97.818 Non-pressure chronic ulcer of other part of right lower leg with other  specified s Modifier: everity Quantity: 1 Physician Procedures : CPT4 Code Description Modifier [id number] - WC PHYS LEVEL 3 - EST PT 25 ICD-10 Diagnosis Description L97.812 Non-pressure chronic ulcer of other part of right lower leg with fat layer exposed L97.818 Non-pressure chronic ulcer of other part of right  lower leg with other specified severity Z85.831 Personal history of malignant neoplasm of soft tissue Z92.3 Personal history of irradiation Quantity: 1 : [id number] - WC PHYS DEBR WO ANESTH 20 SQ CM ICD-10 Diagnosis Description L97.812 Non-pressure chronic ulcer of other part of right lower leg with fat layer exposed L97.818 Non-pressure chronic ulcer of other part of right lower leg with other  specified severity Quantity: 1 Electronic Signature(s) ETTA, GASSETT [name] ([id number]) [id number] Page 11 of 11 Signed: 07/04/2022 2:09:22 PM By: Duanne Guess MD FACS Entered By: Duanne Guess on 07/04/2022 14:09:22

## 2022-07-12 ENCOUNTER — Encounter (HOSPITAL_BASED_OUTPATIENT_CLINIC_OR_DEPARTMENT_OTHER): Payer: Medicare HMO | Attending: General Surgery | Admitting: General Surgery

## 2022-07-12 DIAGNOSIS — I10 Essential (primary) hypertension: Secondary | ICD-10-CM | POA: Insufficient documentation

## 2022-07-12 DIAGNOSIS — L988 Other specified disorders of the skin and subcutaneous tissue: Secondary | ICD-10-CM | POA: Diagnosis not present

## 2022-07-12 DIAGNOSIS — E039 Hypothyroidism, unspecified: Secondary | ICD-10-CM | POA: Insufficient documentation

## 2022-07-12 DIAGNOSIS — E785 Hyperlipidemia, unspecified: Secondary | ICD-10-CM | POA: Insufficient documentation

## 2022-07-12 DIAGNOSIS — L97812 Non-pressure chronic ulcer of other part of right lower leg with fat layer exposed: Secondary | ICD-10-CM | POA: Diagnosis not present

## 2022-07-12 DIAGNOSIS — I872 Venous insufficiency (chronic) (peripheral): Secondary | ICD-10-CM | POA: Diagnosis not present

## 2022-07-12 DIAGNOSIS — L97818 Non-pressure chronic ulcer of other part of right lower leg with other specified severity: Secondary | ICD-10-CM | POA: Diagnosis not present

## 2022-07-12 DIAGNOSIS — Z923 Personal history of irradiation: Secondary | ICD-10-CM | POA: Insufficient documentation

## 2022-07-12 DIAGNOSIS — Z91030 Bee allergy status: Secondary | ICD-10-CM | POA: Diagnosis not present

## 2022-07-12 DIAGNOSIS — Z85831 Personal history of malignant neoplasm of soft tissue: Secondary | ICD-10-CM | POA: Diagnosis not present

## 2022-07-12 DIAGNOSIS — L97212 Non-pressure chronic ulcer of right calf with fat layer exposed: Secondary | ICD-10-CM | POA: Diagnosis not present

## 2022-07-12 DIAGNOSIS — L02415 Cutaneous abscess of right lower limb: Secondary | ICD-10-CM | POA: Diagnosis not present

## 2022-07-12 NOTE — Progress Notes (Signed)
Misty, [name] ([id number]) [id number] Page 1 of 10 Visit Report for 07/12/2022 Arrival Information Details Patient Name: Date of Service: [name], [name] 07/12/2022 12:45 PM Medical Record Number: [id number] Patient Account Number: [id number] Date of Birth/Sex: Treating RN: 05/03/1945 (77 y.o. [name] Primary Care Kindrick Lankford: Willow Ora Other Clinician: Referring Ramesses Crampton: Treating Gazelle Towe/Extender: Cresenciano Genre in Treatment: 19 Visit Information History Since Last Visit Added or deleted any medications: No Patient Arrived: Ambulatory Any new allergies or adverse reactions: No Arrival Time: 12:55 Had a fall or experienced change in No Accompanied By: self activities of daily living that may affect Transfer Assistance: None risk of falls: Patient Identification Verified: Yes Signs or symptoms of abuse/neglect since last visito No Secondary Verification Process Completed: Yes Hospitalized since last visit: No Patient Requires Transmission-Based Precautions: No Implantable device outside of the clinic excluding No Patient Has Alerts: No cellular tissue based products placed in the center since last visit: Has Dressing in Place as Prescribed: Yes Has Compression in Place as Prescribed: Yes Pain Present Now: No Electronic Signature(s) Signed: 07/12/2022 3:33:12 PM By: Samuella Bruin Entered By: Samuella Bruin on 07/12/2022 12:56:29 -------------------------------------------------------------------------------- Compression Therapy Details Patient Name: Date of Service: [name], FERRARI ZEHM 07/12/2022 12:45 PM Medical Record Number: [id number] Patient Account Number: [id number] Date of Birth/Sex: Treating RN: 08/25/1945 (77 y.o. [name] Primary Care Shanitha Twining: Willow Ora Other Clinician: Referring Daija Routson: Treating Kasai Beltran/Extender: Cresenciano Genre in Treatment: 19 Compression Therapy Performed  for Wound Assessment: Wound #1 Right Achilles Performed By: Clinician Samuella Bruin, RN Compression Type: Three Layer Post Procedure Diagnosis Same as Pre-procedure Electronic Signature(s) Signed: 07/12/2022 3:33:12 PM By: Samuella Bruin Entered By: Samuella Bruin on 07/12/2022 13:10:10 -------------------------------------------------------------------------------- Encounter Discharge Information Details Patient Name: Date of Service: [name], [name] 07/12/2022 12:45 PM Medical Record Number: [id number] Patient Account Number: [id number] Date of Birth/Sex: Treating RN: 02-07-1946 (77 y.o. [name] Primary Care Sylvania Moss: Willow Ora Other Clinician: Referring Khristopher Kapaun: Treating Shawnte Demarest/Extender: Cresenciano Genre in Treatment: 19 Encounter Discharge Information Items Post Procedure Vitals Discharge Condition: Stable Temperature (F): 97.9 Ambulatory Status: Ambulatory Pulse (bpm): 72 Discharge Destination: Home Respiratory Rate (breaths/min): 18 Transportation: Private Auto Blood Pressure (mmHg): 170/81 KLARE, DOKES ([id number]) [phone].pdf Page 2 of 10 Accompanied By: self Schedule Follow-up Appointment: Yes Clinical Summary of Care: Patient Declined Electronic Signature(s) Signed: 07/12/2022 3:33:12 PM By: Gelene Mink By: Samuella Bruin on 07/12/2022 13:47:21 -------------------------------------------------------------------------------- Lower Extremity Assessment Details Patient Name: Date of Service: [name], Misty 07/12/2022 12:45 PM Medical Record Number: [id number] Patient Account Number: [id number] Date of Birth/Sex: Treating RN: 1945-06-10 (77 y.o. [name] Primary Care Agam Tuohy: Willow Ora Other Clinician: Referring Hiroko Tregre: Treating Khaden Gater/Extender: Cresenciano Genre in Treatment: 19 Edema Assessment Assessed: [Left: No] [Right: No] Edema: [Left: Ye]  [Right: s] Calf Left: Right: Point of Measurement: 33 cm From Medial Instep 30 cm Ankle Left: Right: Point of Measurement: 9 cm From Medial Instep 19 cm Vascular Assessment Pulses: Dorsalis Pedis Palpable: [Right:Yes] Electronic Signature(s) Signed: 07/12/2022 3:33:12 PM By: Samuella Bruin Entered By: Samuella Bruin on 07/12/2022 13:03:02 -------------------------------------------------------------------------------- Multi Wound Chart Details Patient Name: Date of Service: Misty Misty Anis V. 07/12/2022 12:45 PM Medical Record Number: [id number] Patient Account Number: [id number] Date of Birth/Sex: Treating RN: 1946/01/14 (77 y.o. F) Primary Care Aayushi Solorzano: Willow Ora Other Clinician: Referring Triva Hueber: Treating Graysyn Bache/Extender: Cresenciano Genre in Treatment: 19 Vital Signs  Height(in): 60 Pulse(bpm): 72 Weight(lbs): 126 Blood Pressure(mmHg): 170/81 Body Mass Index(BMI): 24.6 Temperature(F): 97.9 Respiratory Rate(breaths/min): 18 [1:Photos:] [2R:No Photos] [[id number] Page 3 of 10] Right Achilles Right, Posterior Lower Leg Right, Distal, Posterior Lower Leg Wound Location: Gradually Appeared Shear/Friction Gradually Appeared Wounding Event: Abscess Venous Leg Ulcer Lesion Primary Etiology: Asthma, Hypertension, Received Asthma, Hypertension, Received Asthma, Hypertension, Received Comorbid History: Radiation Radiation Radiation 08/30/2021 04/08/2022 06/01/2022 Date Acquired: 19 13 5  Weeks of Treatment: Open Healed - Epithelialized Open Wound Status: No Yes No Wound Recurrence: 0.5x1x0.2 0x0x0 0.1x0.1x0.1 Measurements L x W x D (cm) 0.393 0 0.008 A (cm) : rea 0.079 0 0.001 Volume (cm) : 94.10% 100.00% 97.50% % Reduction in A rea: 88.20% 100.00% 98.40% % Reduction in Volume: Full Thickness Without Exposed Full Thickness Without Exposed Full Thickness Without Exposed Classification: Support Structures Support  Structures Support Structures Medium None Present None Present Exudate A mount: Serosanguineous N/A N/A Exudate Type: red, brown N/A N/A Exudate Color: Distinct, outline attached Distinct, outline attached Distinct, outline attached Wound Margin: Medium (34-66%) None Present (0%) None Present (0%) Granulation A mount: Red, Hyper-granulation N/A N/A Granulation Quality: Medium (34-66%) None Present (0%) Large (67-100%) Necrotic A mount: Adherent Slough N/A Eschar Necrotic Tissue: Fat Layer (Subcutaneous Tissue): Yes Fascia: No Fat Layer (Subcutaneous Tissue): Yes Exposed Structures: Fascia: No Fat Layer (Subcutaneous Tissue): No Fascia: No Tendon: No Tendon: No Tendon: No Muscle: No Muscle: No Muscle: No Joint: No Joint: No Joint: No Bone: No Bone: No Bone: No Medium (34-66%) Large (67-100%) Large (67-100%) Epithelialization: Debridement - Selective/Open Wound N/A Debridement - Selective/Open Wound Debridement: Pre-procedure Verification/Time Out 13:10 N/A 13:10 Taken: Lidocaine 4% Topical Solution N/A Lidocaine 4% Topical Solution Pain Control: Slough N/A Necrotic/Eschar Tissue Debrided: Non-Viable Tissue N/A Non-Viable Tissue Level: 0.39 N/A 0.01 Debridement A (sq cm): rea Curette N/A Curette Instrument: Minimum N/A Minimum Bleeding: Pressure N/A Pressure Hemostasis A chieved: Procedure was tolerated well N/A Procedure was tolerated well Debridement Treatment Response: 0.5x1x0.2 N/A 0.1x0.1x0.1 Post Debridement Measurements L x W x D (cm) 0.079 N/A 0.001 Post Debridement Volume: (cm) Excoriation: No Excoriation: No No Abnormalities Noted Periwound Skin Texture: Induration: No Induration: No Callus: No Callus: No Crepitus: No Crepitus: No Rash: No Rash: No Scarring: No Scarring: No Maceration: No Maceration: No No Abnormalities Noted Periwound Skin Moisture: Dry/Scaly: No Dry/Scaly: No Rubor: Yes Atrophie Blanche: No No Abnormalities  Noted Periwound Skin Color: Atrophie Blanche: No Cyanosis: No Cyanosis: No Ecchymosis: No Ecchymosis: No Erythema: No Erythema: No Hemosiderin Staining: No Hemosiderin Staining: No Mottled: No Mottled: No Pallor: No Pallor: No Rubor: No No Abnormality No Abnormality No Abnormality Temperature: Compression Therapy N/A Debridement Procedures Performed: Debridement Treatment Notes Wound #1 (Achilles) Wound Laterality: Right Cleanser Soap and Water Discharge Instruction: May shower and wash wound with dial antibacterial soap and water prior to dressing change. Wound Cleanser Discharge Instruction: Cleanse the wound with wound cleanser prior to applying a clean dressing using gauze sponges, not tissue or cotton balls. Peri-Wound Care Ketoconazole Cream 2% Discharge Instruction: Apply Ketoconazole as directed Zinc Oxide Ointment 30g tube Discharge Instruction: Apply Zinc Oxide to periwound with each dressing change CONSTANCIA, KARCHER ([id number]) [phone].pdf Page 4 of 10 Sween Lotion (Moisturizing lotion) Discharge Instruction: Apply moisturizing lotion as directed Topical Primary Dressing Maxorb Extra Ag+ Alginate Dressing, 2x2 (in/in) Discharge Instruction: Apply to wound bed as instructed Secondary Dressing Zetuvit Plus 4x8 in Discharge Instruction: Apply over primary dressing as directed. Secured With Compression Wrap ThreePress (3 layer compression wrap) Discharge  Instruction: Apply three layer compression as directed. Netting,Tubular #5 Compression Stockings Add-Ons Wound #4 (Lower Leg) Wound Laterality: Right, Posterior, Distal Cleanser Soap and Water Discharge Instruction: May shower and wash wound with dial antibacterial soap and water prior to dressing change. Wound Cleanser Discharge Instruction: Cleanse the wound with wound cleanser prior to applying a clean dressing using gauze sponges, not tissue or cotton balls. Peri-Wound  Care Ketoconazole Cream 2% Discharge Instruction: Apply Ketoconazole as directed Zinc Oxide Ointment 30g tube Discharge Instruction: Apply Zinc Oxide to periwound with each dressing change Sween Lotion (Moisturizing lotion) Discharge Instruction: Apply moisturizing lotion as directed Topical Primary Dressing Maxorb Extra Ag+ Alginate Dressing, 2x2 (in/in) Discharge Instruction: Apply to wound bed as instructed Secondary Dressing Zetuvit Plus 4x8 in Discharge Instruction: Apply over primary dressing as directed. Secured With Compression Wrap ThreePress (3 layer compression wrap) Discharge Instruction: Apply three layer compression as directed. Netting,Tubular #5 Compression Stockings Facilities manager) Signed: 07/12/2022 1:54:41 PM By: Duanne Guess MD FACS Entered By: Duanne Guess on 07/12/2022 13:54:41 -------------------------------------------------------------------------------- Multi-Disciplinary Care Plan Details Patient Name: Date of Service: [name], KALLIYAN GUNNETT 07/12/2022 12:45 PM Medical Record Number: [id number] Patient Account Number: [id number] Date of Birth/Sex: Treating RN: 31-Mar-1945 (77 y.o. [name] Primary Care Bryant Lipps: Willow Ora Other Clinician: AJADA, GHALI ([id number]) [id number] Page 5 of 10 Referring Eason Housman: Treating Emory Gallentine/Extender: Margaret Pyle, Lynne Logan in Treatment: 19 Active Inactive Wound/Skin Impairment Nursing Diagnoses: Knowledge deficit related to ulceration/compromised skin integrity Goals: Patient/caregiver will verbalize understanding of skin care regimen Date Initiated: 03/01/2022 Target Resolution Date: 08/03/2022 Goal Status: Active Interventions: Assess ulceration(s) every visit Treatment Activities: Skin care regimen initiated : 03/01/2022 Notes: Electronic Signature(s) Signed: 07/12/2022 3:33:12 PM By: Samuella Bruin Entered By: Samuella Bruin on  07/12/2022 13:09:08 -------------------------------------------------------------------------------- Pain Assessment Details Patient Name: Date of Service: Misty DAMICA, CUNNING 07/12/2022 12:45 PM Medical Record Number: [id number] Patient Account Number: [id number] Date of Birth/Sex: Treating RN: [birth year] (77 y.o. [name] Primary Care Amerigo Mcglory: Willow Ora Other Clinician: Referring Karisha Marlin: Treating Amiir Heckard/Extender: Cresenciano Genre in Treatment: 19 Active Problems Location of Pain Severity and Description of Pain Patient Has Paino No Site Locations Rate the pain. Current Pain Level: 0 Pain Management and Medication Current Pain Management: Electronic Signature(s) Signed: 07/12/2022 3:33:12 PM By: Samuella Bruin Entered By: Samuella Bruin on 07/12/2022 12:57:19 [name] ([id number]) [id number] Page 6 of 10 -------------------------------------------------------------------------------- Patient/Caregiver Education Details Patient Name: Date of Service: Misty NADIN, PESCHKE 5/8/2024andnbsp12:45 PM Medical Record Number: [id number] Patient Account Number: [id number] Date of Birth/Gender: Treating RN: Jun 25, 1945 (78 y.o. [name] Primary Care Physician: Willow Ora Other Clinician: Referring Physician: Treating Physician/Extender: Cresenciano Genre in Treatment: 19 Education Assessment Education Provided To: Patient Education Topics Provided Wound/Skin Impairment: Methods: Explain/Verbal Responses: Reinforcements needed, State content correctly Electronic Signature(s) Signed: 07/12/2022 3:33:12 PM By: Samuella Bruin Entered By: Samuella Bruin on 07/12/2022 13:09:21 -------------------------------------------------------------------------------- Wound Assessment Details Patient Name: Date of Service: Misty CANESHIA, GOUGHNOUR 07/12/2022 12:45 PM Medical Record Number: [id number] Patient Account  Number: [id number] Date of Birth/Sex: Treating RN: [birth year] (77 y.o. [name] Primary Care Kelly Eisler: Willow Ora Other Clinician: Referring Dustyn Armbrister: Treating Asherah Lavoy/Extender: Elsie Saas Weeks in Treatment: 19 Wound Status Wound Number: 1 Primary Etiology: Abscess Wound Location: Right Achilles Wound Status: Open Wounding Event: Gradually Appeared Comorbid History: Asthma, Hypertension, Received Radiation Date Acquired: 08/30/2021 Weeks Of Treatment: 19 Clustered Wound: No Photos Wound Measurements Length: (cm)  0.5 Width: (cm) 1 Depth: (cm) 0.2 Area: (cm) 0.393 Volume: (cm) 0.079 % Reduction in Area: 94.1% % Reduction in Volume: 88.2% Epithelialization: Medium (34-66%) Tunneling: No Undermining: No Wound Description Classification: Full Thickness Without Exposed Support Structures Wound Margin: Distinct, outline attached Exudate Amount: Medium Exudate Type: Serosanguineous Dunbar, Iretha V ([id number]) Exudate Color: red, brown Foul Odor After Cleansing: No Slough/Fibrino Yes [id number] Page 7 of 10 Wound Bed Granulation Amount: Medium (34-66%) Exposed Structure Granulation Quality: Red, Hyper-granulation Fascia Exposed: No Necrotic Amount: Medium (34-66%) Fat Layer (Subcutaneous Tissue) Exposed: Yes Necrotic Quality: Adherent Slough Tendon Exposed: No Muscle Exposed: No Joint Exposed: No Bone Exposed: No Periwound Skin Texture Texture Color No Abnormalities Noted: Yes No Abnormalities Noted: No Atrophie Blanche: No Moisture Cyanosis: No No Abnormalities Noted: Yes Ecchymosis: No Erythema: No Hemosiderin Staining: No Mottled: No Pallor: No Rubor: Yes Temperature / Pain Temperature: No Abnormality Treatment Notes Wound #1 (Achilles) Wound Laterality: Right Cleanser Soap and Water Discharge Instruction: May shower and wash wound with dial antibacterial soap and water prior to dressing  change. Wound Cleanser Discharge Instruction: Cleanse the wound with wound cleanser prior to applying a clean dressing using gauze sponges, not tissue or cotton balls. Peri-Wound Care Ketoconazole Cream 2% Discharge Instruction: Apply Ketoconazole as directed Zinc Oxide Ointment 30g tube Discharge Instruction: Apply Zinc Oxide to periwound with each dressing change Sween Lotion (Moisturizing lotion) Discharge Instruction: Apply moisturizing lotion as directed Topical Primary Dressing Maxorb Extra Ag+ Alginate Dressing, 2x2 (in/in) Discharge Instruction: Apply to wound bed as instructed Secondary Dressing Zetuvit Plus 4x8 in Discharge Instruction: Apply over primary dressing as directed. Secured With Compression Wrap ThreePress (3 layer compression wrap) Discharge Instruction: Apply three layer compression as directed. Netting,Tubular #5 Compression Stockings Add-Ons Electronic Signature(s) Signed: 07/12/2022 3:33:12 PM By: Samuella Bruin Entered By: Samuella Bruin on 07/12/2022 13:06:44 -------------------------------------------------------------------------------- Wound Assessment Details Patient Name: Date of Service: Misty [name], MAYMUNA DIMMITT 07/12/2022 12:45 PM [name] ([id number]) [id number] Page 8 of 10 Medical Record Number: [id number] Patient Account Number: [id number] Date of Birth/Sex: Treating RN: Apr 12, 1945 (77 y.o. [name] Primary Care Geisha Abernathy: Willow Ora Other Clinician: Referring Audel Coakley: Treating Mykenna Viele/Extender: Elsie Saas Weeks in Treatment: 19 Wound Status Wound Number: 2R Primary Venous Leg Ulcer Etiology: Wound Location: Right, Posterior Lower Leg Wound Healed - Epithelialized Wounding Event: Shear/Friction Status: Date Acquired: 04/08/2022 Notes: Pt states she started feeling a little sensation under her wrap Weeks Of Treatment: 13 and now has a new place Clustered Wound: No Comorbid  Asthma, Hypertension, Received Radiation History: Wound Measurements Length: (cm) Width: (cm) Depth: (cm) Area: (cm) Volume: (cm) 0 % Reduction in Area: 100% 0 % Reduction in Volume: 100% 0 Epithelialization: Large (67-100%) 0 Tunneling: No 0 Undermining: No Wound Description Classification: Full Thickness Without Exposed Support Structures Wound Margin: Distinct, outline attached Exudate Amount: None Present Foul Odor After Cleansing: No Slough/Fibrino No Wound Bed Granulation Amount: None Present (0%) Exposed Structure Necrotic Amount: None Present (0%) Fascia Exposed: No Fat Layer (Subcutaneous Tissue) Exposed: No Tendon Exposed: No Muscle Exposed: No Joint Exposed: No Bone Exposed: No Periwound Skin Texture Texture Color No Abnormalities Noted: Yes No Abnormalities Noted: No Atrophie Blanche: No Moisture Cyanosis: No No Abnormalities Noted: Yes Ecchymosis: No Erythema: No Hemosiderin Staining: No Mottled: No Pallor: No Rubor: No Temperature / Pain Temperature: No Abnormality Electronic Signature(s) Signed: 07/12/2022 3:33:12 PM By: Samuella Bruin Entered By: Samuella Bruin on 07/12/2022 13:33:43 -------------------------------------------------------------------------------- Wound Assessment Details Patient Name: Date of Service: Misty  [name], Ruchi V. 07/12/2022 12:45 PM Medical Record Number: [id number] Patient Account Number: [id number] Date of Birth/Sex: Treating RN: Sep 13, 1945 (77 y.o. [name] Primary Care Siana Panameno: Willow Ora Other Clinician: Referring Elexia Friedt: Treating Raysa Bosak/Extender: Elsie Saas Weeks in Treatment: 19 Wound Status Wound Number: 4 Primary Etiology: Lesion Wound Location: Right, Distal, Posterior Lower Leg Wound Status: Open Wounding Event: Gradually Appeared Comorbid History: Asthma, Hypertension, Received Radiation Date Acquired: 06/01/2022 [facility] Of Treatment: [id number] V ([id number])  [phone] Page 9 of 10 Clustered Wound: No Photos Wound Measurements Length: (cm) 0.1 Width: (cm) 0.1 Depth: (cm) 0.1 Area: (cm) 0.008 Volume: (cm) 0.001 % Reduction in Area: 97.5% % Reduction in Volume: 98.4% Epithelialization: Large (67-100%) Tunneling: No Undermining: No Wound Description Classification: Full Thickness Without Exposed Support Structures Wound Margin: Distinct, outline attached Exudate Amount: None Present Foul Odor After Cleansing: No Slough/Fibrino No Wound Bed Granulation Amount: None Present (0%) Exposed Structure Necrotic Amount: Large (67-100%) Fascia Exposed: No Necrotic Quality: Eschar Fat Layer (Subcutaneous Tissue) Exposed: Yes Tendon Exposed: No Muscle Exposed: No Joint Exposed: No Bone Exposed: No Periwound Skin Texture Texture Color No Abnormalities Noted: Yes No Abnormalities Noted: Yes Moisture Temperature / Pain No Abnormalities Noted: Yes Temperature: No Abnormality Treatment Notes Wound #4 (Lower Leg) Wound Laterality: Right, Posterior, Distal Cleanser Soap and Water Discharge Instruction: May shower and wash wound with dial antibacterial soap and water prior to dressing change. Wound Cleanser Discharge Instruction: Cleanse the wound with wound cleanser prior to applying a clean dressing using gauze sponges, not tissue or cotton balls. Peri-Wound Care Ketoconazole Cream 2% Discharge Instruction: Apply Ketoconazole as directed Zinc Oxide Ointment 30g tube Discharge Instruction: Apply Zinc Oxide to periwound with each dressing change Sween Lotion (Moisturizing lotion) Discharge Instruction: Apply moisturizing lotion as directed Topical Primary Dressing Maxorb Extra Ag+ Alginate Dressing, 2x2 (in/in) Discharge Instruction: Apply to wound bed as instructed Secondary Dressing Zetuvit Plus 4x8 in Discharge Instruction: Apply over primary dressing as directed. Secured With [place], Ericka Pontiff ([id number])  [id number] Page 10 of 10 Compression Wrap ThreePress (3 layer compression wrap) Discharge Instruction: Apply three layer compression as directed. Netting,Tubular #5 Compression Stockings Add-Ons Electronic Signature(s) Signed: 07/12/2022 3:33:12 PM By: Samuella Bruin Entered By: Samuella Bruin on 07/12/2022 13:07:06 -------------------------------------------------------------------------------- Vitals Details Patient Name: Date of Service: [name], Rigby V. 07/12/2022 12:45 PM Medical Record Number: [id number] Patient Account Number: [id number] Date of Birth/Sex: Treating RN: 1945-07-22 (77 y.o. [name] Primary Care Tashala Cumbo: Willow Ora Other Clinician: Referring Jadasia Haws: Treating Bernise Sylvain/Extender: Cresenciano Genre in Treatment: 19 Vital Signs Time Taken: 12:56 Temperature (F): 97.9 Height (in): 60 Pulse (bpm): 72 Weight (lbs): 126 Respiratory Rate (breaths/min): 18 Body Mass Index (BMI): 24.6 Blood Pressure (mmHg): 170/81 Reference Range: 80 - 120 mg / dl Electronic Signature(s) Signed: 07/12/2022 3:33:12 PM By: Samuella Bruin Entered By: Samuella Bruin on 07/12/2022 12:57:14

## 2022-07-12 NOTE — Progress Notes (Signed)
[name], [name] ([id number]) [id number] Page 1 of 11 Visit Report for 07/12/2022 Chief Complaint Document Details Patient Name: Date of Service: [name] 07/12/2022 12:45 PM Medical Record Number: [id number] Patient Account Number: [id number] Date of Birth/Sex: Treating RN: 25-Mar-1945 (77 y.o. F) Primary Care Provider: Willow [name] Other Clinician: Referring Provider: Treating Provider/Extender: [name] in Treatment: 19 Information Obtained from: Patient Chief Complaint Patient seen for complaints of Non-Healing Wound. Electronic Signature(s) Signed: 07/12/2022 1:54:52 PM By: Misty Guess MD FACS Entered By: [name] on 07/12/2022 13:54:52 -------------------------------------------------------------------------------- Debridement Details Patient Name: Date of Service: [name], [name] 07/12/2022 12:45 PM Medical Record Number: [id number] Patient Account Number: [id number] Date of Birth/Sex: Treating RN: 1945-10-08 (77 y.o. [name] Primary Care Provider: Willow [name] Other Clinician: Referring Provider: Treating Provider/Extender: [name] in Treatment: 19 Debridement Performed for Assessment: Wound #4 Right,Distal,Posterior Lower Leg Performed By: Physician Misty Guess, MD Debridement Type: Debridement Level of Consciousness (Pre-procedure): Awake and Alert Pre-procedure Verification/Time Out Yes - 13:10 Taken: Start Time: 13:10 Pain Control: Lidocaine 4% Topical Solution Percent of Wound Bed Debrided: 100% T Area Debrided (cm): otal 0.01 Tissue and other material debrided: Non-Viable, Eschar Level: Non-Viable Tissue Debridement Description: Selective/Open Wound Instrument: Curette Bleeding: Minimum Hemostasis Achieved: Pressure Response to Treatment: Procedure was tolerated well Level of Consciousness (Post- Awake and Alert procedure): Post Debridement Measurements of  Total Wound Length: (cm) 0.1 Width: (cm) 0.1 Depth: (cm) 0.1 Volume: (cm) 0.001 Character of Wound/Ulcer Post Debridement: Improved Post Procedure Diagnosis Same as Pre-procedure Notes Scribed for Dr. Lady Gary by Samuella Bruin, RN Electronic Signature(s) Signed: 07/12/2022 3:33:12 PM By: Samuella Bruin Signed: 07/12/2022 4:23:55 PM By: Misty Guess MD FACS Entered By: Samuella Bruin on 07/12/2022 13:31:51 [name] ([id number]) [id number] Page 2 of 11 -------------------------------------------------------------------------------- Debridement Details Patient Name: Date of Service: [name], [name] 07/12/2022 12:45 PM Medical Record Number: [id number] Patient Account Number: [id number] Date of Birth/Sex: Treating RN: 07/06/1945 (77 y.o. [name] Primary Care Provider: Willow [name] Other Clinician: Referring Provider: Treating Provider/Extender: [name] in Treatment: 19 Debridement Performed for Assessment: Wound #1 Right Achilles Performed By: Physician Misty Guess, MD Debridement Type: Debridement Level of Consciousness (Pre-procedure): Awake and Alert Pre-procedure Verification/Time Out Yes - 13:10 Taken: Start Time: 13:10 Pain Control: Lidocaine 4% T opical Solution Percent of Wound Bed Debrided: 100% T Area Debrided (cm): otal 0.39 Tissue and other material debrided: Non-Viable, Slough, Slough Level: Non-Viable Tissue Debridement Description: Selective/Open Wound Instrument: Curette Bleeding: Minimum Hemostasis Achieved: Pressure Response to Treatment: Procedure was tolerated well Level of Consciousness (Post- Awake and Alert procedure): Post Debridement Measurements of Total Wound Length: (cm) 0.5 Width: (cm) 1 Depth: (cm) 0.2 Volume: (cm) 0.079 Character of Wound/Ulcer Post Debridement: Improved Post Procedure Diagnosis Same as Pre-procedure Notes scribed for Dr. Lady Gary by  Samuella Bruin, RN Electronic Signature(s) Signed: 07/12/2022 3:33:12 PM By: Samuella Bruin Signed: 07/12/2022 4:23:55 PM By: Misty Guess MD FACS Entered By: Samuella Bruin on 07/12/2022 13:32:39 -------------------------------------------------------------------------------- HPI Details Patient Name: Date of Service: [name], [name] 07/12/2022 12:45 PM Medical Record Number: [id number] Patient Account Number: [id number] Date of Birth/Sex: Treating RN: 07/17/1945 (77 y.o. F) Primary Care Provider: Willow [name] Other Clinician: Referring Provider: Treating Provider/Extender: [name] in Treatment: 19 History of Present Illness HPI Description: ADMISSION 03/01/2022 This is a 77 year old otherwise fairly healthy woman (not diabetic, not obese, does not smoke) who presents  to the clinic with an ulcer on her right posterior leg. She has a history of a liposarcoma excised in 1973. The patient is not sure but believes she received radiation therapy to the location. She did have a skin graft. About 6 months ago, she developed a small ulcer in the area that seemed to start as an abscess. She has had an open wound in that area since then. A shave biopsy was taken by a dermatologist that showed inflammation without evidence of malignancy. She has been applying Vaseline to the site along with periwound TCA. ABI in clinic today is 0.94. Due to the failure of the wound to heal properly, she has been referred to the wound care center for further evaluation and management. 03/09/2022: The tissue on the wound is protruding further from the skin surface. It does not have a typical appearance consistent with hypertrophic granulation tissue. I am concerned that it may represent malignancy. 03/17/2022: Fortunately, the biopsy that I took last week was negative for malignancy. It returned as consistent with an ulcer and inflamed granulation tissue. OSA, SCHRAG ([id number])  [id number] Page 3 of 11 She still has hypertrophic granulation tissue at the site and some slough accumulation on the surface. No significant change in the wound dimensions. 03/23/2022: The wound measured smaller today and the hypertrophic granulation tissue is less prominent. Due to the unusual shape of her leg from old surgery, however, there has been some friction from her compressive wrap. 03/31/2022: The wound is slightly smaller today. There is light slough on the surface. There is still some hypertrophic granulation tissue present. 04/12/2022: She has a new wound near the top of her surgical defect. Where the skin rolls over, there has been some friction and abrasion that has resulted in tissue breakdown. There is slough buildup on the surface. The original wound is smaller and there is epithelialization around the borders. There is slough buildup on the surface again. She has been approved for TheraSkin but it is unclear what her financial responsibility would be at this point. We are still working on determining that. 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 04/17/2022: Both posterior leg wounds are smaller today. They both have slough accumulation. She had her Mohs surgery and there is a defect on her anterior tibial surface. I spoke with her dermatologist and we are going to assume care for this wound. There is a layer of rubbery slough on this wound and it appears a little dry. 04/24/2022: All 3 wounds have a thick layer of rubbery slough on them. There is hypertrophic granulation tissue underneath this on both the anterior tibial surface wound and the posterior Achilles wound. She has a wound on her right forearm that was cultured by her dermatologist and grew out heavy Serratia marcescens and moderate Staph aureus. Apparently they are just having her apply topical mupirocin to the site because she  has previously had a reaction to moxifloxacin, which is apparently the antibiotic they wanted to use. She asks if I have any other recommendations. 05/01/2022: All 3 wounds look better this week. The hypertrophic granulation tissue has resolved on her anterior tibia and posterior Achilles wound. The wounds are all smaller. They have some slough accumulation. The moisture balance on the anterior tibial wound is good; the posterior wounds are a bit dry. 05/08/2022: The anterior tibial wound is smaller without any hypertrophic granulation tissue. There is a little bit of slough buildup. There  is more slough on the posterior Achilles wound. The posterior wound under the flap of skin is a little bit macerated. 05/15/2022: The anterior tibial wound is healed. The posterior Achilles wound has heavy slough buildup. The posterior wound under the flap of skin continues to be macerated with slough accumulation. 05/29/2022: We are still dealing with a lot of moisture accumulation under the skin flap. There was an odor coming from the wound today when her dressing was removed. The Achilles wound has slough accumulation once again. 06/05/2022: Unfortunately, she seems to have had an allergic reaction to some component of her dressing, possibly the medium for the TCA. She has broken out on her entire lower leg. There has been more moisture accumulation under the flap of tissue on her posterior leg and a linear ulcer has opened just caudal to the existing ulcer in this site. There is slough on the Achilles wound, but there is also more epithelialization. 06/13/2022: All of the wounds look significantly better this week. We have much better moisture control on the posterior leg near the flap of tissue. There is some slough accumulation on all wound surfaces. Her skin looks less angry this week, as well. 06/20/2022: Both of the small wounds near the skin fold have contracted. There is some slough on the surface. The initial  wound has some thick rubbery slough on the surface but is epithelializing. Her skin is in better condition this week. 06/27/2022: The more proximal wound under the skin fold is closed. The smaller linear wound just distal to this remains open with some slough on the surface. The primary wound measured the same size, but visually it is smaller this week. There is rubbery slough on the surface. Her skin remains in good condition. 07/04/2022: The primary Achilles wound is smaller again this week and is starting to flatten out. There is some slough on the surface. The small linear wound near the apex of her skin graft is closing in nicely with just a little slough on the surface here. Edema control is excellent. 07/12/2022: Both sites have contracted; the linear wound at the proximal aspect of her skin graft site is nearly closed. There is some slough and eschar in both locations. Electronic Signature(s) Signed: 07/12/2022 1:55:39 PM By: Misty Guess MD FACS Entered By: [name] on 07/12/2022 13:55:38 -------------------------------------------------------------------------------- Physical Exam Details Patient Name: Date of Service: [name], [name] 07/12/2022 12:45 PM Medical Record Number: [id number] Patient Account Number: [id number] Date of Birth/Sex: Treating RN: [birth year] (77 y.o. F) Primary Care Provider: Willow [name] Other Clinician: Referring Provider: Treating Provider/Extender: [name] in Treatment: 19 Constitutional Hypertensive, asymptomatic. . . . no acute distress. Respiratory Normal work of breathing on room air. Notes 07/12/2022: Both sites have contracted; the linear wound at the proximal aspect of her skin graft site is nearly closed. There is some slough and eschar in both locations. Electronic Signature(s) Signed: 07/12/2022 1:56:38 PM By: Misty Guess MD FACS Entered By: [name] on 07/12/2022 13:56:38 [name] ([id number])  [id number] Page 4 of 11 -------------------------------------------------------------------------------- Physician Orders Details Patient Name: Date of Service: [name], [name] 07/12/2022 12:45 PM Medical Record Number: [id number] Patient Account Number: [id number] Date of Birth/Sex: Treating RN: 08/16/1945 (77 y.o. [name] Primary Care Provider: Willow [name] Other Clinician: Referring Provider: Treating Provider/Extender: [name] in Treatment: 19 Verbal / Phone Orders: No Diagnosis Coding ICD-10 Coding Code Description [phone] Non-pressure chronic ulcer of other part  of right lower leg with fat layer exposed L97.818 Non-pressure chronic ulcer of other part of right lower leg with other specified severity I10 Essential (primary) hypertension Z85.831 Personal history of malignant neoplasm of soft tissue Z92.3 Personal history of irradiation Follow-up Appointments ppointment in 1 week. - Dr. Lady Gary Room 2 Return A Anesthetic (In clinic) Topical Lidocaine 4% applied to wound bed Bathing/ Shower/ Hygiene May shower with protection but do not get wound dressing(s) wet. Protect dressing(s) with water repellant cover (for example, large plastic bag) or a cast cover and may then take shower. - Please do not get leg wraps wet on Right Leg. May use a cast protector on right leg- can purchase from [employer]; Medical supply store; [facility] etc. the cast protector costs approximately $18-$29 Edema Control - Lymphedema / SCD / Other void standing for long periods of time. - Rest and elevate legs throughout the day. A Moisturize legs daily. - Use a lotion or moisturizer for example :Sween,Eucerin;Aquaphor etc. Wound Treatment Wound #1 - Achilles Wound Laterality: Right Cleanser: Soap and Water 1 x Per Week/30 Days Discharge Instructions: May shower and wash wound with dial antibacterial soap and water prior to dressing change. Cleanser:  Wound Cleanser 1 x Per Week/30 Days Discharge Instructions: Cleanse the wound with wound cleanser prior to applying a clean dressing using gauze sponges, not tissue or cotton balls. Peri-Wound Care: Ketoconazole Cream 2% 1 x Per Week/30 Days Discharge Instructions: Apply Ketoconazole as directed Peri-Wound Care: Zinc Oxide Ointment 30g tube 1 x Per Week/30 Days Discharge Instructions: Apply Zinc Oxide to periwound with each dressing change Peri-Wound Care: Sween Lotion (Moisturizing lotion) 1 x Per Week/30 Days Discharge Instructions: Apply moisturizing lotion as directed Prim Dressing: Maxorb Extra Ag+ Alginate Dressing, 2x2 (in/in) 1 x Per Week/30 Days ary Discharge Instructions: Apply to wound bed as instructed Secondary Dressing: Zetuvit Plus 4x8 in 1 x Per Week/30 Days Discharge Instructions: Apply over primary dressing as directed. Compression Wrap: ThreePress (3 layer compression wrap) 1 x Per Week/30 Days Discharge Instructions: Apply three layer compression as directed. Compression Wrap: Netting,Tubular #5 1 x Per Week/30 Days Wound #4 - Lower Leg Wound Laterality: Right, Posterior, Distal Cleanser: Soap and Water 1 x Per Week/30 Days Discharge Instructions: May shower and wash wound with dial antibacterial soap and water prior to dressing change. Cleanser: Wound Cleanser 1 x Per Week/30 Days Discharge Instructions: Cleanse the wound with wound cleanser prior to applying a clean dressing using gauze sponges, not tissue or cotton balls. Peri-Wound Care: Ketoconazole Cream 2% 1 x Per Week/30 Days Discharge Instructions: Apply Ketoconazole as directed [name], VIRGA ([id number]) [phone].pdf Page 5 of 11 Peri-Wound Care: Zinc Oxide Ointment 30g tube 1 x Per Week/30 Days Discharge Instructions: Apply Zinc Oxide to periwound with each dressing change Peri-Wound Care: Sween Lotion (Moisturizing lotion) 1 x Per Week/30 Days Discharge Instructions: Apply  moisturizing lotion as directed Prim Dressing: Maxorb Extra Ag+ Alginate Dressing, 2x2 (in/in) 1 x Per Week/30 Days ary Discharge Instructions: Apply to wound bed as instructed Secondary Dressing: Zetuvit Plus 4x8 in 1 x Per Week/30 Days Discharge Instructions: Apply over primary dressing as directed. Compression Wrap: ThreePress (3 layer compression wrap) 1 x Per Week/30 Days Discharge Instructions: Apply three layer compression as directed. Compression Wrap: Netting,Tubular #5 1 x Per Week/30 Days Patient Medications llergies: bee venom protein (honey bee), moxifloxacin, promethazine HCl, balsam [place], thimerosal A Notifications Medication Indication Start End 07/12/2022 lidocaine DOSE topical 4 % cream - cream topical Electronic Signature(s) Signed:  07/12/2022 4:23:55 PM By: Misty Guess MD FACS Entered By: [name] on 07/12/2022 13:56:52 -------------------------------------------------------------------------------- Problem List Details Patient Name: Date of Service: [name], [name] 07/12/2022 12:45 PM Medical Record Number: [id number] Patient Account Number: [id number] Date of Birth/Sex: Treating RN: 07-05-1945 (77 y.o. F) Primary Care Provider: Willow [name] Other Clinician: Referring Provider: Treating Provider/Extender: [name] in Treatment: 19 Active Problems ICD-10 Encounter Code Description Active Date MDM Diagnosis L97.812 Non-pressure chronic ulcer of other part of right lower leg with fat layer 03/01/2022 No Yes exposed L97.818 Non-pressure chronic ulcer of other part of right lower leg with other specified 04/17/2022 No Yes severity I10 Essential (primary) hypertension 03/01/2022 No Yes Z85.831 Personal history of malignant neoplasm of soft tissue 03/01/2022 No Yes Z92.3 Personal history of irradiation 03/01/2022 No Yes Inactive Problems BRYANDA, SAVITT ([id number]) [phone].pdf Page 6 of 11 Resolved  Problems Electronic Signature(s) Signed: 07/12/2022 1:53:56 PM By: Misty Guess MD FACS Entered By: [name] on 07/12/2022 13:53:55 -------------------------------------------------------------------------------- Progress Note Details Patient Name: Date of Service: [name], [name] 07/12/2022 12:45 PM Medical Record Number: [id number] Patient Account Number: [id number] Date of Birth/Sex: Treating RN: 03-17-1945 (77 y.o. F) Primary Care Provider: Willow [name] Other Clinician: Referring Provider: Treating Provider/Extender: [name] in Treatment: 19 Subjective Chief Complaint Information obtained from Patient Patient seen for complaints of Non-Healing Wound. History of Present Illness (HPI) ADMISSION 03/01/2022 This is a 77 year old otherwise fairly healthy woman (not diabetic, not obese, does not smoke) who presents to the clinic with an ulcer on her right posterior leg. She has a history of a liposarcoma excised in 1973. The patient is not sure but believes she received radiation therapy to the location. She did have a skin graft. About 6 months ago, she developed a small ulcer in the area that seemed to start as an abscess. She has had an open wound in that area since then. A shave biopsy was taken by a dermatologist that showed inflammation without evidence of malignancy. She has been applying Vaseline to the site along with periwound TCA. ABI in clinic today is 0.94. Due to the failure of the wound to heal properly, she has been referred to the wound care center for further evaluation and management. 03/09/2022: The tissue on the wound is protruding further from the skin surface. It does not have a typical appearance consistent with hypertrophic granulation tissue. I am concerned that it may represent malignancy. 03/17/2022: Fortunately, the biopsy that I took last week was negative for malignancy. It returned as consistent with an ulcer and inflamed  granulation tissue. She still has hypertrophic granulation tissue at the site and some slough accumulation on the surface. No significant change in the wound dimensions. 03/23/2022: The wound measured smaller today and the hypertrophic granulation tissue is less prominent. Due to the unusual shape of her leg from old surgery, however, there has been some friction from her compressive wrap. 03/31/2022: The wound is slightly smaller today. There is light slough on the surface. There is still some hypertrophic granulation tissue present. 04/12/2022: She has a new wound near the top of her surgical defect. Where the skin rolls over, there has been some friction and abrasion that has resulted in tissue breakdown. There is slough buildup on the surface. The original wound is smaller and there is epithelialization around the borders. There is slough buildup on the surface again. She has been approved for TheraSkin but it is unclear what her  financial responsibility would be at this point. We are still working on determining that. 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 04/17/2022: Both posterior leg wounds are smaller today. They both have slough accumulation. She had her Mohs surgery and there is a defect on her anterior tibial surface. I spoke with her dermatologist and we are going to assume care for this wound. There is a layer of rubbery slough on this wound and it appears a little dry. 04/24/2022: All 3 wounds have a thick layer of rubbery slough on them. There is hypertrophic granulation tissue underneath this on both the anterior tibial surface wound and the posterior Achilles wound. She has a wound on her right forearm that was cultured by her dermatologist and grew out heavy Serratia marcescens and moderate Staph aureus. Apparently they are just having her apply topical mupirocin to the site because she has previously had a reaction  to moxifloxacin, which is apparently the antibiotic they wanted to use. She asks if I have any other recommendations. 05/01/2022: All 3 wounds look better this week. The hypertrophic granulation tissue has resolved on her anterior tibia and posterior Achilles wound. The wounds are all smaller. They have some slough accumulation. The moisture balance on the anterior tibial wound is good; the posterior wounds are a bit dry. 05/08/2022: The anterior tibial wound is smaller without any hypertrophic granulation tissue. There is a little bit of slough buildup. There is more slough on the posterior Achilles wound. The posterior wound under the flap of skin is a little bit macerated. 05/15/2022: The anterior tibial wound is healed. The posterior Achilles wound has heavy slough buildup. The posterior wound under the flap of skin continues to be macerated with slough accumulation. 05/29/2022: We are still dealing with a lot of moisture accumulation under the skin flap. There was an odor coming from the wound today when her dressing was removed. The Achilles wound has slough accumulation once again. 06/05/2022: Unfortunately, she seems to have had an allergic reaction to some component of her dressing, possibly the medium for the TCA. She has broken out on her entire lower leg. There has been more moisture accumulation under the flap of tissue on her posterior leg and a linear ulcer has opened just caudal to the existing ulcer in this site. There is slough on the Achilles wound, but there is also more epithelialization. 06/13/2022: All of the wounds look significantly better this week. We have much better moisture control on the posterior leg near the flap of tissue. There is some slough accumulation on all wound surfaces. Her skin looks less angry this week, as well. 06/20/2022: Both of the small wounds near the skin fold have contracted. There is some slough on the surface. The initial wound has some thick rubbery  slough on the surface but is epithelializing. Her skin is in better condition this week. 06/27/2022: The more proximal wound under the skin fold is closed. The smaller linear wound just distal to this remains open with some slough on the surface. The primary wound measured the same size, but visually it is smaller this week. There is rubbery slough on the surface. Her skin remains in good condition. [name], [name] ([id number]) [id number] Page 7 of 11 07/04/2022: The primary Achilles wound is smaller again this week and is starting to flatten out. There is some slough on the surface. The small linear wound near the apex of her skin graft is  closing in nicely with just a little slough on the surface here. Edema control is excellent. 07/12/2022: Both sites have contracted; the linear wound at the proximal aspect of her skin graft site is nearly closed. There is some slough and eschar in both locations. Patient History Information obtained from Patient. Family History Unknown History. Social History Never smoker, Marital Status - Married, Alcohol Use - Never, Drug Use - No History, Caffeine Use - Never. Medical History Respiratory Patient has history of Asthma - Childhood asthma Cardiovascular Patient has history of Hypertension Oncologic Patient has history of Received Radiation - R/T Liposarcoma in 1972 Hospitalization/Surgery History - Left Breast Lumpectomy;Tonsillectomy ; Squamous cell carcinoma; Hysterectomy;Right Leg liposarcoma. Medical A Surgical History Notes nd Eyes Pt. 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 no acute distress. Vitals Time Taken: 12:56 PM, Height: 60 in, Weight: 126 lbs, BMI: 24.6, Temperature: 97.9 F, Pulse: 72 bpm, Respiratory Rate: 18 breaths/min, Blood Pressure: 170/81 mmHg. Respiratory Normal work of breathing on room air. General Notes: 07/12/2022: Both sites have contracted; the linear wound at the proximal aspect of her skin graft site is nearly closed. There is some slough and eschar in both locations. Integumentary (Hair, Skin) Wound #1 status is Open. Original cause of wound was Gradually Appeared. The date acquired was: 08/30/2021. The wound has been in treatment 19 weeks. The wound is located on the Right Achilles. The wound measures 0.5cm length x 1cm width x 0.2cm depth; 0.393cm^2 area and 0.079cm^3 volume. There is Fat Layer (Subcutaneous Tissue) exposed. There is no tunneling or undermining noted. There is a medium amount of serosanguineous drainage noted. The wound margin is distinct with the outline attached to the wound base. There is medium (34-66%) red, hyper - granulation within the wound bed. There is a medium (34-66%) amount of necrotic tissue within the wound bed including Adherent Slough. The periwound skin appearance had no abnormalities noted for texture. The periwound skin appearance had no abnormalities noted for moisture. The periwound skin appearance exhibited: Rubor. The periwound skin appearance did not exhibit: Atrophie Blanche, Cyanosis, Ecchymosis, Hemosiderin Staining, Mottled, Pallor, Erythema. Periwound temperature was noted as No Abnormality. Wound #2R status is Healed - Epithelialized. Original cause of wound was Shear/Friction. The date acquired was: 04/08/2022. The wound has been in treatment 13 weeks. The wound is located on the Right,Posterior Lower Leg. The wound measures 0cm length x 0cm width x 0cm depth; 0cm^2 area  and 0cm^3 volume. There is no tunneling or undermining noted. There is a none present amount of drainage noted. The wound margin is distinct with the outline attached to the wound base. There is no granulation within the wound bed. There is no necrotic tissue within the wound bed. The periwound skin appearance had no abnormalities noted for texture. The periwound skin appearance had no abnormalities noted for moisture. The periwound skin appearance did not exhibit: Atrophie Blanche, Cyanosis, Ecchymosis, Hemosiderin Staining, Mottled, Pallor, Rubor, Erythema. Periwound temperature was noted as No Abnormality. Wound #4 status is Open. Original cause of wound was Gradually Appeared. The date acquired was: 06/01/2022. The wound has been in treatment 5 weeks. The wound is located on the Right,Distal,Posterior Lower Leg. The wound  measures 0.1cm length x 0.1cm width x 0.1cm depth; 0.008cm^2 area and 0.001cm^3 volume. There is Fat Layer (Subcutaneous Tissue) exposed. There is no tunneling or undermining noted. There is a none present amount of drainage noted. The wound margin is distinct with the outline attached to the wound base. There is no granulation within the wound bed. There is a large (67-100%) amount of necrotic tissue within the wound bed including Eschar. The periwound skin appearance had no abnormalities noted for texture. The periwound skin appearance [name], [name] ([id number]) [id number] Page 8 of 11 had no abnormalities noted for moisture. The periwound skin appearance had no abnormalities noted for color. Periwound temperature was noted as No Abnormality. Assessment Active Problems ICD-10 Non-pressure chronic ulcer of other part of right lower leg with fat layer exposed Non-pressure chronic ulcer of other part of right lower leg with other specified severity Essential (primary) hypertension Personal history of malignant neoplasm of soft tissue Personal history  of irradiation Procedures Wound #1 Pre-procedure diagnosis of Wound #1 is an Abscess located on the Right Achilles . There was a Selective/Open Wound Non-Viable Tissue Debridement with a total area of 0.39 sq cm performed by Misty Guess, MD. With the following instrument(s): Curette to remove Non-Viable tissue/material. Material removed includes [facility] after achieving pain control using Lidocaine 4% Topical Solution. No specimens were taken. A time out was conducted at 13:10, prior to the start of the procedure. A Minimum amount of bleeding was controlled with Pressure. The procedure was tolerated well. Post Debridement Measurements: 0.5cm length x 1cm width x 0.2cm depth; 0.079cm^3 volume. Character of Wound/Ulcer Post Debridement is improved. Post procedure Diagnosis Wound #1: Same as Pre-Procedure General Notes: scribed for Dr. Lady Gary by Samuella Bruin, RN. Pre-procedure diagnosis of Wound #1 is an Abscess located on the Right Achilles . There was a Three Layer Compression Therapy Procedure by Samuella Bruin, RN. Post procedure Diagnosis Wound #1: Same as Pre-Procedure Wound #4 Pre-procedure diagnosis of Wound #4 is a Lesion located on the Right,Distal,Posterior Lower Leg . There was a Selective/Open Wound Non-Viable Tissue Debridement with a total area of 0.01 sq cm performed by Misty Guess, MD. With the following instrument(s): Curette to remove Non-Viable tissue/material. Material removed includes Eschar after achieving pain control using Lidocaine 4% Topical Solution. No specimens were taken. A time out was conducted at 13:10, prior to the start of the procedure. A Minimum amount of bleeding was controlled with Pressure. The procedure was tolerated well. Post Debridement Measurements: 0.1cm length x 0.1cm width x 0.1cm depth; 0.001cm^3 volume. Character of Wound/Ulcer Post Debridement is improved. Post procedure Diagnosis Wound #4: Same as Pre-Procedure General Notes:  Scribed for Dr. Lady Gary by Samuella Bruin, RN. Plan Follow-up Appointments: Return Appointment in 1 week. - Dr. Lady Gary Room 2 Anesthetic: (In clinic) Topical Lidocaine 4% applied to wound bed Bathing/ Shower/ Hygiene: May shower with protection but do not get wound dressing(s) wet. Protect dressing(s) with water repellant cover (for example, large plastic bag) or a cast cover and may then take shower. - Please do not get leg wraps wet on Right Leg. May use a cast protector on right leg- can purchase from [employer]; Medical supply store; [facility] etc. the cast protector costs approximately $18-$29 Edema Control - Lymphedema / SCD / Other: Avoid standing for long periods of time. - Rest and elevate legs throughout the day. Moisturize legs daily. - Use a lotion or moisturizer for example :Sween,Eucerin;Aquaphor etc. The following medication(s) was prescribed: lidocaine topical 4 % cream cream  topical was prescribed at facility WOUND #1: - Achilles Wound Laterality: Right Cleanser: Soap and Water 1 x Per Week/30 Days Discharge Instructions: May shower and wash wound with dial antibacterial soap and water prior to dressing change. Cleanser: Wound Cleanser 1 x Per Week/30 Days Discharge Instructions: Cleanse the wound with wound cleanser prior to applying a clean dressing using gauze sponges, not tissue or cotton balls. Peri-Wound Care: Ketoconazole Cream 2% 1 x Per Week/30 Days Discharge Instructions: Apply Ketoconazole as directed Peri-Wound Care: Zinc Oxide Ointment 30g tube 1 x Per Week/30 Days Discharge Instructions: Apply Zinc Oxide to periwound with each dressing change Peri-Wound Care: Sween Lotion (Moisturizing lotion) 1 x Per Week/30 Days Discharge Instructions: Apply moisturizing lotion as directed Prim Dressing: Maxorb Extra Ag+ Alginate Dressing, 2x2 (in/in) 1 x Per Week/30 Days ary Discharge Instructions: Apply to wound bed as instructed Secondary Dressing: Zetuvit Plus 4x8 in 1 x Per  Week/30 Days Discharge Instructions: Apply over primary dressing as directed. Com pression Wrap: ThreePress (3 layer compression wrap) 1 x Per Week/30 Days Discharge Instructions: Apply three layer compression as directed. Com pression Wrap: Netting,Tubular #5 1 x Per Week/30 Days WOUND #4: - Lower Leg Wound Laterality: Right, Posterior, Distal Cleanser: Soap and Water 1 x Per Week/30 Days CHARMA, KHOSRAVI ([id number]) [id number] Page 9 of 11 Discharge Instructions: May shower and wash wound with dial antibacterial soap and water prior to dressing change. Cleanser: Wound Cleanser 1 x Per Week/30 Days Discharge Instructions: Cleanse the wound with wound cleanser prior to applying a clean dressing using gauze sponges, not tissue or cotton balls. Peri-Wound Care: Ketoconazole Cream 2% 1 x Per Week/30 Days Discharge Instructions: Apply Ketoconazole as directed Peri-Wound Care: Zinc Oxide Ointment 30g tube 1 x Per Week/30 Days Discharge Instructions: Apply Zinc Oxide to periwound with each dressing change Peri-Wound Care: Sween Lotion (Moisturizing lotion) 1 x Per Week/30 Days Discharge Instructions: Apply moisturizing lotion as directed Prim Dressing: Maxorb Extra Ag+ Alginate Dressing, 2x2 (in/in) 1 x Per Week/30 Days ary Discharge Instructions: Apply to wound bed as instructed Secondary Dressing: Zetuvit Plus 4x8 in 1 x Per Week/30 Days Discharge Instructions: Apply over primary dressing as directed. Com pression Wrap: ThreePress (3 layer compression wrap) 1 x Per Week/30 Days Discharge Instructions: Apply three layer compression as directed. Com pression Wrap: Netting,Tubular #5 1 x Per Week/30 Days 07/12/2022: Both sites have contracted; the linear wound at the proximal aspect of her skin graft site is nearly closed. There is some slough and eschar in both locations. I used a curette to debride the slough and eschar from each of the open sites. We will continue to  apply silver alginate to them, as well as took silver alginate under the fold of her surgical defect to prevent moisture accumulation. Continue topical zinc oxide and ketoconazole to the periwound with 3 layer compression or equivalent. Follow-up in 1 week. Electronic Signature(s) Signed: 07/12/2022 1:58:08 PM By: Misty Guess MD FACS Entered By: [name] on 07/12/2022 13:58:08 -------------------------------------------------------------------------------- HxROS Details Patient Name: Date of Service: [name], Tunisha [name]. 07/12/2022 12:45 PM Medical Record Number: [id number] Patient Account Number: [id number] Date of Birth/Sex: Treating RN: [birth year] (78 y.o. F) Primary Care Provider: Willow [name] Other Clinician: Referring Provider: Treating Provider/Extender: [name] in Treatment: 19 Information Obtained From Patient Eyes Medical History: Past Medical History Notes: Pt. states she has pre-glaucoma Respiratory Medical History: Positive for: Asthma - Childhood asthma Cardiovascular Medical History: Positive for: Hypertension Past Medical History Notes:  Hx: Angio-edema r/t Bee venom; Hyperlipidemia Endocrine Medical History: Past Medical History Notes: Hx: hypothyroidism Immunological Medical History: Past Medical History Notes: Hx: Currently being treated with Immunotherapy (r/t Bee-Venom) Integumentary (Skin) Medical HistoryCADDIE, TORRENCE ([id number]) [phone].pdf Page 10 of 11 Past Medical History Notes: Hx: Urticaria Musculoskeletal Medical History: Past Medical History Notes: Hx: Osteoporosis Oncologic Medical History: Positive for: Received Radiation - R/T Liposarcoma in 1972 Past Medical History Notes: 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)-Biopsy taken to rule out squamous cell carcinoma on right arm and right leg (02/24/22), still pending results Immunizations Pneumococcal  Vaccine: Received Pneumococcal Vaccination: Yes Received Pneumococcal Vaccination On or After 60th Birthday: Yes Implantable Devices No devices added Hospitalization / Surgery History Type of Hospitalization/Surgery Left Breast Lumpectomy;Tonsillectomy ; Squamous cell carcinoma; Hysterectomy;Right Leg liposarcoma Family and Social History Unknown History: Yes; Never smoker; Marital Status - Married; Alcohol Use: Never; Drug Use: No History; Caffeine Use: Never; Financial Concerns: No; Food, Clothing or Shelter Needs: No; Support System Lacking: No; Transportation Concerns: No Electronic Signature(s) Signed: 07/12/2022 4:23:55 PM By: Misty Guess MD FACS Entered By: [name] on 07/12/2022 13:56:14 -------------------------------------------------------------------------------- SuperBill Details Patient Name: Date of Service: [name], KARENZA ELBON 07/12/2022 Medical Record Number: [id number] Patient Account Number: [id number] Date of Birth/Sex: Treating RN: 09/02/1945 (77 y.o. F) Primary Care Provider: Willow [name] Other Clinician: Referring Provider: Treating Provider/Extender: Elsie Saas Weeks in Treatment: 19 Diagnosis Coding ICD-10 Codes Code Description [phone] Non-pressure chronic ulcer of other part of right lower leg with fat layer exposed L97.818 Non-pressure chronic ulcer of other part of right lower leg with other specified severity I10 Essential (primary) hypertension Z85.831 Personal history of malignant neoplasm of soft tissue Z92.3 Personal history of irradiation Facility Procedures : CPT4 Code: [id number] Description: [phone] - DEBRIDE WOUND 1ST 20 SQ CM OR < ICD-10 Diagnosis Description L97.812 Non-pressure chronic ulcer of other part of right lower leg with fat layer exposed L97.818 Non-pressure chronic ulcer of other part of right lower leg with other  specified s Modifier: everity Quantity: 1 Physician Procedures : CPT4 Code Description Modifier  [id number] - WC PHYS LEVEL 4 - EST PT 25 FRANCEE, STANLEY [name] ([id number]) [id number] P ICD-10 Diagnosis Description L97.812 Non-pressure chronic ulcer of other part of right lower leg with fat  layer exposed L97.818 Non-pressure chronic ulcer of other part of right lower leg with other specified severity Z85.831 Personal history of malignant neoplasm of soft tissue Z92.3 Personal history of irradiation Quantity: 1 age 57 of 55 : [id number] - WC PHYS DEBR WO ANESTH 20 SQ CM 1 ICD-10 Diagnosis Description L97.812 Non-pressure chronic ulcer of other part of right lower leg with fat layer exposed L97.818 Non-pressure chronic ulcer of other part of right lower leg with other  specified severity Quantity: Electronic Signature(s) Signed: 07/12/2022 1:58:29 PM By: Misty Guess MD FACS Entered By: [name] on 07/12/2022 13:58:29

## 2022-07-19 ENCOUNTER — Encounter (HOSPITAL_BASED_OUTPATIENT_CLINIC_OR_DEPARTMENT_OTHER): Payer: Medicare HMO | Admitting: General Surgery

## 2022-07-19 DIAGNOSIS — L97812 Non-pressure chronic ulcer of other part of right lower leg with fat layer exposed: Secondary | ICD-10-CM | POA: Diagnosis not present

## 2022-07-19 DIAGNOSIS — I872 Venous insufficiency (chronic) (peripheral): Secondary | ICD-10-CM | POA: Diagnosis not present

## 2022-07-19 DIAGNOSIS — E785 Hyperlipidemia, unspecified: Secondary | ICD-10-CM | POA: Diagnosis not present

## 2022-07-19 DIAGNOSIS — Z923 Personal history of irradiation: Secondary | ICD-10-CM | POA: Diagnosis not present

## 2022-07-19 DIAGNOSIS — L02415 Cutaneous abscess of right lower limb: Secondary | ICD-10-CM | POA: Diagnosis not present

## 2022-07-19 DIAGNOSIS — L988 Other specified disorders of the skin and subcutaneous tissue: Secondary | ICD-10-CM | POA: Diagnosis not present

## 2022-07-19 DIAGNOSIS — L97818 Non-pressure chronic ulcer of other part of right lower leg with other specified severity: Secondary | ICD-10-CM | POA: Diagnosis not present

## 2022-07-19 DIAGNOSIS — S81801A Unspecified open wound, right lower leg, initial encounter: Secondary | ICD-10-CM | POA: Diagnosis not present

## 2022-07-19 DIAGNOSIS — E039 Hypothyroidism, unspecified: Secondary | ICD-10-CM | POA: Diagnosis not present

## 2022-07-19 DIAGNOSIS — Z85831 Personal history of malignant neoplasm of soft tissue: Secondary | ICD-10-CM | POA: Diagnosis not present

## 2022-07-19 DIAGNOSIS — Z91030 Bee allergy status: Secondary | ICD-10-CM | POA: Diagnosis not present

## 2022-07-19 DIAGNOSIS — L97212 Non-pressure chronic ulcer of right calf with fat layer exposed: Secondary | ICD-10-CM | POA: Diagnosis not present

## 2022-07-19 DIAGNOSIS — I10 Essential (primary) hypertension: Secondary | ICD-10-CM | POA: Diagnosis not present

## 2022-07-26 ENCOUNTER — Ambulatory Visit: Payer: Medicare HMO | Admitting: Internal Medicine

## 2022-07-26 ENCOUNTER — Encounter (HOSPITAL_BASED_OUTPATIENT_CLINIC_OR_DEPARTMENT_OTHER): Payer: Medicare HMO | Admitting: General Surgery

## 2022-07-26 DIAGNOSIS — L97212 Non-pressure chronic ulcer of right calf with fat layer exposed: Secondary | ICD-10-CM | POA: Diagnosis not present

## 2022-07-26 DIAGNOSIS — Z85831 Personal history of malignant neoplasm of soft tissue: Secondary | ICD-10-CM | POA: Diagnosis not present

## 2022-07-26 DIAGNOSIS — Z923 Personal history of irradiation: Secondary | ICD-10-CM | POA: Diagnosis not present

## 2022-07-26 DIAGNOSIS — I872 Venous insufficiency (chronic) (peripheral): Secondary | ICD-10-CM | POA: Diagnosis not present

## 2022-07-26 DIAGNOSIS — E039 Hypothyroidism, unspecified: Secondary | ICD-10-CM | POA: Diagnosis not present

## 2022-07-26 DIAGNOSIS — L02415 Cutaneous abscess of right lower limb: Secondary | ICD-10-CM | POA: Diagnosis not present

## 2022-07-26 DIAGNOSIS — E785 Hyperlipidemia, unspecified: Secondary | ICD-10-CM | POA: Diagnosis not present

## 2022-07-26 DIAGNOSIS — L97818 Non-pressure chronic ulcer of other part of right lower leg with other specified severity: Secondary | ICD-10-CM | POA: Diagnosis not present

## 2022-07-26 DIAGNOSIS — L97812 Non-pressure chronic ulcer of other part of right lower leg with fat layer exposed: Secondary | ICD-10-CM | POA: Diagnosis not present

## 2022-07-26 DIAGNOSIS — I10 Essential (primary) hypertension: Secondary | ICD-10-CM | POA: Diagnosis not present

## 2022-07-26 DIAGNOSIS — Z91030 Bee allergy status: Secondary | ICD-10-CM | POA: Diagnosis not present

## 2022-07-28 ENCOUNTER — Ambulatory Visit (INDEPENDENT_AMBULATORY_CARE_PROVIDER_SITE_OTHER): Payer: Medicare HMO

## 2022-07-28 DIAGNOSIS — T63441D Toxic effect of venom of bees, accidental (unintentional), subsequent encounter: Secondary | ICD-10-CM | POA: Diagnosis not present

## 2022-08-01 ENCOUNTER — Encounter: Payer: Self-pay | Admitting: Internal Medicine

## 2022-08-01 ENCOUNTER — Ambulatory Visit (INDEPENDENT_AMBULATORY_CARE_PROVIDER_SITE_OTHER): Payer: Medicare HMO | Admitting: Internal Medicine

## 2022-08-01 VITALS — BP 124/68 | HR 78 | Temp 98.0°F | Resp 16 | Ht 60.0 in | Wt 121.0 lb

## 2022-08-01 DIAGNOSIS — E039 Hypothyroidism, unspecified: Secondary | ICD-10-CM

## 2022-08-01 DIAGNOSIS — R197 Diarrhea, unspecified: Secondary | ICD-10-CM

## 2022-08-01 DIAGNOSIS — E785 Hyperlipidemia, unspecified: Secondary | ICD-10-CM | POA: Diagnosis not present

## 2022-08-01 DIAGNOSIS — K116 Mucocele of salivary gland: Secondary | ICD-10-CM | POA: Diagnosis not present

## 2022-08-01 NOTE — Assessment & Plan Note (Signed)
HTN: See LOV, on amlodipine, metoprolol added, Ambulatory BPs are all within normal in the 120/70 range. Hyperlipidemia: Well-controlled on atorvastatin, check AST ALT Hypothyroidism: On Synthroid, check TSH. Diarrhea: As described above, she did report a small amount of mucus in the stools but no red flags otherwise.  Nontoxic-appearing.  She is able to drink fluids.  Recommend to take some Pepto-Bismol, continue with good hydration and watch for alarm symptoms.  Let me know if she is not better in few days. SCC skin cancer: Still going to the wound care center and  right leg wound is finally healing. RTC 6 months for a checkup.

## 2022-08-01 NOTE — Patient Instructions (Signed)
Check the  blood pressure regularly.  BP GOAL is between 110/65 and  135/85. If it is consistently higher or lower, let me know    GO TO THE LAB : Get the blood work     GO TO THE FRONT DESK, PLEASE SCHEDULE YOUR APPOINTMENTS Come back for a checkup in 6 months 

## 2022-08-01 NOTE — Progress Notes (Signed)
Subjective:    Patient ID: [name], female    DOB: [birth year], 77 y.o.   MRN: [id number]  DOS:  08/01/2022 Type of visit - description: Routine checkup  We talk about her chronic medical problems. Good compliance with medication.  Ambulatory BPs are normal. She did go to a wedding 3 days ago, the next day developed some chills and nausea which are now resolved. Also developed diarrhea described as soft and occasional watery stools associated with some mucus but no blood. Today seems a little better.  Her husband  had similar symptoms but he quickly recuperated within 24 hours.  No fever or chills.  No abdominal pain No URI type of symptoms, no aches or pains.   Review of Systems See above   Past Medical History:  Diagnosis Date   Allergy    Angio-edema    Arthritis    Asthma    Cataract    Hyperlipidemia    Hypertension    Hypothyroidism    Liposarcoma (HCC) 1972   r leg   Osteoporosis    SCC (squamous cell carcinoma)    Urticaria     Past Surgical History:  Procedure Laterality Date   ABDOMINAL HYSTERECTOMY     no oophorectomy   BREAST LUMPECTOMY     x2 left breast   R leg liposarcoma  1973   SQUAMOUS CELL CARCINOMA EXCISION     RLE   TONSILLECTOMY      Current Outpatient Medications  Medication Instructions   amLODipine (NORVASC) 10 mg, Oral, Daily   atorvastatin (LIPITOR) 20 mg, Oral, Daily   calcium citrate-vitamin D (CITRACAL+D) 315-200 MG-UNIT per tablet 1 tablet   cholecalciferol (VITAMIN D) 1,000 Units, Oral, Daily   denosumab (PROLIA) 60 MG/ML SOSY injection Pt will get at [facility] on 04/11/22   EPINEPHrine (EPI-PEN) 0.3 mg, Intramuscular, As needed   latanoprost (XALATAN) 0.005 % ophthalmic solution 1 drop, Both Eyes, Daily at bedtime   levothyroxine (SYNTHROID) 50 mcg, Oral, Daily before breakfast   metoprolol succinate (TOPROL-XL) 25 mg, Oral, Daily, Take with or immediately following a meal   Multiple Vitamin (MULTIVITAMIN)  tablet 1 tablet, Oral, Daily,         Objective:   Physical Exam BP 124/68   Pulse 78   Temp 98 F (36.7 C) (Oral)   Resp 16   Ht 5' (1.524 m)   Wt 121 lb (54.9 kg)   SpO2 97%   BMI 23.63 kg/m  General:   Well developed, NAD, BMI noted.  HEENT:  Normocephalic . Face symmetric, atraumatic Lungs:  CTA B Normal respiratory effort, no intercostal retractions, no accessory muscle use. Heart: RRR,  no murmur.  Abdomen:  Not distended, soft, non-tender. No rebound or rigidity.   Skin: Not pale. Not jaundice Lower extremities: no pretibial edema bilaterally  Neurologic:  alert & oriented X3.  Speech normal, gait appropriate for age and unassisted Psych--  Cognition and judgment appear intact.  Cooperative with normal attention span and concentration.  Behavior appropriate. No anxious or depressed appearing.     Assessment    Assessment HTN Hyperlipidemia Hypothyroidism Osteoporosis --T score 2013: -2.6,  T score 02-2014  -2.5 after  Reclast x 3. Last reclast 05-2013. T score 02-2016: -2.4. T score -2.6 (04-2018), Rx Prolia.  --Normal Vit D 2014 SCC, skin cancer, right lower extremity, sees derm q 6 months Liposarcoma right leg 1972 (near ankle, posteriorly)   PLAN: HTN: See LOV, on amlodipine, metoprolol added,  Ambulatory BPs are all within normal in the 120/70 range. Hyperlipidemia: Well-controlled on atorvastatin, check AST ALT Hypothyroidism: On Synthroid, check TSH. Diarrhea: As described above, she did report a small amount of mucus in the stools but no red flags otherwise.  Nontoxic-appearing.  She is able to drink fluids.  Recommend to take some Pepto-Bismol, continue with good hydration and watch for alarm symptoms.  Let me know if she is not better in few days. SCC skin cancer: Still going to the wound care center and  right leg wound is finally healing. RTC 6 months for a checkup.

## 2022-08-02 ENCOUNTER — Encounter (HOSPITAL_BASED_OUTPATIENT_CLINIC_OR_DEPARTMENT_OTHER): Payer: Medicare HMO | Admitting: General Surgery

## 2022-08-02 ENCOUNTER — Other Ambulatory Visit (HOSPITAL_BASED_OUTPATIENT_CLINIC_OR_DEPARTMENT_OTHER): Payer: Self-pay

## 2022-08-02 DIAGNOSIS — E039 Hypothyroidism, unspecified: Secondary | ICD-10-CM | POA: Diagnosis not present

## 2022-08-02 DIAGNOSIS — Z923 Personal history of irradiation: Secondary | ICD-10-CM | POA: Diagnosis not present

## 2022-08-02 DIAGNOSIS — I872 Venous insufficiency (chronic) (peripheral): Secondary | ICD-10-CM | POA: Diagnosis not present

## 2022-08-02 DIAGNOSIS — L97812 Non-pressure chronic ulcer of other part of right lower leg with fat layer exposed: Secondary | ICD-10-CM | POA: Diagnosis not present

## 2022-08-02 DIAGNOSIS — L97818 Non-pressure chronic ulcer of other part of right lower leg with other specified severity: Secondary | ICD-10-CM | POA: Diagnosis not present

## 2022-08-02 DIAGNOSIS — Z91030 Bee allergy status: Secondary | ICD-10-CM | POA: Diagnosis not present

## 2022-08-02 DIAGNOSIS — I10 Essential (primary) hypertension: Secondary | ICD-10-CM | POA: Diagnosis not present

## 2022-08-02 DIAGNOSIS — E785 Hyperlipidemia, unspecified: Secondary | ICD-10-CM | POA: Diagnosis not present

## 2022-08-02 DIAGNOSIS — L97212 Non-pressure chronic ulcer of right calf with fat layer exposed: Secondary | ICD-10-CM | POA: Diagnosis not present

## 2022-08-02 DIAGNOSIS — Z85831 Personal history of malignant neoplasm of soft tissue: Secondary | ICD-10-CM | POA: Diagnosis not present

## 2022-08-02 DIAGNOSIS — L02415 Cutaneous abscess of right lower limb: Secondary | ICD-10-CM | POA: Diagnosis not present

## 2022-08-02 LAB — AST: AST: 25 U/L (ref 0–37)

## 2022-08-02 LAB — TSH: TSH: 1.22 u[IU]/mL (ref 0.35–5.50)

## 2022-08-02 LAB — ALT: ALT: 21 U/L (ref 0–35)

## 2022-08-08 DIAGNOSIS — K116 Mucocele of salivary gland: Secondary | ICD-10-CM | POA: Diagnosis not present

## 2022-08-08 DIAGNOSIS — K1120 Sialoadenitis, unspecified: Secondary | ICD-10-CM | POA: Diagnosis not present

## 2022-08-09 ENCOUNTER — Encounter (HOSPITAL_BASED_OUTPATIENT_CLINIC_OR_DEPARTMENT_OTHER): Payer: Medicare HMO | Attending: General Surgery | Admitting: General Surgery

## 2022-08-09 DIAGNOSIS — I10 Essential (primary) hypertension: Secondary | ICD-10-CM | POA: Diagnosis not present

## 2022-08-09 DIAGNOSIS — E039 Hypothyroidism, unspecified: Secondary | ICD-10-CM | POA: Insufficient documentation

## 2022-08-09 DIAGNOSIS — Z923 Personal history of irradiation: Secondary | ICD-10-CM | POA: Diagnosis not present

## 2022-08-09 DIAGNOSIS — I890 Lymphedema, not elsewhere classified: Secondary | ICD-10-CM | POA: Insufficient documentation

## 2022-08-09 DIAGNOSIS — L97812 Non-pressure chronic ulcer of other part of right lower leg with fat layer exposed: Secondary | ICD-10-CM | POA: Insufficient documentation

## 2022-08-09 DIAGNOSIS — J45909 Unspecified asthma, uncomplicated: Secondary | ICD-10-CM | POA: Diagnosis not present

## 2022-08-09 DIAGNOSIS — E785 Hyperlipidemia, unspecified: Secondary | ICD-10-CM | POA: Diagnosis not present

## 2022-08-16 ENCOUNTER — Encounter (HOSPITAL_BASED_OUTPATIENT_CLINIC_OR_DEPARTMENT_OTHER): Payer: Medicare HMO | Admitting: General Surgery

## 2022-08-18 DIAGNOSIS — H40023 Open angle with borderline findings, high risk, bilateral: Secondary | ICD-10-CM | POA: Diagnosis not present

## 2022-08-22 ENCOUNTER — Ambulatory Visit: Payer: Medicare HMO

## 2022-08-22 ENCOUNTER — Ambulatory Visit (INDEPENDENT_AMBULATORY_CARE_PROVIDER_SITE_OTHER): Payer: Medicare HMO

## 2022-08-22 DIAGNOSIS — T63441D Toxic effect of venom of bees, accidental (unintentional), subsequent encounter: Secondary | ICD-10-CM | POA: Diagnosis not present

## 2022-09-01 ENCOUNTER — Telehealth: Payer: Self-pay

## 2022-09-01 ENCOUNTER — Other Ambulatory Visit (HOSPITAL_COMMUNITY): Payer: Self-pay

## 2022-09-01 NOTE — Telephone Encounter (Signed)
Prolia VOB initiated via [email or web address] 

## 2022-09-04 NOTE — Telephone Encounter (Signed)
Pharmacy Patient Advocate Encounter    PA for Prolia submitted to [facility] via [employer] Authorization # [id number] Status is pending

## 2022-09-05 ENCOUNTER — Other Ambulatory Visit (HOSPITAL_COMMUNITY): Payer: Self-pay

## 2022-09-05 NOTE — Telephone Encounter (Signed)
Pt ready for scheduling for PROLIA on or after : 10/09/22  Out-of-pocket cost due at time of visit: $327  Primary: [employer] Prolia co-insurance: 20% Admin fee co-insurance: 20%  Secondary: --- Prolia co-insurance:  Admin fee co-insurance:   Medical Benefit Details: Date Benefits were checked: 09/01/22 Deductible: NO/ Coinsurance: 20%/ Admin Fee: 20%  Prior Auth: APPROVED PA# [id number] Expiration Date: 09/04/22-09/04/23   Pharmacy benefit: Copay $--- (Refill too soon, next fill 10/01/22) If patient wants fill through the pharmacy benefit please send prescription to: [employer], and include estimated need by date in rx notes. Pharmacy will ship medication directly to the office.  Patient not eligible for Prolia Copay Card. Copay Card can make patient's cost as little as $25. Link to apply: [email or web address]://[email or web address]  ** This summary of benefits is an estimation of the patient's out-of-pocket cost. Exact cost may very based on individual plan coverage.

## 2022-09-06 ENCOUNTER — Other Ambulatory Visit: Payer: Self-pay

## 2022-09-06 ENCOUNTER — Telehealth: Payer: Self-pay | Admitting: *Deleted

## 2022-09-06 ENCOUNTER — Other Ambulatory Visit (HOSPITAL_COMMUNITY): Payer: Self-pay

## 2022-09-06 MED ORDER — DENOSUMAB 60 MG/ML ~~LOC~~ SOSY
PREFILLED_SYRINGE | SUBCUTANEOUS | 0 refills | Status: DC
  Filled 2022-09-06: qty 1, fill #0
  Filled 2022-09-18: qty 1, 180d supply, fill #0

## 2022-09-06 NOTE — Telephone Encounter (Addendum)
Left message on machine to call back to schedule.    Pt will be due on or around 10/10/22.  We will order from pharmacy [facility] once pt is scheduled)

## 2022-09-06 NOTE — Addendum Note (Signed)
Addended by: Thelma Barge D on: 09/06/2022 10:41 AM   Modules accepted: Orders

## 2022-09-06 NOTE — Telephone Encounter (Signed)
Left message on machine to call back to schedule.    Pt will be due on or around 10/10/22.

## 2022-09-06 NOTE — Telephone Encounter (Signed)
Pt scheduled for 10/11/22 and rx sent to [facility]

## 2022-09-14 ENCOUNTER — Other Ambulatory Visit (HOSPITAL_COMMUNITY): Payer: Self-pay

## 2022-09-16 ENCOUNTER — Other Ambulatory Visit: Payer: Self-pay | Admitting: Internal Medicine

## 2022-09-18 ENCOUNTER — Other Ambulatory Visit (HOSPITAL_COMMUNITY): Payer: Self-pay

## 2022-09-18 ENCOUNTER — Other Ambulatory Visit: Payer: Self-pay

## 2022-09-18 DIAGNOSIS — H40023 Open angle with borderline findings, high risk, bilateral: Secondary | ICD-10-CM | POA: Diagnosis not present

## 2022-09-18 DIAGNOSIS — H2513 Age-related nuclear cataract, bilateral: Secondary | ICD-10-CM | POA: Diagnosis not present

## 2022-09-20 ENCOUNTER — Ambulatory Visit (INDEPENDENT_AMBULATORY_CARE_PROVIDER_SITE_OTHER): Payer: Medicare HMO

## 2022-09-20 DIAGNOSIS — T63441D Toxic effect of venom of bees, accidental (unintentional), subsequent encounter: Secondary | ICD-10-CM

## 2022-09-22 ENCOUNTER — Ambulatory Visit (INDEPENDENT_AMBULATORY_CARE_PROVIDER_SITE_OTHER): Payer: Medicare HMO | Admitting: Internal Medicine

## 2022-09-22 ENCOUNTER — Encounter: Payer: Self-pay | Admitting: Internal Medicine

## 2022-09-22 VITALS — BP 136/60 | HR 55 | Temp 97.8°F | Resp 16 | Ht 60.0 in | Wt 121.0 lb

## 2022-09-22 DIAGNOSIS — T63441A Toxic effect of venom of bees, accidental (unintentional), initial encounter: Secondary | ICD-10-CM | POA: Insufficient documentation

## 2022-09-22 DIAGNOSIS — J3089 Other allergic rhinitis: Secondary | ICD-10-CM | POA: Diagnosis not present

## 2022-09-22 DIAGNOSIS — J302 Other seasonal allergic rhinitis: Secondary | ICD-10-CM

## 2022-09-22 DIAGNOSIS — L299 Pruritus, unspecified: Secondary | ICD-10-CM | POA: Diagnosis not present

## 2022-09-22 DIAGNOSIS — T63441D Toxic effect of venom of bees, accidental (unintentional), subsequent encounter: Secondary | ICD-10-CM | POA: Diagnosis not present

## 2022-09-22 NOTE — Progress Notes (Signed)
FOLLOW UP Date of Service/Encounter:  09/22/22   Subjective:  [name] (DOB: December 12, 1945) is a 77 y.o. female who [facility] in re-evaluation of the following: contact dermatitis, venom allergies on VIT and allergic rhinitis History obtained from: chart review and patient.  For Review, LV was on 03/17/22  with Dr.Shyhiem Beeney seen for  final patch test reading . See below for summary of history and diagnostics.  Therapeutic plans/changes recommended: patch testing was  + thimerosol and balsam of [place], avoidance recommended ----------------------------------------------------- Pertinent History/Diagnostics:  Allergic Rhinitis:  Some rhinitis symptoms in the spring and fall for which she usually does not take any medications for.  2021 skin testing positive to tree pollen and dust mites.  Hymenoptera allergy Anaphylactic reaction in the form of itching and feeling lightheaded and dizzy. She did have significant presyncope, hypotension and EMS was not able to obtain a pulse. To possibly yellow jacket requiring epinephrine, Solu-Medrol and Benadryl.  Symptoms resolved within 1 hour. Previously stung by yellow jacket without any symptoms.  Possibly stung by fire ant which caused pruritic rash on her body for 1 day. 2021 skin testing showed: negative to fire ant. On VIT for mixed vespid, wasp and honey bee, started in 2021. Lifelong therapy has been recommended.  Contact deramtitis:  2024 patch testing : + thimerosol and balsam of [place] --------------------------------------------------- Today presents for follow-up. Her venom injections are well tolerated.  She is coming every 6 weeks. She has not been stung since last visit. Her first reaction was severe, and lifelong therapy has been recommended today.  Last injection on 07/717/24. Epipen has been refilled this year. Her squamous cell removal has finally healed.  She continues to itch, but does follow  with dermatology. Has been recommended to use vanicream.  Itching is worse on her arms and legs.  She is using cerave. She tries not to scratch.  Was told her skin is dry and this is part of her itching. Has not tried sarna lotion. Rhinitis symptoms controlled.   Allergies as of 09/22/2022       Reactions   Bee Venom Anaphylaxis   Wasp, yellow jacket, hornet   Promethazine Hcl    Other reaction(s): Unknown   Thimerosal (thiomersal) Itching   Moxifloxacin Palpitations        Medication List        Accurate as of September 22, 2022 10:42 AM. If you have any questions, ask your nurse or doctor.          STOP taking these medications    latanoprost 0.005 % ophthalmic solution Commonly known as: XALATAN Stopped by: Verlee Monte       TAKE these medications    amLODipine 10 MG tablet Commonly known as: NORVASC Take 1 tablet (10 mg total) by mouth daily.   atorvastatin 20 MG tablet Commonly known as: LIPITOR TAKE 1 TABLET BY MOUTH DAILY   calcium citrate-vitamin D 315-200 MG-UNIT tablet Commonly known as: CITRACAL+D Take 1 tablet by mouth. 600 mg bid   cholecalciferol 1000 units tablet Commonly known as: VITAMIN D Take 1,000 Units by mouth daily.   denosumab 60 MG/ML Sosy injection Commonly known as: PROLIA Pt [facility] on 10/11/22   EPINEPHrine 0.3 mg/0.3 mL Soaj injection Commonly known as: EPI-PEN Inject 0.3 mg into the muscle as needed for anaphylaxis.   levothyroxine 50 MCG tablet Commonly known as: SYNTHROID TAKE 1 TABLET BY MOUTH DAILY  BEFORE BREAKFAST   metoprolol succinate 25 MG 24 hr tablet Commonly known as: TOPROL-XL Take 1 tablet (25 mg total) by mouth daily. Take with or immediately following a meal   multivitamin tablet Take 1 tablet by mouth daily.       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  ROS: All others negative except as noted per HPI.   Objective:  BP 136/60   Pulse (!) 55   Temp 97.8 F (36.6 C) (Temporal)   Resp 16   Ht 5' (1.524 m)   Wt 121 lb (54.9 kg)   SpO2 98%   BMI 23.63 kg/m  Body mass index is 23.63 kg/m. Physical Exam: General Appearance:  Alert, cooperative, very pleasant, no distress, appears stated age  Head:  Normocephalic, without obvious abnormality, atraumatic  Eyes:  Conjunctiva clear, EOM's intact  Nose: Nares normal, normal mucosa  Throat: Lips, tongue normal; teeth and gums normal, normal posterior oropharynx  Neck: Supple, symmetrical  Lungs:   clear to auscultation bilaterally, Respirations unlabored, no coughing  Heart:  regular rate and rhythm and no murmur, Appears well perfused  Extremities: No edema  Skin: Dry skin and no rashes or lesions on visualized portions of skin, well healing wound on posterior calf, significant muscle loss in this area from previous squamous cell removal  Neurologic: No gross deficits   Assessment/Plan   Bee sting reactions: stable, chronic Continue to avoid bee stings and fire ant. Continue injections. Lifelong therapy is recommended. If you are stung and have any symptoms which may include breathing issues, throat closure, significant swelling, whole body hives, severe diarrhea and vomiting, lightheadedness then inject epinephrine and seek immediate medical care afterwards. Emergency action plan in place.  Have a medical alert bracelet - for the stinging  insects.   Environmental allergies-at goal 2021 skin testing positive to tree pollen and dust mites.  Continue environmental control measures as below. May use over the counter antihistamines such as Zyrtec (cetirizine), Claritin (loratadine), Allegra (fexofenadine), or Xyzal (levocetirizine) daily as needed.  Contact dermatitis/chronic pruritus: not at goal Avoid products containing thimerosol and balsam of [place] Keep skin hydrated.  Follow up : 1 year, sooner if needed It was a pleasure seeing you again in clinic today! Thank you for allowing me to participate in your care.  Tonny Bollman, MD  [facility]

## 2022-09-22 NOTE — Patient Instructions (Addendum)
Bee sting reactions: Continue to avoid bee stings and fire ant. Continue injections. Lifelong therapy is recommended. If you are stung and have any symptoms which may include breathing issues, throat closure, significant swelling, whole body hives, severe diarrhea and vomiting, lightheadedness then inject epinephrine and seek immediate medical care afterwards. Emergency action plan in place.  Have a medical alert bracelet - for the stinging insects.   Environmental allergies 2021 skin testing positive to tree pollen and dust mites.  Continue environmental control measures as below. May use over the counter antihistamines such as Zyrtec (cetirizine), Claritin (loratadine), Allegra (fexofenadine), or Xyzal (levocetirizine) daily as needed.  Contact dermatitis/chronic pruritus:  Avoid products containing thimerosol and balsam of [place] Keep skin hydrated.  Follow up : 1 year, sooner if needed It was a pleasure seeing you again in clinic today! Thank you for allowing me to participate in your care.  Tonny Bollman, MD [facility]

## 2022-09-27 NOTE — Progress Notes (Signed)
CALEE, NUGENT ([id number]) [id number] Page 1 of 8 Visit Report for 08/09/2022 Chief Complaint Document Details Patient Name: Date of Service: [name], CLUM 08/09/2022 7:45 A M Medical Record Number: [id number] Patient Account Number: [id number] Date of Birth/Sex: Treating RN: 1945/12/16 (77 y.o. F) Primary Care Provider: Willow Ora Other Clinician: Referring Provider: Treating Provider/Extender: Cresenciano Genre in Treatment: 23 Information Obtained from: Patient Chief Complaint Patient seen for complaints of Non-Healing Wound. Electronic Signature(s) Signed: 08/09/2022 8:00:55 AM By: Duanne Guess MD FACS Entered By: Duanne Guess on 08/09/2022 08:00:55 -------------------------------------------------------------------------------- HPI Details Patient Name: Date of Service: [name], Money V. 08/09/2022 7:45 A M Medical Record Number: [id number] Patient Account Number: [id number] Date of Birth/Sex: Treating RN: 29-Jan-1946 (77 y.o. F) Primary Care Provider: Willow Ora Other Clinician: Referring Provider: Treating Provider/Extender: Cresenciano Genre in Treatment: 23 History of Present Illness HPI Description: ADMISSION 03/01/2022 This is a 77 year old otherwise fairly healthy woman (not diabetic, not obese, does not smoke) who presents to the clinic with an ulcer on [name] right posterior leg. 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 The patient is not sure but believes [name] received radiation therapy to the location. [name] did have a skin graft. About 6 months ago, [name] developed a small ulcer in the area that seemed to start as an abscess. [name] has had an open wound in that area since then. A shave biopsy was taken by a dermatologist that showed inflammation without evidence of malignancy. [name] has been applying Vaseline to the site along with periwound TCA. ABI in clinic today is 0.94. Due to the failure of the wound to heal  properly, [name] has been referred to the wound care center for further evaluation and management. 03/09/2022: The tissue on the wound is protruding further from the skin surface. It does not have a typical appearance consistent with hypertrophic granulation tissue. I am concerned that it may represent malignancy. 03/17/2022: Fortunately, the biopsy that I took last week was negative for malignancy. It returned as consistent with an ulcer and inflamed granulation tissue. [name] still has hypertrophic granulation tissue at the site and some slough accumulation on the surface. No significant change in the wound dimensions. 03/23/2022: The wound measured smaller today and the hypertrophic granulation tissue is less prominent. Due to the unusual shape of [name] leg from old surgery, however, there has been some friction from [name] compressive wrap. 03/31/2022: The wound is slightly smaller today. There is light slough on the surface. There is still some hypertrophic granulation tissue present. 04/12/2022: [name] has a new wound near the top of [name] surgical defect. Where the skin rolls over, there has been some friction and abrasion that has resulted in tissue breakdown. There is slough buildup on the surface. The original wound is smaller and there is epithelialization around the borders. There is slough buildup on the surface again. [name] has been approved for TheraSkin but it is unclear what [name] financial responsibility would be at this point. We are still working on determining that. [name] is scheduled to undergo surgical excision of the squamous cell carcinoma on [name] anterior tibial surface. 04/17/2022: Both posterior leg wounds are smaller today. They both have slough accumulation. [name] had [name] Mohs surgery and there is a defect on [name] anterior tibial surface. I spoke with [name] dermatologist and we are going to assume care for this wound. There is a layer of rubbery slough on this wound and it appears a  little dry. 04/24/2022:  All 3 wounds have a thick layer of rubbery slough on them. There is hypertrophic granulation tissue underneath this on both the anterior tibial surface wound and the posterior Achilles wound. [name] has a wound on [name] right forearm that was cultured by [name] dermatologist and grew out heavy [address] ROCKIE, VAWTER V ([id number]) [id number] Page 2 of 8 marcescens and moderate Staph aureus. Apparently they are just having [name] apply topical mupirocin to the site because [name] has previously had a reaction to moxifloxacin, which is apparently the antibiotic they wanted to use. [name] asks if I have any other recommendations. 05/01/2022: All 3 wounds look better this week. The hypertrophic granulation tissue has resolved on [name] anterior tibia and posterior Achilles wound. The wounds are all smaller. They have some slough accumulation. The moisture balance on the anterior tibial wound is good; the posterior wounds are a bit dry. 05/08/2022: The anterior tibial wound is smaller without any hypertrophic granulation tissue. There is a little bit of slough buildup. There is more slough on the posterior Achilles wound. The posterior wound under the flap of skin is a little bit macerated. 05/15/2022: The anterior tibial wound is healed. The posterior Achilles wound has heavy slough buildup. The posterior wound under the flap of skin continues to be macerated with slough accumulation. 05/29/2022: We are still dealing with a lot of moisture accumulation under the skin flap. There was an odor coming from the wound today when [name] dressing was removed. The Achilles wound has slough accumulation once again. 06/05/2022: Unfortunately, [name] seems to have had an allergic reaction to some component of [name] dressing, possibly the medium for the TCA. [name] has broken out on [name] entire lower leg. There has been more moisture accumulation under the flap of tissue on [name] posterior leg and a linear ulcer has opened just caudal  to the existing ulcer in this site. There is slough on the Achilles wound, but there is also more epithelialization. 06/13/2022: All of the wounds look significantly better this week. We have much better moisture control on the posterior leg near the flap of tissue. There is some slough accumulation on all wound surfaces. [name] skin looks less angry this week, as well. 06/20/2022: Both of the small wounds near the skin fold have contracted. There is some slough on the surface. The initial wound has some thick rubbery slough on the surface but is epithelializing. [name] skin is in better condition this week. 06/27/2022: The more proximal wound under the skin fold is closed. The smaller linear wound just distal to this remains open with some slough on the surface. The primary wound measured the same size, but visually it is smaller this week. There is rubbery slough on the surface. [name] skin remains in good condition. 07/04/2022: The primary Achilles wound is smaller again this week and is starting to flatten out. There is some slough on the surface. The small linear wound near the apex of [name] skin graft is closing in nicely with just a little slough on the surface here. Edema control is excellent. 07/12/2022: Both sites have contracted; the linear wound at the proximal aspect of [name] skin graft site is nearly closed. There is some slough and eschar in both locations. 07/19/2022: Both sites are smaller again today. The linear wound at the proximal aspect of [name] skin graft site is down to just a pinhole underneath a thin layer of eschar. The Achilles wound has some slough accumulation and some periwound skin irritation  but no actual skin breakdown. The skin of [name] lower leg looks fairly irritated and almost as though [name] is having an eczema flare. 07/26/2022: The linear wound at the proximal aspect of [name] skin graft site has closed. The Achilles wound is quite a bit smaller this week and just has a little bit of slough  on the surface. The skin of [name] leg looks significantly improved. 08/02/2022: Continued improvement in the appearance of [name] periwound skin. The wound on [name] Achilles is down to just a few millimeters in each dimension under a layer of eschar. Edema control remains good with just the Ace bandage. 08/09/2022: [name] wound is healed. Electronic Signature(s) Signed: 08/09/2022 8:01:17 AM By: Duanne Guess MD FACS Entered By: Duanne Guess on 08/09/2022 08:01:17 -------------------------------------------------------------------------------- Physical Exam Details Patient Name: Date of Service: Misty Val Eagle [name], Akeema V. 08/09/2022 7:45 A M Medical Record Number: [id number] Patient Account Number: [id number] Date of Birth/Sex: Treating RN: 1945/03/18 (77 y.o. F) Primary Care Provider: Willow Ora Other Clinician: Referring Provider: Treating Provider/Extender: Cresenciano Genre in Treatment: 23 Constitutional Hypertensive, asymptomatic. Bradycardic, asymptomatic. . . no acute distress. Respiratory Normal work of breathing on room air. Notes 08/09/2022: [name] wound is healed. Electronic Signature(s) Signed: 08/09/2022 8:01:55 AM By: Duanne Guess MD FACS Entered By: Duanne Guess on 08/09/2022 08:01:54 [name] ([id number]) [id number] Page 3 of 8 -------------------------------------------------------------------------------- Physician Orders Details Patient Name: Date of Service: ZAKYAH, YANES 08/09/2022 7:45 A M Medical Record Number: [id number] Patient Account Number: [id number] Date of Birth/Sex: Treating RN: 04-24-1945 (77 y.o. [name] Primary Care Provider: Willow Ora Other Clinician: Referring Provider: Treating Provider/Extender: Cresenciano Genre in Treatment: 23 Verbal / Phone Orders: No Diagnosis Coding ICD-10 Coding Code Description [phone] Non-pressure chronic ulcer of other part of right lower leg with fat  layer exposed I10 Essential (primary) hypertension Z85.831 Personal history of malignant neoplasm of soft tissue Z92.3 Personal history of irradiation Discharge From [facility] Services Discharge from Wound Care Center - Congratulations!!!!!!!!! Anesthetic (In clinic) Topical Lidocaine 4% applied to wound bed Edema Control - Lymphedema / SCD / Other void standing for long periods of time. - Rest and elevate legs throughout the day. A Moisturize legs daily. - Use a lotion or moisturizer for example :Sween,Eucerin;Aquaphor etc. Patient Medications llergies: bee venom protein (honey bee), moxifloxacin, promethazine HCl, balsam [place], thimerosal A Notifications Medication Indication Start End 08/09/2022 lidocaine DOSE topical 4 % cream - cream topical Electronic Signature(s) Signed: 08/09/2022 8:02:04 AM By: Duanne Guess MD FACS Entered By: Duanne Guess on 08/09/2022 08:02:04 -------------------------------------------------------------------------------- Problem List Details Patient Name: Date of Service: [name], Caitlynn V. 08/09/2022 7:45 A M Medical Record Number: [id number] Patient Account Number: [id number] Date of Birth/Sex: Treating RN: [birth year] (77 y.o. F) Primary Care Provider: Willow Ora Other Clinician: Referring Provider: Treating Provider/Extender: Cresenciano Genre in Treatment: 23 Active Problems ICD-10 Encounter Code Description Active Date MDM Diagnosis L97.812 Non-pressure chronic ulcer of other part of right lower leg with fat layer 03/01/2022 No Yes exposed SUZI, HERNAN ([id number]) [id number] Page 4 of 8 I10 Essential (primary) hypertension 03/01/2022 No Yes Z85.831 Personal history of malignant neoplasm of soft tissue 03/01/2022 No Yes Z92.3 Personal history of irradiation 03/01/2022 No Yes Inactive Problems Resolved Problems ICD-10 Code Description Active Date Resolved Date L97.818 Non-pressure chronic ulcer of other  part of right lower leg with other specified severity 04/17/2022 04/17/2022 Electronic Signature(s) Signed: 08/09/2022 8:00:45 AM By: Duanne Guess MD  FACS Entered By: Duanne Guess on 08/09/2022 08:00:45 -------------------------------------------------------------------------------- Progress Note Details Patient Name: Date of Service: Misty BRETTE, CAST 08/09/2022 7:45 A M Medical Record Number: [id number] Patient Account Number: [id number] Date of Birth/Sex: Treating RN: 1946/02/14 (77 y.o. F) Primary Care Provider: Willow Ora Other Clinician: Referring Provider: Treating Provider/Extender: Cresenciano Genre in Treatment: 23 Subjective Chief Complaint Information obtained from Patient Patient seen for complaints of Non-Healing Wound. History of Present Illness (HPI) ADMISSION 03/01/2022 This is a 77 year old otherwise fairly healthy woman (not diabetic, not obese, does not smoke) who presents to the clinic with an ulcer on [name] right posterior leg. 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 The patient is not sure but believes [name] received radiation therapy to the location. [name] did have a skin graft. About 6 months ago, [name] developed a small ulcer in the area that seemed to start as an abscess. [name] has had an open wound in that area since then. A shave biopsy was taken by a dermatologist that showed inflammation without evidence of malignancy. [name] has been applying Vaseline to the site along with periwound TCA. ABI in clinic today is 0.94. Due to the failure of the wound to heal properly, [name] has been referred to the wound care center for further evaluation and management. 03/09/2022: The tissue on the wound is protruding further from the skin surface. It does not have a typical appearance consistent with hypertrophic granulation tissue. I am concerned that it may represent malignancy. 03/17/2022: Fortunately, the biopsy that I took last week was negative for  malignancy. It returned as consistent with an ulcer and inflamed granulation tissue. [name] still has hypertrophic granulation tissue at the site and some slough accumulation on the surface. No significant change in the wound dimensions. 03/23/2022: The wound measured smaller today and the hypertrophic granulation tissue is less prominent. Due to the unusual shape of [name] leg from old surgery, however, there has been some friction from [name] compressive wrap. 03/31/2022: The wound is slightly smaller today. There is light slough on the surface. There is still some hypertrophic granulation tissue present. 04/12/2022: [name] has a new wound near the top of [name] surgical defect. Where the skin rolls over, there has been some friction and abrasion that has resulted in tissue breakdown. There is slough buildup on the surface. The original wound is smaller and there is epithelialization around the borders. There is slough buildup on the surface again. [name] has been approved for TheraSkin but it is unclear what [name] financial responsibility would be at this point. We are still working on determining that. [name] is scheduled to undergo surgical excision of the squamous cell carcinoma on [name] anterior tibial surface. 04/17/2022: Both posterior leg wounds are smaller today. They both have slough accumulation. [name] had [name] Mohs surgery and there is a defect on [name] anterior AAYRA, HORNBAKER ([id number]) [id number] Page 5 of 8 tibial surface. I spoke with [name] dermatologist and we are going to assume care for this wound. There is a layer of rubbery slough on this wound and it appears a little dry. 04/24/2022: All 3 wounds have a thick layer of rubbery slough on them. There is hypertrophic granulation tissue underneath this on both the anterior tibial surface wound and the posterior Achilles wound. [name] has a wound on [name] right forearm that was cultured by [name] dermatologist and grew out heavy Serratia marcescens  and moderate Staph aureus. Apparently they are just having [name] apply topical  mupirocin to the site because [name] has previously had a reaction to moxifloxacin, which is apparently the antibiotic they wanted to use. [name] asks if I have any other recommendations. 05/01/2022: All 3 wounds look better this week. The hypertrophic granulation tissue has resolved on [name] anterior tibia and posterior Achilles wound. The wounds are all smaller. They have some slough accumulation. The moisture balance on the anterior tibial wound is good; the posterior wounds are a bit dry. 05/08/2022: The anterior tibial wound is smaller without any hypertrophic granulation tissue. There is a little bit of slough buildup. There is more slough on the posterior Achilles wound. The posterior wound under the flap of skin is a little bit macerated. 05/15/2022: The anterior tibial wound is healed. The posterior Achilles wound has heavy slough buildup. The posterior wound under the flap of skin continues to be macerated with slough accumulation. 05/29/2022: We are still dealing with a lot of moisture accumulation under the skin flap. There was an odor coming from the wound today when [name] dressing was removed. The Achilles wound has slough accumulation once again. 06/05/2022: Unfortunately, [name] seems to have had an allergic reaction to some component of [name] dressing, possibly the medium for the TCA. [name] has broken out on [name] entire lower leg. There has been more moisture accumulation under the flap of tissue on [name] posterior leg and a linear ulcer has opened just caudal to the existing ulcer in this site. There is slough on the Achilles wound, but there is also more epithelialization. 06/13/2022: All of the wounds look significantly better this week. We have much better moisture control on the posterior leg near the flap of tissue. There is some slough accumulation on all wound surfaces. [name] skin looks less angry this week, as well. 06/20/2022:  Both of the small wounds near the skin fold have contracted. There is some slough on the surface. The initial wound has some thick rubbery slough on the surface but is epithelializing. [name] skin is in better condition this week. 06/27/2022: The more proximal wound under the skin fold is closed. The smaller linear wound just distal to this remains open with some slough on the surface. The primary wound measured the same size, but visually it is smaller this week. There is rubbery slough on the surface. [name] skin remains in good condition. 07/04/2022: The primary Achilles wound is smaller again this week and is starting to flatten out. There is some slough on the surface. The small linear wound near the apex of [name] skin graft is closing in nicely with just a little slough on the surface here. Edema control is excellent. 07/12/2022: Both sites have contracted; the linear wound at the proximal aspect of [name] skin graft site is nearly closed. There is some slough and eschar in both locations. 07/19/2022: Both sites are smaller again today. The linear wound at the proximal aspect of [name] skin graft site is down to just a pinhole underneath a thin layer of eschar. The Achilles wound has some slough accumulation and some periwound skin irritation but no actual skin breakdown. The skin of [name] lower leg looks fairly irritated and almost as though [name] is having an eczema flare. 07/26/2022: The linear wound at the proximal aspect of [name] skin graft site has closed. The Achilles wound is quite a bit smaller this week and just has a little bit of slough on the surface. The skin of [name] leg looks significantly improved. 08/02/2022: Continued improvement in the appearance of [name]  periwound skin. The wound on [name] Achilles is down to just a few millimeters in each dimension under a layer of eschar. Edema control remains good with just the Ace bandage. 08/09/2022: [name] wound is healed. Patient History Information obtained from  Patient. Family History Unknown History. Social History Never smoker, Marital Status - Married, Alcohol Use - Never, Drug Use - No History, Caffeine Use - Never. Medical History Respiratory Patient has history of Asthma - Childhood asthma Cardiovascular Patient has history of Hypertension Oncologic Patient has history of Received Radiation - R/T Liposarcoma in 1972 Hospitalization/Surgery History - Left Breast Lumpectomy;Tonsillectomy ; Squamous cell carcinoma; Hysterectomy;Right Leg liposarcoma. Medical A Surgical History Notes nd Eyes Pt. states [name] has pre-glaucoma Cardiovascular Hx: Angio-edema r/t Bee venom; Hyperlipidemia Endocrine Hx: hypothyroidism Immunological Hx: Currently being treated with Immunotherapy (r/t Bee-Venom) Integumentary (Skin) Hx: Urticaria Musculoskeletal Hx: Osteoporosis Oncologic 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)-Biopsy taken to rule out squamous cell carcinoma on right arm and right leg (02/24/22), still pending results DEARDRA, HINKLEY ([id number]) [id number] Page 6 of 8 Objective Constitutional Hypertensive, asymptomatic. Bradycardic, asymptomatic. no acute distress. Vitals Time Taken: 7:46 AM, Height: 60 in, Weight: 126 lbs, BMI: 24.6, Temperature: 98 F, Pulse: 50 bpm, Respiratory Rate: 18 breaths/min, Blood Pressure: 150/75 mmHg. Respiratory Normal work of breathing on room air. General Notes: 08/09/2022: [name] wound is healed. Integumentary (Hair, Skin) Wound #1 status is Healed - Epithelialized. Original cause of wound was Gradually Appeared. The date acquired was: 08/30/2021. The wound has been in treatment 23 weeks. The wound is located on the Right Achilles. The wound measures 0cm length x 0cm width x 0cm depth; 0cm^2 area and 0cm^3 volume. There is no tunneling or undermining noted. There is a none present amount of drainage noted. The wound margin is distinct with the outline  attached to the wound base. There is no granulation within the wound bed. There is no necrotic tissue within the wound bed. The periwound skin appearance had no abnormalities noted for texture. The periwound skin appearance exhibited: Dry/Scaly, Rubor. The periwound skin appearance did not exhibit: Maceration, Atrophie Blanche, Cyanosis, Ecchymosis, Hemosiderin Staining, Mottled, Pallor, Erythema. Periwound temperature was noted as No Abnormality. Assessment Active Problems ICD-10 Non-pressure chronic ulcer of other part of right lower leg with fat layer exposed Essential (primary) hypertension Personal history of malignant neoplasm of soft tissue Personal history of irradiation Plan Discharge From [facility] Services: Discharge from Wound Care Center - Congratulations!!!!!!!!! Anesthetic: (In clinic) Topical Lidocaine 4% applied to wound bed Edema Control - Lymphedema / SCD / Other: Avoid standing for long periods of time. - Rest and elevate legs throughout the day. Moisturize legs daily. - Use a lotion or moisturizer for example :Sween,Eucerin;Aquaphor etc. The following medication(s) was prescribed: lidocaine topical 4 % cream cream topical was prescribed at facility 08/09/2022: [name] wound is healed. I did ask [name] to keep it covered with a light dressing for the next week to protect the freshly healed area from trauma and potential reopening. We will discharge [name] from the wound care center and [name] may follow-up as needed. Electronic Signature(s) Signed: 09/27/2022 10:27:51 AM By: Pearletha Alfred Signed: 09/27/2022 11:31:44 AM By: Duanne Guess MD FACS Previous Signature: 08/09/2022 8:02:36 AM Version By: Duanne Guess MD FACS Entered By: Pearletha Alfred on 09/27/2022 10:27:51 -------------------------------------------------------------------------------- HxROS Details Patient Name: Date of Service: [name], Merritt V. 08/09/2022 7:45 A M Medical Record Number: [id number] Patient Account Number:  [id number] Date of Birth/Sex: Treating RN: [birth year] (76  y.o. 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 Respiratory Medical History: Positive for: Asthma - Childhood asthma Cardiovascular Medical History: Positive for: Hypertension Past Medical History Notes: Hx: Angio-edema r/t Bee venom; Hyperlipidemia Endocrine Medical History: Past Medical History Notes: Hx: hypothyroidism Immunological Medical History: Past Medical History Notes: Hx: Currently being treated with Immunotherapy (r/t Bee-Venom) Integumentary (Skin) Medical History: Past Medical History Notes: Hx: Urticaria Musculoskeletal Medical History: Past Medical History Notes: Hx: Osteoporosis Oncologic Medical History: Positive for: Received Radiation - R/T Liposarcoma in 1972 Past Medical History Notes: 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)-Biopsy taken to rule out squamous cell carcinoma on right arm and right leg (02/24/22), still pending results Immunizations Pneumococcal Vaccine: Received Pneumococcal Vaccination: Yes Received Pneumococcal Vaccination On or After 60th Birthday: Yes Implantable Devices No devices added Hospitalization / Surgery History Type of Hospitalization/Surgery Left Breast Lumpectomy;Tonsillectomy ; Squamous cell carcinoma; Hysterectomy;Right Leg liposarcoma Family and Social History Unknown History: Yes; Never smoker; Marital Status - Married; Alcohol Use: Never; Drug Use: No History; Caffeine Use: Never; Financial Concerns: No; Food, Clothing or Shelter Needs: No; Support System Lacking: No; Transportation Concerns: No Electronic  Signature(s) Signed: 08/09/2022 12:20:18 PM By: Duanne Guess MD FACS Entered By: Duanne Guess on 08/09/2022 08:01:24 [name] ([id number]) [id number] Page 8 of 8 -------------------------------------------------------------------------------- SuperBill Details Patient Name: Date of Service: Misty ZAMANI, CROCKER 08/09/2022 Medical Record Number: [id number] Patient Account Number: [id number] Date of Birth/Sex: Treating RN: 1945/05/15 (77 y.o. F) Primary Care Provider: Willow Ora Other Clinician: Referring Provider: Treating Provider/Extender: Cresenciano Genre in Treatment: 23 Diagnosis Coding ICD-10 Codes Code Description [phone] Non-pressure chronic ulcer of other part of right lower leg with fat layer exposed I10 Essential (primary) hypertension Z85.831 Personal history of malignant neoplasm of soft tissue Z92.3 Personal history of irradiation Facility Procedures : CPT4 Code: [id number] Description: 99213 - WOUND CARE VISIT-LEV 3 EST PT Modifier: Quantity: 1 Physician Procedures : CPT4 Code Description Modifier [id number] - WC PHYS LEVEL 2 - EST PT ICD-10 Diagnosis Description L97.812 Non-pressure chronic ulcer of other part of right lower leg with fat layer exposed Z85.831 Personal history of malignant neoplasm of soft  tissue Z92.3 Personal history of irradiation I10 Essential (primary) hypertension Quantity: 1 Electronic Signature(s) Signed: 08/09/2022 12:20:18 PM By: Duanne Guess MD FACS Signed: 08/09/2022 3:15:40 PM By: Samuella Bruin Previous Signature: 08/09/2022 8:02:58 AM Version By: Duanne Guess MD FACS Entered By: Samuella Bruin on 08/09/2022 08:07:41

## 2022-09-27 NOTE — Progress Notes (Signed)
MIRELY, PANGLE ([id number]) [id number] Page 1 of 8 Visit Report for 04/12/2022 Arrival Information Details Patient Name: Date of Service: [name], [name] 04/12/2022 3:45 PM Medical Record Number: [id number] Patient Account Number: [id number] Date of Birth/Sex: Treating RN: [birth year] (77 y.o. F) Primary Care Zackaria Burkey: Willow Ora Other Clinician: Referring Arrian Manson: Treating Gail Vendetti/Extender: Cresenciano Genre in Treatment: 6 Visit Information History Since Last Visit Added or deleted any medications: No Patient Arrived: Ambulatory Any new allergies or adverse reactions: No Arrival Time: 15:46 Had a fall or experienced change in No Accompanied By: self activities of daily living that may affect Transfer Assistance: None risk of falls: Patient Identification Verified: Yes Signs or symptoms of abuse/neglect since last visito No Secondary Verification Process Completed: Yes Hospitalized since last visit: No Patient Requires Transmission-Based Precautions: No Implantable device outside of the clinic excluding No cellular tissue based products placed in the center since last visit: Has Dressing in Place as Prescribed: Yes Pain Present Now: No Electronic Signature(s) Signed: 09/27/2022 2:24:26 PM By: Karl Ito Entered By: Karl Ito on 04/12/2022 15:46:35 -------------------------------------------------------------------------------- Encounter Discharge Information Details Patient Name: Date of Service: [name], [name] 04/12/2022 3:45 PM Medical Record Number: [id number] Patient Account Number: [id number] Date of Birth/Sex: Treating RN: 08/27/1945 (77 y.o. [name] Primary Care Delita Chiquito: Willow Ora Other Clinician: Referring Jauna Raczynski: Treating Lakendrick Paradis/Extender: Cresenciano Genre in Treatment: 6 Encounter Discharge Information Items Post Procedure Vitals Discharge Condition: Stable Temperature (F):  98.2 Ambulatory Status: Ambulatory Pulse (bpm): 69 Discharge Destination: Home Respiratory Rate (breaths/min): 16 Transportation: Private Auto Blood Pressure (mmHg): 150/75 Accompanied By: self Schedule Follow-up Appointment: Yes Clinical Summary of Care: Patient Declined Electronic Signature(s) Signed: 04/12/2022 4:35:29 PM By: Samuella Bruin Entered By: Samuella Bruin on 04/12/2022 16:16:07 [name] ([id number]) [id number] Page 2 of 8 -------------------------------------------------------------------------------- Lower Extremity Assessment Details Patient Name: Date of Service: [name], [name] 04/12/2022 3:45 PM Medical Record Number: [id number] Patient Account Number: [id number] Date of Birth/Sex: Treating RN: [birth year] (77 y.o. [name] Primary Care Messi Twedt: Willow Ora Other Clinician: Referring Asyia Hornung: Treating Cozetta Seif/Extender: Elsie Saas Weeks in Treatment: 6 Edema Assessment Assessed: [Left: No] [Right: No] [Left: Edema] [Right: :] Calf Left: Right: Point of Measurement: 33 cm From Medial Instep 31.5 cm Ankle Left: Right: Point of Measurement: 9 cm From Medial Instep 19.5 cm Vascular Assessment Pulses: Dorsalis Pedis Palpable: [Right:Yes] Electronic Signature(s) Signed: 04/12/2022 4:35:29 PM By: Samuella Bruin Entered By: Samuella Bruin on 04/12/2022 16:03:13 -------------------------------------------------------------------------------- Multi Wound Chart Details Patient Name: Date of Service: [name] 04/12/2022 3:45 PM Medical Record Number: [id number] Patient Account Number: [id number] Date of Birth/Sex: Treating RN: 09-26-1945 (77 y.o. F) Primary Care Kesley Gaffey: Willow Ora Other Clinician: Referring Demichael Traum: Treating Sariah Henkin/Extender: Cresenciano Genre in Treatment: 6 Vital Signs Height(in): 60 Pulse(bpm): 69 Weight(lbs): 126 Blood Pressure(mmHg):  150/75 Body Mass Index(BMI): 24.6 Temperature(F): 98.2 Respiratory Rate(breaths/min): 16 [1:Photos:] [N/A:N/A [id number] Page 3 of 8] Right Achilles Right, Posterior Lower Leg N/A Wound Location: Gradually Appeared Shear/Friction N/A Wounding Event: Abscess T be determined o N/A Primary Etiology: Asthma, Hypertension, Received Asthma, Hypertension, Received N/A Comorbid History: Radiation Radiation 08/30/2021 04/08/2022 N/A Date Acquired: 6 0 N/A Weeks of Treatment: Open Open N/A Wound Status: No No N/A Wound Recurrence: 2.2x2.5x0.1 0.6x1.5x0.1 N/A Measurements [name] x W x D (cm) 4.32 0.707 N/A A (cm) : rea 0.432 0.071 N/A Volume (cm) : 35.30% N/A N/A % Reduction in A rea:  35.30% N/A N/A % Reduction in Volume: Full Thickness Without Exposed Full Thickness Without Exposed N/A Classification: Support Structures Support Structures Medium Medium N/A Exudate A mount: Serosanguineous Serosanguineous N/A Exudate Type: red, brown red, brown N/A Exudate Color: Distinct, outline attached Distinct, outline attached N/A Wound Margin: Medium (34-66%) Small (1-33%) N/A Granulation A mount: Red Red, Pink N/A Granulation Quality: Medium (34-66%) Large (67-100%) N/A Necrotic A mount: Fat Layer (Subcutaneous Tissue): Yes Fat Layer (Subcutaneous Tissue): Yes N/A Exposed Structures: Fascia: No Fascia: No Tendon: No Tendon: No Muscle: No Muscle: No Joint: No Joint: No Bone: No Bone: No Small (1-33%) None N/A Epithelialization: Debridement - Excisional Debridement - Selective/Open Wound N/A Debridement: Pre-procedure Verification/Time Out 16:11 16:11 N/A Taken: Lidocaine 4% Topical Solution Lidocaine 4% Topical Solution N/A Pain Control: Subcutaneous, [employer] N/A Tissue Debrided: Skin/Subcutaneous Tissue Non-Viable Tissue N/A Level: 5.5 0.9 N/A Debridement A (sq cm): rea Curette Curette N/A Instrument: Minimum Minimum  N/A Bleeding: Pressure Pressure N/A Hemostasis A chieved: Procedure was tolerated well Procedure was tolerated well N/A Debridement Treatment Response: 2.2x2.5x0.1 0.5x1.5x0.1 N/A Post Debridement Measurements [name] x W x D (cm) 0.432 0.059 N/A Post Debridement Volume: (cm) Scarring: Yes No Abnormalities Noted N/A Periwound Skin Texture: No Abnormalities Noted No Abnormalities Noted N/A Periwound Skin Moisture: Rubor: Yes Rubor: Yes N/A Periwound Skin Color: No Abnormality No Abnormality N/A Temperature: Debridement Debridement N/A Procedures Performed: Treatment Notes Wound #1 (Achilles) Wound Laterality: Right Cleanser Soap and Water Discharge Instruction: May shower and wash wound with dial antibacterial soap and water prior to dressing change. Wound Cleanser Discharge Instruction: Cleanse the wound with wound cleanser prior to applying a clean dressing using gauze sponges, not tissue or cotton balls. Peri-Wound Care Topical Primary Dressing Hydrofera Blue Ready Transfer Foam, 4x5 (in/in) Discharge Instruction: Apply to wound bed as instructed Secondary Dressing Zetuvit Plus Silicone Border Dressing 4x4 (in/in) Discharge Instruction: Apply silicone border over primary dressing as directed. Secured With Compression Wrap Compression Stockings Add-Ons Wound #2 (Lower Leg) Wound Laterality: Right, Posterior [name], [name] ([id number]) [id number] Page 4 of 8 Cleanser Soap and Water Discharge Instruction: May shower and wash wound with dial antibacterial soap and water prior to dressing change. Wound Cleanser Discharge Instruction: Cleanse the wound with wound cleanser prior to applying a clean dressing using gauze sponges, not tissue or cotton balls. Peri-Wound Care Topical Primary Dressing Sorbalgon AG Dressing 2x2 (in/in) Discharge Instruction: Apply to wound bed as instructed Secondary Dressing Zetuvit Plus Silicone Border Dressing 4x4  (in/in) Discharge Instruction: Apply silicone border over primary dressing as directed. Secured With Compression Wrap Compression Stockings Facilities manager) Signed: 04/12/2022 5:17:25 PM By: Duanne Guess MD FACS Entered By: Duanne Guess on 04/12/2022 17:17:24 -------------------------------------------------------------------------------- Multi-Disciplinary Care Plan Details Patient Name: Date of Service: Misty Val Eagle [name], [name] 04/12/2022 3:45 PM Medical Record Number: [id number] Patient Account Number: [id number] Date of Birth/Sex: Treating RN: 02-Mar-1946 (77 y.o. [name] Primary Care Anushri Casalino: Willow Ora Other Clinician: Referring Deloma Spindle: Treating Seddrick Flax/Extender: Cresenciano Genre in Treatment: 6 Active Inactive Wound/Skin Impairment Nursing Diagnoses: Knowledge deficit related to ulceration/compromised skin integrity Goals: Patient/caregiver will verbalize understanding of skin care regimen Date Initiated: 03/01/2022 Target Resolution Date: 06/02/2022 Goal Status: Active Interventions: Assess ulceration(s) every visit Treatment Activities: Skin care regimen initiated : 03/01/2022 Notes: Electronic Signature(s) Signed: 04/12/2022 4:35:29 PM By: Samuella Bruin Entered By: Samuella Bruin on 04/12/2022 16:14:48 [name] ([id number]) [id number] Page 5 of 8 -------------------------------------------------------------------------------- Pain Assessment Details Patient Name: Date of Service: [name], [name] 04/12/2022 3:45 PM Medical Record Number: [id number] Patient Account Number: [id number] Date of Birth/Sex: Treating RN: [birth year] (77 y.o. F) Primary Care Dynisha Due: Willow Ora Other Clinician: Referring Averey Koning: Treating Renelle Stegenga/Extender: Cresenciano Genre in Treatment: 6 Active Problems Location of Pain Severity and Description of Pain Patient Has Paino No Site  Locations Pain Management and Medication Current Pain Management: Electronic Signature(s) Signed: 09/27/2022 2:24:26 PM By: Karl Ito Entered By: Karl Ito on 04/12/2022 15:47:02 -------------------------------------------------------------------------------- Patient/Caregiver Education Details Patient Name: Date of Service: [name] 2/7/2024andnbsp3:45 PM Medical Record Number: [id number] Patient Account Number: [id number] Date of Birth/Gender: Treating RN: [birth year] (77 y.o. [name] Primary Care Physician: Willow Ora Other Clinician: Referring Physician: Treating Physician/Extender: Cresenciano Genre in Treatment: 6 Education Assessment Education Provided To: Patient Education Topics Provided Wound/Skin Impairment: Methods: Explain/Verbal Responses: Reinforcements needed, State content correctly NACOLE, FLUHR ([id number]) [id number] Page 6 of 8 Electronic Signature(s) Signed: 04/12/2022 4:35:29 PM By: Samuella Bruin Entered By: Samuella Bruin on 04/12/2022 16:15:03 -------------------------------------------------------------------------------- Wound Assessment Details Patient Name: Date of Service: [name], [name] 04/12/2022 3:45 PM Medical Record Number: [id number] Patient Account Number: [id number] Date of Birth/Sex: Treating RN: 08/04/1945 (77 y.o. F) Primary Care Trinitey Roache: Willow Ora Other Clinician: Referring Simran Bomkamp: Treating Renee Erb/Extender: Elsie Saas Weeks in Treatment: 6 Wound Status Wound Number: 1 Primary Etiology: Abscess Wound Location: Right Achilles Wound Status: Open Wounding Event: Gradually Appeared Comorbid History: Asthma, Hypertension, Received Radiation Date Acquired: 08/30/2021 Weeks Of Treatment: 6 Clustered Wound: No Photos Wound Measurements Length: (cm) 2.2 Width: (cm) 2.5 Depth: (cm) 0.1 Area: (cm) 4.32 Volume: (cm) 0.432 % Reduction in  Area: 35.3% % Reduction in Volume: 35.3% Epithelialization: Small (1-33%) Tunneling: No Undermining: No Wound Description Classification: Full Thickness Without Exposed Support Structures Wound Margin: Distinct, outline attached Exudate Amount: Medium Exudate Type: Serosanguineous Exudate Color: red, brown Foul Odor After Cleansing: No Slough/Fibrino Yes Wound Bed Granulation Amount: Medium (34-66%) Exposed Structure Granulation Quality: Red Fascia Exposed: No Necrotic Amount: Medium (34-66%) Fat Layer (Subcutaneous Tissue) Exposed: Yes Tendon Exposed: No Muscle Exposed: No Joint Exposed: No Bone Exposed: No Periwound Skin Texture Texture Color No Abnormalities Noted: Yes No Abnormalities Noted: No Rubor: Yes Moisture No Abnormalities Noted: Yes Temperature / Pain Temperature: No Abnormality [name], [name] ([id number]) [id number] Page 7 of 8 Electronic Signature(s) Signed: 04/12/2022 4:35:29 PM By: Samuella Bruin Entered By: Samuella Bruin on 04/12/2022 16:03:39 -------------------------------------------------------------------------------- Wound Assessment Details Patient Name: Date of Service: [name], [name] 04/12/2022 3:45 PM Medical Record Number: [id number] Patient Account Number: [id number] Date of Birth/Sex: Treating RN: [birth year] (77 y.o. F) Primary Care Timmya Blazier: Willow Ora Other Clinician: Referring Zahi Plaskett: Treating Jerlene Rockers/Extender: Elsie Saas Weeks in Treatment: 6 Wound Status Wound Number: 2 Primary T be determined o Etiology: Wound Location: Right, Posterior Lower Leg Wound Open Wounding Event: Shear/Friction Status: Date Acquired: 04/08/2022 Notes: Pt states she started feeling a little sensation under her wrap Weeks Of Treatment: 0 and now has a new place Clustered Wound: No Comorbid Asthma, Hypertension, Received Radiation History: Photos Wound Measurements Length: (cm) 0.6 Width: (cm)  1.5 Depth: (cm) 0.1 Area: (cm) 0.707 Volume: (cm) 0.071 % Reduction in Area: % Reduction in Volume: Epithelialization: None Tunneling: No Undermining: No Wound Description Classification: Full Thickness Without Exposed Support Structures Wound Margin: Distinct, outline attached Exudate Amount: Medium Exudate Type: Serosanguineous Exudate Color: red, brown Foul Odor After Cleansing: No Slough/Fibrino Yes Wound Bed Granulation  Amount: Small (1-33%) Exposed Structure Granulation Quality: Red, Pink Fascia Exposed: No Necrotic Amount: Large (67-100%) Fat Layer (Subcutaneous Tissue) Exposed: Yes Necrotic Quality: Adherent Slough Tendon Exposed: No Muscle Exposed: No Joint Exposed: No Bone Exposed: No Periwound Skin Texture Texture Color No Abnormalities Noted: Yes No Abnormalities Noted: No Rubor: [address] DONNELLE, RUBEY [name] ([id number]) [id number] Page 8 of 8 No Abnormalities Noted: Yes Temperature / Pain Temperature: No Abnormality Electronic Signature(s) Signed: 04/12/2022 4:35:29 PM By: Samuella Bruin Entered By: Samuella Bruin on 04/12/2022 16:04:26 -------------------------------------------------------------------------------- Vitals Details Patient Name: Date of Service: Misty Val Eagle [name], SHANTRELL PLACZEK 04/12/2022 3:45 PM Medical Record Number: [id number] Patient Account Number: [id number] Date of Birth/Sex: Treating RN: 07/08/1945 (77 y.o. F) Primary Care Mariaceleste Herrera: Willow Ora Other Clinician: Referring Chyna Kneece: Treating Davina Howlett/Extender: Cresenciano Genre in Treatment: 6 Vital Signs Time Taken: 15:46 Temperature (F): 98.2 Height (in): 60 Pulse (bpm): 69 Weight (lbs): 126 Respiratory Rate (breaths/min): 16 Body Mass Index (BMI): 24.6 Blood Pressure (mmHg): 150/75 Reference Range: 80 - 120 mg / dl Electronic Signature(s) Signed: 09/27/2022 2:24:26 PM By: Karl Ito Entered By: Karl Ito on 04/12/2022 15:46:57

## 2022-09-29 ENCOUNTER — Other Ambulatory Visit (HOSPITAL_COMMUNITY): Payer: Self-pay

## 2022-09-29 ENCOUNTER — Other Ambulatory Visit: Payer: Self-pay

## 2022-10-02 ENCOUNTER — Other Ambulatory Visit (HOSPITAL_COMMUNITY): Payer: Self-pay

## 2022-10-02 ENCOUNTER — Other Ambulatory Visit: Payer: Self-pay

## 2022-10-03 ENCOUNTER — Telehealth: Payer: Self-pay | Admitting: *Deleted

## 2022-10-03 NOTE — Telephone Encounter (Signed)
Prolia in fridge.  Pt has appt on 10/11/22.

## 2022-10-05 ENCOUNTER — Other Ambulatory Visit: Payer: Self-pay | Admitting: Internal Medicine

## 2022-10-11 ENCOUNTER — Ambulatory Visit (INDEPENDENT_AMBULATORY_CARE_PROVIDER_SITE_OTHER): Payer: Medicare HMO

## 2022-10-11 ENCOUNTER — Telehealth: Payer: Self-pay

## 2022-10-11 DIAGNOSIS — M810 Age-related osteoporosis without current pathological fracture: Secondary | ICD-10-CM

## 2022-10-11 MED ORDER — DENOSUMAB 60 MG/ML ~~LOC~~ SOSY
60.0000 mg | PREFILLED_SYRINGE | Freq: Once | SUBCUTANEOUS | Status: AC
Start: 2022-10-11 — End: 2022-10-11
  Administered 2022-10-11: 60 mg via SUBCUTANEOUS

## 2022-10-11 NOTE — Telephone Encounter (Signed)
Pt received Prolia injection today. Due on/after 04/13/2023.

## 2022-10-11 NOTE — Telephone Encounter (Signed)
-----   Message from [facility] S sent at 10/11/2022  9:04 AM EDT ----- Pt received Prolia shot today

## 2022-10-11 NOTE — Progress Notes (Signed)
Pt here for Prolia shot per Dr. Drue Novel. Shot given in left SubQ. Pt handled well.

## 2022-10-17 MED ORDER — DENOSUMAB 60 MG/ML ~~LOC~~ SOSY
60.0000 mg | PREFILLED_SYRINGE | Freq: Once | SUBCUTANEOUS | Status: AC
Start: 2022-10-17 — End: 2022-10-11
  Administered 2022-10-11: 60 mg via SUBCUTANEOUS

## 2022-10-17 NOTE — Addendum Note (Signed)
Addended by: Alysia Penna on: 10/17/2022 09:49 AM   Modules accepted: Orders

## 2022-10-29 ENCOUNTER — Telehealth: Payer: Medicare HMO | Admitting: Nurse Practitioner

## 2022-10-29 DIAGNOSIS — U071 COVID-19: Secondary | ICD-10-CM | POA: Diagnosis not present

## 2022-10-29 NOTE — Patient Instructions (Signed)
[name], thank you for joining Claiborne Rigg, NP for today's virtual visit.  While this provider is not your primary care provider (PCP), if your PCP is located in our provider database this encounter information will be shared with them immediately following your visit.   A [facility] MyChart account gives you access to today's visit and all your visits, tests, and labs performed at [facility] " click here if you don't have a [facility] MyChart account or go to [email or web address]  Consent: (Patient) [name] provided verbal consent for this virtual visit at the beginning of the encounter.  Current Medications:  Current Outpatient Medications:    amLODipine (NORVASC) 10 MG tablet, Take 1 tablet (10 mg total) by mouth daily., Disp: 90 tablet, Rfl: 1   atorvastatin (LIPITOR) 20 MG tablet, Take 1 tablet (20 mg total) by mouth daily., Disp: 90 tablet, Rfl: 1   calcium citrate-vitamin D (CITRACAL+D) 315-200 MG-UNIT per tablet, Take 1 tablet by mouth. 600 mg bid, Disp: , Rfl:    cholecalciferol (VITAMIN D) 1000 units tablet, Take 1,000 Units by mouth daily., Disp: , Rfl:    denosumab (PROLIA) 60 MG/ML SOSY injection, Pt will get at [facility] on 10/11/22, Disp: 1 mL, Rfl: 0   EPINEPHrine 0.3 mg/0.3 mL IJ SOAJ injection, Inject 0.3 mg into the muscle as needed for anaphylaxis., Disp: 2 each, Rfl: 1   levothyroxine (SYNTHROID) 50 MCG tablet, TAKE 1 TABLET BY MOUTH DAILY BEFORE BREAKFAST, Disp: 90 tablet, Rfl: 1   metoprolol succinate (TOPROL-XL) 25 MG 24 hr tablet, Take 1 tablet (25 mg total) by mouth daily. Take with or immediately following a meal, Disp: 90 tablet, Rfl: 1   Multiple Vitamin (MULTIVITAMIN) tablet, Take 1 tablet by mouth daily., Disp: , Rfl:    Medications ordered in this encounter:  No orders of the defined types were placed in this encounter.    *If you need refills on other medications prior to your next appointment, please contact your  pharmacy*  Follow-Up: Call back or seek an in-person evaluation if the symptoms worsen or if the condition fails to improve as anticipated.  [facility] [phone]  Other Instructions  Please keep well-hydrated and get plenty of rest. Start a saline nasal rinse to flush out your nasal passages. You can use plain Mucinex to help thin congestion. If you have a humidifier, you can use this daily as needed.    You are to wear a mask for 5 days from onset of your symptoms.  After day 5, if you have had no fever and you are feeling better with NO symptoms, you can end masking. Keep in mind you can be contagious 10 days from the onset of symptoms  After day 5 if you have a fever or are having significant symptoms, please wear your mask for full 10 days.   If you note any worsening of symptoms, any significant shortness of breath or any chest pain, please seek ER evaluation ASAP.  Please do not delay care!    If you note any worsening of symptoms, any significant shortness of breath or any chest pain, please seek ER evaluation ASAP.  Please do not delay care!    If you have been instructed to have an in-person evaluation today at a local Urgent Care facility, please use the link below. It will take you to a list of all of our available [facility], including address, phone number  and hours of operation. Please do not delay care.  [facility]  If you or a family member do not have a primary care provider, use the link below to schedule a visit and establish care. When you choose a [facility] primary care physician or advanced practice provider, you gain a long-term partner in health. Find a Primary Care Provider  Learn more about [facility] in-office and virtual care options: [facility] - Get Care Now

## 2022-10-29 NOTE — Progress Notes (Signed)
Virtual Visit Consent   [name], you are scheduled for a virtual visit with a [facility] provider today. Just as with appointments in the office, your consent must be obtained to participate. Your consent will be active for this visit and any virtual visit you may have with one of our providers in the next 365 days. If you have a MyChart account, a copy of this consent can be sent to you electronically.  As this is a virtual visit, video technology does not allow for your provider to perform a traditional examination. This may limit your provider's ability to fully assess your condition. If your provider identifies any concerns that need to be evaluated in person or the need to arrange testing (such as labs, EKG, etc.), we will make arrangements to do so. Although advances in technology are sophisticated, we cannot ensure that it will always work on either your end or our end. If the connection with a video visit is poor, the visit may have to be switched to a telephone visit. With either a video or telephone visit, we are not always able to ensure that we have a secure connection.  By engaging in this virtual visit, you consent to the provision of healthcare and authorize for your insurance to be billed (if applicable) for the services provided during this visit. Depending on your insurance coverage, you may receive a charge related to this service.  I need to obtain your verbal consent now. Are you willing to proceed with your visit today? [name] has provided verbal consent on 10/29/2022 for a virtual visit (video or telephone). Misty Rigg, NP  Date: 10/29/2022 12:19 PM  Virtual Visit via Video Note   I, [name], connected with  [name]  ([id number], Jul 29, 1945) on 10/29/22 at 12:00 PM EDT by a video-enabled telemedicine application and verified that I am speaking with the correct person using two identifiers.  Location: Patient: Virtual Visit Location Patient:  Home Provider: Virtual Visit Location Provider: Home Office   I discussed the limitations of evaluation and management by telemedicine and the availability of in person appointments. The patient expressed understanding and agreed to proceed.    History of Present Illness: [name] is a 78 y.o. who identifies as a female who was assigned female at birth, and is being seen today for COVID POSITIVE.  [name] Tested positive for COVID this morning.  Wednesday evening exposed. Symptoms runny nose, Temp 98.2 days ago she began to have symptoms of runny nose and diarreha. Tmax 9.8. She does not endorse chest pain, cough, headache, or sore throat.    Problems:  Patient Active Problem List   Diagnosis Date Noted   Toxic effect of venom of bees, unintentional 09/22/2022   SCC (squamous cell carcinoma) 09/21/2021   Other adverse food reactions, not elsewhere classified, subsequent encounter 07/07/2020   Hymenoptera allergy 01/07/2020   Pruritus 01/07/2020   Seasonal and perennial allergic rhinitis 01/07/2020   DJD (degenerative joint disease) 10/12/2016   PCP NOTES >>>>>> 02/09/2015   Liposarcoma (HCC)    Annual physical exam 01/02/2011   Hypothyroidism 08/01/2006   Hyperlipidemia 08/01/2006   Essential hypertension 08/01/2006   Osteoporosis 08/01/2006    Allergies:  Allergies  Allergen Reactions   Bee Venom Anaphylaxis    Wasp, yellow jacket, hornet   Promethazine Hcl     Other reaction(s): Unknown   Thimerosal (Thiomersal) Itching   Moxifloxacin Palpitations   Medications:  Current Outpatient  Medications:    amLODipine (NORVASC) 10 MG tablet, Take 1 tablet (10 mg total) by mouth daily., Disp: 90 tablet, Rfl: 1   atorvastatin (LIPITOR) 20 MG tablet, Take 1 tablet (20 mg total) by mouth daily., Disp: 90 tablet, Rfl: 1   calcium citrate-vitamin D (CITRACAL+D) 315-200 MG-UNIT per tablet, Take 1 tablet by mouth. 600 mg bid, Disp: , Rfl:    cholecalciferol (VITAMIN D) 1000 units  tablet, Take 1,000 Units by mouth daily., Disp: , Rfl:    denosumab (PROLIA) 60 MG/ML SOSY injection, Pt [facility] at [facility], Disp: 1 mL, Rfl: 0   EPINEPHrine 0.3 mg/0.3 mL IJ SOAJ injection, Inject 0.3 mg into the muscle as needed for anaphylaxis., Disp: 2 each, Rfl: 1   levothyroxine (SYNTHROID) 50 MCG tablet, TAKE 1 TABLET BY MOUTH DAILY BEFORE BREAKFAST, Disp: 90 tablet, Rfl: 1   metoprolol succinate (TOPROL-XL) 25 MG 24 hr tablet, Take 1 tablet (25 mg total) by mouth daily. Take with or immediately following a meal, Disp: 90 tablet, Rfl: 1   Multiple Vitamin (MULTIVITAMIN) tablet, Take 1 tablet by mouth daily., Disp: , Rfl:   Observations/Objective: Patient is well-developed, well-nourished in no acute distress.  Resting comfortably at home.  Head is normocephalic, atraumatic.  No labored breathing.  Speech is clear and coherent with logical content.  Patient is alert and oriented at baseline.    Assessment and Plan: 1. Positive self-administered antigen test for COVID-19   Please keep well-hydrated and get plenty of rest. Start a saline nasal rinse to flush out your nasal passages. You can use plain Mucinex to help thin congestion. If you have a humidifier, you can use this daily as needed.    You are to wear a mask for 5 days from onset of your symptoms.  After day 5, if you have had no fever and you are feeling better with NO symptoms, you can end masking. Keep in mind you can be contagious 10 days from the onset of symptoms  After day 5 if you have a fever or are having significant symptoms, please wear your mask for full 10 days.   If you note any worsening of symptoms, any significant shortness of breath or any chest pain, please seek ER evaluation ASAP.  Please do not delay care!    If you note any worsening of symptoms, any significant shortness of breath or any chest pain, please seek ER evaluation ASAP.  Please do not delay care!   Follow Up  Instructions: I discussed the assessment and treatment plan with the patient. The patient was provided an opportunity to ask questions and all were answered. The patient agreed with the plan and demonstrated an understanding of the instructions.  A copy of instructions were sent to the patient via MyChart unless otherwise noted below.     The patient was advised to call back or seek an in-person evaluation if the symptoms worsen or if the condition fails to improve as anticipated.  Time:  I spent 12 minutes with the patient via telehealth technology discussing the above problems/concerns.    Misty Rigg, NP

## 2022-10-31 ENCOUNTER — Encounter: Payer: Self-pay | Admitting: Internal Medicine

## 2022-11-01 ENCOUNTER — Ambulatory Visit (INDEPENDENT_AMBULATORY_CARE_PROVIDER_SITE_OTHER): Payer: Medicare HMO

## 2022-11-01 DIAGNOSIS — T63441D Toxic effect of venom of bees, accidental (unintentional), subsequent encounter: Secondary | ICD-10-CM | POA: Diagnosis not present

## 2022-11-07 DIAGNOSIS — C44729 Squamous cell carcinoma of skin of left lower limb, including hip: Secondary | ICD-10-CM | POA: Diagnosis not present

## 2022-11-07 DIAGNOSIS — L820 Inflamed seborrheic keratosis: Secondary | ICD-10-CM | POA: Diagnosis not present

## 2022-11-07 DIAGNOSIS — D485 Neoplasm of uncertain behavior of skin: Secondary | ICD-10-CM | POA: Diagnosis not present

## 2022-11-07 DIAGNOSIS — D225 Melanocytic nevi of trunk: Secondary | ICD-10-CM | POA: Diagnosis not present

## 2022-11-07 DIAGNOSIS — L209 Atopic dermatitis, unspecified: Secondary | ICD-10-CM | POA: Diagnosis not present

## 2022-11-07 DIAGNOSIS — L578 Other skin changes due to chronic exposure to nonionizing radiation: Secondary | ICD-10-CM | POA: Diagnosis not present

## 2022-11-07 DIAGNOSIS — D2271 Melanocytic nevi of right lower limb, including hip: Secondary | ICD-10-CM | POA: Diagnosis not present

## 2022-11-07 DIAGNOSIS — L570 Actinic keratosis: Secondary | ICD-10-CM | POA: Diagnosis not present

## 2022-11-07 DIAGNOSIS — D2272 Melanocytic nevi of left lower limb, including hip: Secondary | ICD-10-CM | POA: Diagnosis not present

## 2022-11-07 DIAGNOSIS — Z808 Family history of malignant neoplasm of other organs or systems: Secondary | ICD-10-CM | POA: Diagnosis not present

## 2022-11-07 DIAGNOSIS — D2262 Melanocytic nevi of left upper limb, including shoulder: Secondary | ICD-10-CM | POA: Diagnosis not present

## 2022-11-07 DIAGNOSIS — D2261 Melanocytic nevi of right upper limb, including shoulder: Secondary | ICD-10-CM | POA: Diagnosis not present

## 2022-11-09 ENCOUNTER — Other Ambulatory Visit: Payer: Self-pay | Admitting: Internal Medicine

## 2022-11-23 ENCOUNTER — Other Ambulatory Visit: Payer: Self-pay | Admitting: Internal Medicine

## 2022-11-25 ENCOUNTER — Encounter (HOSPITAL_COMMUNITY): Payer: Self-pay

## 2022-12-05 ENCOUNTER — Encounter: Payer: Self-pay | Admitting: Internal Medicine

## 2022-12-12 DIAGNOSIS — C44729 Squamous cell carcinoma of skin of left lower limb, including hip: Secondary | ICD-10-CM | POA: Diagnosis not present

## 2022-12-13 ENCOUNTER — Ambulatory Visit (INDEPENDENT_AMBULATORY_CARE_PROVIDER_SITE_OTHER): Payer: Medicare HMO

## 2022-12-13 DIAGNOSIS — T63441D Toxic effect of venom of bees, accidental (unintentional), subsequent encounter: Secondary | ICD-10-CM

## 2022-12-20 DIAGNOSIS — H04123 Dry eye syndrome of bilateral lacrimal glands: Secondary | ICD-10-CM | POA: Diagnosis not present

## 2022-12-20 DIAGNOSIS — H40023 Open angle with borderline findings, high risk, bilateral: Secondary | ICD-10-CM | POA: Diagnosis not present

## 2022-12-20 DIAGNOSIS — H5213 Myopia, bilateral: Secondary | ICD-10-CM | POA: Diagnosis not present

## 2022-12-20 DIAGNOSIS — H524 Presbyopia: Secondary | ICD-10-CM | POA: Diagnosis not present

## 2022-12-20 DIAGNOSIS — H43813 Vitreous degeneration, bilateral: Secondary | ICD-10-CM | POA: Diagnosis not present

## 2022-12-20 DIAGNOSIS — H2513 Age-related nuclear cataract, bilateral: Secondary | ICD-10-CM | POA: Diagnosis not present

## 2022-12-20 DIAGNOSIS — H52221 Regular astigmatism, right eye: Secondary | ICD-10-CM | POA: Diagnosis not present

## 2023-01-13 ENCOUNTER — Encounter: Payer: Self-pay | Admitting: Internal Medicine

## 2023-01-15 ENCOUNTER — Other Ambulatory Visit (HOSPITAL_BASED_OUTPATIENT_CLINIC_OR_DEPARTMENT_OTHER): Payer: Self-pay

## 2023-01-15 MED ORDER — INFLUENZA VIRUS VACC SPLIT PF (FLUZONE) 0.5 ML IM SUSY
0.5000 mL | PREFILLED_SYRINGE | Freq: Once | INTRAMUSCULAR | 0 refills | Status: DC
  Filled 2023-01-15: qty 0.5, 1d supply, fill #0

## 2023-01-15 MED ORDER — COVID-19 MRNA VAC-TRIS(PFIZER) 30 MCG/0.3ML IM SUSY
0.3000 mL | PREFILLED_SYRINGE | Freq: Once | INTRAMUSCULAR | 0 refills | Status: DC
  Filled 2023-01-15: qty 0.3, 1d supply, fill #0

## 2023-01-24 ENCOUNTER — Ambulatory Visit: Payer: Medicare HMO

## 2023-01-24 DIAGNOSIS — Z1231 Encounter for screening mammogram for malignant neoplasm of breast: Secondary | ICD-10-CM | POA: Diagnosis not present

## 2023-01-24 LAB — HM MAMMOGRAPHY

## 2023-01-25 ENCOUNTER — Ambulatory Visit (INDEPENDENT_AMBULATORY_CARE_PROVIDER_SITE_OTHER): Payer: Medicare HMO

## 2023-01-25 DIAGNOSIS — T63441D Toxic effect of venom of bees, accidental (unintentional), subsequent encounter: Secondary | ICD-10-CM | POA: Diagnosis not present

## 2023-02-05 ENCOUNTER — Ambulatory Visit (INDEPENDENT_AMBULATORY_CARE_PROVIDER_SITE_OTHER): Payer: Medicare HMO | Admitting: Internal Medicine

## 2023-02-05 ENCOUNTER — Encounter: Payer: Self-pay | Admitting: Internal Medicine

## 2023-02-05 VITALS — BP 128/80 | HR 61 | Temp 97.5°F | Resp 16 | Ht 60.0 in | Wt 126.0 lb

## 2023-02-05 DIAGNOSIS — E039 Hypothyroidism, unspecified: Secondary | ICD-10-CM | POA: Diagnosis not present

## 2023-02-05 DIAGNOSIS — I10 Essential (primary) hypertension: Secondary | ICD-10-CM

## 2023-02-05 NOTE — Assessment & Plan Note (Signed)
HTN: BP today is very good, continue amlodipine, metoprolol, check a BMP and CBC Hypothyroidism: On levothyroxine, check TSH. High cholesterol: Controlled on atorvastatin. Skin cancer: Closely follow-up by dermatology. Preventive care: Had recent COVID and flu shots. RTC 3 months CPX

## 2023-02-05 NOTE — Progress Notes (Signed)
   Subjective:    Patient ID: [name], female    DOB: 26-Nov-1945, 77 y.o.   MRN: [id number]  DOS:  02/05/2023 Type of visit - description: rov  Since the last office visit is doing well. Has no major concerns. Did have an additional Mohs surgery on the left foot.   Review of Systems See above   Past Medical History:  Diagnosis Date   Allergy    Angio-edema    Arthritis    Asthma    Cataract    Hyperlipidemia    Hypertension    Hypothyroidism    Liposarcoma (HCC) 1972   r leg   Osteoporosis    SCC (squamous cell carcinoma)    Urticaria     Past Surgical History:  Procedure Laterality Date   ABDOMINAL HYSTERECTOMY     no oophorectomy   BREAST LUMPECTOMY     x2 left breast   R leg liposarcoma  1973   SQUAMOUS CELL CARCINOMA EXCISION     RLE   TONSILLECTOMY      Current Outpatient Medications  Medication Instructions   amLODipine (NORVASC) 10 mg, Oral, Daily   atorvastatin (LIPITOR) 20 mg, Oral, Daily   calcium citrate-vitamin D (CITRACAL+D) 315-200 MG-UNIT per tablet 1 tablet   cholecalciferol (VITAMIN D) 1,000 Units, Oral, Daily   denosumab (PROLIA) 60 MG/ML SOSY injection Pt [facility] on 10[facility]11/22   EPINEPHrine (EPI-PEN) 0.3 mg, Intramuscular, As needed   levothyroxine (SYNTHROID) 50 mcg, Oral, Daily before breakfast   metoprolol succinate (TOPROL-XL) 25 mg, Oral, Daily, Take with or immediately following a meal   Multiple Vitamin (MULTIVITAMIN) tablet 1 tablet, Oral, Daily,     VYZULTA 0.024 % SOLN 1 drop, Both Eyes, Daily at bedtime       Objective:   Physical Exam BP 128/80   Pulse 61   Temp (!) 97.5 F (36.4 C) (Oral)   Resp 16   Ht 5' (1.524 m)   Wt 126 lb (57.2 kg)   SpO2 97%   BMI 24.61 kg/m  .5peba    Assessment     Assessment HTN Hyperlipidemia Hypothyroidism Osteoporosis  T score 2013: -2.6,  T score 02-2014  -2.5 after  Reclast x 3. Last reclast 05-2013. T score 02-2016: -2.4. T score -2.6 (04-2018),  Rx  Prolia. Tscore 06/2021: - 2.3 (better ) --Normal Vit D 2014 SCC, skin cancer, right lower extremity, sees derm q 6 months Liposarcoma right leg 1972 (near ankle, posteriorly)   PLAN: HTN: BP today is very good, continue amlodipine, metoprolol, check a BMP and CBC Hypothyroidism: On levothyroxine, check TSH. High cholesterol: Controlled on atorvastatin. Skin cancer: Closely follow-up by dermatology. Preventive care: Had recent COVID and flu shots. RTC 3 months CPX

## 2023-02-05 NOTE — Patient Instructions (Signed)
  Check the  blood pressure regularly Blood pressure goal:  between 110/65 and  135/85. If it is consistently higher or lower, let me know     GO TO THE LAB : Get the blood work     Next visit with me in 3 months for a physical exam Please schedule it at the front desk

## 2023-02-06 LAB — CBC WITH DIFFERENTIAL/PLATELET
Basophils Absolute: 0 10*3/uL (ref 0.0–0.1)
Basophils Relative: 0.5 % (ref 0.0–3.0)
Eosinophils Absolute: 0.1 10*3/uL (ref 0.0–0.7)
Eosinophils Relative: 1.2 % (ref 0.0–5.0)
HCT: 41.7 % (ref 36.0–46.0)
Hemoglobin: 13.9 g/dL (ref 12.0–15.0)
Lymphocytes Relative: 23 % (ref 12.0–46.0)
Lymphs Abs: 1.9 10*3/uL (ref 0.7–4.0)
MCHC: 33.4 g/dL (ref 30.0–36.0)
MCV: 90.9 fL (ref 78.0–100.0)
Monocytes Absolute: 0.6 10*3/uL (ref 0.1–1.0)
Monocytes Relative: 7.7 % (ref 3.0–12.0)
Neutro Abs: 5.5 10*3/uL (ref 1.4–7.7)
Neutrophils Relative %: 67.6 % (ref 43.0–77.0)
Platelets: 343 10*3/uL (ref 150.0–400.0)
RBC: 4.59 Mil/uL (ref 3.87–5.11)
RDW: 13.6 % (ref 11.5–15.5)
WBC: 8.2 10*3/uL (ref 4.0–10.5)

## 2023-02-06 LAB — TSH: TSH: 0.79 u[IU]/mL (ref 0.35–5.50)

## 2023-02-06 LAB — BASIC METABOLIC PANEL
BUN: 16 mg/dL (ref 6–23)
CO2: 29 meq/L (ref 19–32)
Calcium: 9.7 mg/dL (ref 8.4–10.5)
Chloride: 100 meq/L (ref 96–112)
Creatinine, Ser: 0.8 mg/dL (ref 0.40–1.20)
GFR: 71.06 mL/min (ref >60.00)
Glucose, Bld: 86 mg/dL (ref 70–99)
Potassium: 4 meq/L (ref 3.5–5.1)
Sodium: 139 meq/L (ref 135–145)

## 2023-03-08 ENCOUNTER — Ambulatory Visit: Payer: Medicare HMO

## 2023-03-08 DIAGNOSIS — T63441D Toxic effect of venom of bees, accidental (unintentional), subsequent encounter: Secondary | ICD-10-CM | POA: Diagnosis not present

## 2023-03-12 ENCOUNTER — Other Ambulatory Visit: Payer: Self-pay | Admitting: Internal Medicine

## 2023-03-13 ENCOUNTER — Other Ambulatory Visit: Payer: Self-pay

## 2023-03-13 ENCOUNTER — Telehealth: Payer: Self-pay

## 2023-03-13 ENCOUNTER — Other Ambulatory Visit (HOSPITAL_COMMUNITY): Payer: Self-pay

## 2023-03-13 DIAGNOSIS — M810 Age-related osteoporosis without current pathological fracture: Secondary | ICD-10-CM

## 2023-03-13 MED ORDER — DENOSUMAB 60 MG/ML ~~LOC~~ SOSY
60.0000 mg | PREFILLED_SYRINGE | Freq: Once | SUBCUTANEOUS | Status: AC
  Administered 2023-04-17: 60 mg via SUBCUTANEOUS

## 2023-03-13 NOTE — Telephone Encounter (Signed)
 Prolia was done 10/11/2022 Next is due around 04/13/2023 CAM placed. Please check PA/Cost.

## 2023-03-13 NOTE — Telephone Encounter (Signed)
 Prolia VOB initiated via [email or web address]  Next Prolia inj DUE: 04/10/23

## 2023-03-14 ENCOUNTER — Other Ambulatory Visit (HOSPITAL_COMMUNITY): Payer: Self-pay

## 2023-03-14 NOTE — Telephone Encounter (Signed)
 Marland Kitchen

## 2023-03-14 NOTE — Telephone Encounter (Signed)
 Pt ready for scheduling for PROLIA  on or after : 04/10/23  Out-of-pocket cost due at time of visit: $348  Number of injection/visits approved: 2  Primary: [employer] Prolia  co-insurance: 20% Admin fee co-insurance: 20%  Secondary: --- Prolia  co-insurance:  Admin fee co-insurance:   Medical Benefit Details: Date Benefits were checked: 03/13/23 Deductible: NO/ Coinsurance: 20%/ Admin Fee: 20%  Prior Auth: APPROVED PA# [id number] Expiration Date: 09/04/22-09/04/23  # of doses approved: 2  Pharmacy benefit: Copay $--- (refill too soon, next fill date 03/31/23) If patient wants fill through the pharmacy benefit please send prescription to: [employer], and include estimated need by date in rx notes. Pharmacy will ship medication directly to the office.  Patient not eligible for Prolia  Copay Card. Copay Card can make patient's cost as little as $25. Link to apply: https://www.amgensupportplus.com/copay  ** This summary of benefits is an estimation of the patient's out-of-pocket cost. Exact cost may very based on individual plan coverage.

## 2023-03-20 ENCOUNTER — Other Ambulatory Visit: Payer: Self-pay

## 2023-03-22 NOTE — Telephone Encounter (Signed)
Pt is due on or around 04/14/23 and she gets from pharmacy.  Will send to pharmacy on 03/31/23.

## 2023-03-23 ENCOUNTER — Other Ambulatory Visit: Payer: Self-pay

## 2023-03-23 ENCOUNTER — Telehealth: Payer: Self-pay | Admitting: Emergency Medicine

## 2023-03-23 NOTE — Telephone Encounter (Signed)
Copied from CRM [id number]. Topic: General - Other >> Mar 23, 2023  2:50 PM Fredrich Romans wrote: Reason for CRM: patient is due for her prolia injection,she would like to have ordered,and once injection comes in she would lie to be scheduled

## 2023-03-23 NOTE — Telephone Encounter (Signed)
Injection has been ordered. Pt will be called once received.

## 2023-03-29 ENCOUNTER — Other Ambulatory Visit (HOSPITAL_COMMUNITY): Payer: Self-pay

## 2023-04-02 ENCOUNTER — Other Ambulatory Visit: Payer: Self-pay

## 2023-04-02 MED ORDER — DENOSUMAB 60 MG/ML ~~LOC~~ SOSY
60.0000 mg | PREFILLED_SYRINGE | SUBCUTANEOUS | 0 refills | Status: DC
  Filled 2023-04-02 – 2023-04-03 (×2): qty 1, 180d supply, fill #0

## 2023-04-02 NOTE — Addendum Note (Signed)
Addended by: Thelma Barge D on: 04/02/2023 10:23 AM   Modules accepted: Orders

## 2023-04-02 NOTE — Telephone Encounter (Signed)
Pt scheduled for 04/17/23.  Rx sent to [facility].

## 2023-04-03 ENCOUNTER — Encounter (HOSPITAL_COMMUNITY): Payer: Self-pay

## 2023-04-03 ENCOUNTER — Other Ambulatory Visit: Payer: Self-pay

## 2023-04-03 ENCOUNTER — Other Ambulatory Visit (HOSPITAL_COMMUNITY): Payer: Self-pay

## 2023-04-03 NOTE — Progress Notes (Signed)
04/03/23 CA: Prolia  Ok to fill 02/05. Patient will pay [place] for the $645 copay.

## 2023-04-03 NOTE — Progress Notes (Signed)
Specialty Pharmacy Refill Coordination Note  [name] is a 78 y.o. female contacted today regarding refills of specialty medication(s) No medications found.   Patient requested Courier to Provider Office   Delivery date: 04/12/23   Verified address: [address], [address], [place], [place] [address]   Medication will be filled on 04/11/23.  Pending payment*  Pt is aware of [phone] copay and is deciding if they want to do the medicare payment plan or call back with a card.

## 2023-04-05 ENCOUNTER — Other Ambulatory Visit: Payer: Self-pay | Admitting: Internal Medicine

## 2023-04-05 ENCOUNTER — Ambulatory Visit: Payer: Medicare HMO

## 2023-04-11 ENCOUNTER — Other Ambulatory Visit (HOSPITAL_COMMUNITY): Payer: Self-pay

## 2023-04-11 ENCOUNTER — Other Ambulatory Visit: Payer: Self-pay

## 2023-04-17 ENCOUNTER — Ambulatory Visit: Payer: Medicare HMO | Admitting: *Deleted

## 2023-04-17 ENCOUNTER — Telehealth: Payer: Self-pay | Admitting: *Deleted

## 2023-04-17 DIAGNOSIS — M810 Age-related osteoporosis without current pathological fracture: Secondary | ICD-10-CM | POA: Diagnosis not present

## 2023-04-17 MED ORDER — DENOSUMAB 60 MG/ML ~~LOC~~ SOSY
60.0000 mg | PREFILLED_SYRINGE | Freq: Once | SUBCUTANEOUS | Status: AC
  Administered 2023-10-16 (×2): 60 mg via SUBCUTANEOUS

## 2023-04-17 NOTE — Telephone Encounter (Signed)
Pt received Prolia injection today.  Pt supplied.

## 2023-04-17 NOTE — Telephone Encounter (Signed)
Rx is in fridge

## 2023-04-17 NOTE — Progress Notes (Signed)
Pt here for Prolia injection per Dr. Drue Novel. Given in right arm, subcutaneous. No complications.

## 2023-04-17 NOTE — Telephone Encounter (Signed)
CAM order placed 04/17/23

## 2023-04-19 ENCOUNTER — Ambulatory Visit (INDEPENDENT_AMBULATORY_CARE_PROVIDER_SITE_OTHER): Payer: Medicare HMO

## 2023-04-19 DIAGNOSIS — T63441D Toxic effect of venom of bees, accidental (unintentional), subsequent encounter: Secondary | ICD-10-CM

## 2023-04-20 ENCOUNTER — Other Ambulatory Visit: Payer: Self-pay

## 2023-04-25 DIAGNOSIS — D225 Melanocytic nevi of trunk: Secondary | ICD-10-CM | POA: Diagnosis not present

## 2023-04-25 DIAGNOSIS — L570 Actinic keratosis: Secondary | ICD-10-CM | POA: Diagnosis not present

## 2023-04-25 DIAGNOSIS — D2272 Melanocytic nevi of left lower limb, including hip: Secondary | ICD-10-CM | POA: Diagnosis not present

## 2023-04-25 DIAGNOSIS — D2271 Melanocytic nevi of right lower limb, including hip: Secondary | ICD-10-CM | POA: Diagnosis not present

## 2023-04-25 DIAGNOSIS — Z808 Family history of malignant neoplasm of other organs or systems: Secondary | ICD-10-CM | POA: Diagnosis not present

## 2023-04-25 DIAGNOSIS — D2262 Melanocytic nevi of left upper limb, including shoulder: Secondary | ICD-10-CM | POA: Diagnosis not present

## 2023-04-25 DIAGNOSIS — L209 Atopic dermatitis, unspecified: Secondary | ICD-10-CM | POA: Diagnosis not present

## 2023-04-25 DIAGNOSIS — D485 Neoplasm of uncertain behavior of skin: Secondary | ICD-10-CM | POA: Diagnosis not present

## 2023-04-25 DIAGNOSIS — L578 Other skin changes due to chronic exposure to nonionizing radiation: Secondary | ICD-10-CM | POA: Diagnosis not present

## 2023-05-02 ENCOUNTER — Telehealth: Payer: Self-pay | Admitting: Internal Medicine

## 2023-05-02 NOTE — Telephone Encounter (Signed)
 Copied from CRM [id number]. Topic: Medicare AWV >> May 02, 2023 10:21 AM Payton Doughty wrote: Reason for CRM: Called LVM 05/02/2023 to schedule AWV. Please schedule Virtual or Telehealth visits ONLY.   Verlee Rossetti; Care Guide Ambulatory Clinical Support [place] [facility] Direct Dial: [phone]

## 2023-05-09 ENCOUNTER — Other Ambulatory Visit: Payer: Self-pay | Admitting: Internal Medicine

## 2023-05-25 ENCOUNTER — Other Ambulatory Visit: Payer: Self-pay | Admitting: Internal Medicine

## 2023-05-30 ENCOUNTER — Ambulatory Visit (INDEPENDENT_AMBULATORY_CARE_PROVIDER_SITE_OTHER): Payer: Medicare HMO

## 2023-05-30 DIAGNOSIS — T63441D Toxic effect of venom of bees, accidental (unintentional), subsequent encounter: Secondary | ICD-10-CM | POA: Diagnosis not present

## 2023-06-13 ENCOUNTER — Ambulatory Visit (INDEPENDENT_AMBULATORY_CARE_PROVIDER_SITE_OTHER): Payer: Medicare HMO | Admitting: Internal Medicine

## 2023-06-13 ENCOUNTER — Encounter: Payer: Self-pay | Admitting: Internal Medicine

## 2023-06-13 VITALS — BP 108/70 | HR 56 | Temp 98.2°F | Resp 16 | Ht 60.0 in | Wt 128.0 lb

## 2023-06-13 DIAGNOSIS — E785 Hyperlipidemia, unspecified: Secondary | ICD-10-CM | POA: Diagnosis not present

## 2023-06-13 DIAGNOSIS — Z0000 Encounter for general adult medical examination without abnormal findings: Secondary | ICD-10-CM

## 2023-06-13 DIAGNOSIS — I10 Essential (primary) hypertension: Secondary | ICD-10-CM

## 2023-06-13 DIAGNOSIS — E039 Hypothyroidism, unspecified: Secondary | ICD-10-CM | POA: Diagnosis not present

## 2023-06-13 LAB — COMPREHENSIVE METABOLIC PANEL WITH GFR
ALT: 18 U/L (ref 0–35)
AST: 18 U/L (ref 0–37)
Albumin: 4.8 g/dL (ref 3.5–5.2)
Alkaline Phosphatase: 57 U/L (ref 39–117)
BUN: 13 mg/dL (ref 6–23)
CO2: 28 meq/L (ref 19–32)
Calcium: 9.3 mg/dL (ref 8.4–10.5)
Chloride: 102 meq/L (ref 96–112)
Creatinine, Ser: 0.82 mg/dL (ref 0.40–1.20)
GFR: 68.82 mL/min (ref >60.00)
Glucose, Bld: 105 mg/dL — ABNORMAL HIGH (ref 70–99)
Potassium: 4.2 meq/L (ref 3.5–5.1)
Sodium: 138 meq/L (ref 135–145)
Total Bilirubin: 0.6 mg/dL (ref 0.2–1.2)
Total Protein: 7.1 g/dL (ref 6.0–8.3)

## 2023-06-13 LAB — LIPID PANEL
Cholesterol: 197 mg/dL (ref 0–200)
HDL: 62.8 mg/dL (ref >39.00)
LDL Cholesterol: 97 mg/dL (ref 0–99)
NonHDL: 134.08
Total CHOL/HDL Ratio: 3
Triglycerides: 184 mg/dL — ABNORMAL HIGH (ref 0.0–149.0)
VLDL: 36.8 mg/dL (ref 0.0–40.0)

## 2023-06-13 LAB — TSH: TSH: 0.83 u[IU]/mL (ref 0.35–5.50)

## 2023-06-13 NOTE — Progress Notes (Unsigned)
 Subjective:    Patient ID: [name], female    DOB: 1945/10/23, 78 y.o.   MRN: [id number]  DOS:  06/13/2023 Type of visit - description: CPX  Here for CPX. Chronic medical problems addressed. She feels well in general. Admits to some stress.  Wt Readings from Last 3 Encounters:  06/13/23 128 lb (58.1 kg)  02/05/23 126 lb (57.2 kg)  09/22/22 121 lb (54.9 kg)     Review of Systems See above   Past Medical History:  Diagnosis Date   Allergy    Angio-edema    Arthritis    Asthma    Cataract    Hyperlipidemia    Hypertension    Hypothyroidism    Liposarcoma (HCC) 1972   r leg   Osteoporosis    SCC (squamous cell carcinoma)    Urticaria     Past Surgical History:  Procedure Laterality Date   ABDOMINAL HYSTERECTOMY     no oophorectomy   BREAST LUMPECTOMY     x2 left breast   R leg liposarcoma  1973   SQUAMOUS CELL CARCINOMA EXCISION     RLE   TONSILLECTOMY      Current Outpatient Medications  Medication Instructions   amLODipine (NORVASC) 10 mg, Oral, Daily   atorvastatin (LIPITOR) 20 mg, Oral, Daily   calcium citrate-vitamin D (CITRACAL+D) 315-200 MG-UNIT per tablet 1 tablet   cholecalciferol (VITAMIN D) 1,000 Units, Daily   EPINEPHrine (EPI-PEN) 0.3 mg, Intramuscular, As needed   levothyroxine (SYNTHROID) 50 mcg, Oral, Daily before breakfast   metoprolol succinate (TOPROL-XL) 25 mg, Oral, Daily, Take with or immediately following a meal   Multiple Vitamin (MULTIVITAMIN) tablet 1 tablet, Daily   Prolia 60 mg, Subcutaneous, Every 6 months, Dx code: M81.0.  Pt has appt 04/17/23   VYZULTA 0.024 % SOLN 1 drop, Daily at bedtime       Objective:   Physical Exam BP 108/70   Pulse (!) 56   Temp 98.2 F (36.8 C) (Oral)   Resp 16   Ht 5' (1.524 m)   Wt 128 lb (58.1 kg)   SpO2 97%   BMI 25.00 kg/m  General: Well developed, NAD, BMI noted Neck: No  thyromegaly  HEENT:  Normocephalic . Face symmetric, atraumatic Lungs:  CTA B Normal respiratory  effort, no intercostal retractions, no accessory muscle use. Heart: RRR,  no murmur.  Abdomen:  Not distended, soft, non-tender. No rebound or rigidity.   Lower extremities: no pretibial edema bilaterally  Skin: Exposed areas without rash. Not pale. Not jaundice Neurologic:  alert & oriented X3.  Speech normal, gait appropriate for age and unassisted Strength symmetric and appropriate for age.  Psych: Cognition and judgment appear intact.  Cooperative with normal attention span and concentration.  Behavior appropriate. No anxious or depressed appearing.     Assessment   Assessment HTN Hyperlipidemia Hypothyroidism Osteoporosis  T score 2013: -2.6,  T score 02-2014  -2.5 after  Reclast x 3. Last reclast 05-2013. T score 02-2016: -2.4. T score -2.6 (04-2018),  Rx Prolia. Tscore 06/2021: - 2.3 (better ) --Normal Vit D 2014 SCC, skin cancer, right lower extremity, sees derm q 6 months Liposarcoma right leg 1972 (near ankle, posteriorly)   PLAN: Here for CPX exam -Td 2023 -  Pneumonia shot 2012, Prevnar 02-2015; PNM 20: 2023 - Zostavax 2008; s/p shingrix x 2; s/p RSV  - Vaccines are recommended: Flu shot every fall, COVID booster if not done in the last 6 months.  -  CCS: 2011 colonoscopy normal Dr. Randa Evens; colonoscopy 05/2020:   + Polyps, hyperplastic, no further colonoscopy -Female care: does not see gyn regulalrly, SBE (-),  MMG November 01-2023 ([facility]) -Labs:  CMP FLP TSH -Lifestyle: She is somewhat concerned about her weight, BMI 25, nevertheless recommend to continue staying active, she goes to the [facility], watch her diet. -ACP documents on file Other issues: HTN: Ambulatory BPs very good, BP today 108/70, slightly low, no symptoms.  Continue amlodipine, metoprolol, check CMP. High cholesterol: On atorvastatin, check FLP. Hypothyroidism: On Synthroid, check TSH. Osteoporosis: Next Prolia shot around 10-2023, she remains active, going to the [facility], on vitamin D and calcium  supplements. RTC 6 months      HTN: BP today is very good, continue amlodipine, metoprolol, check a BMP and CBC Hypothyroidism: On levothyroxine, check TSH. High cholesterol: Controlled on atorvastatin. Skin cancer: Closely follow-up by dermatology. Preventive care: Had recent COVID and flu shots. RTC 3 months CPX

## 2023-06-13 NOTE — Patient Instructions (Signed)
 Vaccines I recommend: COVID booster if not done since September 2024. Flu shot every fall   Check the  blood pressure regularly Blood pressure goal:  between 110/65 and  135/85. If it is consistently higher or lower, let me know     GO TO THE LAB : Get the blood work     Please go to the front desk: Arrange for a follow-up in 6 months

## 2023-06-14 ENCOUNTER — Encounter: Payer: Self-pay | Admitting: Internal Medicine

## 2023-06-14 NOTE — Assessment & Plan Note (Signed)
 Here for CPX   -Td 2023 -  Pneumonia shot 2012, Prevnar 02-2015; PNM 20: 2023 - Zostavax 2008; s/p shingrix x 2; s/p RSV  - Vaccines are recommended: Flu shot every fall, COVID booster if not done in the last 6 months. -CCS: see previous comments, no further colonoscopy -Female care: does not see gyn regulalrly, SBE (-),  MMG November 01-2023 ([facility]) -Labs:  CMP FLP TSH -Lifestyle: She is somewhat concerned about her weight, BMI is healthy at  25, nevertheless recommend to continue staying active, she goes to the [facility], watch her diet. -ACP documents on file

## 2023-06-14 NOTE — Assessment & Plan Note (Signed)
 Here for CPX   Other issues: HTN: Ambulatory BPs very good, BP today 108/70, slightly low, no symptoms.  Continue amlodipine, metoprolol, check CMP. High cholesterol: On atorvastatin, check FLP. Hypothyroidism: On Synthroid, check TSH. Osteoporosis: Next Prolia shot around 10-2023, she remains active, going to the [facility], on vitamin D and calcium supplements. RTC 6 months

## 2023-06-15 ENCOUNTER — Encounter: Payer: Self-pay | Admitting: Internal Medicine

## 2023-06-27 DIAGNOSIS — H02831 Dermatochalasis of right upper eyelid: Secondary | ICD-10-CM | POA: Diagnosis not present

## 2023-06-27 DIAGNOSIS — H2513 Age-related nuclear cataract, bilateral: Secondary | ICD-10-CM | POA: Diagnosis not present

## 2023-06-27 DIAGNOSIS — H40023 Open angle with borderline findings, high risk, bilateral: Secondary | ICD-10-CM | POA: Diagnosis not present

## 2023-06-27 DIAGNOSIS — H02834 Dermatochalasis of left upper eyelid: Secondary | ICD-10-CM | POA: Diagnosis not present

## 2023-07-03 ENCOUNTER — Ambulatory Visit (INDEPENDENT_AMBULATORY_CARE_PROVIDER_SITE_OTHER): Payer: Medicare HMO

## 2023-07-03 VITALS — Ht 60.0 in | Wt 128.0 lb

## 2023-07-03 DIAGNOSIS — Z0000 Encounter for general adult medical examination without abnormal findings: Secondary | ICD-10-CM | POA: Diagnosis not present

## 2023-07-03 NOTE — Progress Notes (Signed)
 Subjective:   [name] is a 78 y.o. who presents for a Medicare Wellness preventive visit.  Visit Complete: Virtual I connected with  [name] on 07/03/23 by a audio enabled telemedicine application and verified that I am speaking with the correct person using two identifiers.  Patient Location: Home  Provider Location: Home Office  I discussed the limitations of evaluation and management by telemedicine. The patient expressed understanding and agreed to proceed.  Vital Signs: Because this visit was a virtual/telehealth visit, some criteria may be missing or patient reported. Any vitals not documented were not able to be obtained and vitals that have been documented are patient reported.     Persons Participating in Visit: Patient.  AWV Questionnaire: No: Patient Medicare AWV questionnaire was not completed prior to this visit.  Cardiac Risk Factors include: advanced age (>65men, >8 women);hypertension     Objective:    Today's Vitals   07/03/23 0820  Weight: 128 lb (58.1 kg)  Height: 5' (1.524 m)   Body mass index is 25 kg/m.     07/03/2023    8:27 AM 06/16/2022    9:06 AM 06/09/2021    3:45 PM 06/03/2020    2:05 PM 12/10/2019    3:09 PM 04/14/2019    9:08 AM 04/08/2018   10:16 AM  Advanced Directives  Does Patient Have a Medical Advance Directive? Yes Yes Yes Yes Yes Yes Yes  Type of Estate agent of [place];Living will Healthcare Power of [place];Living will Healthcare Power of [place];Out of facility DNR (pink MOST or yellow form);Living will Healthcare Power of [place];Living will Healthcare Power of [place];Living will Healthcare Power of [place];Living will Healthcare Power of [place];Living will  Does patient want to make changes to medical advance directive? No - Patient declined No - Patient declined    No - Patient declined No - Patient declined  Copy of Healthcare Power of Attorney in Chart? Yes - validated most recent copy  scanned in chart (See row information) Yes - validated most recent copy scanned in chart (See row information) Yes - validated most recent copy scanned in chart (See row information) No - copy requested  No - copy requested No - copy requested    Current Medications (verified) Outpatient Encounter Medications as of 07/03/2023  Medication Sig   amLODipine  (NORVASC ) 10 MG tablet Take 1 tablet (10 mg total) by mouth daily.   atorvastatin  (LIPITOR) 20 MG tablet Take 1 tablet (20 mg total) by mouth daily.   calcium  citrate-vitamin D  (CITRACAL+D) 315-200 MG-UNIT per tablet Take 1 tablet by mouth. 600 mg bid   cholecalciferol (VITAMIN D ) 1000 units tablet Take 1,000 Units by mouth daily.   denosumab  (PROLIA ) 60 MG/ML SOSY injection Inject 60 mg into the skin every 6 (six) months. Dx code: M81.0.  Pt has appt 04/17/23   EPINEPHrine  0.3 mg/0.3 mL IJ SOAJ injection Inject 0.3 mg into the muscle as needed for anaphylaxis. (Patient not taking: Reported on 06/13/2023)   levothyroxine  (SYNTHROID ) 50 MCG tablet Take 1 tablet (50 mcg total) by mouth daily before breakfast.   metoprolol  succinate (TOPROL -XL) 25 MG 24 hr tablet Take 1 tablet (25 mg total) by mouth daily. Take with or immediately following a meal   Multiple Vitamin (MULTIVITAMIN) tablet Take 1 tablet by mouth daily.   VYZULTA 0.024 % SOLN Place 1 drop into both eyes at bedtime.   Facility-Administered Encounter Medications as of 07/03/2023  Medication   [START ON 10/08/2023] denosumab  (PROLIA )  injection 60 mg    Allergies (verified) Bee venom, Promethazine  hcl, Thimerosal (thiomersal), and Moxifloxacin   History: Past Medical History:  Diagnosis Date   Allergy    Angio-edema    Arthritis    Asthma    Cataract    Hyperlipidemia    Hypertension    Hypothyroidism    Liposarcoma (HCC) 1972   r leg   Osteoporosis    SCC (squamous cell carcinoma)    Urticaria    Past Surgical History:  Procedure Laterality Date   ABDOMINAL HYSTERECTOMY      no oophorectomy   BREAST LUMPECTOMY     x2 left breast   R leg liposarcoma  1973   SQUAMOUS CELL CARCINOMA EXCISION     RLE   TONSILLECTOMY     Family History  Problem Relation Age of Onset   Heart disease Mother        congenital heart defect   Cancer Father        kidney cancer   Cancer Brother        Brain Cancer   Cancer Maternal Aunt        breast cancer   Cancer Paternal Aunt        breast cancer   Colon cancer Neg Hx    Diabetes Neg Hx    Allergic rhinitis Neg Hx    Asthma Neg Hx    Eczema Neg Hx    Urticaria Neg Hx    Social History   Socioeconomic History   Marital status: Married    Spouse name: Not on file   Number of children: 2   Years of education: Not on file   Highest education level: Some college, no degree  Occupational History   Occupation: retired -- Sales executive, class Research scientist (physical sciences)   Tobacco Use   Smoking status: Never   Smokeless tobacco: Never  Vaping Use   Vaping status: Never Used  Substance and Sexual Activity   Alcohol use: No   Drug use: No   Sexual activity: Not Currently    Birth control/protection: Post-menopausal  Other Topics Concern   Not on file  Social History Narrative   Lost a son when he was 24 y/o   Lives w/ husband      *02/09/2022 5th squamous cell removal two weeks ago left leg near ankle - in one year.   Social Drivers of Corporate investment banker Strain: Low Risk  (07/03/2023)   Overall Financial Resource Strain (CARDIA)    Difficulty of Paying Living Expenses: Not hard at all  Food Insecurity: No Food Insecurity (07/03/2023)   Hunger Vital Sign    Worried About Running Out of Food in the Last Year: Never true    Ran Out of Food in the Last Year: Never true  Transportation Needs: No Transportation Needs (07/03/2023)   PRAPARE - Administrator, Civil Service (Medical): No    Lack of Transportation (Non-Medical): No  Physical Activity: Sufficiently Active (07/03/2023)   Exercise Vital Sign     Days of Exercise per Week: 5 days    Minutes of Exercise per Session: 60 min  Stress: No Stress Concern Present (07/03/2023)   [employer] of Occupational Health - Occupational Stress Questionnaire    Feeling of Stress : Not at all  Social Connections: Socially Integrated (07/03/2023)   Social Connection and Isolation Panel [NHANES]    Frequency of Communication with Friends and Family: More than three times a week  Frequency of Social Gatherings with Friends and Family: More than three times a week    Attends Religious Services: More than 4 times per year    Active Member of Clubs or Organizations: Yes    Attends Engineer, structural: More than 4 times per year    Marital Status: Married    Tobacco Counseling Counseling given: Not Answered    Clinical Intake:  Pre-visit preparation completed: Yes  Pain : No/denies pain     BMI - recorded: 25 Nutritional Status: BMI 25 -29 Overweight Nutritional Risks: None Diabetes: No  No results found for: "HGBA1C"   How often do you need to have someone help you when you read instructions, pamphlets, or other written materials from your doctor or pharmacy?: 1 - Never  Interpreter Needed?: No  Information entered by :: Farris Hong LPN   Activities of Daily Living     07/03/2023    8:25 AM  In your present state of health, do you have any difficulty performing the following activities:  Hearing? 0  Vision? 0  Difficulty concentrating or making decisions? 0  Walking or climbing stairs? 0  Dressing or bathing? 0  Doing errands, shopping? 0  Preparing Food and eating ? N  Using the Toilet? N  In the past six months, have you accidently leaked urine? N  Do you have problems with loss of bowel control? N  Managing your Medications? N  Managing your Finances? N  Housekeeping or managing your Housekeeping? N    Patient Care Team: Ezell Hollow, MD as PCP - General (Internal Medicine) Thais Fill, MD as  Consulting Physician (Dermatology) Altamease Asters, DO as Consulting Physician (Optometry) Elly Habermann, MD as Consulting Physician (Orthopedic Surgery) Beshears, Alfonso Angles, DMD as Consulting Physician (Dentistry) Avanell Leigh, MD as Consulting Physician (Cardiology)  Indicate any recent Medical Services you may have received from other than [facility] providers in the past year (date may be approximate).     Assessment:   This is a routine wellness examination for Izibella.  Hearing/Vision screen Hearing Screening - Comments:: Denies hearing difficulties   Vision Screening - Comments:: Wears rx glasses - up to date with routine eye exams with  [facility]   Goals Addressed               This Visit's Progress     Remain Active (pt-stated)         Depression Screen     07/03/2023    8:24 AM 06/13/2023   10:47 AM 02/05/2023    2:01 PM 08/01/2022    4:04 PM 06/16/2022    9:07 AM 03/24/2022   10:24 AM 09/21/2021    9:01 AM  PHQ 2/9 Scores  PHQ - 2 Score 0 0 0 0 0 2 0  PHQ- 9 Score      4     Fall Risk     07/03/2023    8:26 AM 06/13/2023   10:47 AM 02/05/2023    2:01 PM 08/01/2022    4:04 PM 06/14/2022    5:18 PM  Fall Risk   Falls in the past year? 0 0 0 0 0  Number falls in past yr: 0 0 0 0 0  Injury with Fall? 0 0 0 0 0  Risk for fall due to : No Fall Risks    No Fall Risks  Follow up Falls prevention discussed;Falls evaluation completed Falls evaluation completed;Education provided Falls evaluation completed Falls evaluation  completed Falls evaluation completed    MEDICARE RISK AT HOME:  Medicare Risk at Home Any stairs in or around the home?: No If so, are there any without handrails?: No Home free of loose throw rugs in walkways, pet beds, electrical cords, etc?: Yes Adequate lighting in your home to reduce risk of falls?: Yes Life alert?: No Use of a cane, walker or w/c?: No Grab bars in the bathroom?: Yes Shower chair or bench in shower?: Yes Elevated toilet  seat or a handicapped toilet?: Yes  TIMED UP AND GO:  Was the test performed?  No  Cognitive Function: 6CIT completed    03/31/2015    9:38 AM  MMSE - Mini Mental State Exam  Orientation to time 5  Orientation to Place 5  Registration 3  Attention/ Calculation 5  Recall 3  Language- name 2 objects 2  Language- repeat 1  Language- follow 3 step command 3  Language- read & follow direction 1  Write a sentence 1  Copy design 0  Total score 29        07/03/2023    8:27 AM 06/16/2022    9:13 AM 06/09/2021    3:50 PM  6CIT Screen  What Year? 0 points 0 points 0 points  What month? 0 points 0 points 0 points  What time? 0 points 0 points 0 points  Count back from 20 0 points 0 points 0 points  Months in reverse 0 points 0 points 0 points  Repeat phrase 0 points 0 points 2 points  Total Score 0 points 0 points 2 points    Immunizations Immunization History  Administered Date(s) Administered   Influenza Split 01/03/2012, 12/03/2017, 12/05/2019   Influenza Whole 01/07/2007, 11/27/2007, 03/26/2008, 11/12/2008, 11/04/2009   Influenza, High Dose Seasonal PF 11/29/2018, 12/02/2020, 12/16/2021   Influenza,inj,Quad PF,6+ Mos 01/28/2014   Influenza-Unspecified 12/23/2014, 11/27/2015, 12/09/2016, 12/05/2022   Meningococcal Conjugate 06/21/2022   PFIZER(Purple Top)SARS-COV-2 Vaccination 04/12/2019, 05/07/2019, 12/03/2019, 07/24/2020   PNEUMOCOCCAL CONJUGATE-20 03/23/2021, 06/21/2022   Pfizer Covid-19 Vaccine Bivalent Booster 2yrs & up 12/17/2020, 11/29/2021   Pfizer(Comirnaty)Fall Seasonal Vaccine 12 years and older 01/13/2023   Pneumococcal Conjugate-13 02/09/2015   Pneumococcal Polysaccharide-23 01/02/2011   Respiratory Syncytial Virus Vaccine,Recomb Aduvanted(Arexvy) 01/06/2022   Tdap 01/02/2011, 04/11/2021   Zoster Recombinant(Shingrix) 04/18/2017, 07/07/2017   Zoster, Live 02/07/2007    Screening Tests Health Maintenance  Topic Date Due   COVID-19 Vaccine (8 - Pfizer  risk 2024-25 season) 07/13/2023   INFLUENZA VACCINE  10/05/2023   Medicare Annual Wellness (AWV)  07/02/2024   DTaP/Tdap/Td (3 - Td or Tdap) 04/12/2031   Pneumonia Vaccine 41+ Years old  Completed   DEXA SCAN  Completed   Hepatitis C Screening  Completed   Zoster Vaccines- Shingrix  Completed   HPV VACCINES  Aged Out   Meningococcal B Vaccine  Aged Out   Colonoscopy  Discontinued    Health Maintenance  There are no preventive care reminders to display for this patient.  Health Maintenance Items Addressed:    Additional Screening:  Vision Screening: Recommended annual ophthalmology exams for early detection of glaucoma and other disorders of the eye.  Dental Screening: Recommended annual dental exams for proper oral hygiene  Community Resource Referral / Chronic Care Management: CRR required this visit?  No   CCM required this visit?  No     Plan:     I have personally reviewed and noted the following in the patient's chart:   Medical and social history Use  of alcohol, tobacco or illicit drugs  Current medications and supplements including opioid prescriptions. Patient is not currently taking opioid prescriptions. Functional ability and status Nutritional status Physical activity Advanced directives List of other physicians Hospitalizations, surgeries, and ER visits in previous 12 months Vitals Screenings to include cognitive, depression, and falls Referrals and appointments  In addition, I have reviewed and discussed with patient certain preventive protocols, quality metrics, and best practice recommendations. A written personalized care plan for preventive services as well as general preventive health recommendations were provided to patient.     Dewayne Ford, LPN   [id number]   After Visit Summary: (MyChart) Due to this being a telephonic visit, the after visit summary with patients personalized plan was offered to patient via MyChart   Notes: Nothing  significant to report at this time.

## 2023-07-03 NOTE — Patient Instructions (Addendum)
 Ms. [name] , Thank you for taking time to come for your Medicare Wellness Visit. I appreciate your ongoing commitment to your health goals. Please review the following plan we discussed and let me know if I can assist you in the future.   Referrals/Orders/Follow-Ups/Clinician Recommendations:   This is a list of the screening recommended for you and due dates:  Health Maintenance  Topic Date Due   COVID-19 Vaccine (8 - Pfizer risk 2024-25 season) 07/13/2023   Flu Shot  10/05/2023   Medicare Annual Wellness Visit  07/02/2024   DTaP/Tdap/Td vaccine (3 - Td or Tdap) 04/12/2031   Pneumonia Vaccine  Completed   DEXA scan (bone density measurement)  Completed   Hepatitis C Screening  Completed   Zoster (Shingles) Vaccine  Completed   HPV Vaccine  Aged Out   Meningitis B Vaccine  Aged Out   Colon Cancer Screening  Discontinued    Advanced directives: (In Chart) A copy of your advanced directives are scanned into your chart should your provider ever need it.  Next Medicare Annual Wellness Visit scheduled for next year: Yes

## 2023-07-11 ENCOUNTER — Ambulatory Visit (INDEPENDENT_AMBULATORY_CARE_PROVIDER_SITE_OTHER)

## 2023-07-11 DIAGNOSIS — T63441D Toxic effect of venom of bees, accidental (unintentional), subsequent encounter: Secondary | ICD-10-CM

## 2023-08-22 ENCOUNTER — Ambulatory Visit (INDEPENDENT_AMBULATORY_CARE_PROVIDER_SITE_OTHER)

## 2023-08-22 DIAGNOSIS — T63441D Toxic effect of venom of bees, accidental (unintentional), subsequent encounter: Secondary | ICD-10-CM | POA: Diagnosis not present

## 2023-09-04 ENCOUNTER — Other Ambulatory Visit: Payer: Self-pay | Admitting: Internal Medicine

## 2023-09-10 ENCOUNTER — Other Ambulatory Visit (HOSPITAL_COMMUNITY): Payer: Self-pay

## 2023-09-10 ENCOUNTER — Telehealth: Payer: Self-pay

## 2023-09-10 NOTE — Telephone Encounter (Signed)
 Prolia  VOB initiated via [email or web address]  Next Prolia  inj DUE: 10/15/23

## 2023-09-10 NOTE — Telephone Encounter (Signed)
 PA submitted via [employer]. Authorization Number : [id number]

## 2023-09-10 NOTE — Telephone Encounter (Signed)
 [name]

## 2023-09-10 NOTE — Telephone Encounter (Signed)
 Pt ready for scheduling for PROLIA  on or after : 10/15/23  Option# 1: Buy/Bill (Office supplied medication)  Out-of-pocket cost due at time of clinic visit: $357  Number of injection/visits approved: 2  Primary: [employer] Prolia  co-insurance: 20% Admin fee co-insurance: 20%  Secondary: --- Prolia  co-insurance:  Admin fee co-insurance:   Medical Benefit Details: Date Benefits were checked: 09/10/23 Deductible: NO/ Coinsurance: 20%/ Admin Fee: 20%  Prior Auth: APPROVED PA# [id number]  Expiration Date: 09/10/23-09/09/24  # of doses approved: 2 ----------------------------------------------------------------------- Option# 2- Med Obtained from pharmacy:  Pharmacy benefit: Copay $--- NEXT FILL 10/08/23 (Paid to pharmacy) Admin Fee: 20% (Pay at clinic)  Prior Auth: N/A PA# Expiration Date:   # of doses approved:   If patient wants fill through the pharmacy benefit please send prescription to: [employer], and include estimated need by date in rx notes. Pharmacy will ship medication directly to the office.  Patient NOT eligible for Prolia  Copay Card. Copay Card can make patient's cost as little as $25. Link to apply: [email or web address]://[email or web address]  ** This summary of benefits is an estimation of the patient's out-of-pocket cost. Exact cost may very based on individual plan coverage.

## 2023-09-14 ENCOUNTER — Other Ambulatory Visit: Payer: Self-pay

## 2023-09-14 ENCOUNTER — Other Ambulatory Visit: Payer: Self-pay | Admitting: *Deleted

## 2023-09-14 ENCOUNTER — Other Ambulatory Visit (HOSPITAL_COMMUNITY): Payer: Self-pay

## 2023-09-14 DIAGNOSIS — M810 Age-related osteoporosis without current pathological fracture: Secondary | ICD-10-CM

## 2023-09-14 MED ORDER — DENOSUMAB 60 MG/ML ~~LOC~~ SOSY
60.0000 mg | PREFILLED_SYRINGE | SUBCUTANEOUS | 0 refills | Status: AC
  Filled 2023-09-14 – 2023-09-27 (×2): qty 1, 180d supply, fill #0

## 2023-09-27 ENCOUNTER — Other Ambulatory Visit (HOSPITAL_COMMUNITY): Payer: Self-pay

## 2023-09-27 ENCOUNTER — Other Ambulatory Visit: Payer: Self-pay

## 2023-09-27 ENCOUNTER — Other Ambulatory Visit: Payer: Self-pay | Admitting: Pharmacy Technician

## 2023-09-27 NOTE — Progress Notes (Signed)
 Specialty Pharmacy Refill Coordination Note  [name] is a 78 y.o. female contacted today regarding refills of specialty medication(s) Denosumab  (PROLIA )   Patient requested Courier to Provider Office   Delivery date: 10/10/23   Verified address: [facility] [address] [place]  [address]   Medication will be filled on 10/09/23.   Patient called in early to request refill in advance before appt on 10/16/23.

## 2023-09-28 ENCOUNTER — Other Ambulatory Visit: Payer: Self-pay

## 2023-10-02 ENCOUNTER — Other Ambulatory Visit: Payer: Self-pay | Admitting: Internal Medicine

## 2023-10-03 ENCOUNTER — Ambulatory Visit (INDEPENDENT_AMBULATORY_CARE_PROVIDER_SITE_OTHER)

## 2023-10-03 ENCOUNTER — Other Ambulatory Visit: Payer: Self-pay

## 2023-10-03 DIAGNOSIS — T63441D Toxic effect of venom of bees, accidental (unintentional), subsequent encounter: Secondary | ICD-10-CM | POA: Diagnosis not present

## 2023-10-09 ENCOUNTER — Other Ambulatory Visit: Payer: Self-pay

## 2023-10-16 ENCOUNTER — Ambulatory Visit (INDEPENDENT_AMBULATORY_CARE_PROVIDER_SITE_OTHER): Admitting: *Deleted

## 2023-10-16 DIAGNOSIS — M810 Age-related osteoporosis without current pathological fracture: Secondary | ICD-10-CM | POA: Diagnosis not present

## 2023-10-16 MED ORDER — DENOSUMAB 60 MG/ML ~~LOC~~ SOSY: 60.0000 mg | PREFILLED_SYRINGE | SUBCUTANEOUS | Status: AC

## 2023-10-16 NOTE — Progress Notes (Signed)
 Patient here for Prolia injection per physicians orders  Prolia 60 mg SQ , was administered left arm today. Patient tolerated injection.  Patient next injection due: 6 months, appt made:   No- will schedule in 5 months after benefits are ran again  Initial injection: no  Did Prolia come from pharmacy (if yes please select patient supplied): yes  Cam placed for next injection yes

## 2023-10-22 DIAGNOSIS — H40023 Open angle with borderline findings, high risk, bilateral: Secondary | ICD-10-CM | POA: Diagnosis not present

## 2023-10-22 DIAGNOSIS — H02834 Dermatochalasis of left upper eyelid: Secondary | ICD-10-CM | POA: Diagnosis not present

## 2023-10-22 DIAGNOSIS — H2513 Age-related nuclear cataract, bilateral: Secondary | ICD-10-CM | POA: Diagnosis not present

## 2023-10-22 DIAGNOSIS — H02831 Dermatochalasis of right upper eyelid: Secondary | ICD-10-CM | POA: Diagnosis not present

## 2023-10-26 DIAGNOSIS — D2271 Melanocytic nevi of right lower limb, including hip: Secondary | ICD-10-CM | POA: Diagnosis not present

## 2023-10-26 DIAGNOSIS — D225 Melanocytic nevi of trunk: Secondary | ICD-10-CM | POA: Diagnosis not present

## 2023-10-26 DIAGNOSIS — C44712 Basal cell carcinoma of skin of right lower limb, including hip: Secondary | ICD-10-CM | POA: Diagnosis not present

## 2023-10-26 DIAGNOSIS — C44729 Squamous cell carcinoma of skin of left lower limb, including hip: Secondary | ICD-10-CM | POA: Diagnosis not present

## 2023-10-26 DIAGNOSIS — Z808 Family history of malignant neoplasm of other organs or systems: Secondary | ICD-10-CM | POA: Diagnosis not present

## 2023-10-26 DIAGNOSIS — D2272 Melanocytic nevi of left lower limb, including hip: Secondary | ICD-10-CM | POA: Diagnosis not present

## 2023-10-26 DIAGNOSIS — C4491 Basal cell carcinoma of skin, unspecified: Secondary | ICD-10-CM | POA: Insufficient documentation

## 2023-10-26 DIAGNOSIS — D485 Neoplasm of uncertain behavior of skin: Secondary | ICD-10-CM | POA: Diagnosis not present

## 2023-10-26 DIAGNOSIS — D2262 Melanocytic nevi of left upper limb, including shoulder: Secondary | ICD-10-CM | POA: Diagnosis not present

## 2023-10-26 DIAGNOSIS — L570 Actinic keratosis: Secondary | ICD-10-CM | POA: Diagnosis not present

## 2023-10-26 DIAGNOSIS — L578 Other skin changes due to chronic exposure to nonionizing radiation: Secondary | ICD-10-CM | POA: Diagnosis not present

## 2023-10-26 DIAGNOSIS — D2261 Melanocytic nevi of right upper limb, including shoulder: Secondary | ICD-10-CM | POA: Diagnosis not present

## 2023-10-31 ENCOUNTER — Ambulatory Visit

## 2023-11-02 ENCOUNTER — Other Ambulatory Visit: Payer: Self-pay | Admitting: Internal Medicine

## 2023-11-14 ENCOUNTER — Ambulatory Visit (INDEPENDENT_AMBULATORY_CARE_PROVIDER_SITE_OTHER)

## 2023-11-14 DIAGNOSIS — T63441D Toxic effect of venom of bees, accidental (unintentional), subsequent encounter: Secondary | ICD-10-CM

## 2023-11-25 ENCOUNTER — Other Ambulatory Visit: Payer: Self-pay | Admitting: Internal Medicine

## 2023-11-29 DIAGNOSIS — C44729 Squamous cell carcinoma of skin of left lower limb, including hip: Secondary | ICD-10-CM | POA: Diagnosis not present

## 2023-12-12 ENCOUNTER — Encounter: Payer: Self-pay | Admitting: Internal Medicine

## 2023-12-12 ENCOUNTER — Ambulatory Visit: Admitting: Internal Medicine

## 2023-12-12 VITALS — BP 126/70 | HR 63 | Temp 97.8°F | Resp 16 | Ht 60.0 in | Wt 129.4 lb

## 2023-12-12 DIAGNOSIS — E039 Hypothyroidism, unspecified: Secondary | ICD-10-CM

## 2023-12-12 DIAGNOSIS — M810 Age-related osteoporosis without current pathological fracture: Secondary | ICD-10-CM

## 2023-12-12 DIAGNOSIS — I10 Essential (primary) hypertension: Secondary | ICD-10-CM

## 2023-12-12 LAB — CBC WITH DIFFERENTIAL/PLATELET
Basophils Absolute: 0.1 K/uL (ref 0.0–0.1)
Basophils Relative: 0.7 % (ref 0.0–3.0)
Eosinophils Absolute: 0.1 K/uL (ref 0.0–0.7)
Eosinophils Relative: 1.3 % (ref 0.0–5.0)
HCT: 42.6 % (ref 36.0–46.0)
Hemoglobin: 13.9 g/dL (ref 12.0–15.0)
Lymphocytes Relative: 10.6 % — ABNORMAL LOW (ref 12.0–46.0)
Lymphs Abs: 1 K/uL (ref 0.7–4.0)
MCHC: 32.6 g/dL (ref 30.0–36.0)
MCV: 91.5 fl (ref 78.0–100.0)
Monocytes Absolute: 0.6 K/uL (ref 0.1–1.0)
Monocytes Relative: 6.3 % (ref 3.0–12.0)
Neutro Abs: 7.9 K/uL — ABNORMAL HIGH (ref 1.4–7.7)
Neutrophils Relative %: 81.1 % — ABNORMAL HIGH (ref 43.0–77.0)
Platelets: 316 K/uL (ref 150.0–400.0)
RBC: 4.65 Mil/uL (ref 3.87–5.11)
RDW: 13.2 % (ref 11.5–15.5)
WBC: 9.8 K/uL (ref 4.0–10.5)

## 2023-12-12 LAB — TSH: TSH: 0.76 u[IU]/mL (ref 0.35–5.50)

## 2023-12-12 NOTE — Progress Notes (Signed)
 "  Subjective:    Patient ID: [name], female    DOB: 11/16/1945, 78 y.o.   MRN: [id number]  DOS:  12/12/2023 Discussed the use of AI scribe software for clinical note transcription with the patient, who gave verbal consent to proceed. History of Present Illness Follow-up  Cutaneous malignancies and dermatologic care - Recent excision of squamous cell carcinoma from the left ankle - Scheduled for basal cell carcinoma removal from a skin graft on the right leg - Attends dermatology appointments every three months for ongoing skin condition management  Lower extremity edema and post-surgical discomfort - Swelling and discomfort in the leg following recent skin cancer surgery - Reduced physical activity due to post-surgical symptoms - No recent falls  Hypertension and cardiovascular risk management - Home blood pressure readings average 130/70 mmHg - Takes metoprolol  and amlodipine  10 mg for blood pressure control - Takes Lipitor 20 mg for hyperlipidemia  Osteoporosis management - Receives Prolia  injections for osteoporosis - Takes 1000 units of vitamin D  daily  Hypothyroidism - Takes Synthroid  50 mcg daily for hypothyroidism  Immunizations - Received influenza vaccine and COVID booster     Review of Systems See above   Past Medical History:  Diagnosis Date   Allergy    Angio-edema    Arthritis    Asthma    Cataract    Hyperlipidemia    Hypertension    Hypothyroidism    Liposarcoma (HCC) 1972   r leg   Osteoporosis    SCC (squamous cell carcinoma)    Urticaria     Past Surgical History:  Procedure Laterality Date   ABDOMINAL HYSTERECTOMY     no oophorectomy   BREAST LUMPECTOMY     x2 left breast   R leg liposarcoma  1973   SQUAMOUS CELL CARCINOMA EXCISION     RLE   TONSILLECTOMY      Current Outpatient Medications  Medication Instructions   amLODipine  (NORVASC ) 10 mg, Oral, Daily   atorvastatin  (LIPITOR) 20 mg, Oral, Daily   calcium   citrate-vitamin D  (CITRACAL+D) 315-200 MG-UNIT per tablet 1 tablet   cholecalciferol (VITAMIN D ) 1,000 Units, Daily   EPINEPHrine  (EPI-PEN) 0.3 mg, Intramuscular, As needed   levothyroxine  (SYNTHROID ) 50 mcg, Oral, Daily before breakfast   metoprolol  succinate (TOPROL -XL) 25 mg, Oral, Daily, Take with or immediately following a meal   Multiple Vitamin (MULTIVITAMIN) tablet 1 tablet, Daily   Prolia  60 mg, Subcutaneous, Every 6 months, Dx code: M81.0.  Pt has appt 10/16/23   VYZULTA 0.024 % SOLN 1 drop, Daily at bedtime       Objective:   Physical Exam BP 126/70   Pulse 63   Temp 97.8 F (36.6 C) (Oral)   Resp 16   Ht 5' (1.524 m)   Wt 129 lb 6 oz (58.7 kg)   SpO2 97%   BMI 25.27 kg/m  General:   Well developed, NAD, BMI noted. HEENT:  Normocephalic . Face symmetric, atraumatic Lungs:  CTA B Normal respiratory effort, no intercostal retractions, no accessory muscle use. Heart: RRR,  no murmur.  Lower extremities: no pretibial edema bilaterally  Skin: Not pale. Not jaundice Neurologic:  alert & oriented X3.  Speech normal, gait appropriate for age and unassisted Psych--  Cognition and judgment appear intact.  Cooperative with normal attention span and concentration.  Behavior appropriate. No anxious or depressed appearing.      Assessment    Assessment HTN Hyperlipidemia Hypothyroidism Osteoporosis  T score 2013: -2.6,  T  score 02-2014  -2.5 after  Reclast  x 3. Last reclast  05-2013. T score 02-2016: -2.4. T score -2.6 (04-2018),  Rx Prolia . Tscore 06/2021: - 2.3 (better ) --Normal Vit D 2014 SCC, skin cancer, right lower extremity, sees derm q 6 months Liposarcoma right leg 1972 (near ankle, posteriorly)  Assessment & Plan Skin cancer: SCC: Left ankle, post-excision BCC: To have excision soon recently excised. Has regular dermatology follow-ups every three months. HTN: Blood pressure well controlled . Continue amlodipine , metoprolol .  Check CBC High  cholesterol: Cholesterol levels controlled with atorvastatin  20 mg daily Hypothyroidism Managed with levothyroxine . Check TSH   Osteoporosis Managed with Prolia  and vitamin D . No recent falls.  Continue the same. Caregiver stress Experiencing stress from caregiving responsibilities. Managing well, continue participation in a support group for emotional support. Preventive care: Had a flu and a COVID-vaccine. RTC April 2026 CPX      "

## 2023-12-12 NOTE — Patient Instructions (Signed)
 GO TO THE LAB :  Get the blood work    Then, go to the front desk for the checkout Please make an appointment for a physical exam by April 2026   Continue checking your blood pressure regularly Blood pressure goal:  between 110/65 and  135/85. If it is consistently higher or lower, let me know     Please read more detailed instructions below       SQUAMOUS CELL CARCINOMA OF SKIN, LEFT ANKLE, POST-EXCISION: You recently had a squamous cell carcinoma removed from your left ankle. -Continue with regular dermatology follow-ups every three months.  BASAL CELL CARCINOMA OF SKIN GRAFT, RIGHT LEG, PLANNED EXCISION: You are scheduled to have a basal cell carcinoma removed from a skin graft on your right leg. -Proceed with the scheduled excision    HYPERTENSION: Your blood pressure is well controlled  -Continue taking your current antihypertensive medications, metoprolol  and amlodipine .  HYPERLIPIDEMIA: Your cholesterol levels are well controlled with atorvastatin . -Continue taking atorvastatin  20 mg daily.  HYPOTHYROIDISM: Your hypothyroidism is managed with levothyroxine . -Continue taking Synthroid  50 mcg daily. -We will check your TSH levels to ensure your condition is well controlled.  OSTEOPOROSIS: Your osteoporosis is managed with Prolia  injections and vitamin D . -Continue with Prolia  injections and taking 1000 units of vitamin D  daily.  CAREGIVER STRESS: You are experiencing stress from your caregiving responsibilities. -Continue participating in a support group for emotional support.

## 2023-12-13 NOTE — Assessment & Plan Note (Signed)
 Assessment & Plan Skin cancer: SCC: Left ankle, post-excision BCC: To have excision soon recently excised. Has regular dermatology follow-ups every three months. HTN: Blood pressure well controlled . Continue amlodipine , metoprolol .  Check CBC High cholesterol: Cholesterol levels controlled with atorvastatin  20 mg daily Hypothyroidism Managed with levothyroxine . Check TSH   Osteoporosis Managed with Prolia  and vitamin D . No recent falls.  Continue the same. Caregiver stress Experiencing stress from caregiving responsibilities. Managing well, continue participation in a support group for emotional support. Preventive care: Had a flu and a COVID-vaccine. RTC April 2026 CPX

## 2023-12-15 ENCOUNTER — Ambulatory Visit: Payer: Self-pay | Admitting: Internal Medicine

## 2023-12-20 DIAGNOSIS — C44712 Basal cell carcinoma of skin of right lower limb, including hip: Secondary | ICD-10-CM | POA: Diagnosis not present

## 2023-12-20 DIAGNOSIS — L929 Granulomatous disorder of the skin and subcutaneous tissue, unspecified: Secondary | ICD-10-CM | POA: Diagnosis not present

## 2023-12-26 ENCOUNTER — Ambulatory Visit

## 2023-12-26 DIAGNOSIS — T63441D Toxic effect of venom of bees, accidental (unintentional), subsequent encounter: Secondary | ICD-10-CM | POA: Diagnosis not present

## 2024-01-30 DIAGNOSIS — Z1231 Encounter for screening mammogram for malignant neoplasm of breast: Secondary | ICD-10-CM | POA: Diagnosis not present

## 2024-01-30 LAB — HM MAMMOGRAPHY

## 2024-02-04 ENCOUNTER — Encounter: Payer: Self-pay | Admitting: Internal Medicine

## 2024-02-05 DIAGNOSIS — H04123 Dry eye syndrome of bilateral lacrimal glands: Secondary | ICD-10-CM | POA: Diagnosis not present

## 2024-02-05 DIAGNOSIS — H40023 Open angle with borderline findings, high risk, bilateral: Secondary | ICD-10-CM | POA: Diagnosis not present

## 2024-02-05 DIAGNOSIS — H532 Diplopia: Secondary | ICD-10-CM | POA: Diagnosis not present

## 2024-02-05 DIAGNOSIS — H2513 Age-related nuclear cataract, bilateral: Secondary | ICD-10-CM | POA: Diagnosis not present

## 2024-02-05 DIAGNOSIS — H5022 Vertical strabismus, left eye: Secondary | ICD-10-CM | POA: Diagnosis not present

## 2024-02-05 DIAGNOSIS — H43813 Vitreous degeneration, bilateral: Secondary | ICD-10-CM | POA: Diagnosis not present

## 2024-02-06 ENCOUNTER — Ambulatory Visit

## 2024-02-06 DIAGNOSIS — T63441D Toxic effect of venom of bees, accidental (unintentional), subsequent encounter: Secondary | ICD-10-CM | POA: Diagnosis not present

## 2024-02-06 NOTE — Patient Instructions (Incomplete)
 Bee sting reactions: Continue to avoid bee stings and fire ant. Continue to think about starting injections directed towards fire ant. Let us  know if you are interested in starting. Continue injections. Lifelong therapy is recommended. Change frequency to every 8 weeks If you are stung and have any symptoms which may include breathing issues, throat closure, significant swelling, whole body hives, severe diarrhea and vomiting, lightheadedness then inject epinephrine  and seek immediate medical care afterwards. Emergency action plan in place.  Have a medical alert bracelet - for the stinging insects.   Environmental allergies 2021 skin testing positive to tree pollen and dust mites.  Continue environmental control measures as below. May use over the counter antihistamines such as Zyrtec (cetirizine), Claritin (loratadine), Allegra (fexofenadine), or Xyzal (levocetirizine) daily as needed.  Contact dermatitis/chronic pruritus:  Avoid products containing thimerosol and balsam of [place]u Keep skin hydrated.  Follow up : 1 year, sooner if needed It was a pleasure seeing you again in clinic today! Thank you for allowing me to participate in your care.

## 2024-02-07 ENCOUNTER — Ambulatory Visit: Admitting: Family

## 2024-02-07 ENCOUNTER — Encounter: Payer: Self-pay | Admitting: Family

## 2024-02-07 VITALS — BP 144/62 | HR 62 | Resp 16 | Ht 60.5 in | Wt 132.0 lb

## 2024-02-07 DIAGNOSIS — L299 Pruritus, unspecified: Secondary | ICD-10-CM | POA: Diagnosis not present

## 2024-02-07 DIAGNOSIS — J302 Other seasonal allergic rhinitis: Secondary | ICD-10-CM | POA: Diagnosis not present

## 2024-02-07 DIAGNOSIS — L2389 Allergic contact dermatitis due to other agents: Secondary | ICD-10-CM

## 2024-02-07 DIAGNOSIS — T63444D Toxic effect of venom of bees, undetermined, subsequent encounter: Secondary | ICD-10-CM

## 2024-02-07 DIAGNOSIS — J3089 Other allergic rhinitis: Secondary | ICD-10-CM | POA: Diagnosis not present

## 2024-02-07 NOTE — Progress Notes (Signed)
 [address] [place] [address] Dept: [phone]  FOLLOW UP NOTE  Patient ID: [name], female    DOB: 22-Aug-1945  Age: 78 y.o. MRN: [id number] Date of Office Visit: 02/07/2024  Assessment  Chief Complaint: Allergies  HPI [name] is a 78 year old female who presents today for follow-up of bee sting reactions, environmental allergies, and chronic dermatitis/chronic pruritus.  She was last seen on September 22, 2022 by Dr. Marinda.  She reports since her last office visit that she has been dealing with chronic skin cancer and over the past 2 months she has had squamous cell and basal cell carcinoma removed from her legs.  Bee sting: She reports that she has not had any bee stings since her last office visit, but is very cautious.  Her EpiPen  does not expire until May 2027.  She does mention that at times her venom injections itch a tiny bit, but not every time.  It is mild when it is itchy.  Her previous reaction with bee stings were anaphylactic in the form of itching, feeling lightheaded, and dizzy.  She did have significant prresyncope, hypotension and EMS was not able to obtain a pulse.  She reports during that time that she saw a bright light and could hear them saying they could not find a pulse.  She feels like at that time she was possibly stung by a yellowjacket.  She was given epinephrine , Solu-Medrol, and Benadryl.  Her symptoms resolved in an hour.  She has been on venom immunotherapy since 2021.The week of the reaction she had also previously been stung by another bee and an ant. Her fire ant was 13.70. Called and spoke with Saleha on the phone to discuss her interest in starting venom injections directed towards fire ant and she reports that she is not sure and would like to think about it,but is leaning towards not starting fire ant injections. Reminded her to have access to her epinephrine  auto injector device at all times.  Environmental allergies: She reports that she has more  symptoms in the spring and the fall.  She will mostly have rhinorrhea, but sometimes she will have nasal congestion.  She will use Claritin sometimes when her nose is runny.  She has not been treated for any sinus infections since we last saw her.  Contact dermatitis/chronic pruritus.  She continues to avoid products containing thimerosal and balsam of [place].  She does mention that her dermatologist has her lather on lotion and this really helps with the itching.  She does mention that she asked her dermatologist why her skin graft of the skin cancer removal was so red.  She was told by her surgeon  that is probably red due to the healing process.  Her surgery was approximately a month ago.   Drug Allergies:  Allergies  Allergen Reactions   Bee Venom Anaphylaxis    Wasp, yellow jacket, hornet   Promethazine  Hcl     Other reaction(s): Unknown   Thimerosal (Thiomersal) Itching   Moxifloxacin Palpitations    Review of Systems: Negative except as per HPI   Physical Exam: BP (!) 144/62   Pulse 62   Resp 16   Ht 5' 0.5 (1.537 m)   Wt 132 lb (59.9 kg)   SpO2 98%   BMI 25.36 kg/m    Physical Exam Constitutional:      Appearance: Normal appearance.  HENT:     Head: Normocephalic and atraumatic.  Comments: Pharynx normal, eyes normal, ears normal, nose normal    Right Ear: Tympanic membrane, ear canal and external ear normal.     Left Ear: Tympanic membrane, ear canal and external ear normal.     Nose: Nose normal.     Mouth/Throat:     Mouth: Mucous membranes are moist.     Pharynx: Oropharynx is clear.  Eyes:     Conjunctiva/sclera: Conjunctivae normal.  Cardiovascular:     Rate and Rhythm: Regular rhythm.     Heart sounds: Normal heart sounds.  Pulmonary:     Effort: Pulmonary effort is normal.     Breath sounds: Normal breath sounds.     Comments: Lungs clear to auscultation Musculoskeletal:     Cervical back: Neck supple.  Skin:    General: Skin is warm.   Neurological:     Mental Status: She is alert and oriented to person, place, and time.  Psychiatric:        Mood and Affect: Mood normal.        Behavior: Behavior normal.        Thought Content: Thought content normal.        Judgment: Judgment normal.     Diagnostics:  None  Assessment and Plan: 1. Toxic effect of venom of bees, undetermined intent, subsequent encounter   2. Seasonal and perennial allergic rhinitis   3. Allergic contact dermatitis due to other agents   4. Pruritus     No orders of the defined types were placed in this encounter.   Patient Instructions  Bee sting reactions: Continue to avoid bee stings and fire ant. Continue to think about starting injections directed towards fire ant. Let us  know if you are interested in starting. Continue injections. Lifelong therapy is recommended. Change frequency to every 8 weeks If you are stung and have any symptoms which may include breathing issues, throat closure, significant swelling, whole body hives, severe diarrhea and vomiting, lightheadedness then inject epinephrine  and seek immediate medical care afterwards. Emergency action plan in place.  Have a medical alert bracelet - for the stinging insects.   Environmental allergies 2021 skin testing positive to tree pollen and dust mites.  Continue environmental control measures as below. May use over the counter antihistamines such as Zyrtec (cetirizine), Claritin (loratadine), Allegra (fexofenadine), or Xyzal (levocetirizine) daily as needed.  Contact dermatitis/chronic pruritus:  Avoid products containing thimerosol and balsam of [place] Keep skin hydrated.  Follow up : 1 year, sooner if needed It was a pleasure seeing you again in clinic today! Thank you for allowing me to participate in your care.    Return in about 1 year (around 02/06/2025), or if symptoms worsen or fail to improve.    Thank you for the opportunity to care for this patient.  Please do  not hesitate to contact me with questions.  Wanda Craze, FNP [facility] 

## 2024-03-11 ENCOUNTER — Other Ambulatory Visit: Payer: Self-pay | Admitting: Internal Medicine

## 2024-03-18 ENCOUNTER — Other Ambulatory Visit (HOSPITAL_COMMUNITY): Payer: Self-pay

## 2024-03-18 ENCOUNTER — Telehealth: Payer: Self-pay

## 2024-03-18 NOTE — Telephone Encounter (Signed)
 Prolia  VOB initiated via [email or web address]  Next Prolia  inj DUE: 04/17/24

## 2024-03-19 ENCOUNTER — Ambulatory Visit

## 2024-03-19 ENCOUNTER — Other Ambulatory Visit (HOSPITAL_COMMUNITY): Payer: Self-pay

## 2024-03-19 DIAGNOSIS — T63441D Toxic effect of venom of bees, accidental (unintentional), subsequent encounter: Secondary | ICD-10-CM

## 2024-03-19 NOTE — Telephone Encounter (Signed)
 [name]

## 2024-03-19 NOTE — Telephone Encounter (Signed)
 Pt ready for scheduling for PROLIA  on or after : 04/17/24  Option# 1: Buy/Bill (Office supplied medication)  Out-of-pocket cost due at time of clinic visit: $377  Number of injection/visits approved: 2  Primary: [employer] Prolia  co-insurance: 20% Admin fee co-insurance: 20%  Secondary: --- Prolia  co-insurance:  Admin fee co-insurance:   Medical Benefit Details: Date Benefits were checked: 03/18/24 Deductible: NO/ Coinsurance: 20%/ Admin Fee: 20%  Prior Auth: APPROVED PA# [id number] Expiration Date: 09/10/23-09/09/24  # of doses approved: 2 ----------------------------------------------------------------------- Option# 2- Med Obtained from pharmacy:  Pharmacy benefit: Copay [phone] (Paid to pharmacy) Admin Fee: 20% (Pay at clinic)  Prior Auth: N/A PA# Expiration Date:   # of doses approved:   If patient wants fill through the pharmacy benefit please send prescription to: [facility], and include estimated need by date in rx notes. Pharmacy will ship medication directly to the office.  Patient NOT eligible for Prolia  Copay Card. Copay Card can make patient's cost as little as $25. Link to apply: [email or web address]://[email or web address]  ** This summary of benefits is an estimation of the patient's out-of-pocket cost. Exact cost may very based on individual plan coverage.

## 2024-03-25 ENCOUNTER — Telehealth: Payer: Self-pay | Admitting: *Deleted

## 2024-03-25 DIAGNOSIS — M810 Age-related osteoporosis without current pathological fracture: Secondary | ICD-10-CM

## 2024-03-25 NOTE — Telephone Encounter (Signed)
 Left message with husband for pt to call back to discuss cost and getting scheduled for her Prolia .  Cost is $377 and can be scheduled on or after 04/18/24.

## 2024-03-26 NOTE — Telephone Encounter (Unsigned)
 Copied from CRM #[id number]. Topic: Appointments - Scheduling Inquiry for Clinic >> Mar 26, 2024  9:09 AM Adelita E wrote: Reason for CRM: Patient calling back about scheduling her Prolia  injection. Patient questioning if a bone density would be needed first in order to see if the Prolia  is beneficial. Patient also curious if this injection is ordered through the [facility] to make it any cheaper. Please call back to discuss.

## 2024-03-27 NOTE — Telephone Encounter (Signed)
 Left detailed message that order for dexa scan has been put in and she can call to schedule.  Also with pharmacy it would be $844.32 through pharmacy.

## 2024-03-27 NOTE — Telephone Encounter (Signed)
 Spoke with pt and she stated she will get injection and dexa scan.

## 2024-03-27 NOTE — Addendum Note (Signed)
 Addended by: ESTELLE GILLIS D on: 03/27/2024 10:01 AM   Modules accepted: Orders

## 2024-04-02 ENCOUNTER — Other Ambulatory Visit: Payer: Self-pay | Admitting: Internal Medicine

## 2024-04-02 ENCOUNTER — Other Ambulatory Visit: Payer: Self-pay

## 2024-04-02 DIAGNOSIS — M810 Age-related osteoporosis without current pathological fracture: Secondary | ICD-10-CM

## 2024-04-03 ENCOUNTER — Other Ambulatory Visit: Payer: Self-pay

## 2024-04-03 NOTE — Progress Notes (Signed)
 Disenrolled - prolia  refill request denied. Medication will be buy-bill since prolia  is $377 on medical benefit and $844.32 on pharmacy benefit.

## 2024-04-08 ENCOUNTER — Other Ambulatory Visit: Payer: Self-pay | Admitting: Internal Medicine

## 2024-04-18 ENCOUNTER — Ambulatory Visit

## 2024-05-14 ENCOUNTER — Ambulatory Visit

## 2024-06-17 ENCOUNTER — Encounter: Admitting: Internal Medicine

## 2024-07-08 ENCOUNTER — Ambulatory Visit
